# Patient Record
Sex: Female | Born: 1980 | Race: White | Hispanic: No | Marital: Married | Smoking: Current some day smoker
Health system: Southern US, Community
[De-identification: ages and names within clinical notes are randomized; demographics above are authoritative.]

## PROBLEM LIST (undated history)

## (undated) ENCOUNTER — Inpatient Hospital Stay (HOSPITAL_COMMUNITY): Payer: Self-pay

## (undated) DIAGNOSIS — R32 Unspecified urinary incontinence: Secondary | ICD-10-CM

## (undated) DIAGNOSIS — L409 Psoriasis, unspecified: Secondary | ICD-10-CM

## (undated) DIAGNOSIS — Z8619 Personal history of other infectious and parasitic diseases: Secondary | ICD-10-CM

## (undated) DIAGNOSIS — R4586 Emotional lability: Secondary | ICD-10-CM

## (undated) DIAGNOSIS — R351 Nocturia: Secondary | ICD-10-CM

## (undated) DIAGNOSIS — M1990 Unspecified osteoarthritis, unspecified site: Secondary | ICD-10-CM

## (undated) DIAGNOSIS — S83241A Other tear of medial meniscus, current injury, right knee, initial encounter: Secondary | ICD-10-CM

## (undated) DIAGNOSIS — M4307 Spondylolysis, lumbosacral region: Secondary | ICD-10-CM

## (undated) DIAGNOSIS — Z87448 Personal history of other diseases of urinary system: Secondary | ICD-10-CM

## (undated) DIAGNOSIS — R519 Headache, unspecified: Secondary | ICD-10-CM

## (undated) DIAGNOSIS — E1142 Type 2 diabetes mellitus with diabetic polyneuropathy: Secondary | ICD-10-CM

## (undated) DIAGNOSIS — H4710 Unspecified papilledema: Secondary | ICD-10-CM

## (undated) DIAGNOSIS — D171 Benign lipomatous neoplasm of skin and subcutaneous tissue of trunk: Secondary | ICD-10-CM

## (undated) DIAGNOSIS — E119 Type 2 diabetes mellitus without complications: Secondary | ICD-10-CM

## (undated) DIAGNOSIS — K219 Gastro-esophageal reflux disease without esophagitis: Secondary | ICD-10-CM

## (undated) DIAGNOSIS — F419 Anxiety disorder, unspecified: Secondary | ICD-10-CM

## (undated) DIAGNOSIS — R1031 Right lower quadrant pain: Secondary | ICD-10-CM

## (undated) DIAGNOSIS — R51 Headache: Secondary | ICD-10-CM

## (undated) DIAGNOSIS — I10 Essential (primary) hypertension: Secondary | ICD-10-CM

## (undated) DIAGNOSIS — Z8614 Personal history of Methicillin resistant Staphylococcus aureus infection: Secondary | ICD-10-CM

## (undated) HISTORY — PX: TUBAL LIGATION: SHX77

## (undated) HISTORY — DX: Psoriasis, unspecified: L40.9

## (undated) HISTORY — PX: WISDOM TOOTH EXTRACTION: SHX21

## (undated) SURGERY — Surgical Case
Anesthesia: *Unknown

---
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
## 2001-06-08 ENCOUNTER — Encounter: Payer: Self-pay | Admitting: Emergency Medicine

## 2001-06-08 ENCOUNTER — Emergency Department (HOSPITAL_COMMUNITY): Admission: EM | Admit: 2001-06-08 | Discharge: 2001-06-08 | Payer: Self-pay | Admitting: Emergency Medicine

## 2001-06-09 ENCOUNTER — Encounter: Payer: Self-pay | Admitting: Emergency Medicine

## 2001-06-09 ENCOUNTER — Emergency Department (HOSPITAL_COMMUNITY): Admission: EM | Admit: 2001-06-09 | Discharge: 2001-06-09 | Payer: Self-pay | Admitting: *Deleted

## 2001-08-29 ENCOUNTER — Emergency Department (HOSPITAL_COMMUNITY): Admission: EM | Admit: 2001-08-29 | Discharge: 2001-08-29 | Payer: Self-pay | Admitting: Emergency Medicine

## 2002-03-12 ENCOUNTER — Encounter: Payer: Self-pay | Admitting: Emergency Medicine

## 2002-03-12 ENCOUNTER — Emergency Department (HOSPITAL_COMMUNITY): Admission: EM | Admit: 2002-03-12 | Discharge: 2002-03-12 | Payer: Self-pay | Admitting: Emergency Medicine

## 2002-05-26 ENCOUNTER — Emergency Department (HOSPITAL_COMMUNITY): Admission: EM | Admit: 2002-05-26 | Discharge: 2002-05-26 | Payer: Self-pay | Admitting: Emergency Medicine

## 2002-08-30 ENCOUNTER — Emergency Department (HOSPITAL_COMMUNITY): Admission: EM | Admit: 2002-08-30 | Discharge: 2002-08-30 | Payer: Self-pay | Admitting: Emergency Medicine

## 2002-08-30 ENCOUNTER — Encounter: Payer: Self-pay | Admitting: Emergency Medicine

## 2002-09-16 ENCOUNTER — Emergency Department (HOSPITAL_COMMUNITY): Admission: EM | Admit: 2002-09-16 | Discharge: 2002-09-16 | Payer: Self-pay | Admitting: Emergency Medicine

## 2002-11-24 ENCOUNTER — Emergency Department (HOSPITAL_COMMUNITY): Admission: EM | Admit: 2002-11-24 | Discharge: 2002-11-24 | Payer: Self-pay | Admitting: Emergency Medicine

## 2002-12-16 ENCOUNTER — Emergency Department (HOSPITAL_COMMUNITY): Admission: EM | Admit: 2002-12-16 | Discharge: 2002-12-17 | Payer: Self-pay | Admitting: Emergency Medicine

## 2002-12-17 ENCOUNTER — Encounter: Payer: Self-pay | Admitting: Emergency Medicine

## 2003-03-07 ENCOUNTER — Encounter: Payer: Self-pay | Admitting: Emergency Medicine

## 2003-03-07 ENCOUNTER — Emergency Department (HOSPITAL_COMMUNITY): Admission: EM | Admit: 2003-03-07 | Discharge: 2003-03-07 | Payer: Self-pay | Admitting: Emergency Medicine

## 2003-03-11 ENCOUNTER — Emergency Department (HOSPITAL_COMMUNITY): Admission: EM | Admit: 2003-03-11 | Discharge: 2003-03-11 | Payer: Self-pay | Admitting: Emergency Medicine

## 2004-04-27 ENCOUNTER — Other Ambulatory Visit: Admission: RE | Admit: 2004-04-27 | Discharge: 2004-04-27 | Payer: Self-pay | Admitting: Obstetrics and Gynecology

## 2004-04-28 ENCOUNTER — Encounter: Admission: RE | Admit: 2004-04-28 | Discharge: 2004-04-28 | Payer: Self-pay | Admitting: Obstetrics and Gynecology

## 2004-08-01 ENCOUNTER — Emergency Department (HOSPITAL_COMMUNITY): Admission: EM | Admit: 2004-08-01 | Discharge: 2004-08-02 | Payer: Self-pay | Admitting: Emergency Medicine

## 2004-08-11 ENCOUNTER — Emergency Department (HOSPITAL_COMMUNITY): Admission: EM | Admit: 2004-08-11 | Discharge: 2004-08-11 | Payer: Self-pay | Admitting: Emergency Medicine

## 2005-04-17 ENCOUNTER — Emergency Department (HOSPITAL_COMMUNITY): Admission: EM | Admit: 2005-04-17 | Discharge: 2005-04-17 | Payer: Self-pay | Admitting: Emergency Medicine

## 2005-09-17 ENCOUNTER — Other Ambulatory Visit: Admission: RE | Admit: 2005-09-17 | Discharge: 2005-09-17 | Payer: Self-pay | Admitting: Obstetrics and Gynecology

## 2005-12-10 ENCOUNTER — Inpatient Hospital Stay (HOSPITAL_COMMUNITY): Admission: AD | Admit: 2005-12-10 | Discharge: 2005-12-11 | Payer: Self-pay | Admitting: Family Medicine

## 2005-12-10 ENCOUNTER — Inpatient Hospital Stay (HOSPITAL_COMMUNITY): Admission: AD | Admit: 2005-12-10 | Discharge: 2005-12-10 | Payer: Self-pay | Admitting: *Deleted

## 2005-12-12 ENCOUNTER — Inpatient Hospital Stay (HOSPITAL_COMMUNITY): Admission: AD | Admit: 2005-12-12 | Discharge: 2005-12-12 | Payer: Self-pay | Admitting: Obstetrics and Gynecology

## 2006-02-12 ENCOUNTER — Emergency Department (HOSPITAL_COMMUNITY): Admission: EM | Admit: 2006-02-12 | Discharge: 2006-02-12 | Payer: Self-pay | Admitting: Family Medicine

## 2007-01-13 ENCOUNTER — Emergency Department (HOSPITAL_COMMUNITY): Admission: EM | Admit: 2007-01-13 | Discharge: 2007-01-14 | Payer: Self-pay | Admitting: Emergency Medicine

## 2007-04-04 ENCOUNTER — Emergency Department (HOSPITAL_COMMUNITY): Admission: EM | Admit: 2007-04-04 | Discharge: 2007-04-04 | Payer: Self-pay | Admitting: Emergency Medicine

## 2007-04-08 ENCOUNTER — Emergency Department (HOSPITAL_COMMUNITY): Admission: EM | Admit: 2007-04-08 | Discharge: 2007-04-08 | Payer: Self-pay | Admitting: *Deleted

## 2007-10-21 ENCOUNTER — Inpatient Hospital Stay (HOSPITAL_COMMUNITY): Admission: EM | Admit: 2007-10-21 | Discharge: 2007-10-23 | Payer: Self-pay | Admitting: Emergency Medicine

## 2007-11-09 ENCOUNTER — Inpatient Hospital Stay (HOSPITAL_COMMUNITY): Admission: RE | Admit: 2007-11-09 | Discharge: 2007-11-09 | Payer: Self-pay | Admitting: Family Medicine

## 2007-12-18 ENCOUNTER — Inpatient Hospital Stay (HOSPITAL_COMMUNITY): Admission: AD | Admit: 2007-12-18 | Discharge: 2007-12-18 | Payer: Self-pay | Admitting: Obstetrics & Gynecology

## 2007-12-20 ENCOUNTER — Ambulatory Visit (HOSPITAL_COMMUNITY): Admission: RE | Admit: 2007-12-20 | Discharge: 2007-12-20 | Payer: Self-pay | Admitting: Family Medicine

## 2007-12-21 ENCOUNTER — Ambulatory Visit (HOSPITAL_COMMUNITY): Admission: RE | Admit: 2007-12-21 | Discharge: 2007-12-21 | Payer: Self-pay | Admitting: Family Medicine

## 2008-01-18 ENCOUNTER — Ambulatory Visit (HOSPITAL_COMMUNITY): Admission: RE | Admit: 2008-01-18 | Discharge: 2008-01-18 | Payer: Self-pay | Admitting: Family Medicine

## 2008-02-01 ENCOUNTER — Ambulatory Visit (HOSPITAL_COMMUNITY): Admission: RE | Admit: 2008-02-01 | Discharge: 2008-02-01 | Payer: Self-pay | Admitting: Family Medicine

## 2008-03-14 ENCOUNTER — Ambulatory Visit (HOSPITAL_COMMUNITY): Admission: RE | Admit: 2008-03-14 | Discharge: 2008-03-14 | Payer: Self-pay | Admitting: Family Medicine

## 2008-03-30 ENCOUNTER — Inpatient Hospital Stay (HOSPITAL_COMMUNITY): Admission: AD | Admit: 2008-03-30 | Discharge: 2008-03-30 | Payer: Self-pay | Admitting: Obstetrics & Gynecology

## 2008-03-30 ENCOUNTER — Ambulatory Visit: Payer: Self-pay | Admitting: Advanced Practice Midwife

## 2008-04-15 ENCOUNTER — Ambulatory Visit (HOSPITAL_COMMUNITY): Admission: RE | Admit: 2008-04-15 | Discharge: 2008-04-15 | Payer: Self-pay | Admitting: Obstetrics & Gynecology

## 2008-05-29 ENCOUNTER — Inpatient Hospital Stay (HOSPITAL_COMMUNITY): Admission: AD | Admit: 2008-05-29 | Discharge: 2008-05-29 | Payer: Self-pay | Admitting: Family Medicine

## 2008-05-29 ENCOUNTER — Encounter: Payer: Self-pay | Admitting: Family Medicine

## 2008-05-29 ENCOUNTER — Ambulatory Visit: Payer: Self-pay | Admitting: Surgery

## 2008-06-21 ENCOUNTER — Ambulatory Visit: Payer: Self-pay | Admitting: Obstetrics & Gynecology

## 2008-06-21 ENCOUNTER — Inpatient Hospital Stay (HOSPITAL_COMMUNITY): Admission: AD | Admit: 2008-06-21 | Discharge: 2008-06-24 | Payer: Self-pay | Admitting: Family Medicine

## 2008-06-21 ENCOUNTER — Encounter: Payer: Self-pay | Admitting: Obstetrics & Gynecology

## 2008-07-08 ENCOUNTER — Inpatient Hospital Stay (HOSPITAL_COMMUNITY): Admission: EM | Admit: 2008-07-08 | Discharge: 2008-07-11 | Payer: Self-pay | Admitting: Emergency Medicine

## 2008-07-10 ENCOUNTER — Encounter (INDEPENDENT_AMBULATORY_CARE_PROVIDER_SITE_OTHER): Payer: Self-pay | Admitting: *Deleted

## 2008-07-10 HISTORY — PX: CHOLECYSTECTOMY: SHX55

## 2009-12-26 ENCOUNTER — Inpatient Hospital Stay (HOSPITAL_COMMUNITY): Admission: AD | Admit: 2009-12-26 | Discharge: 2009-12-26 | Payer: Self-pay | Admitting: Obstetrics & Gynecology

## 2009-12-28 ENCOUNTER — Ambulatory Visit (HOSPITAL_COMMUNITY): Admission: AD | Admit: 2009-12-28 | Discharge: 2009-12-28 | Payer: Self-pay | Admitting: Obstetrics and Gynecology

## 2010-01-05 ENCOUNTER — Ambulatory Visit (HOSPITAL_COMMUNITY): Admission: RE | Admit: 2010-01-05 | Discharge: 2010-01-05 | Payer: Self-pay | Admitting: Obstetrics and Gynecology

## 2010-02-17 ENCOUNTER — Inpatient Hospital Stay (HOSPITAL_COMMUNITY): Admission: AD | Admit: 2010-02-17 | Discharge: 2010-02-17 | Payer: Self-pay | Admitting: Obstetrics and Gynecology

## 2010-03-28 ENCOUNTER — Inpatient Hospital Stay (HOSPITAL_COMMUNITY): Admission: AD | Admit: 2010-03-28 | Discharge: 2010-03-28 | Payer: Self-pay | Admitting: Obstetrics and Gynecology

## 2010-07-04 ENCOUNTER — Inpatient Hospital Stay (HOSPITAL_COMMUNITY): Admission: AD | Admit: 2010-07-04 | Discharge: 2010-07-04 | Payer: Self-pay | Admitting: Obstetrics & Gynecology

## 2010-08-18 ENCOUNTER — Inpatient Hospital Stay (HOSPITAL_COMMUNITY): Admission: RE | Admit: 2010-08-18 | Discharge: 2010-08-21 | Payer: Self-pay | Admitting: Obstetrics and Gynecology

## 2011-01-13 LAB — COMPREHENSIVE METABOLIC PANEL
ALT: 12 U/L (ref 0–35)
AST: 16 U/L (ref 0–37)
Albumin: 2.4 g/dL — ABNORMAL LOW (ref 3.5–5.2)
Alkaline Phosphatase: 72 U/L (ref 39–117)
BUN: 4 mg/dL — ABNORMAL LOW (ref 6–23)
CO2: 21 mEq/L (ref 19–32)
Calcium: 7.8 mg/dL — ABNORMAL LOW (ref 8.4–10.5)
Chloride: 106 mEq/L (ref 96–112)
Creatinine, Ser: 0.39 mg/dL — ABNORMAL LOW (ref 0.4–1.2)
GFR calc Af Amer: >60 mL/min (ref >60)
GFR calc non Af Amer: >60 mL/min (ref >60)
Glucose, Bld: 85 mg/dL (ref 70–99)
Potassium: 3.5 mEq/L (ref 3.5–5.1)
Sodium: 132 mEq/L — ABNORMAL LOW (ref 135–145)
Total Bilirubin: 0.2 mg/dL — ABNORMAL LOW (ref 0.3–1.2)
Total Protein: 5.3 g/dL — ABNORMAL LOW (ref 6.0–8.3)

## 2011-01-13 LAB — CBC
HCT: 25.3 % — ABNORMAL LOW (ref 36.0–46.0)
HCT: 29.4 % — ABNORMAL LOW (ref 36.0–46.0)
Hemoglobin: 8.5 g/dL — ABNORMAL LOW (ref 12.0–15.0)
Hemoglobin: 9.8 g/dL — ABNORMAL LOW (ref 12.0–15.0)
MCH: 28.7 pg (ref 26.0–34.0)
MCH: 29.1 pg (ref 26.0–34.0)
MCHC: 33.2 g/dL (ref 30.0–36.0)
MCHC: 33.4 g/dL (ref 30.0–36.0)
MCV: 86.5 fL (ref 78.0–100.0)
MCV: 87.2 fL (ref 78.0–100.0)
Platelets: 225 10*3/uL (ref 150–400)
Platelets: 236 10*3/uL (ref 150–400)
RBC: 2.9 MIL/uL — ABNORMAL LOW (ref 3.87–5.11)
RBC: 3.4 MIL/uL — ABNORMAL LOW (ref 3.87–5.11)
RDW: 14.8 % (ref 11.5–15.5)
RDW: 15.4 % (ref 11.5–15.5)
WBC: 11.1 10*3/uL — ABNORMAL HIGH (ref 4.0–10.5)
WBC: 14.8 10*3/uL — ABNORMAL HIGH (ref 4.0–10.5)

## 2011-01-13 LAB — URIC ACID: Uric Acid, Serum: 3.7 mg/dL (ref 2.4–7.0)

## 2011-01-13 LAB — LACTATE DEHYDROGENASE: LDH: 100 U/L (ref 94–250)

## 2011-01-14 LAB — SURGICAL PCR SCREEN
MRSA, PCR: NEGATIVE
Staphylococcus aureus: NEGATIVE

## 2011-01-14 LAB — CBC
HCT: 31.2 % — ABNORMAL LOW (ref 36.0–46.0)
Hemoglobin: 10.6 g/dL — ABNORMAL LOW (ref 12.0–15.0)
MCH: 29.4 pg (ref 26.0–34.0)
MCHC: 33.9 g/dL (ref 30.0–36.0)
MCV: 86.8 fL (ref 78.0–100.0)
Platelets: 254 10*3/uL (ref 150–400)
RBC: 3.59 MIL/uL — ABNORMAL LOW (ref 3.87–5.11)
RDW: 14.9 % (ref 11.5–15.5)
WBC: 10.1 10*3/uL (ref 4.0–10.5)

## 2011-01-14 LAB — RPR: RPR Ser Ql: NONREACTIVE

## 2011-01-18 LAB — URINALYSIS, ROUTINE W REFLEX MICROSCOPIC
Bilirubin Urine: NEGATIVE
Glucose, UA: NEGATIVE mg/dL
Hgb urine dipstick: NEGATIVE
Ketones, ur: NEGATIVE mg/dL
Nitrite: NEGATIVE
Protein, ur: NEGATIVE mg/dL
Specific Gravity, Urine: >1.03 — ABNORMAL HIGH (ref 1.005–1.030)
Urobilinogen, UA: 0.2 mg/dL (ref 0.0–1.0)
pH: 5.5 (ref 5.0–8.0)

## 2011-01-18 LAB — WET PREP, GENITAL
Clue Cells Wet Prep HPF POC: NONE SEEN
Trich, Wet Prep: NONE SEEN
Yeast Wet Prep HPF POC: NONE SEEN

## 2011-01-19 LAB — URINALYSIS, ROUTINE W REFLEX MICROSCOPIC
Bilirubin Urine: NEGATIVE
Glucose, UA: NEGATIVE mg/dL
Hgb urine dipstick: NEGATIVE
Ketones, ur: NEGATIVE mg/dL
Nitrite: NEGATIVE
Protein, ur: NEGATIVE mg/dL
Specific Gravity, Urine: 1.025 (ref 1.005–1.030)
Urobilinogen, UA: 0.2 mg/dL (ref 0.0–1.0)
pH: 6 (ref 5.0–8.0)

## 2011-01-19 LAB — GC/CHLAMYDIA PROBE AMP, GENITAL
Chlamydia, DNA Probe: NEGATIVE
GC Probe Amp, Genital: NEGATIVE

## 2011-01-19 LAB — WET PREP, GENITAL
Clue Cells Wet Prep HPF POC: NONE SEEN
Trich, Wet Prep: NONE SEEN
Yeast Wet Prep HPF POC: NONE SEEN

## 2011-01-19 LAB — CBC
HCT: 38.1 % (ref 36.0–46.0)
Hemoglobin: 12.8 g/dL (ref 12.0–15.0)
MCHC: 33.7 g/dL (ref 30.0–36.0)
MCV: 88.1 fL (ref 78.0–100.0)
Platelets: 246 10*3/uL (ref 150–400)
RBC: 4.32 MIL/uL (ref 3.87–5.11)
RDW: 14.3 % (ref 11.5–15.5)
WBC: 10.5 10*3/uL (ref 4.0–10.5)

## 2011-01-19 LAB — COMPREHENSIVE METABOLIC PANEL
ALT: 15 U/L (ref 0–35)
AST: 17 U/L (ref 0–37)
Albumin: 3.5 g/dL (ref 3.5–5.2)
Alkaline Phosphatase: 34 U/L — ABNORMAL LOW (ref 39–117)
BUN: 3 mg/dL — ABNORMAL LOW (ref 6–23)
CO2: 23 mEq/L (ref 19–32)
Calcium: 9.2 mg/dL (ref 8.4–10.5)
Chloride: 104 mEq/L (ref 96–112)
Creatinine, Ser: 0.4 mg/dL (ref 0.4–1.2)
GFR calc Af Amer: >60 mL/min (ref >60)
GFR calc non Af Amer: >60 mL/min (ref >60)
Glucose, Bld: 83 mg/dL (ref 70–99)
Potassium: 3.7 mEq/L (ref 3.5–5.1)
Sodium: 135 mEq/L (ref 135–145)
Total Bilirubin: 0.3 mg/dL (ref 0.3–1.2)
Total Protein: 6.6 g/dL (ref 6.0–8.3)

## 2011-01-19 LAB — URINE MICROSCOPIC-ADD ON

## 2011-01-19 LAB — URINE CULTURE

## 2011-01-20 LAB — URINALYSIS, ROUTINE W REFLEX MICROSCOPIC
Bilirubin Urine: NEGATIVE
Glucose, UA: NEGATIVE mg/dL
Hgb urine dipstick: NEGATIVE
Ketones, ur: NEGATIVE mg/dL
Nitrite: NEGATIVE
Protein, ur: NEGATIVE mg/dL
Specific Gravity, Urine: 1.015 (ref 1.005–1.030)
Urobilinogen, UA: 0.2 mg/dL (ref 0.0–1.0)
pH: 7.5 (ref 5.0–8.0)

## 2011-01-20 LAB — CBC
HCT: 42.5 % (ref 36.0–46.0)
Hemoglobin: 14.2 g/dL (ref 12.0–15.0)
MCHC: 33.3 g/dL (ref 30.0–36.0)
MCV: 86.7 fL (ref 78.0–100.0)
Platelets: 278 10*3/uL (ref 150–400)
RBC: 4.91 MIL/uL (ref 3.87–5.11)
RDW: 14.1 % (ref 11.5–15.5)
WBC: 11.9 10*3/uL — ABNORMAL HIGH (ref 4.0–10.5)

## 2011-01-20 LAB — HCG, QUANTITATIVE, PREGNANCY
hCG, Beta Chain, Quant, S: 5267 m[IU]/mL — ABNORMAL HIGH (ref <5)
hCG, Beta Chain, Quant, S: 8256 m[IU]/mL — ABNORMAL HIGH (ref <5)

## 2011-01-20 LAB — WET PREP, GENITAL
Clue Cells Wet Prep HPF POC: NONE SEEN
Trich, Wet Prep: NONE SEEN
Yeast Wet Prep HPF POC: NONE SEEN

## 2011-01-20 LAB — GC/CHLAMYDIA PROBE AMP, GENITAL
Chlamydia, DNA Probe: NEGATIVE
GC Probe Amp, Genital: NEGATIVE

## 2011-01-20 LAB — POCT PREGNANCY, URINE: Preg Test, Ur: POSITIVE

## 2011-03-16 NOTE — Discharge Summary (Signed)
[name], [name]                ACCOUNT NO.:  [id number]   MEDICAL RECORD NO.:  [id number]          PATIENT TYPE:  INP   LOCATION:  5122                         FACILITY:  [facility]   PHYSICIAN:  Alfonse Ras, MD   DATE OF BIRTH:  03-01-1981   DATE OF ADMISSION:  07/08/2008  DATE OF DISCHARGE:  07/11/2008                               DISCHARGE SUMMARY   ADMITTING PHYSICIAN:  Wilmon Arms. Corliss Skains, MD   DISCHARGING PHYSICIAN:  Alfonse Ras, MD   PROCEDURES:  On July 09, 2008, the patient had an endoscopic  retrograde cholangiopancreatography done by Dr. Ewing Schlein where a stent was  placed.  On July 10, 2008, Dr. Colin Benton did a laparoscopic  cholecystectomy, and then on July 11, 2008, the patient had another  endoscopic retrograde cholangiopancreatography done by Dr. Matthias Hughs where  the pancreatic stent was removed.   CONSULTANTS:  [facility], Petra Kuba, MD/Robert Buccini,  MD   REASON FOR ADMISSION:  [name] is a 30 year old female who is  currently 3 weeks status post C-section on June 21, 2008.  Apparently,  this morning, she woke up with pain in her epigastric region and  radiating to her back.  At this time, she denies any nausea or vomiting.  However, she states that she has had diarrhea since the C-section.  Otherwise, she states that she does not have any fever; however, she  does state that she has had a similar episode like this in December  2008, for which she was evaluated by Dr. Zachery Dakins and at that time, had  a negative HIDA scan.  At this time, we were called to see the patient  in the emergency department for acute cholecystitis.  Please see  admitting history and physical for further details.   ADMITTING DIAGNOSES:  1. Acute cholecystitis.  2. Status post recent cesarean section.   HOSPITAL COURSE:  On hospital day #1, the patient was feeling okay with  less pain.  At this time, she was currently NPO.  Her laboratory values  at  this time had increased from the time she was in the ER with her AST,  ALT, and alkaline phosphatase increasing as well as her total bilirubin,  which had increased from 0.5 to 1.5.  At this time, there was a concern  that the patient may have a stone in her duct since her ultrasound of  the abdomen the night prior did show a somewhat dilated common bile  duct.  Therefore, at this time, we called [facility] to  assess the patient and take her for an ERCP prior to taking the  gallbladder out.  This was done on the same day, and no stone was found;  however, they did place a stent just in case.  On the following day, the  patient was taken down for a laparoscopic cholecystectomy, which she  tolerated well.  That night, she was tolerating her diet; however, she  was made NPO after 6 a.m. on postoperative day #1 for another endoscopy  to remove pancreatic stent.  This ERCP was completed with no  problems,  and the patient tolerated the procedure well.  Otherwise, the patient  was stable for discharge once she was alert and once again tolerating a  regular diet.   DISCHARGE DIAGNOSES:  1. Acute cholecystitis.  2. Cholelithiasis.  3. Status post endoscopic retrograde cholangiopancreatography with      stent placement.  4. Status post endoscopic retrograde cholangiopancreatography with      stent removal.  5. Status post laparoscopic cholecystectomy.  6. Status post cesarean section.   DISCHARGE MEDICATIONS:  The patient is informed that she can resume all  prehospital medications, which include Tums p.r.n.  Otherwise, she is  given a prescription for Percocet 1-2 q.4 h. as needed for pain.  She is  also informed that she is to not take any NSAIDs, Goody's Powders,  aspirin, Motrin, Advil orally for the next 2 weeks status post her ERCP.   DISCHARGE INSTRUCTIONS:  The patient is informed that she may return to  work in 2 weeks.  She is also informed that she needs to resume a  low-  fat diet.  Otherwise, she is told to increase her activity slowly, and  she may walk up steps.  She also may shower; however, she is not to  bathe for the next 2 weeks, and she is also not to lift anything greater  than 15 pounds for the next 2 weeks.  Otherwise, she may remove her tape  and her gauze tomorrow from her incision.  She is to call [facility] office if she develops fever greater than 101.5, new or  worsening abdominal pain, or redness or pus-like drainage from her  incision.  She is also informed to discard any of her breast milk while  taking narcotic pain medicines.  Also, as previously stated above, per  Gastroenterology, she is not to take aspirin, Goody's Powders, BC  Powders, Advil, Aleve, ibuprofen, or Motrin for the next 2 weeks.  Otherwise, she is told to return to the [facility] on July 23, 2008, at 2:30 for a followup appointment from a  laparoscopic cholecystectomy.  Otherwise, she is to follow up with Dr.  Ewing Schlein or Dr. Matthias Hughs as needed if she continues to have the following  problems.      Letha Cape, PA      Alfonse Ras, MD  Electronically Signed    KEO/MEDQ  D:  07/11/2008  T:  07/12/2008  Job:  [id number]   cc:   Bernette Redbird, M.D.  Petra Kuba, M.D.

## 2011-03-16 NOTE — H&P (Signed)
[name]                ACCOUNT NO.:  [id number]   MEDICAL RECORD NO.:  [id number]          PATIENT TYPE:  INP   LOCATION:  1302                         FACILITY:  [facility]   PHYSICIAN:  Della Goo, M.D. DATE OF BIRTH:  [birth year]   DATE OF ADMISSION:  10/20/2007  DATE OF DISCHARGE:                              HISTORY & PHYSICAL   PRIMARY CARE PHYSICIAN:  Unassigned.   CHIEF COMPLAINT:  Abdominal pain.   HISTORY OF PRESENT ILLNESS:  This is a 30 year old female who presents  to the emergency department with complaints of 2 days of right upper  quadrant abdominal pain and nausea and vomiting which started in the  past 24 hours.  The patient reports not being able to hold down any p.o.  liquids or foods..  She denies having any hematemesis, and reports that  her emesis has been clear to white and not bilious in description.  She  denies having any fevers or chills.  Her last BM was 1 day ago.  She  denies having any diarrhea or constipation.  The pain has been in the  right upper quadrant area, and she rated the pain as being an 8/10 at  the worst.  The pain has been intermittent and crampy.  She denies  having any previous similar episodes to this.   PAST MEDICAL HISTORY:  Past history of asthma.   PAST SURGICAL HISTORY:  None.   MEDICATIONS:  None at this time.   ALLERGIES:  PENICILLIN.   SOCIAL HISTORY:  The patient works at [employer].  She is a smoker.  She is a nondrinker.  She denies any illicit drug usage.   FAMILY HISTORY:  No coronary artery disease, hypertension, diabetes, or  cancer that she knows of.   REVIEW OF SYSTEMS:  Pertinents are mentioned above.  The patient has had  no fevers, chills, chest pain, or shortness of breath.   PHYSICAL EXAMINATION FINDINGS:  GENERAL:  This is a 30 year old  overweight female in discomfort but no acute distress.  VITAL SIGNS:  Temperature 98.8, blood pressure 151/79, heart rate 102,  respirations 18, and  O2 saturations 98% on room air.  HEENT:  Normocephalic, atraumatic.  Pupils equally round, reactive to  light.  There is no scleral icterus.  Funduscopic benign.  Oropharynx is  clear.  NECK:  Supple.  Full range of motion.  No thyromegaly, adenopathy, or  jugular venous distention.  CARDIOVASCULAR:  Mild tachycardia.  No murmurs, gallops, or rubs.  LUNGS:  Clear to auscultation bilaterally.  ABDOMEN:  Positive bowel sounds.  Soft, nontender, nondistended.  EXTREMITIES:  Without cyanosis, clubbing, or edema.  NEUROLOGIC:  Alert and oriented x3.  There are no focal deficits.   LABORATORY STUDIES:  White blood cell count 11, hemoglobin 13.3,  hematocrit 38.8, MCV 82.6, platelets 314, neutrophils 66%, lymphocytes  24%.  Chemistry with a sodium of 139, potassium 3.8, chloride 107, CO2  26, BUN 13, creatinine 0.56, and glucose 115, total bilirubin 0.5,  alkaline phosphatase 56, AST 21, ALT 26.  Urine HCG negative.  Urinalysis negative, specific gravity 1.031.  Lipase 21.  Abdominal  ultrasound study performed and negative.  The findings reveal increased  echogenicity, without focal mass or intrahepatic ductal dilatation,  normal gallbladder, normal intrahepatic inferior vena cava, spleen,  kidneys, and abdominal aorta.  Pancreas was obscured by bowel gas.  The  impression was fatty liver, and otherwise in no acute findings seen.   ASSESSMENT:  A 30 year old female being admitted with:  1. Right upper quadrant abdominal pain.  2. Nausea and vomiting.  3. Tobacco abuse.  4. Fatty liver.   PLAN:  The patient will be placed on a clear liquid diet, and IV fluids  have been ordered.  Antiemetic therapy has been ordered as needed along  with pain control therapy.  A nicotine patch has also been ordered, and  the patient will be placed on DVT and GI prophylaxis.      Della Goo, M.D.  Electronically Signed     HJ/MEDQ  D:  10/21/2007  T:  10/23/2007  Job:  [id number]

## 2011-03-16 NOTE — Op Note (Signed)
NAMEWEN, [name]                ACCOUNT NO.:  [id number]   MEDICAL RECORD NO.:  [id number]          PATIENT TYPE:  INP   LOCATION:  5122                         FACILITY:  [facility]   PHYSICIAN:  Alfonse Ras, MD   DATE OF BIRTH:  [birth year]   DATE OF PROCEDURE:  07/10/2008  DATE OF DISCHARGE:                               OPERATIVE REPORT   PREOPERATIVE DIAGNOSIS:  Acute cholecystitis, status post endoscopic  retrograde cholangiopancreatography.   POSTOPERATIVE DIAGNOSIS:  Acute cholecystitis, status post endoscopic  retrograde cholangiopancreatography.   PROCEDURE:  Laparoscopic cholecystectomy.   SURGEON:  Alfonse Ras, MD   ASSISTANT:  Letha Cape, PA   ANESTHESIA:  General.   DESCRIPTION:  The patient was taken to the operating room and placed in  supine position.  After adequate general anesthesia was induced using  endotracheal tube, the abdomen was prepped and draped in a normal  sterile fashion.  Using a transverse infraumbilical incision, I dissect  down the fascia.  Fascia was opened vertically.  An 0 Vicryl purse-  string suture was placed on the fascial defect and Hasson trocar was  placed in the abdomen.  Pneumoperitoneum was obtained.  Under direct  vision, an 11 mm trocar was placed in subxiphoid region.  Two 5-mm  trocars were placed in the right abdomen.  Gallbladder was identified  and was quite distended, therefore it was aspirated.  It was retracted  cephalad.  Dissection at the neck of the gallbladder produced critical  view of the cystic duct this junction with common duct and gallbladder.  It was triply clipped and divided.  Cystic artery was identified with  critical view in a similar fashion, triply clipped and divided.  Gallbladder was taken off the gallbladder bed using Bovie electrocautery  and placed in an Endocatch bag.  It was removed through the umbilical  port.  Adequate hemostasis was assured.  The right upper quadrant was  copiously irrigated.  The infraumbilical fascial defect was closed with  0 Vicryl purse-string suture.  Pneumoperitoneum was released.  Trocars  were removed and skin incisions were closed with subcuticular 4-0  Monocryl.  Steri-Strips and sterile dressings were applied.  The patient  tolerated the procedure well and went to PACU in good condition.      Alfonse Ras, MD  Electronically Signed     KRE/MEDQ  D:  07/10/2008  T:  07/11/2008  Job:  [id number]

## 2011-03-16 NOTE — Op Note (Signed)
Courtney, Andrade                ACCOUNT NO.:  [id number]   MEDICAL RECORD NO.:  [id number]          PATIENT TYPE:  INP   LOCATION:  9372                          FACILITY:  [facility]   PHYSICIAN:  Lesly Dukes, M.D. DATE OF BIRTH:  09-13-1981   DATE OF PROCEDURE:  06/21/2008  DATE OF DISCHARGE:                               OPERATIVE REPORT   PREOPERATIVE DIAGNOSES:  65. A 30 year old gravida 6, para 0-0-5-0 at 36 and 3/7th weeks      gestational age with elevated blood pressures, oligohydramnios.  2. Breech presentation.   POSTOPERATIVE DIAGNOSES:  63. A 30 year old gravida 6, para 0-0-5-0 at 52 and 3/7th weeks      gestational age with elevated blood pressures, oligohydramnios.  2. Breech presentation.   PROCEDURE:  Primary low transverse C-section.   SURGEON:  Lesly Dukes, MD   ASSISTANT:  Odie Sera, DO   ANESTHESIA:  Spinal.   INDICATIONS FOR PROCEDURE:  [name] is a 30 year old gravida 6,  para 0-0-5-0 who at 72 and 3/7th weeks was noted to have elevated blood  pressures.  Upon further evaluation, she was noted to have  oligohydramnios as well as a breech presentation.  Due to the blood  pressures and oligo, she was advised to proceed with delivery.  Given  the presentation of baby, a cesarean section was recommended.  The  patient was counseled on the risks of cesarean section to include, but  not limited to bleeding, infection, as well as damage to the internal  organs.  The patient voiced understanding of the risks and agreed to  proceed with cesarean section.   DESCRIPTION OF PROCEDURE:  The patient was taken to the operating room  where a spinal anesthesia was administered.  A time-out was conducted.  The patient was prepped and draped in the usual sterile fashion and  placed in the dorsal left supine position.  A vertical Pfannenstiel  incision was made with the scalpel and extended down to the fascia.  The  fascia was incised and the incision  was carried laterally with the  scissors.  Peritoneum was dissected off the fascia both above and below  in the usual manner.  The rectus muscles were bluntly separated.  The  peritoneum was tented and entered with Metzenbaum scissors.  The  peritoneal incision was extended bluntly.  A bladder flap was developed  in the usual manner and a bladder blade was placed.  A transverse  incision was made in the uterus through the layers until membranes were  seen through the incision.  Membranes were ruptured bluntly to reveal  thinly meconium-stained fluid.  The infant was found to be in the frank  breech position and delivered in the usual manner with head in flexion  without difficulties.  The infant was bulb suctioned upon delivery and  the cord was clamped and cut.  The infant was handed to the NICU team.  The infant had a spontaneous cry and no gross abnormalities.  The  placenta was manually extracted and appeared intact with three-vessel  cord.  Blood gases as  well as cord blood was sent to Pathology.  The  uterine incision was closed in the usual manner with running suture of 0-  Vicryl.  Additional layer of closure was performed in the lower uterine  segment with 2-0 Vicryl.  Hemostasis was confirmed.  The attention was  then brought to the fascia.  The fascia was closed in the usual manner  with 0-Vicryl in a non-interlocking running fashion.  Hemostasis again  was confirmed as well as intact fascia without any defects.  Skin was  closed with staples and a pressure dressing was applied.   FINDINGS:  1. Viable female infant in a breech presentation.  2. Thinly meconium-stained fluid.  3. Grossly normal-appearing ovaries bilaterally.   SPECIMENS:  Placenta sent to Pathology.   ESTIMATED BLOOD LOSS:  800 mL.   COMPLICATIONS:  None.   The patient tolerated the procedure well and was sent to the PACU in  good condition. `      Odie Sera, DO  Electronically Signed      ______________________________  Lesly Dukes, M.D.    MC/MEDQ  D:  06/21/2008  T:  06/22/2008  Job:  [id number]

## 2011-03-16 NOTE — Op Note (Signed)
NAMEJAYDY, [name]                ACCOUNT NO.:  [id number]   MEDICAL RECORD NO.:  [id number]          PATIENT TYPE:  INP   LOCATION:  5122                         FACILITY:  [facility]   PHYSICIAN:  Petra Kuba, M.D.    DATE OF BIRTH:  27-Mar-1981   DATE OF PROCEDURE:  07/09/2008  DATE OF DISCHARGE:                               OPERATIVE REPORT   PROCEDURE:  Endoscopic retrograde cholangiopancreatography,  sphincterotomy, and balloon pull-through pancreatic stent placement.   INDICATIONS:  The patient with questionable common bile duct (CBD)  stones.  Consent was signed after risks, benefits, methods, and options  thoroughly discussed by both myself, our PA Clerance Lav and Dr. Matthias Hughs.   PROCEDURE:  The video side-viewing therapeutic duodenoscope was inserted  by indirect vision into the stomach.  There was no resistance and  passing through her esophagus, advanced through a normal pylorus into  the duodenum, and a normal-appearing ampulla was brought into view.  She  did have a tiny distal diverticula.  Using the triple-lumen  sphincterotome loaded with  Jagwire guide wire, initially we could only  get PD wire advancements.  After a prolonged effort, we did go ahead and  try the smaller sphincterotome loaded with the smaller wire but again  did not have any further success.  We did seem to have better wire  advancements using the larger wire and again tried one more time.  The  wire was advanced moderately far out the PD.  Through the first part of  this procedure, there was minimal PD injection and no CBD filling.  We  went ahead and put a 4-French 2-cm plastic stent in the PD and then  tried to recannulate with her, first on her belly but were unsuccessful.  We then rolled her on her side and was able to get deep selective  cannulation using the sphincterotome and the wire.  The CBD was normal.  No obvious stones were seen.  We went, there was no cystic duct filling.  The  intrahepatics were normal.  They were not overfilled.  We went ahead  and proceeded with a medium-sized sphincterotomy in the customary  fashion over the wire with adequate biliary drainage and good dye  drainage.  We were able to get the fully bowed sphincterotome in and out  of the duct.  We then went ahead and exchanged the 12-15 mm adjustable  balloon for the sphincterotome and proceeded with four balloon pull-  throughs.  No stones, debris, sludge, or signs of cholangitis were seen.  The balloon did pass fairly readily without resistance through the  sphincterotomy site.  We did do an occlusion cholangiogram and no  obvious residual stones were seen.  We elected to stop the procedure at  this junction.  We did leave the PD stent in place to help decrease the  chance of pancreatitis.  The patient tolerated the procedure adequately.  There was no obvious immediate complications.   ENDOSCOPIC DIAGNOSES:  1. Normal ampulla.  2. Tiny periampullary distal diverticulum.  3. Tiny pancreatic duct on a few injections to the few  wire      advancements status post 4-French 2-cm plastic stent placed into      the pancreatic duct (PD).  4. Normal common bile duct without obvious stone, no cystic duct      filling, normal intrahepatic.  5. Medium sized sphincterotomy.  6. Negative for balloon pull-throughs using the adjustable 12-15 mm      balloon inflated to 12 mm and negative occlusion cholangiogram      without any obvious stone.   PLAN:  See how she does clinically.  If no complications, laparoscopic  cholecystectomy tomorrow.  We will repeat labs.  We would recommend a  repeat intrahepatic cholangiogram just to confirm no residual stones and  then in 2-3 days repeat EGD to remove the PD stent and better evaluate  her esophagus and see if the dilation is needed.  We will leave that up  to Dr. Matthias Hughs and hold the aspirin and nonsteroidals for 14 days, hold  breast-feeding for 24  hours.           ______________________________  Petra Kuba, M.D.     MEM/MEDQ  D:  07/09/2008  T:  07/10/2008  Job:  [id number]

## 2011-03-16 NOTE — Discharge Summary (Signed)
NAME:  [name], [name]                ACCOUNT NO.:  [id number]   MEDICAL RECORD NO.:  [id number]          PATIENT TYPE:  INP   LOCATION:  1302                         [facility]ACILITY:  [facility]   PHYSICIAN:  Hind I Elsaid, MD      DATE OF BIRTH:  1980-12-16   DATE OF ADMISSION:  10/20/2007  DATE OF DISCHARGE:  10/23/2007                               DISCHARGE SUMMARY   DISCHARGE DIAGNOSES:  1. Nonspecific right upper quadrant pain, resolved.  2. Nausea and vomiting, resolved.  3. Fatty liver.  4. Asthma.   DISCHARGE MEDICATIONS:  None.   PROCEDURES:  1. Ultrasound of the abdomen:  Fatty liver, no acute findings      otherwise.  2. Chest x-ray:  Normal.  3. CT abdomen without contrast:  Contracted gallbladder.  There is      mild pericholecystic inflammatory changes and enhancement of the      gallbladder wall such as cholecystitis, recommend clinical      correlation.  A 2.6-cm rim enhancing cyst associated with the right      ovary, adnexa, and free pelvic fluid.  No evidence of appendicitis      or other inflammatory change.  No evidence of appendicitis or other      inflammatory change, no pelvic mass.  4. HIDA scan:  Normal  visualization of the liver, bile duct, and      gallbladder, and small bowel, gallbladder ejection fraction 88%,      normal ejection fraction.   HISTORY OF PRESENT ILLNESS:  Please review the history done by Dr.  Della Goo.  A 30 year old female presented to the ED with 2 days  of right upper quadrant abdominal pain, nausea and vomiting which has  happened in the past 24 hours.  The patient was unable to hold any  liquids.  She denies any hematemesis.  She reported that her emesis has  been clear to white.  Not bilious in  description.  An ultrasound was  done at that time which showed there was no evidence of acute abdomen.  Accordingly, the patient was placed on IV fluids and antiemetics and a  clear liquid diet.  During the hospitalization, nausea and  vomiting  completely resolved but the patient continued to complain of right upper  quadrant abdominal pain.  Accordingly a CT scan of the abdomen and  pelvis was done which suggested the possibility of acute cholecystitis.  Accordingly, surgery, Dr. Consuello Bossier, was consulted and a HIDA  scan was ordered.  HIDA scan was negative for any evidence of acute  cholecystitis and surgery did not recommend any surgical intervention at  this time.  At this time, the patient denies any right upper quadrant  abdominal pain and the nausea and vomiting are completely resolved, also  amylase and lipase were drawn which were completely negative for any  evidence of acute pancreatitis.  We thought the abdominal pain and  nausea and vomiting could be related to gastroenteritis and right upper  quadrant abdominal pain is nonspecific.  The patient was advised to  present to the hospital  if her abdominal pain returned back.   DISPOSITION:  At this time is that the patient is medically stable to be  discharged home and follow with her primary care physician.      Hind Bosie Helper, MD  Electronically Signed     HIE/MEDQ  D:  10/23/2007  T:  10/23/2007  Job:  [id number]

## 2011-03-16 NOTE — Op Note (Signed)
[name], [name]                ACCOUNT NO.:  [id number]   MEDICAL RECORD NO.:  [id number]          PATIENT TYPE:  INP   LOCATION:  5122                         FACILITY:  [facility]   PHYSICIAN:  Bernette Redbird, M.D.   DATE OF BIRTH:  10-11-1981   DATE OF PROCEDURE:  07/11/2008  DATE OF DISCHARGE:  07/11/2008                               OPERATIVE REPORT   PROCEDURE:  Upper endoscopy.   INDICATION:  A 30 year old female with significant dysphagia symptoms,  which are nonspecific in character.  She is 1 day status post  laparoscopic cholecystectomy and 2 days status post ERCP and  sphincterotomy with pancreatic ductal stent placement as far that  procedure, done because of elevated liver chemistries.  The purpose of  today's procedure is to evaluate for any anatomic abnormalities, which  may have led to her dysphagia symptoms, as well as for retrieval of the  pancreatic stent which has now served its purpose.   FINDINGS:  Stent successfully retrieved.  No esophageal stricture or  other explanation for dysphagia seen.   PROCEDURE:  The patient provided written consent for the procedure and  was brought in a fasted state to the endoscopy unit.   SEDATION:  Benadryl 50 mg, fentanyl 125 mcg, and Versed 12.5 mg IV  without arrhythmias or desaturation.   The Pentax adult video endoscope was passed under direct vision,  entering the esophagus without undue difficulty.  The larynx looked  grossly normal.   The esophagus was normal.  In particular, I did not see any esophageal  stricture, ring, or other abnormality.  I did not see any obvious  esophageal spasm or corkscrewing or other abnormalities and I did not  see any mucosal abnormalities such as reflux esophagitis, Barrett's  esophagus, nor any varices infection or neoplasia.  No hiatal hernia was  appreciated.   The stomach was entered.  It contained a moderate bilious residual,  which was suctioned up.  The gastric mucosa was a  little bit  erythematous, but not frankly pathologic.  No erosions, ulcers, polyps,  or masses were seen including retroflexed view of the cardia and the  pylorus, duodenal bulb, and second duodenum looked normal.   The pancreatic stent was readily visible protruding from the papilla in  the second portion of the duodenum.  It was easily grasped with a  polypectomy snare and pulled without resistance into the duodenal lumen  and from there successfully extracted out through the patient's mouth.   No biopsies were obtained.  The patient tolerated the procedure well and  there were no apparent complications.   IMPRESSION:  1. No source of dysphagia symptoms endoscopically evident.  2. Successful removal of pancreatic stent.   RECOMMENDATIONS:  Followup with me can be on a p.r.n. basis.  The  patient was advised that if her dysphagia symptoms continue to be a  problem in the future, we could do a barium swallow and if the  stricture, that was not endoscopically evident today, is demonstrated,  we could do a dilatation.   The patient will need followup of her liver  chemistries, but presumably  this can be done in the context of her postoperative surgical visits.  If they fail to normalize, we would need to see the patient back for  evaluation of chronic LFT elevation.           ______________________________  Bernette Redbird, M.D.     RB/MEDQ  D:  07/11/2008  T:  07/11/2008  Job:  [id number]   cc:   Alfonse Ras, MD

## 2011-03-16 NOTE — Consult Note (Signed)
[name], [name]                ACCOUNT NO.:  [id number]   MEDICAL RECORD NO.:  [id number]          PATIENT TYPE:  INP   LOCATION:  5122                         FACILITY:  [facility]   PHYSICIAN:  Bernette Redbird, M.D.   DATE OF BIRTH:  February 08, 1981   DATE OF CONSULTATION:  07/09/2008  DATE OF DISCHARGE:                                 CONSULTATION   We were asked to see [name] in consultation for a possible  common bile duct obstruction by Dr. Baruch Merl, [facility].   HISTORY OF PRESENT ILLNESS:  This is a 30 year old female who started  feeling back pain on July 08, 2008 at 6 a.m.  She said over time,  the pain migrated to the front of her chest and stayed there.  The pain  was constant.  She became diaphoretic and hot.  The pain was intense.  It was not relieved until she received pain medications in the ER.  She  also describes dysphagia for several years initially to breads, now to  all foods.  She does drink liquids to push the foods down.  Occasionally, she has to regurgitate her food.  She denies NSAIDs.  She  is currently breast feeding.   PAST MEDICAL HISTORY:  Significant for C-section approximately 2 weeks  ago.  She has also had a wisdom teeth removed.  She has a past history  of asthma, fatty liver, and acid reflux.  She is on no medications  currently.   She has an allergy to PENICILLIN.   REVIEW OF SYSTEMS:  Significant for soreness from her recent C-section  in her abdomen.   SOCIAL HISTORY:  Positive for tobacco, negative for alcohol.   FAMILY HISTORY:  She does not know her parents and does not know her  family history.   PHYSICAL EXAMINATION:  GENERAL:  She is alert and oriented in no  apparent distress.  She does have mild jaundice.  VITAL SIGNS:  Temperature is 98.1, pulse 70, respirations 18, blood  pressure is 142/85.  HEART:  Regular rate and rhythm.  LUNGS:  Clear to auscultation anteriorly.  ABDOMEN:  Soft.  She has  good bowel sounds.  She is tender throughout  partially again due to her recent C-section.   BMETs within normal limits.  LFTs show an AST of 27, ALT 104, alk phos  121, total bilirubin 1.5 that is up from 0.5 yesterday.  White count  7.3, platelets 239,000, hemoglobin is 11.9, hematocrit 35.9.  Ultrasound  done yesterday showed acute cholecystitis with a thickened gallbladder  wall and some pericholecystic fluid.  Her CBD was 8.1 mm.  An ultrasound  done in December 2008 had showed a normal gallbladder, normal CBD.  She  also had a normal HIDA scan at that time with a normal ejection  fraction.   ASSESSMENT:  Dr. Molly Maduro Buccini has seen and examined the patient,  collected her history, and reviewed her chart.  His impression is that  this is acute or possibly subacute cholecystitis.  She is 2-week  postpartum, currently breast feeding.  She does  have dysphagia.  Her  increasing LFTs could be cholecystitis; however, new biliary dilatation  implies probable obstruction.  We will proceed with ERCP Andrade at  approximately 1:30 by Dr. Vida Rigger.  Ashby Dawes of the exam as well as risks of bleeding, perforation,  sedation, and pancreatitis were reviewed with the patient with the aid  of a diagram.  The patient understands these risks and wishes to proceed  with the procedure.  Thank you very much for this consultation.      Stephani Police, PA    ______________________________  Bernette Redbird, M.D.    MLY/MEDQ  D:  07/09/2008  T:  07/10/2008  Job:  [id number]   cc:   Bernette Redbird, M.D.  Petra Kuba, M.D.  Alfonse Ras, MD

## 2011-03-19 NOTE — Discharge Summary (Signed)
[name], [name]                ACCOUNT NO.:  [id number]   MEDICAL RECORD NO.:  [id number]          PATIENT TYPE:  INP   LOCATION:  9145                          FACILITY:  [facility]   PHYSICIAN:  Ginger Carne, MD  DATE OF BIRTH:  02-Feb-1981   DATE OF ADMISSION:  06/21/2008  DATE OF DISCHARGE:  06/24/2008                               DISCHARGE SUMMARY   REASON FOR ADMISSION:  Onset of labor.   PROCEDURES PRENATAL:  1. Nonstress test.  2. Ultrasound.   PROCEDURE INTRAPARTUM:  1. Cesarean section, low cervical transverse.   PROCEDURE POSTPARTUM:  None.   COMPLICATIONS OPERATIVE AND POSTPARTUM:  None.   DISCHARGE DIAGNOSIS:  Term pregnancy, delivered.   DISCHARGE INFORMATION:  Discharge date, June 24, 2008.   ACTIVITY:  Pelvic rest x6 weeks.   DIET:  Routine.   MEDICATIONS:  1. Prenatal vitamin p.o. one daily during breast-feeding.  2. Colace 100 mg p.o. b.i.d. as needed for postpartum constipation.  3. Ibuprofen 600 mg p.o. q.48 h. as needed for postpartum pain.   STATUS:  Well.   INSTRUCTIONS:  Routine.  Discharged to home.  Follow up in 6 weeks at  the [facility].   NEWBORN DATA:  Female, weight 5 pounds 1 ounce.  Newborn discharged home  to mother.   LABORATORY DATA:  June 22, 2008:  Hemoglobin 13.1, hematocrit 39.5.   BRIEF HOSPITAL COURSE:  This is a 30 year old G6, P0-0-5-0, who  presented at 36.3 weeks' gestational age with onset of labor.  The  patient was admitted to labor and delivery and delivered via cesarean  section for a breech presentation.  She also had risk factors that  include elevated blood pressures and oligohydramnios.  A viable female  was delivered via primary low-transverse cesarean section on June 24, 2008 with Apgars of 8 and 9.  The patient was placed on magnesium for 24  hours post partum.  The patient did not desire any form contraceptions.  The  patient was given the choices of birth control pill,  Depo-Provera, IUD,  etc, but she denied all choices.  The patient is breast-feeding.  The  patient is discharged with prescription for Colace, prenatal vitamin,  and ibuprofen.  She is to follow up in 6 weeks at the [facility].  GBS negative, RPR nonreactive, rubella immune.      Angeline Slim, MD  Electronically Signed     ______________________________  Ginger Carne, MD    CT/MEDQ  D:  07/17/2008  T:  07/18/2008  Job:  [id number]

## 2011-07-22 LAB — GC/CHLAMYDIA PROBE AMP, GENITAL
Chlamydia, DNA Probe: NEGATIVE
GC Probe Amp, Genital: NEGATIVE

## 2011-07-22 LAB — WET PREP, GENITAL
Clue Cells Wet Prep HPF POC: NONE SEEN
Trich, Wet Prep: NONE SEEN
Yeast Wet Prep HPF POC: NONE SEEN

## 2011-07-22 LAB — POCT PREGNANCY, URINE
Operator id: 266701
Preg Test, Ur: POSITIVE

## 2011-07-22 LAB — URINALYSIS, ROUTINE W REFLEX MICROSCOPIC
Bilirubin Urine: NEGATIVE
Glucose, UA: NEGATIVE
Hgb urine dipstick: NEGATIVE
Ketones, ur: NEGATIVE
Nitrite: NEGATIVE
Protein, ur: NEGATIVE
Specific Gravity, Urine: 1.025
Urobilinogen, UA: 0.2
pH: 6

## 2011-07-22 LAB — HCG, QUANTITATIVE, PREGNANCY: hCG, Beta Chain, Quant, S: 13849 — ABNORMAL HIGH

## 2011-07-23 LAB — URINALYSIS, ROUTINE W REFLEX MICROSCOPIC
Bilirubin Urine: NEGATIVE
Glucose, UA: NEGATIVE
Hgb urine dipstick: NEGATIVE
Ketones, ur: NEGATIVE
Nitrite: NEGATIVE
Protein, ur: NEGATIVE
Specific Gravity, Urine: 1.02
Urobilinogen, UA: 0.2
pH: 8.5 — ABNORMAL HIGH

## 2011-07-23 LAB — INFLUENZA A+B VIRUS AG-DIRECT(RAPID)
Inflenza A Ag: NEGATIVE
Influenza B Ag: NEGATIVE

## 2011-07-23 LAB — STREP A DNA PROBE: Group A Strep Probe: NEGATIVE

## 2011-07-28 LAB — URINALYSIS, ROUTINE W REFLEX MICROSCOPIC
Bilirubin Urine: NEGATIVE
Glucose, UA: NEGATIVE
Hgb urine dipstick: NEGATIVE
Ketones, ur: NEGATIVE
Nitrite: NEGATIVE
Protein, ur: NEGATIVE
Specific Gravity, Urine: 1.02
Urobilinogen, UA: 0.2
pH: 6

## 2011-07-30 LAB — COMPREHENSIVE METABOLIC PANEL
ALT: 15
AST: 19
Albumin: 2.5 — ABNORMAL LOW
Alkaline Phosphatase: 94
BUN: 5 — ABNORMAL LOW
CO2: 22
Calcium: 8.1 — ABNORMAL LOW
Chloride: 107
Creatinine, Ser: 0.46
GFR calc Af Amer: >60
GFR calc non Af Amer: >60
Glucose, Bld: 119 — ABNORMAL HIGH
Potassium: 3.1 — ABNORMAL LOW
Sodium: 134 — ABNORMAL LOW
Total Bilirubin: 0.4
Total Protein: 5.7 — ABNORMAL LOW

## 2011-07-30 LAB — CBC
HCT: 32.6 — ABNORMAL LOW
Hemoglobin: 11.1 — ABNORMAL LOW
MCHC: 33.9
MCV: 86.9
Platelets: 278
RBC: 3.75 — ABNORMAL LOW
RDW: 14.7
WBC: 11.6 — ABNORMAL HIGH

## 2011-07-30 LAB — URINALYSIS, ROUTINE W REFLEX MICROSCOPIC
Bilirubin Urine: NEGATIVE
Glucose, UA: NEGATIVE
Hgb urine dipstick: NEGATIVE
Ketones, ur: NEGATIVE
Nitrite: NEGATIVE
Protein, ur: NEGATIVE
Specific Gravity, Urine: 1.015
Urobilinogen, UA: 0.2
pH: 6

## 2011-07-30 LAB — URIC ACID: Uric Acid, Serum: 3.8

## 2011-08-04 LAB — COMPREHENSIVE METABOLIC PANEL
ALT: 104 — ABNORMAL HIGH
ALT: 104 — ABNORMAL HIGH
ALT: 32
ALT: 99 — ABNORMAL HIGH
AST: 127 — ABNORMAL HIGH
AST: 40 — ABNORMAL HIGH
AST: 64 — ABNORMAL HIGH
AST: 76 — ABNORMAL HIGH
Albumin: 2.9 — ABNORMAL LOW
Albumin: 3 — ABNORMAL LOW
Albumin: 3 — ABNORMAL LOW
Albumin: 3.7
Alkaline Phosphatase: 121 — ABNORMAL HIGH
Alkaline Phosphatase: 132 — ABNORMAL HIGH
Alkaline Phosphatase: 156 — ABNORMAL HIGH
Alkaline Phosphatase: 83
BUN: 3 — ABNORMAL LOW
BUN: 3 — ABNORMAL LOW
BUN: 4 — ABNORMAL LOW
BUN: 7
CO2: 25
CO2: 25
CO2: 28
CO2: 29
Calcium: 8.5
Calcium: 8.5
Calcium: 8.7
Calcium: 8.8
Chloride: 102
Chloride: 103
Chloride: 107
Chloride: 107
Creatinine, Ser: 0.65
Creatinine, Ser: 0.67
Creatinine, Ser: 0.69
Creatinine, Ser: 0.73
GFR calc Af Amer: >60
GFR calc Af Amer: >60
GFR calc Af Amer: >60
GFR calc Af Amer: >60
GFR calc non Af Amer: >60
GFR calc non Af Amer: >60
GFR calc non Af Amer: >60
GFR calc non Af Amer: >60
Glucose, Bld: 102 — ABNORMAL HIGH
Glucose, Bld: 90
Glucose, Bld: 96
Glucose, Bld: 98
Potassium: 3.3 — ABNORMAL LOW
Potassium: 3.5
Potassium: 3.7
Potassium: 3.8
Sodium: 138
Sodium: 139
Sodium: 140
Sodium: 141
Total Bilirubin: 0.5
Total Bilirubin: 1
Total Bilirubin: 1.2
Total Bilirubin: 1.5 — ABNORMAL HIGH
Total Protein: 5.8 — ABNORMAL LOW
Total Protein: 5.8 — ABNORMAL LOW
Total Protein: 5.9 — ABNORMAL LOW
Total Protein: 6.5

## 2011-08-04 LAB — URINALYSIS, ROUTINE W REFLEX MICROSCOPIC
Bilirubin Urine: NEGATIVE
Glucose, UA: NEGATIVE
Ketones, ur: NEGATIVE
Nitrite: NEGATIVE
Protein, ur: NEGATIVE
Specific Gravity, Urine: 1.011
Urobilinogen, UA: 0.2
pH: 6

## 2011-08-04 LAB — TYPE AND SCREEN
ABO/RH(D): B POS
Antibody Screen: NEGATIVE

## 2011-08-04 LAB — CBC
HCT: 35 — ABNORMAL LOW
HCT: 35.9 — ABNORMAL LOW
HCT: 37.4
HCT: 40.8
Hemoglobin: 11.9 — ABNORMAL LOW
Hemoglobin: 11.9 — ABNORMAL LOW
Hemoglobin: 12.2
Hemoglobin: 13.5
MCHC: 32.8
MCHC: 33.1
MCHC: 33.2
MCHC: 34
MCV: 82.6
MCV: 84.2
MCV: 84.8
MCV: 85.1
Platelets: 238
Platelets: 239
Platelets: 242
Platelets: 277
RBC: 4.24
RBC: 4.26
RBC: 4.39
RBC: 4.81
RDW: 14.5
RDW: 14.6
RDW: 14.9
RDW: 14.9
WBC: 10.7 — ABNORMAL HIGH
WBC: 7.3
WBC: 8.1
WBC: 8.1

## 2011-08-04 LAB — DIFFERENTIAL
Basophils Absolute: 0.1
Basophils Relative: 1
Eosinophils Absolute: 0.6
Eosinophils Relative: 6 — ABNORMAL HIGH
Lymphocytes Relative: 23
Lymphs Abs: 2.4
Monocytes Absolute: 0.4
Monocytes Relative: 4
Neutro Abs: 7.2
Neutrophils Relative %: 67

## 2011-08-04 LAB — ABO/RH: ABO/RH(D): B POS

## 2011-08-04 LAB — POCT CARDIAC MARKERS
CKMB, poc: 1.3
Myoglobin, poc: 44.8
Troponin i, poc: <0.05

## 2011-08-04 LAB — URINE MICROSCOPIC-ADD ON

## 2011-08-04 LAB — LIPASE, BLOOD: Lipase: 20

## 2011-08-06 LAB — URINALYSIS, ROUTINE W REFLEX MICROSCOPIC
Bilirubin Urine: NEGATIVE
Glucose, UA: NEGATIVE
Hgb urine dipstick: NEGATIVE
Ketones, ur: NEGATIVE
Nitrite: NEGATIVE
Protein, ur: NEGATIVE
Specific Gravity, Urine: 1.031 — ABNORMAL HIGH
Urobilinogen, UA: 1
pH: 7

## 2011-08-06 LAB — COMPREHENSIVE METABOLIC PANEL
ALT: 20
ALT: 26
AST: 17
AST: 21
Albumin: 2.9 — ABNORMAL LOW
Albumin: 3.8
Alkaline Phosphatase: 45
Alkaline Phosphatase: 56
BUN: 13
BUN: 3 — ABNORMAL LOW
CO2: 23
CO2: 26
Calcium: 8.2 — ABNORMAL LOW
Calcium: 9.1
Chloride: 107
Chloride: 111
Creatinine, Ser: 0.53
Creatinine, Ser: 0.56
GFR calc Af Amer: >60
GFR calc Af Amer: >60
GFR calc non Af Amer: >60
GFR calc non Af Amer: >60
Glucose, Bld: 115 — ABNORMAL HIGH
Glucose, Bld: 124 — ABNORMAL HIGH
Potassium: 3.4 — ABNORMAL LOW
Potassium: 3.8
Sodium: 138
Sodium: 139
Total Bilirubin: 0.5
Total Bilirubin: 0.5
Total Protein: 5.4 — ABNORMAL LOW
Total Protein: 7.1

## 2011-08-06 LAB — BASIC METABOLIC PANEL
BUN: 4 — ABNORMAL LOW
CO2: 24
Calcium: 8.4
Chloride: 111
Creatinine, Ser: 0.59
GFR calc Af Amer: >60
GFR calc non Af Amer: >60
Glucose, Bld: 153 — ABNORMAL HIGH
Potassium: 4
Sodium: 139

## 2011-08-06 LAB — DIFFERENTIAL
Basophils Absolute: 0.1
Basophils Absolute: 0.1
Basophils Relative: 0
Basophils Relative: 1
Eosinophils Absolute: 0.2
Eosinophils Absolute: 0.4
Eosinophils Relative: 2
Eosinophils Relative: 3
Lymphocytes Relative: 24
Lymphocytes Relative: 25
Lymphs Abs: 2.6
Lymphs Abs: 3.6
Monocytes Absolute: 0.6
Monocytes Absolute: 0.6
Monocytes Relative: 4
Monocytes Relative: 6
Neutro Abs: 10.1 — ABNORMAL HIGH
Neutro Abs: 7.3
Neutrophils Relative %: 66
Neutrophils Relative %: 69

## 2011-08-06 LAB — CBC
HCT: 33.5 — ABNORMAL LOW
HCT: 34.9 — ABNORMAL LOW
HCT: 38.8
Hemoglobin: 11.7 — ABNORMAL LOW
Hemoglobin: 11.8 — ABNORMAL LOW
Hemoglobin: 13.3
MCHC: 33.8
MCHC: 34.3
MCHC: 34.8
MCV: 82.6
MCV: 83.1
MCV: 83.9
Platelets: 260
Platelets: 287
Platelets: 314
RBC: 4.03
RBC: 4.16
RBC: 4.69
RDW: 14.1
RDW: 14.3
RDW: 14.3
WBC: 11 — ABNORMAL HIGH
WBC: 13.3 — ABNORMAL HIGH
WBC: 14.6 — ABNORMAL HIGH

## 2011-08-06 LAB — LIPASE, BLOOD
Lipase: 16
Lipase: 19
Lipase: 21

## 2011-08-06 LAB — PREGNANCY, URINE: Preg Test, Ur: NEGATIVE

## 2011-08-06 LAB — AMYLASE: Amylase: 54

## 2011-08-19 LAB — RAPID STREP SCREEN (MED CTR MEBANE ONLY): Streptococcus, Group A Screen (Direct): NEGATIVE

## 2012-09-25 ENCOUNTER — Ambulatory Visit (INDEPENDENT_AMBULATORY_CARE_PROVIDER_SITE_OTHER): Payer: Self-pay | Admitting: Family Medicine

## 2012-09-25 ENCOUNTER — Encounter: Payer: Self-pay | Admitting: Family Medicine

## 2012-09-25 VITALS — BP 147/96 | HR 91 | Ht 66.0 in | Wt 197.0 lb

## 2012-09-25 DIAGNOSIS — I10 Essential (primary) hypertension: Secondary | ICD-10-CM

## 2012-09-25 DIAGNOSIS — E669 Obesity, unspecified: Secondary | ICD-10-CM

## 2012-09-25 DIAGNOSIS — R229 Localized swelling, mass and lump, unspecified: Secondary | ICD-10-CM

## 2012-09-25 DIAGNOSIS — Z8742 Personal history of other diseases of the female genital tract: Secondary | ICD-10-CM

## 2012-09-25 DIAGNOSIS — Z87898 Personal history of other specified conditions: Secondary | ICD-10-CM

## 2012-09-25 LAB — POCT GLYCOSYLATED HEMOGLOBIN (HGB A1C): Hemoglobin A1C: 5.9

## 2012-09-25 MED ORDER — AMLODIPINE BESYLATE 5 MG PO TABS: 5.0000 mg | ORAL_TABLET | Freq: Every day | ORAL | Status: DC

## 2012-09-25 NOTE — Patient Instructions (Addendum)
Once you qualify for the "orange card", I would like to check a cholesterol and a basic metabolic panel. For this, call the office to make a lab appointment. For the lab appointment, please come fasting (no food or drink after midnight).   For the blood pressure, I am going to start you on a blood pressure medicine called norvasc 5mg  daily. One of the side effects is lower leg swelling.  Please follow up in 2 weeks to see how your blood pressure is doing.   Also, you can come to the clinic for a pap smear, since this needs to be done as your routine screening test.

## 2012-09-26 ENCOUNTER — Telehealth: Payer: Self-pay | Admitting: *Deleted

## 2012-09-26 ENCOUNTER — Encounter: Payer: Self-pay | Admitting: Family Medicine

## 2012-09-26 DIAGNOSIS — Z87898 Personal history of other specified conditions: Secondary | ICD-10-CM | POA: Insufficient documentation

## 2012-09-26 DIAGNOSIS — E669 Obesity, unspecified: Secondary | ICD-10-CM | POA: Insufficient documentation

## 2012-09-26 DIAGNOSIS — I10 Essential (primary) hypertension: Secondary | ICD-10-CM | POA: Insufficient documentation

## 2012-09-26 DIAGNOSIS — R229 Localized swelling, mass and lump, unspecified: Secondary | ICD-10-CM | POA: Insufficient documentation

## 2012-09-26 NOTE — Assessment & Plan Note (Signed)
Elevated BP in office which correlates with reported readings at home. Would like to start medicine in office and do not have baseline labs. Will therefore start amlodipine 5mg  daily and re-evaluate BP in 2 weeks. Will also obtain CMP, fasting lipid when patient is able.

## 2012-09-26 NOTE — Telephone Encounter (Signed)
Called pt. Left message to call back. [name], [name]  A1C 5.9 %

## 2012-09-26 NOTE — Telephone Encounter (Signed)
Pt called back and info was given

## 2012-09-26 NOTE — Assessment & Plan Note (Signed)
Subcutaneous mass palpable on left flank, with tenderness. Mobile and non fixed. Likely lipoma, but cannot exclude other etiology. Will obtain ultrasound at next visit.

## 2012-09-26 NOTE — Assessment & Plan Note (Signed)
Patient concerned about diabetes. In context of obesity, will check A1c which was low at 5.9. Will address diet and exercise at future visits.

## 2012-09-26 NOTE — Progress Notes (Signed)
Patient ID: [name], female   DOB: [birth year], 31 y.o.   MRN: [id number]  Chief Complaint  Patient presents with  . new patient  . Hypertension   HPI [name] is a 31 y.o. female who presents to establish care. Concerns are the following: - HTN: doesn't have a h/o BP other than during pregnancy, but recently she has been checking her BP at home and it has been in the 140's/90's. She denies any shortness of breath. She does report some chest discomfort that occurs independently from exertion and occurs when she is stressed or anxious. The feeling lasts for 20 minutes and goes away once she calms down. This has been occuring for several months. She denies any nausea or diaphoresis when this occurs.   - concern about diabetes: reports that she feels shaky when she doesn't eat something for a long period of time and she is afraid she is diabetic. Eating makes it go away, it occurs every day. She also reports pins and needles in her hands and feet for many years, occuring more often recently.   - knot in left lower back: first noticed it as a finger nail size lump, about a year ago. More recently it has increased in size and is painful with palpation. She denies any similar knots anywhere else.    Past Medical History  Diagnosis Date  . Psoriasis   . Abnormal Pap smear     patient states she had "cervical cancer" at 31 yo for which she had removal of atypical cells.     Past Surgical History  Procedure Date  . Cholecystectomy 2009  . Cesarean section 2009, 2011    Family History  Problem Relation Age of Onset  . Drug abuse Mother   . Heart disease Maternal Grandmother   . Heart disease Maternal Grandfather     Social History History  Substance Use Topics  . Smoking status: Current Every Day Smoker -- 0.5 packs/day    Types: Cigarettes  . Smokeless tobacco: Not on file  . Alcohol Use: Not on file    Allergies  Allergen Reactions  . Penicillins     Current  Outpatient Prescriptions  Medication Sig Dispense Refill  . amLODipine (NORVASC) 5 MG tablet Take 1 tablet (5 mg total) by mouth daily.  30 tablet  3    Review of Systems Review of Systems Negative except per HPI Blood pressure 147/96, pulse 91, height 5\' 6"  (1.676 m), weight 197 lb (89.359 kg), last menstrual period 09/24/2012.  Physical Exam Physical Exam General appearance - alert, well appearing, mildly anxious appearing Nose - normal and patent, no erythema, discharge or polyps Mouth - mucous membranes moist, pharynx normal without lesions Neck - supple, no significant adenopathy Chest - clear to auscultation, no wheezes, rales or rhonchi, symmetric air entry Heart - normal rate, regular rhythm, normal S1, S2, no murmurs, rubs, clicks or gallops Abdomen - soft, nontender, nondistended, no masses or organomegaly Extremities - peripheral pulses normal, no pedal edema Left paraspinal muscle: palpable 3x2cm nodule, mobile, tender with palpation, non fluctuant.  Data Reviewed NA  Assessment    See problem list    Plan   see problem list       [name] 09/26/2012, 6:27 PM

## 2012-09-26 NOTE — Assessment & Plan Note (Signed)
Patient reports history of cervical cancer at age 31yo, which appears unusual. Last pap from 2011 was normal. Will need to repeat PAP smear at a following visit.

## 2012-11-01 HISTORY — PX: LIPOMA EXCISION: SHX5283

## 2012-12-28 ENCOUNTER — Ambulatory Visit (INDEPENDENT_AMBULATORY_CARE_PROVIDER_SITE_OTHER): Payer: Self-pay | Admitting: Family Medicine

## 2012-12-28 ENCOUNTER — Encounter: Payer: Self-pay | Admitting: Family Medicine

## 2012-12-28 VITALS — BP 145/87 | HR 80 | Temp 98.3°F | Ht 66.0 in | Wt 200.0 lb

## 2012-12-28 DIAGNOSIS — A084 Viral intestinal infection, unspecified: Secondary | ICD-10-CM | POA: Insufficient documentation

## 2012-12-28 DIAGNOSIS — A088 Other specified intestinal infections: Secondary | ICD-10-CM

## 2012-12-28 MED ORDER — PROMETHAZINE HCL 12.5 MG PO TABS: 12.5000 mg | ORAL_TABLET | Freq: Four times a day (QID) | ORAL | Status: DC | PRN

## 2012-12-28 NOTE — Patient Instructions (Addendum)
You likely have a gastroenteritis that is causing your symptoms. I am going to send a prescription for nausea to take. The most important thing is going to be for you to stay hydrated.  Viral Gastroenteritis Viral gastroenteritis is also known as stomach flu. This condition affects the stomach and intestinal tract. It can cause sudden diarrhea and vomiting. The illness typically lasts 3 to 8 days. Most people develop an immune response that eventually gets rid of the virus. While this natural response develops, the virus can make you quite ill. CAUSES  Many different viruses can cause gastroenteritis, such as rotavirus or noroviruses. You can catch one of these viruses by consuming contaminated food or water. You may also catch a virus by sharing utensils or other personal items with an infected person or by touching a contaminated surface. SYMPTOMS  The most common symptoms are diarrhea and vomiting. These problems can cause a severe loss of body fluids (dehydration) and a body salt (electrolyte) imbalance. Other symptoms may include:  Fever.  Headache.  Fatigue.  Abdominal pain. DIAGNOSIS  Your caregiver can usually diagnose viral gastroenteritis based on your symptoms and a physical exam. A stool sample may also be taken to test for the presence of viruses or other infections. TREATMENT  This illness typically goes away on its own. Treatments are aimed at rehydration. The most serious cases of viral gastroenteritis involve vomiting so severely that you are not able to keep fluids down. In these cases, fluids must be given through an intravenous line (IV). HOME CARE INSTRUCTIONS   Drink enough fluids to keep your urine clear or pale yellow. Drink small amounts of fluids frequently and increase the amounts as tolerated.  Ask your caregiver for specific rehydration instructions.  Avoid:  Foods high in sugar.  Alcohol.  Carbonated drinks.  Tobacco.  Juice.  Caffeine  drinks.  Extremely hot or cold fluids.  Fatty, greasy foods.  Too much intake of anything at one time.  Dairy products until 24 to 48 hours after diarrhea stops.  You may consume probiotics. Probiotics are active cultures of beneficial bacteria. They may lessen the amount and number of diarrheal stools in adults. Probiotics can be found in yogurt with active cultures and in supplements.  Wash your hands well to avoid spreading the virus.  Only take over-the-counter or prescription medicines for pain, discomfort, or fever as directed by your caregiver. Do not give aspirin to children. Antidiarrheal medicines are not recommended.  Ask your caregiver if you should continue to take your regular prescribed and over-the-counter medicines.  Keep all follow-up appointments as directed by your caregiver. SEEK IMMEDIATE MEDICAL CARE IF:   You are unable to keep fluids down.  You do not urinate at least once every 6 to 8 hours.  You develop shortness of breath.  You notice blood in your stool or vomit. This may look like coffee grounds.  You have abdominal pain that increases or is concentrated in one small area (localized).  You have persistent vomiting or diarrhea.  You have a fever.  The patient is a child younger than 3 months, and he or she has a fever.  The patient is a child older than 3 months, and he or she has a fever and persistent symptoms.  The patient is a child older than 3 months, and he or she has a fever and symptoms suddenly get worse.  The patient is a baby, and he or she has no tears when crying. MAKE SURE  YOU:   Understand these instructions.  Will watch your condition.  Will get help right away if you are not doing well or get worse. Document Released: 10/18/2005 Document Revised: 01/10/2012 Document Reviewed: 08/04/2011 [facility] Patient Information 2013 [place], [place].

## 2012-12-28 NOTE — Progress Notes (Signed)
Patient ID: [name]    DOB: [birth year], 32 y.o.   MRN: [id number] --- Subjective:  [name] is a 32 y.o.female with h/o HTN who presents with nausea, vomiting and diarrhea that started Tuesday evening. Non bilious, non bloody vomiting that occurs after eating. 3-4 episodes of non bloody, non melanous diarrhea. Abdominal pain associated with eating. Has had trouble keeping fluids or food down. Making same amount of urine.  No fever. Some chills and myalgias.  No cough, no rhinorrhea, no shortness of breath.    ROS: see HPI Past Medical History: reviewed and updated medications and allergies. Social History: Tobacco: 1/2 pack per day  Objective: Filed Vitals:   12/28/12 1046  BP: 145/87  Pulse: 80  Temp: 98.3 F (36.8 C)    Physical Examination:   General appearance - alert, well appearing, and in no distress Ears - bilateral TM's and external ear canals normal Nose - normal and patent, no erythema, discharge or polyps Mouth - mucous membranes moist, pharynx normal without lesions Neck - supple, no significant adenopathy Chest - clear to auscultation, no wheezes, rales or rhonchi, symmetric air entry Heart - normal rate, regular rhythm, normal S1, S2, no murmurs, rubs, clicks or gallops Abdomen - soft, discomfort with palpation of left lower quadrant and epigastrium, no rebound, no guarding

## 2012-12-28 NOTE — Assessment & Plan Note (Signed)
Nausea, vomiting and diarrhea likely viral gastroenteritis. No recent antibiotic use so likely not c-diff.  Treat symptomatically with phenergan for nausea. Reviewed red flags for return: not keeping fluids down despite phenergan, worsening symptoms in the next couple of days.  Work note given.

## 2013-02-22 ENCOUNTER — Other Ambulatory Visit: Payer: Self-pay | Admitting: *Deleted

## 2013-02-22 MED ORDER — AMLODIPINE BESYLATE 5 MG PO TABS: 5.0000 mg | ORAL_TABLET | Freq: Every day | ORAL | Status: DC

## 2013-02-22 NOTE — Addendum Note (Signed)
Addended by: Damita Lack on: 02/22/2013 01:53 PM   Modules accepted: Orders

## 2013-06-03 ENCOUNTER — Encounter (HOSPITAL_COMMUNITY): Payer: Self-pay | Admitting: *Deleted

## 2013-06-03 ENCOUNTER — Inpatient Hospital Stay (HOSPITAL_COMMUNITY)
Admission: AD | Admit: 2013-06-03 | Discharge: 2013-06-03 | Disposition: A | Discharge status: Home / Self Care | Payer: Medicaid Other | Source: Ambulatory Visit | Attending: Family Medicine | Admitting: Family Medicine

## 2013-06-03 DIAGNOSIS — Z8614 Personal history of Methicillin resistant Staphylococcus aureus infection: Secondary | ICD-10-CM | POA: Insufficient documentation

## 2013-06-03 DIAGNOSIS — N764 Abscess of vulva: Secondary | ICD-10-CM | POA: Insufficient documentation

## 2013-06-03 HISTORY — DX: Essential (primary) hypertension: I10

## 2013-06-03 MED ORDER — SULFAMETHOXAZOLE-TRIMETHOPRIM 400-80 MG PO TABS: 1.0000 | ORAL_TABLET | Freq: Two times a day (BID) | ORAL | Status: DC

## 2013-06-03 MED ORDER — SULFAMETHOXAZOLE-TRIMETHOPRIM 400-80 MG PO TABS
1.0000 | ORAL_TABLET | Freq: Once | ORAL | Status: AC
  Administered 2013-06-03: 1 via ORAL
  Filled 2013-06-03: qty 1

## 2013-06-03 MED ORDER — HYDROCODONE-ACETAMINOPHEN 5-325 MG PO TABS
2.0000 | ORAL_TABLET | Freq: Once | ORAL | Status: AC
  Administered 2013-06-03: 2 via ORAL
  Filled 2013-06-03: qty 2

## 2013-06-03 MED ORDER — HYDROCODONE-ACETAMINOPHEN 5-325 MG PO TABS: 1.0000 | ORAL_TABLET | Freq: Four times a day (QID) | ORAL | Status: DC | PRN

## 2013-06-03 NOTE — MAU Provider Note (Signed)
  History     CSN: [id number]  Arrival date and time: 06/03/13 1446   First Provider Initiated Contact with Patient 06/03/13 1520      Chief Complaint  Patient presents with  . Abscess   Abscess    [name] is a 32 y.o. [id number] who is here today with a "bump" on her labia. She states that it started a few days ago, and has gotten worse. She states that it is painful.   Past Medical History  Diagnosis Date  . Psoriasis   . Hypertension   . Abnormal Pap smear     patient states she had "cervical cancer" at 32 yo for which she had removal of atypical cells.   . Ovarian cyst rupture     Past Surgical History  Procedure Laterality Date  . Cholecystectomy  2009  . Cesarean section  2009, 2011  . Cryotherapy      Family History  Problem Relation Age of Onset  . Drug abuse Mother   . Heart disease Maternal Grandmother   . Heart disease Maternal Grandfather     History  Substance Use Topics  . Smoking status: Current Every Day Smoker -- 0.50 packs/day    Types: Cigarettes  . Smokeless tobacco: Not on file  . Alcohol Use: No    Allergies:  Allergies  Allergen Reactions  . Penicillins     Prescriptions prior to admission  Medication Sig Dispense Refill  . amLODipine (NORVASC) 5 MG tablet Take 1 tablet (5 mg total) by mouth daily.  30 tablet  3  . promethazine (PHENERGAN) 12.5 MG tablet Take 1 tablet (12.5 mg total) by mouth every 6 (six) hours as needed for nausea.  20 tablet  0    ROS Physical Exam   Blood pressure 144/93, pulse 126, temperature 99.1 F (37.3 C), temperature source Oral, resp. rate 16, height 5' 6.5" (1.689 m), weight 87.454 kg (192 lb 12.8 oz), last menstrual period 05/21/2013, SpO2 100.00%.  Physical Exam  Nursing note and vitals reviewed. Constitutional: She is oriented to person, place, and time. She appears well-developed and well-nourished. No distress.  Cardiovascular: Normal rate.   Respiratory: Effort normal.  GI: Soft.   Genitourinary:   Left labia: erythematous, firm area. Not fluctuant, and no ready for I&D. Right labia: normal   Neurological: She is alert and oriented to person, place, and time.  Skin: Skin is dry.  Psychiatric: She has a normal mood and affect.    MAU Course  Procedures    Assessment and Plan   1. Vulvar abscess   2. History of MRSA  RX: Septra BID x 10 days  vicodin #10 Warm soaks Will return to MAU if area becomes softer and ready to drain   Tawnya Crook 06/03/2013, 3:32 PM

## 2013-06-03 NOTE — MAU Note (Signed)
Patient states she had a bump on the left labia on Thursday 7-31 that has gotten bigger and having a lot of pain. Denies bleeding or discharge. Nausea, no vomiting.

## 2013-06-03 NOTE — MAU Provider Note (Signed)
Chart reviewed and agree with management and plan.  

## 2013-06-27 ENCOUNTER — Other Ambulatory Visit: Payer: Self-pay | Admitting: *Deleted

## 2013-06-28 MED ORDER — AMLODIPINE BESYLATE 5 MG PO TABS: 5.0000 mg | ORAL_TABLET | Freq: Every day | ORAL | Status: DC

## 2013-07-06 ENCOUNTER — Encounter: Payer: Self-pay | Admitting: Family Medicine

## 2013-07-06 ENCOUNTER — Ambulatory Visit (INDEPENDENT_AMBULATORY_CARE_PROVIDER_SITE_OTHER): Payer: Medicaid Other | Admitting: Family Medicine

## 2013-07-06 VITALS — BP 126/85 | HR 79 | Temp 98.6°F | Ht 66.0 in | Wt 187.0 lb

## 2013-07-06 DIAGNOSIS — N764 Abscess of vulva: Secondary | ICD-10-CM

## 2013-07-06 DIAGNOSIS — R229 Localized swelling, mass and lump, unspecified: Secondary | ICD-10-CM

## 2013-07-06 DIAGNOSIS — M7989 Other specified soft tissue disorders: Secondary | ICD-10-CM

## 2013-07-06 DIAGNOSIS — Z23 Encounter for immunization: Secondary | ICD-10-CM

## 2013-07-06 DIAGNOSIS — M799 Soft tissue disorder, unspecified: Secondary | ICD-10-CM

## 2013-07-06 DIAGNOSIS — R1900 Intra-abdominal and pelvic swelling, mass and lump, unspecified site: Secondary | ICD-10-CM

## 2013-07-06 NOTE — Patient Instructions (Addendum)
Please follow up with me in a couple months to check on the labial abscess. Intercourse is now ok. If you have worsening pain, chills, fevers, nausea, vomiting, please return to clinic.   Regarding the lump on the left side, I am getting an ultrasound to check it out a little further. We may eventually send you to surgery for them to see whether they can remove it.

## 2013-07-09 DIAGNOSIS — N764 Abscess of vulva: Secondary | ICD-10-CM | POA: Insufficient documentation

## 2013-07-09 NOTE — Assessment & Plan Note (Signed)
Fibroma vs lipoma: will obtain US to better evaluate. Since it has been bothering patient with pain and size increase, may consider sending her to surgery for evaluation for removal.

## 2013-07-09 NOTE — Assessment & Plan Note (Signed)
Almost completely resolved with only deeper and small area that can still be palpated.  Follow up in 1-2 months to evaluate for complete resolution.

## 2013-07-09 NOTE — Progress Notes (Signed)
Patient ID: [name]    DOB: 01-Jun-1981, 32 y.o.   MRN: [id number] --- Subjective:  [name] is a 32 y.o.female who presents for follow up on left sided labial abscess for which she was seen on 06/03/13. At the time, it was not lanced since it was not fluctuant.  She was started on antibiotics which she took. She had some drainage shortly after being seen in the ED. Since then, the swelling has reduced. She can barely feel the area. She does have some mild itching where the area drained spontaneously. No fevers, no chills.   - lump on left flank: has been present for a few months. She feels like it has grown some since the last time she was seen here. She was not able to get imaging of it done since she did not have insurance. She now is having sharp pain associated with it.  ROS: see HPI Past Medical History: reviewed and updated medications and allergies. Social History: Tobacco: 1/2 pack per day  Objective: Filed Vitals:   07/06/13 1141  BP: 126/85  Pulse: 79  Temp: 98.6 F (37 C)    Physical Examination:   General appearance - alert, well appearing, and in no distress Skin - deep 0.5cm non fluctuant, indurated area deep in labial tissue, no drainage, no redness, no tenderness Left flank - 3cmx4cm tender, non fluctuant rectangular shape nodule

## 2013-07-10 ENCOUNTER — Other Ambulatory Visit: Payer: Self-pay | Admitting: Family Medicine

## 2013-07-10 ENCOUNTER — Ambulatory Visit
Admission: RE | Admit: 2013-07-10 | Discharge: 2013-07-10 | Disposition: A | Discharge status: Home / Self Care | Payer: Medicaid Other | Source: Ambulatory Visit | Attending: Family Medicine | Admitting: Family Medicine

## 2013-07-10 DIAGNOSIS — M7989 Other specified soft tissue disorders: Secondary | ICD-10-CM

## 2013-07-13 ENCOUNTER — Telehealth: Payer: Self-pay | Admitting: Family Medicine

## 2013-07-13 DIAGNOSIS — D179 Benign lipomatous neoplasm, unspecified: Secondary | ICD-10-CM

## 2013-07-13 NOTE — Telephone Encounter (Signed)
Wanting to know results from ultrasound done on Tues

## 2013-07-13 NOTE — Telephone Encounter (Signed)
Called patient back regarding the [place] results. Let her know that it looks like lipoma vs subcutaneous fat on the [place]. Recommended that she be evaluated by surgery to see if there would be any possible removal, given that patient has been having pain in that area. Discussed case with Dr. Derrell Lolling at [facility] and will therefore refer her to him. She expressed understanding and agreed with plan.   Marena Chancy, PGY-3 Family Medicine Resident

## 2013-07-13 NOTE — Telephone Encounter (Signed)
CALLED PT. Informed, that Dr.Losq will review [place] and call her back. Pt agreed. Fwd. To Dr.Losq. [name], Renato Battles

## 2013-07-23 ENCOUNTER — Telehealth: Payer: Self-pay | Admitting: Family Medicine

## 2013-07-23 NOTE — Telephone Encounter (Signed)
[name] calling to inquire about contact from [facility].  Haven't heard anything regarding an appt yet.  Wanted to know what's the holdup.  Please contact her as soon as possible for info.

## 2013-07-23 NOTE — Telephone Encounter (Signed)
LMOVM for pt to return phone call.  Pt has appt with [facility] for the following:  07/26/13 @ 10:40am. Milas Gain, Maryjo Rochester

## 2013-07-26 ENCOUNTER — Encounter (INDEPENDENT_AMBULATORY_CARE_PROVIDER_SITE_OTHER): Payer: Self-pay | Admitting: General Surgery

## 2013-07-26 ENCOUNTER — Ambulatory Visit (INDEPENDENT_AMBULATORY_CARE_PROVIDER_SITE_OTHER): Payer: Medicaid Other | Admitting: General Surgery

## 2013-07-26 VITALS — BP 122/74 | HR 84 | Temp 97.4°F | Resp 14 | Ht 66.0 in | Wt 193.4 lb

## 2013-07-26 DIAGNOSIS — R229 Localized swelling, mass and lump, unspecified: Secondary | ICD-10-CM

## 2013-07-26 NOTE — Progress Notes (Signed)
Patient ID: [name], female   DOB: 07-Feb-1981, 32 y.o.   MRN: [id number]  Chief Complaint  Patient presents with  . New Evaluation    eval left flank soft tis smas     HPI [name] is a 32 y.o. female.  The patient is a 32 year old female who is referred by Dr. Gwenlyn Saran for evaluation of a left flank soft tissue mass. Patient states this is been in for approximately a year. She states become more painful and feels it got bigger over the last 6-8 months. Patient's undergone ultrasound which revealed signs consistent with a lipoma.  HPI  Past Medical History  Diagnosis Date  . Psoriasis   . Hypertension   . Abnormal Pap smear     patient states she had "cervical cancer" at 32 yo for which she had removal of atypical cells.   . Ovarian cyst rupture     Past Surgical History  Procedure Laterality Date  . Cholecystectomy  2009  . Cesarean section  2009, 2011  . Cryotherapy      Family History  Problem Relation Age of Onset  . Drug abuse Mother   . Heart disease Maternal Grandmother   . Heart disease Maternal Grandfather     Social History History  Substance Use Topics  . Smoking status: Current Every Day Smoker -- 0.50 packs/day    Types: Cigarettes  . Smokeless tobacco: Never Used  . Alcohol Use: No    Allergies  Allergen Reactions  . Penicillins     unknown    Current Outpatient Prescriptions  Medication Sig Dispense Refill  . amLODipine (NORVASC) 5 MG tablet Take 1 tablet (5 mg total) by mouth daily.  30 tablet  3   No current facility-administered medications for this visit.    Review of Systems Review of Systems  Constitutional: Negative.   HENT: Negative.   Respiratory: Negative.   Cardiovascular: Negative.   Gastrointestinal: Negative.   Neurological: Negative.   All other systems reviewed and are negative.    Blood pressure 122/74, pulse 84, temperature 97.4 F (36.3 C), temperature source Temporal, resp. rate 14, height 5\' 6"  (1.676 m),  weight 193 lb 6.4 oz (87.726 kg).  Physical Exam Physical Exam  Constitutional: She is oriented to person, place, and time. She appears well-developed and well-nourished.  HENT:  Head: Normocephalic and atraumatic.  Eyes: Conjunctivae and EOM are normal. Pupils are equal, round, and reactive to light.  Neck: Normal range of motion. Neck supple.  Cardiovascular: Normal rate, regular rhythm, normal heart sounds and intact distal pulses.   Pulmonary/Chest: Effort normal and breath sounds normal.  Abdominal: Soft. Bowel sounds are normal.    Neurological: She is alert and oriented to person, place, and time.  Skin: Skin is warm and dry.    Data Reviewed Ultrasound reviewed  Assessment    32 year old female with a left flank soft tissue mass, likely consistent with either a cyst or a lipoma.     Plan     1. Will proceed to the operating room for excision of left flank mass 2. I discussed with her the risks and benefits of the operation to include but not limited to: Recurrence, infection, bleeding, and wound complications.        Marigene Ehlers., Elyon Zoll 07/26/2013, 10:31 AM

## 2013-08-10 ENCOUNTER — Other Ambulatory Visit (INDEPENDENT_AMBULATORY_CARE_PROVIDER_SITE_OTHER): Payer: Self-pay | Admitting: General Surgery

## 2013-08-10 ENCOUNTER — Emergency Department (HOSPITAL_COMMUNITY): Payer: Medicaid Other

## 2013-08-10 ENCOUNTER — Encounter (HOSPITAL_COMMUNITY): Payer: Self-pay | Admitting: Emergency Medicine

## 2013-08-10 ENCOUNTER — Emergency Department (HOSPITAL_COMMUNITY)
Admission: EM | Admit: 2013-08-10 | Discharge: 2013-08-10 | Disposition: A | Discharge status: Home / Self Care | Payer: Medicaid Other | Attending: Emergency Medicine | Admitting: Emergency Medicine

## 2013-08-10 DIAGNOSIS — R531 Weakness: Secondary | ICD-10-CM

## 2013-08-10 DIAGNOSIS — F17200 Nicotine dependence, unspecified, uncomplicated: Secondary | ICD-10-CM | POA: Insufficient documentation

## 2013-08-10 DIAGNOSIS — Z9889 Other specified postprocedural states: Secondary | ICD-10-CM | POA: Insufficient documentation

## 2013-08-10 DIAGNOSIS — F411 Generalized anxiety disorder: Secondary | ICD-10-CM | POA: Insufficient documentation

## 2013-08-10 DIAGNOSIS — R55 Syncope and collapse: Secondary | ICD-10-CM | POA: Insufficient documentation

## 2013-08-10 DIAGNOSIS — M6281 Muscle weakness (generalized): Secondary | ICD-10-CM

## 2013-08-10 DIAGNOSIS — Z79899 Other long term (current) drug therapy: Secondary | ICD-10-CM | POA: Insufficient documentation

## 2013-08-10 DIAGNOSIS — D1739 Benign lipomatous neoplasm of skin and subcutaneous tissue of other sites: Secondary | ICD-10-CM

## 2013-08-10 DIAGNOSIS — R51 Headache: Secondary | ICD-10-CM | POA: Insufficient documentation

## 2013-08-10 DIAGNOSIS — I10 Essential (primary) hypertension: Secondary | ICD-10-CM | POA: Insufficient documentation

## 2013-08-10 DIAGNOSIS — Z872 Personal history of diseases of the skin and subcutaneous tissue: Secondary | ICD-10-CM | POA: Insufficient documentation

## 2013-08-10 DIAGNOSIS — Z8742 Personal history of other diseases of the female genital tract: Secondary | ICD-10-CM | POA: Insufficient documentation

## 2013-08-10 DIAGNOSIS — Z880 Allergy status to penicillin: Secondary | ICD-10-CM | POA: Insufficient documentation

## 2013-08-10 LAB — GLUCOSE, CAPILLARY: Glucose-Capillary: 115 mg/dL — ABNORMAL HIGH (ref 70–99)

## 2013-08-10 NOTE — Consult Note (Signed)
NEURO HOSPITALIST CONSULT NOTE    Reason for Consult: HA, left hand weakness following elective surgery today. HPI:                                                                                                                                          [name] is an 32 y.o. female, right handed, with a past medical history significant fot HTN, smoking, psoriasis, comes to [facility] ED from day surgery center after undergoing elective removal of a lower back lipoma today. She said that she doesn't recall everything but apparently almost immediately after recovering from anesthesia started having a " pain in the left forehead that moved to my face", double vision, and then became briefly unresponsive, short of breath, and was noted to have decreased grip in the left hand. She said that the left leg was fine and by the time she arrived to the ED the ED the HA was gone. Never had similar symptoms before. According to her husband, he did not notice slurred speech or face droopiness.  She denies vertigo, difficulty swallowing, imbalance, language or visual disturbances. No recent head or neck trauma, fever, or infection. CT brain here at [facility] was unremarkable. Stated that she feels much better and wants to go home.  Past Medical History  Diagnosis Date  . Psoriasis   . Hypertension   . Abnormal Pap smear     patient states she had "cervical cancer" at 32 yo for which she had removal of atypical cells.   . Ovarian cyst rupture     Past Surgical History  Procedure Laterality Date  . Cholecystectomy  2009  . Cesarean section  2009, 2011  . Cryotherapy    . Back surgery      Family History  Problem Relation Age of Onset  . Drug abuse Mother   . Heart disease Maternal Grandmother   . Heart disease Maternal Grandfather     Social History:  reports that she has been smoking Cigarettes.  She has been smoking about 0.50 packs per day. She has never used smokeless tobacco. She  reports that she does not drink alcohol or use illicit drugs.  Allergies  Allergen Reactions  . Penicillins     unknown    MEDICATIONS:  I have reviewed the patient's current medications.   ROS:                                                                                                                                       History obtained from the patient, husband,and chart review.  General ROS: negative for - chills, fatigue, fever, night sweats, or weight loss Psychological ROS: negative for - behavioral disorder, hallucinations, memory difficulties, mood swings or suicidal ideation Ophthalmic ROS: negative for - blurry vision, double vision, eye pain or loss of vision ENT ROS: negative for - epistaxis, nasal discharge, oral lesions, sore throat, tinnitus or vertigo Allergy and Immunology ROS: negative for - hives or itchy/watery eyes Hematological and Lymphatic ROS: negative for - bleeding problems, bruising or swollen lymph nodes Endocrine ROS: negative for - galactorrhea, hair pattern changes, polydipsia/polyuria or temperature intolerance Respiratory ROS: negative for - cough, hemoptysis, shortness of breath or wheezing Cardiovascular ROS: negative for - chest pain, dyspnea on exertion, edema or irregular heartbeat Gastrointestinal ROS: negative for - abdominal pain, diarrhea, hematemesis, nausea/vomiting or stool incontinence Genito-Urinary ROS: negative for - dysuria, hematuria, incontinence or urinary frequency/urgency Musculoskeletal ROS: negative for - joint swelling Neurological ROS: as noted in HPI Dermatological ROS: negative for rash and skin lesion changes   Physical exam: pleasant female in no apparent distress.Blood pressure 101/56, pulse 67, temperature 98.1 F (36.7 C), temperature source Oral, resp. rate 17, height 5\' 6"  (1.676 m), weight 83.008  kg (183 lb), last menstrual period 08/10/2013, SpO2 98.00%.Head: normocephalic. Neck: supple, no bruits, no JVD. Cardiac: no murmurs. Lungs: clear. Abdomen: soft, no tender, no mass. Extremities: no edema.  Neurologic Examination:                                                                                                      Mental Status: Alert, awake, oriented x 4, thought content appropriate. Comprehension, naming, and repetition intact. Speech fluent without evidence of aphasia.  Able to follow 3 step commands without difficulty. Cranial Nerves: II: Discs flat bilaterally; Visual fields grossly normal, pupils equal, round, reactive to light and accommodation III,IV, VI: ptosis not present, extra-ocular motions intact bilaterally V,VII: smile symmetric, facial light touch sensation normal bilaterally VIII: hearing normal bilaterally IX,X: gag reflex present XI: bilateral shoulder shrug XII: midline tongue extension Motor: Right : Upper extremity   5/5    Left:     Upper extremity   5/5 proximally, 5-/5 hand left grip (however, rapid alternating movements  left hand are normal)  Lower extremity   5/5     Lower extremity   5/5 Tone and bulk:normal tone throughout; no atrophy noted Sensory: Pinprick and light touch intact throughout, bilaterally Deep Tendon Reflexes:  Right: Upper Extremity   Left: Upper extremity   biceps (C-5 to C-6) 2/4   biceps (C-5 to C-6) 2/4 tricep (C7) 2/4    triceps (C7) 2/4 Brachioradialis (C6) 2/4  Brachioradialis (C6) 2/4  Lower Extremity Lower Extremity  quadriceps (L-2 to L-4) 2/4   quadriceps (L-2 to L-4) 2/4 Achilles (S1) 2/4   Achilles (S1) 2/4  Plantars: Right: downgoing   Left: downgoing Cerebellar: normal finger-to-nose,  normal heel-to-shin test Gait: No ataxia. CV: pulses palpable throughout    No results found for this basename: cbc, bmp, coags, chol, tri, ldl, hga1c    Results for orders placed during the hospital encounter of  08/10/13 (from the past 48 hour(s))  GLUCOSE, CAPILLARY     Status: Abnormal   Collection Time    08/10/13  1:32 PM      Result Value Range   Glucose-Capillary 115 (*) 70 - 99 mg/dL   Comment 1 Notify RN     Comment 2 Documented in Chart      Ct Head Wo Contrast  08/10/2013   CLINICAL DATA:  Syncopal episode. Breathes unresponsiveness with difficulty breathing.  EXAM: CT HEAD WITHOUT CONTRAST  TECHNIQUE: Contiguous axial images were obtained from the base of the skull through the vertex without intravenous contrast.  COMPARISON:  08/11/2004.  FINDINGS: The ventricles are normal in size and configuration. There are no parenchymal masses or mass effect. There are no areas of abnormal parenchymal attenuation. No evidence of a cortical infarct.  No extra-axial masses or abnormal fluid collections.  There is no intracranial hemorrhage.  The visualized sinuses and mastoid air cells are clear.  IMPRESSION: Normal unenhanced CT scan of the brain.   Electronically Signed   By: Amie Portland M.D.   On: 08/10/2013 14:27     Assessment/Plan: 32 y/o with HTN, smoker, brought to [facility] ED for further evaluation of transient eft face-head pain, double vision, unresponsiveness, short of breath, intermittent shakiness left arm, and decreased grip in the left hand. Except for subtle left hand grip, her neuro-exam is unremarkable. CT brain normal. Can not entirely exclude a small right subcortical infarct, although his syndrome is difficult to localize to a specific segment of the neuro-axis. I recommended MRI brain to better define his left grip weakness but she is adamant she she wants to go home and " can not expend 1 more hour in this hospital". In that situation, advised to return to the ED if her symptoms change and also have follow up with her PCP as soon as possible to get MRI brain as outpatient if her symptoms persist. Aspirin 81 mg daily until reevaluation by PCP.  Wyatt Portela, MD [facility]  Neurohospitalist [phone]  08/10/2013, 3:41 PM

## 2013-08-10 NOTE — ED Provider Notes (Addendum)
CSN: [id number]     Arrival date & time 08/10/13  1257 History   None    Chief Complaint  Patient presents with  . Weakness  . Post-op Problem   (Consider location/radiation/quality/duration/timing/severity/associated sxs/prior Treatment) Patient is a 32 y.o. female presenting with weakness. The history is provided by the patient. No language interpreter was used.  Weakness The current episode started today. Associated symptoms include weakness. Pertinent negatives include no chest pain, diaphoresis, nausea, numbness or vomiting.   Patient is a 32 year old female, who presents from outpatient surgery Center by EMS. Per report from anesthesia, she had a syncopal episode and was briefly unresponsive with some difficulty breathing. It is thought per anesthesia report that she had a conversion reaction from her anesthesia. She reports that now her only complaint is a slight headache. She denies difficulty breathing, shortness of breath, nausea, vomiting. She is a smoker and has a history of hypertension. She denies ever having a stroke, TIA, PE or DVT.   Past Medical History  Diagnosis Date  . Psoriasis   . Hypertension   . Abnormal Pap smear     patient states she had "cervical cancer" at 32 yo for which she had removal of atypical cells.   . Ovarian cyst rupture    Past Surgical History  Procedure Laterality Date  . Cholecystectomy  2009  . Cesarean section  2009, 2011  . Cryotherapy    . Back surgery     Family History  Problem Relation Age of Onset  . Drug abuse Mother   . Heart disease Maternal Grandmother   . Heart disease Maternal Grandfather    History  Substance Use Topics  . Smoking status: Current Every Day Smoker -- 0.50 packs/day    Types: Cigarettes  . Smokeless tobacco: Never Used  . Alcohol Use: No   OB History   Grav Para Term Preterm Abortions TAB SAB Ect Mult Living   7 2 2  5  5   2      Review of Systems  Constitutional: Negative for diaphoresis.   Respiratory: Negative for chest tightness and shortness of breath.   Cardiovascular: Negative for chest pain.  Gastrointestinal: Negative for nausea and vomiting.  Neurological: Positive for weakness. Negative for speech difficulty and numbness.  Psychiatric/Behavioral: Negative for confusion.  All other systems reviewed and are negative.    Allergies  Penicillins  Home Medications   Current Outpatient Rx  Name  Route  Sig  Dispense  Refill  . amLODipine (NORVASC) 5 MG tablet   Oral   Take 1 tablet (5 mg total) by mouth daily.   30 tablet   3    BP 117/74  Pulse 78  Temp(Src) 98.1 F (36.7 C) (Oral)  Resp 20  Ht 5\' 6"  (1.676 m)  Wt 183 lb (83.008 kg)  BMI 29.55 kg/m2  SpO2 97%  LMP 08/10/2013 Physical Exam  Nursing note and vitals reviewed. Constitutional: She is oriented to person, place, and time. She appears well-developed and well-nourished. No distress.  HENT:  Head: Normocephalic and atraumatic.  Right Ear: External ear normal.  Left Ear: External ear normal.  Mouth/Throat: Oropharynx is clear and moist.  Eyes: Pupils are equal, round, and reactive to light.  Neck: Normal range of motion. Neck supple. No JVD present. No thyromegaly present.  Cardiovascular: Normal rate, regular rhythm, normal heart sounds and intact distal pulses.   Pulmonary/Chest: Effort normal and breath sounds normal. No respiratory distress. She has no wheezes.  Abdominal:  Soft. Bowel sounds are normal.  Musculoskeletal: Normal range of motion.  Neurological: She is alert and oriented to person, place, and time. No cranial nerve deficit or sensory deficit. GCS eye subscore is 4. GCS verbal subscore is 5. GCS motor subscore is 6.  Left grip weaker than right.   Skin: Skin is warm and dry.  Psychiatric: Her speech is normal and behavior is normal. Thought content normal. Her mood appears anxious.   2:48 PM Vitals stable. No difficulty breathing or shortness of breath. Dull headache.  Left grips weak. Neurology consulted and will come and see pt.  ED Course  Procedures (including critical care time) Labs Review Labs Reviewed  GLUCOSE, CAPILLARY - Abnormal; Notable for the following:    Glucose-Capillary 115 (*)    All other components within normal limits   Imaging Review No results found.  EKG Interpretation   None       MDM   1. Weakness of left side of body     S/P resection of lipoma. Anesthesia sent to ER for Neurology consult. Questionable reaction from anesthesia. Awake and alert, no numbness or tingling, no speech difficulty. Weak left grip. Mild headache. Neurology will see in ER and dispo.    Seen by Neurology, OK to discharge home. Resolving weakness in left hand/grip. Return if symptoms worsen. Pt agrees to plan.  Irish Elders, NP 08/10/13 1455  Irish Elders, NP 08/10/13 [phone]

## 2013-08-10 NOTE — ED Provider Notes (Signed)
Medical screening examination/treatment/procedure(s) were performed by non-physician practitioner and as supervising physician I was immediately available for consultation/collaboration.   Nelia Shi, MD 08/10/13 [phone]

## 2013-08-10 NOTE — ED Notes (Signed)
To ED via EMS from day surgery center, per EMS, pt had a syncopal episode and was briefly unresponsive with difficulty breathing, was given 2 Alb treatments, 20 mg Dexamethasone IV and 0.5 mg Versed IV, arrives with significant left grip weakness, VSS, 22g left wrist, c/o HA and facial pain

## 2013-08-10 NOTE — ED Notes (Signed)
Discharge orders given.

## 2013-08-10 NOTE — ED Notes (Signed)
Neurology at the bedside

## 2013-08-11 NOTE — ED Provider Notes (Signed)
Medical screening examination/treatment/procedure(s) were performed by non-physician practitioner and as supervising physician I was immediately available for consultation/collaboration.    Gray Maugeri L Temitayo Covalt, MD 08/11/13 1119 

## 2013-08-13 ENCOUNTER — Telehealth (INDEPENDENT_AMBULATORY_CARE_PROVIDER_SITE_OTHER): Payer: Self-pay | Admitting: *Deleted

## 2013-08-13 NOTE — Telephone Encounter (Signed)
LMOM for pt to return my call.  I was calling to inform pt of her PO appt with Dr. Derrell Lolling on 10/28 @ 11:00am.

## 2013-08-14 ENCOUNTER — Telehealth (INDEPENDENT_AMBULATORY_CARE_PROVIDER_SITE_OTHER): Payer: Self-pay | Admitting: General Surgery

## 2013-08-14 NOTE — Telephone Encounter (Signed)
Message copied by June Leap on Tue Aug 14, 2013 10:41 AM ------      Message from: Axel Filler      Created: Mon Aug 13, 2013  4:58 PM       Can you call her and see if she's doing OK      She had a small lipoma removed and then had stroke like sxs in the PACU and was sent to the [facility] ED.  She ended going home Friday before i could go over and see her.  But wanted to know if she was doing OK and if the stregnth in her arm was better.            Thanks      AR ------

## 2013-08-14 NOTE — Telephone Encounter (Signed)
LMOM @10 :41 asking pt to return my call

## 2013-08-15 NOTE — Telephone Encounter (Signed)
LMOM @10 :06 for pt to return my call on if she if feeling any better and if the strength in her arm is any better

## 2013-08-16 NOTE — Telephone Encounter (Signed)
LMOM @1 :36 asking pt to return my call

## 2013-08-17 NOTE — Telephone Encounter (Addendum)
Patient states she is still not feeling any better and she is not able to work without having dizziness. I Advised her to call Her PCP about her dizziness   And continue the tylenol as directed. I would inform DR. Ramiez

## 2013-08-20 NOTE — Telephone Encounter (Signed)
DR. Derrell Lolling Advised me to inform patient she needed to make appointment to see her PCP. I advise the patient,Im  not sure if she will follow thru with recommendation

## 2013-08-20 NOTE — Telephone Encounter (Signed)
Patient states she is now having left back pain and dizziness . I ask her she saw her PCP for her dizziness . She stated no because she thought the dizziness was coming from where she had surgery. I advised her pain would not cause dizziness. I expressed I would inform Dr. Derrell Lolling today and should get a call

## 2013-08-22 ENCOUNTER — Ambulatory Visit (INDEPENDENT_AMBULATORY_CARE_PROVIDER_SITE_OTHER): Payer: Medicaid Other | Admitting: Family Medicine

## 2013-08-22 ENCOUNTER — Encounter: Payer: Self-pay | Admitting: Family Medicine

## 2013-08-22 VITALS — BP 128/92 | HR 86 | Temp 98.2°F | Ht 66.0 in | Wt 192.0 lb

## 2013-08-22 DIAGNOSIS — B029 Zoster without complications: Secondary | ICD-10-CM | POA: Insufficient documentation

## 2013-08-22 MED ORDER — VALACYCLOVIR HCL 1 G PO TABS: 1000.0000 mg | ORAL_TABLET | Freq: Three times a day (TID) | ORAL | Status: DC

## 2013-08-22 MED ORDER — ONDANSETRON HCL 4 MG/5ML PO SOLN: 4.0000 mg | Freq: Two times a day (BID) | ORAL | Status: DC | PRN

## 2013-08-22 MED ORDER — OXYCODONE HCL 5 MG/5ML PO SOLN: 5.0000 mg | ORAL | Status: DC | PRN

## 2013-08-22 NOTE — Patient Instructions (Addendum)
Thank you for coming in, today!  I think you have an infection called shingles on your back. This is caused by the same virus that causes chickenpox. It can come up after a stressful situation like surgery.  You should take a medication called Valtrex three times a day for 7 days for the infection. You can take oxycodone solution for pain, every 4 hours as needed. The oxycodone may make you very sleepy. You can take Zofran as needed for nausea.  Come back to see Dr. Gwenlyn Saran in 1-2 weeks or sooner if you aren't feeling any better.  Please feel free to call with any questions or concerns at any time, at [phone]. --Dr. Casper Harrison  Shingles Shingles (herpes zoster) is an infection that is caused by the same virus that causes chickenpox (varicella). The infection causes a painful skin rash and fluid-filled blisters, which eventually break open, crust over, and heal. It may occur in any area of the body, but it usually affects only one side of the body or face. The pain of shingles usually lasts about 1 month. However, some people with shingles may develop long-term (chronic) pain in the affected area of the body. Shingles often occurs many years after the person had chickenpox. It is more common:  In people older than 50 years.  In people with weakened immune systems, such as those with HIV, AIDS, or cancer.  In people taking medicines that weaken the immune system, such as transplant medicines.  In people under great stress. CAUSES  Shingles is caused by the varicella zoster virus (VZV), which also causes chickenpox. After a person is infected with the virus, it can remain in the person's body for years in an inactive state (dormant). To cause shingles, the virus reactivates and breaks out as an infection in a nerve root. The virus can be spread from person to person (contagious) through contact with open blisters of the shingles rash. It will only spread to people who have not had chickenpox.  When these people are exposed to the virus, they may develop chickenpox. They will not develop shingles. Once the blisters scab over, the person is no longer contagious and cannot spread the virus to others. SYMPTOMS  Shingles shows up in stages. The initial symptoms may be pain, itching, and tingling in an area of the skin. This pain is usually described as burning, stabbing, or throbbing.In a few days or weeks, a painful red rash will appear in the area where the pain, itching, and tingling were felt. The rash is usually on one side of the body in a band or belt-like pattern. Then, the rash usually turns into fluid-filled blisters. They will scab over and dry up in approximately 2 3 weeks. Flu-like symptoms may also occur with the initial symptoms, the rash, or the blisters. These may include:  Fever.  Chills.  Headache.  Upset stomach. DIAGNOSIS  Your caregiver will perform a skin exam to diagnose shingles. Skin scrapings or fluid samples may also be taken from the blisters. This sample will be examined under a microscope or sent to a lab for further testing. TREATMENT  There is no specific cure for shingles. Your caregiver will likely prescribe medicines to help you manage the pain, recover faster, and avoid long-term problems. This may include antiviral drugs, anti-inflammatory drugs, and pain medicines. HOME CARE INSTRUCTIONS   Take a cool bath or apply cool compresses to the area of the rash or blisters as directed. This may help with the pain  and itching.   Only take over-the-counter or prescription medicines as directed by your caregiver.   Rest as directed by your caregiver.  Keep your rash and blisters clean with mild soap and cool water or as directed by your caregiver.  Do not pick your blisters or scratch your rash. Apply an anti-itch cream or numbing creams to the affected area as directed by your caregiver.  Keep your shingles rash covered with a loose bandage  (dressing).  Avoid skin contact with:  Babies.   Pregnant women.   Children with eczema.   Elderly people with transplants.   People with chronic illnesses, such as leukemia or AIDS.   Wear loose-fitting clothing to help ease the pain of material rubbing against the rash.  Keep all follow-up appointments with your caregiver.If the area involved is on your face, you may receive a referral for follow-up to a specialist, such as an eye doctor (ophthalmologist) or an ear, nose, and throat (ENT) doctor. Keeping all follow-up appointments will help you avoid eye complications, chronic pain, or disability.  SEEK IMMEDIATE MEDICAL CARE IF:   You have facial pain, pain around the eye area, or loss of feeling on one side of your face.  You have ear pain or ringing in your ear.  You have loss of taste.  Your pain is not relieved with prescribed medicines.   Your redness or swelling spreads.   You have more pain and swelling.  Your condition is worsening or has changed.   You have a feveror persistent symptoms for more than 2 3 days.  You have a fever and your symptoms suddenly get worse. MAKE SURE YOU:  Understand these instructions.  Will watch your condition.  Will get help right away if you are not doing well or get worse. Document Released: 10/18/2005 Document Revised: 07/12/2012 Document Reviewed: 06/01/2012 [facility] Patient Information 2014 [place], [place].

## 2013-08-22 NOTE — Assessment & Plan Note (Signed)
Discussed with Dr. Armen Pickup. Very likely shingles (one-sided rash, extremely tender, vesicular appearance on red base, dermatomal distribution, etc), re-activated by stress of surgery/reaction to anaesthesia. No neuro symptoms or strength/sensation deficit to suggest stroke; no obvious cause for this per review of available notes in EPIC.  Possible that constellation of symptoms (back pain, nausea/vomiting, dizziness) are all related to pain of shingles and poor intake/losses from vomiting.  Rx for Valtrex for 7 days, oxycodone solution for pain, and Zofran solution for nausea. Instructed pt that pain medication may potentially make her nausea/dizziness worse and may make her very sleepy. Defer MRI for now as frank stroke seems unlikely, though TIA is still a possible explanation for her immediate symptoms after surgery (left-sided ?twitching, weakness). Instructed pt to f/u with surgeon as directed, and to f/u with PCP Dr. Gwenlyn Saran in 1-2 weeks, or sooner if symptoms are not improving.

## 2013-08-22 NOTE — Progress Notes (Signed)
  Subjective:    Patient ID: [name], female    DOB: May 09, 1981, 32 y.o.   MRN: [id number]  HPI: Pt presents to clinic for f/u on "surgical complications" from removal of a lipoma on her left back/flank on 10/10. Pt reports she had a "bad reaction to anaesthesia" in the PACU at the surgery center (she does not recall the event, but she states she had an episode of left-sided body weakness/twitching and a brief period of unresponsiveness; corroborated by notes in EPIC). She was sent to [facility] ED and had a normal CT scan but neuro was consulted for subtle left grip strength weakness. Neuro recommended an MRI but she refused, and neurologist's note recommends outpt MRI if indicated (Dr. Cyril Mourning).  Since then, pt has had continuous back pain, occasional dizziness, and intermittent nausea/vomiting, almost every day. Pt has contacted her surgeon and was told to follow up with her PCP here. Symptoms are no better/different since onset but she states she has not noticed any more left-sided weakness or had any periods of confusion/disorientation or unresponsiveness to her knowledge. She has been prescribed Percocet for pain after the surgery but has only taken it a few times (has difficulty with large pills), and her symptoms started before taking any Percocet. She otherwise takes only Norvasc for blood pressure. Changes in position do not seem to make her symptoms any worse. She has had decreased appetite and fluid intake with the nausea, however.  She has a small rash on the left side of her back inferior to the surgical scar which she is unsure of when it came up. The area is very tender with a burning component to the pain. She has had chickenpox in the past. She is uncertain if she has ever had shingles but states she has psoriasis and never knows exactly if/when she has new or "different" rash. She does state she has questioned if she had shingles in the past, for a similar one-sided rash on the other  other hip.  Review of Systems: As above. Otherwise feels "okay" but is very frustrated.     Objective:   Physical Exam BP 128/92  Pulse 86  Temp(Src) 98.2 F (36.8 C) (Oral)  Ht 5\' 6"  (1.676 m)  Wt 192 lb (87.091 kg)  BMI 31 kg/m2  LMP 08/10/2013  BP (intial): 148/94 Orthostatic vitals:  lying: 138/89, pulse 84  sitting: 138/96, pulse 83  standing: 128/92, pulse 86 Gen: well but uncomfortable-appearing adult female in NAD HEENT: TM's clear bilaterally, EOMI, PERRLA, no nystagmus, MMM Neuro: no gross focal deficits, alert/oriented, strength normal bilaterally in both upper and lower ext  Able to sit/stand/ambulate without assistance MSK: normal strength throughout Skin: warm, dry, scattered areas of dryness/scale consistent with pt's reported "normal" psoriasis  To left flank, approx 3 cm well-approximated, well-healing scar from surgery as above, appropriately mildly tender  No bleeding/drainage, induration, or redness/streaking surrounding scar to suggest abscess or cellulitis  Inferior to scar, approx 3cm vertical by 7cm transverse reddish patch with many small vesicular lesions, very tender even to light touch  Rash inferior to scar unilateral and dermatomal in distribution/location     Assessment & Plan:

## 2013-08-28 ENCOUNTER — Encounter (INDEPENDENT_AMBULATORY_CARE_PROVIDER_SITE_OTHER): Payer: Medicaid Other | Admitting: General Surgery

## 2013-09-04 ENCOUNTER — Encounter (INDEPENDENT_AMBULATORY_CARE_PROVIDER_SITE_OTHER): Payer: Self-pay | Admitting: General Surgery

## 2013-09-11 ENCOUNTER — Ambulatory Visit: Payer: Medicaid Other | Admitting: Family Medicine

## 2013-09-26 ENCOUNTER — Encounter: Payer: Self-pay | Admitting: Family Medicine

## 2013-09-26 ENCOUNTER — Ambulatory Visit (INDEPENDENT_AMBULATORY_CARE_PROVIDER_SITE_OTHER): Payer: Medicaid Other | Admitting: Family Medicine

## 2013-09-26 VITALS — BP 154/97 | HR 77 | Ht 66.0 in | Wt 195.0 lb

## 2013-09-26 DIAGNOSIS — L408 Other psoriasis: Secondary | ICD-10-CM

## 2013-09-26 DIAGNOSIS — R42 Dizziness and giddiness: Secondary | ICD-10-CM

## 2013-09-26 DIAGNOSIS — L409 Psoriasis, unspecified: Secondary | ICD-10-CM

## 2013-09-26 DIAGNOSIS — B029 Zoster without complications: Secondary | ICD-10-CM

## 2013-09-26 DIAGNOSIS — R110 Nausea: Secondary | ICD-10-CM

## 2013-09-26 LAB — POCT URINE PREGNANCY: Preg Test, Ur: NEGATIVE

## 2013-09-26 MED ORDER — GABAPENTIN 300 MG PO CAPS: 300.0000 mg | ORAL_CAPSULE | Freq: Three times a day (TID) | ORAL | Status: DC

## 2013-09-26 NOTE — Patient Instructions (Signed)
Start taking the gabapentin to see if this helps with your pain.  If you do not feel any improvement of symptoms, return to see me.   Follow up after the MRI was done so that we can review the results together in clinic.

## 2013-10-01 DIAGNOSIS — L409 Psoriasis, unspecified: Secondary | ICD-10-CM | POA: Insufficient documentation

## 2013-10-01 DIAGNOSIS — R42 Dizziness and giddiness: Secondary | ICD-10-CM | POA: Insufficient documentation

## 2013-10-01 NOTE — Assessment & Plan Note (Signed)
-   Psoriasis: extensive skin involvement. Refer to dermatology.

## 2013-10-01 NOTE — Assessment & Plan Note (Signed)
-   Still with pain. Unclear if the skin lesions are residual shingles or psoriasis. At any rate, will try gabapentin for associated nerve pain.

## 2013-10-01 NOTE — Progress Notes (Signed)
Patient ID: [name]    DOB: 05-03-1981, 32 y.o.   MRN: [id number] --- Subjective:  [name] is a 32 y.o.female who presents for follow up on the following: - f/u shingles: diagnosed with shingles at her last visit. Was prescribed Valtrex for 1 week which she took without significant improvement. She describes the pain around her left flank and right, going up and down, burning pain, intermittent, every day. Advil helps. Takes 4-6 advil a day for the pain. The rash associated with it has since cleared. She has a couple lesions on her back that she thinks look more like her psoriasis.  - Dizziness and light headedness: since her surgery. She had an event after anesthesia where she had described left sided weakness and slurred speech and thought to have a stroke. She had a CT which was normal. An MRI was recommended. She did not get it because of confusion regarding what service was responsible for ordering the MRI (per patient's report). Since then, she has had dizziness with closing her eyes and without. It occurs at rest as well. Not every day. No weakness. No slurred speech. No change in vision.  - Psoriasis: rash on legs, back, arms, scalp. Itchy. Has had it since she was in her 67's. used to see a dermatologist but has not seen one in a while. Used to use topical cream but this has not helped.   ROS: see HPI Past Medical History: reviewed and updated medications and allergies. Social History: Tobacco: current smoker  Objective: Filed Vitals:   09/26/13 1537  BP: 154/97  Pulse: 77    Physical Examination:   General appearance - alert, well appearing, and in no distress Neuro: CN2-12 grossly intact, normal strength in upper and lower extremities. Negative rhomberg, no pronator drift.  Skin: large silver/purplish scaly plaques on legs. Small papules scattered on back and arms.  Well healed scar on left flank

## 2013-10-01 NOTE — Assessment & Plan Note (Signed)
Dizziness: in setting of event a few weeks ago that sounded like TIA vs stroke in setting of anesthesia. No focal findings on exam. However, given persistent symptoms and recommendation for MRI by neuro at the time of her evaluation when she had event s/p surgery, will obtain MRI. Patient to return for result review.

## 2013-10-03 ENCOUNTER — Telehealth: Payer: Self-pay | Admitting: Family Medicine

## 2013-10-03 NOTE — Telephone Encounter (Signed)
Called and gave patient negative pregnancy results.Malvika Tung, Rodena Medin

## 2013-10-03 NOTE — Telephone Encounter (Signed)
Would like results for pregnancy test Needs it before she has MRI tomorrow

## 2013-10-04 ENCOUNTER — Ambulatory Visit
Admission: RE | Admit: 2013-10-04 | Discharge: 2013-10-04 | Disposition: A | Discharge status: Home / Self Care | Payer: Medicaid Other | Source: Ambulatory Visit | Attending: Family Medicine | Admitting: Family Medicine

## 2013-10-04 DIAGNOSIS — R42 Dizziness and giddiness: Secondary | ICD-10-CM

## 2013-10-08 ENCOUNTER — Ambulatory Visit
Admission: RE | Admit: 2013-10-08 | Discharge: 2013-10-08 | Disposition: A | Discharge status: Home / Self Care | Payer: Medicaid Other | Source: Ambulatory Visit | Attending: Family Medicine | Admitting: Family Medicine

## 2013-10-10 ENCOUNTER — Encounter: Payer: Self-pay | Admitting: Family Medicine

## 2013-10-10 ENCOUNTER — Ambulatory Visit (INDEPENDENT_AMBULATORY_CARE_PROVIDER_SITE_OTHER): Payer: Medicaid Other | Admitting: Family Medicine

## 2013-10-10 VITALS — BP 136/94 | HR 85 | Ht 66.0 in | Wt 196.0 lb

## 2013-10-10 DIAGNOSIS — L408 Other psoriasis: Secondary | ICD-10-CM

## 2013-10-10 DIAGNOSIS — R42 Dizziness and giddiness: Secondary | ICD-10-CM

## 2013-10-10 DIAGNOSIS — B029 Zoster without complications: Secondary | ICD-10-CM

## 2013-10-10 DIAGNOSIS — L409 Psoriasis, unspecified: Secondary | ICD-10-CM

## 2013-10-10 MED ORDER — MECLIZINE HCL 32 MG PO TABS: 32.0000 mg | ORAL_TABLET | Freq: Three times a day (TID) | ORAL | Status: DC | PRN

## 2013-10-10 MED ORDER — CLOBETASOL PROPIONATE 0.05 % EX OINT: 1.0000 "application " | TOPICAL_OINTMENT | Freq: Two times a day (BID) | CUTANEOUS | Status: DC

## 2013-10-10 NOTE — Patient Instructions (Addendum)
Keep taking the gabapentin for now until you are seen by dermatology. For the dizziness try doing the exercises and getting you. You can also take the Menest and in case of severe episodes. If the dizziness continues to be a problem, please follow up with me in one to 2 months. A few decides to stop the gabapentin after seeing dermatology, take gabapentin every other day for one week, and then take it every 3 days for one week until he stopped taking it altogether.  Benign Positional Vertigo Vertigo means you feel like you or your surroundings are moving when they are not. Benign positional vertigo is the most common form of vertigo. Benign means that the cause of your condition is not serious. Benign positional vertigo is more common in older adults. CAUSES  Benign positional vertigo is the result of an upset in the labyrinth system. This is an area in the middle ear that helps control your balance. This may be caused by a viral infection, head injury, or repetitive motion. However, often no specific cause is found. SYMPTOMS  Symptoms of benign positional vertigo occur when you move your head or eyes in different directions. Some of the symptoms may include:  Loss of balance and falls.  Vomiting.  Blurred vision.  Dizziness.  Nausea.  Involuntary eye movements (nystagmus). DIAGNOSIS  Benign positional vertigo is usually diagnosed by physical exam. If the specific cause of your benign positional vertigo is unknown, your caregiver may perform imaging tests, such as magnetic resonance imaging (MRI) or computed tomography (CT). TREATMENT  Your caregiver may recommend movements or procedures to correct the benign positional vertigo. Medicines such as meclizine, benzodiazepines, and medicines for nausea may be used to treat your symptoms. In rare cases, if your symptoms are caused by certain conditions that affect the inner ear, you may need surgery. HOME CARE INSTRUCTIONS   Follow your  caregiver's instructions.  Move slowly. Do not make sudden body or head movements.  Avoid driving.  Avoid operating heavy machinery.  Avoid performing any tasks that would be dangerous to you or others during a vertigo episode.  Drink enough fluids to keep your urine clear or pale yellow. SEEK IMMEDIATE MEDICAL CARE IF:   You develop problems with walking, weakness, numbness, or using your arms, hands, or legs.  You have difficulty speaking.  You develop severe headaches.  Your nausea or vomiting continues or gets worse.  You develop visual changes.  Your family or friends notice any behavioral changes.  Your condition gets worse.  You have a fever.  You develop a stiff neck or sensitivity to light. MAKE SURE YOU:   Understand these instructions.  Will watch your condition.  Will get help right away if you are not doing well or get worse. Document Released: 07/26/2006 Document Revised: 01/10/2012 Document Reviewed: 07/08/2011 [facility] Patient Information 2014 [place], [place].

## 2013-10-11 NOTE — Assessment & Plan Note (Signed)
Continue gabapentin for now. Would like patient to be evaluated by dermatology for psoriasis until changing any current treatment. However, If patient does decide to stop the gabapentin, I have instructed her on a tapering regimen.

## 2013-10-11 NOTE — Assessment & Plan Note (Signed)
MRI normal ruling out stroke as etiology of dizziness. BPPV as possible cause. -Gave handout on Epley maneuver to do at home -Meclizine prescriptions for prolonged dizziness although given short lived episodes this is less likely to be useful. - If persistent symptoms despite home exercises, will consider referring her to vestibular rehabilitation

## 2013-10-11 NOTE — Assessment & Plan Note (Addendum)
We'll try treating plaques with high dose steroid ointment until she is seen by dermatology. Rx sent for clobetasol ointment.

## 2013-10-11 NOTE — Progress Notes (Signed)
Patient ID: [name]    DOB: [birth year], 32 y.o.   MRN: [id number] --- Subjective:  [name] is a 32 y.o.female who presents for followup on MRI. -Dizziness: An MRI brain done 2 days ago to rule out infarct after persistent dizziness that started after she had generalized anesthesia for surgical procedure in October. Right after surgery she had developed one-sided weakness and had not had MRI to evaluate symptoms further. Recent MRI was normal. She continues to have dizziness which he describes as feeling like the room is spinning. No ringing in the ears. It occurs both with movements and at rest. It is associated with nausea. She denies any associated headache. No change in vision. The symptoms lasted 5 minutes. It occurs 3-4 times a week.  - Followup on shingles: Has been diagnosed with shingles. She had continued to have pain, burning. She was started on gabapentin on 09/26/2013. She states that her pain has resolved since starting the gabapentin at nighttime. Gabapentin has not been worsening her dizziness.  - Psoriasis: She has not yet been able to been seen by a dermatologist. She continues to have large scaly plaques neck cause a lot of itching. She has tried her daughter's steroid cream which has not helped.  ROS: see HPI Past Medical History: reviewed and updated medications and allergies. Social History: Tobacco: smoker  Objective: Filed Vitals:   10/10/13 0929  BP: 136/94  Pulse: 85    Physical Examination:   General appearance - alert, well appearing, and in no distress Neuro-cranial nerves II through XII grossly intact, Dix-Hallpike positive for onset of dizziness on the right side.  Heart - normal rate, regular rhythm, normal S1, S2, no murmurs Skin-large thickened ,purpleish plaques on legs bilaterally, small scattered papules on arms and back. Cluster of healed up vesicles on left flank and right flank along the same dermatome pattern.

## 2013-10-13 ENCOUNTER — Other Ambulatory Visit: Payer: Medicaid Other

## 2013-11-26 ENCOUNTER — Ambulatory Visit (INDEPENDENT_AMBULATORY_CARE_PROVIDER_SITE_OTHER): Payer: Medicaid Other | Admitting: Family Medicine

## 2013-11-26 ENCOUNTER — Encounter: Payer: Self-pay | Admitting: Family Medicine

## 2013-11-26 VITALS — BP 156/92 | HR 80 | Temp 97.7°F | Ht 66.0 in | Wt 192.0 lb

## 2013-11-26 DIAGNOSIS — N912 Amenorrhea, unspecified: Secondary | ICD-10-CM

## 2013-11-26 DIAGNOSIS — Z3480 Encounter for supervision of other normal pregnancy, unspecified trimester: Secondary | ICD-10-CM

## 2013-11-26 DIAGNOSIS — I10 Essential (primary) hypertension: Secondary | ICD-10-CM

## 2013-11-26 LAB — POCT URINE PREGNANCY: Preg Test, Ur: POSITIVE

## 2013-11-26 MED ORDER — LABETALOL HCL 100 MG PO TABS: 100.0000 mg | ORAL_TABLET | Freq: Two times a day (BID) | ORAL | Status: DC

## 2013-11-26 NOTE — Progress Notes (Signed)
Patient ID: [name]    DOB: Oct 29, 1981, 33 y.o.   MRN: [id number] --- Subjective:  [name] is a 33 y.o.female with h/o psoriasis, recent shingles who presents for evaluation of oligomenorrhea. Patient's LMP was 10/01/13. She normally has regular periods. She has them every 28 days or so.  Pregnancy test in the office is positive.  She states that she thought this may be the case. She is not on birth control medicine. She stopped taking gabapentin on January 1st in case she was pregnant. She is still taking amlodipine. She was prescribed humera for psoriasis by her dermatologist but had not yet had the medicine approved.  She currently smokes 1/2 pack per day but wants to quit. No alcohol.  She is a [id number]. Had 7 miscarriages. Last pregnancy was in 2011 which resulted in live birth. Her children are 3 and 5yo. She is followed at [place]: see HPI Past Medical History: reviewed and updated medications and allergies. Social History: Tobacco: 1/2 pack per day  Objective: Filed Vitals:   11/26/13 0839  BP: 156/92  Pulse: 80  Temp: 97.7 F (36.5 C)    Physical Examination:   General appearance - alert, well appearing, and in no distress Mouth - mucous membranes moist, pharynx normal without lesions Neck - supple, no significant adenopathy Chest - clear to auscultation, no wheezes, rales or rhonchi, symmetric air entry Heart - normal rate, regular rhythm, normal S1, S2, no murmurs, rubs, clicks or gallops Abdomen - soft, nontender

## 2013-11-26 NOTE — Patient Instructions (Signed)
The pregnancy test was positive. It will be important to follow up with your OB doctor in the next 2 weeks. Your are [redacted] weeks pregnant today  Quitting smoking is a very important thing.  The new blood pressure medicine: labetalol 100mg  twice a day was sent. Stop taking amlodipine.   Pregnancy - First Trimester During sexual intercourse, millions of sperm go into the vagina. Only 1 sperm will penetrate and fertilize the female egg while it is in the Fallopian tube. One week later, the fertilized egg implants into the wall of the uterus. An embryo begins to develop into a baby. At 6 to 8 weeks, the eyes and face are formed and the heartbeat can be seen on ultrasound. At the end of 12 weeks (first trimester), all the baby's organs are formed. Now that you are pregnant, you will want to do everything you can to have a healthy baby. Two of the most important things are to get good prenatal care and follow your caregiver's instructions. Prenatal care is all the medical care you receive before the baby's birth. It is given to prevent, find, and treat problems during the pregnancy and childbirth. PRENATAL EXAMS  During prenatal visits, your weight, blood pressure, and urine are checked. This is done to make sure you are healthy and progressing normally during the pregnancy.  A pregnant woman should gain 25 to 35 pounds during the pregnancy. However, if you are overweight or underweight, your caregiver will advise you regarding your weight.  Your caregiver will ask and answer questions for you.  Blood work, cervical cultures, other necessary tests, and a Pap test are done during your prenatal exams. These tests are done to check on your health and the probable health of your baby. Tests are strongly recommended and done for HIV with your permission. This is the virus that causes AIDS. These tests are done because medicines can be given to help prevent your baby from being born with this infection should you  have been infected without knowing it. Blood work is also used to find out your blood type, previous infections, and follow your blood levels (hemoglobin).  Low hemoglobin (anemia) is common during pregnancy. Iron and vitamins are given to help prevent this. Later in the pregnancy, blood tests for diabetes will be done along with any other tests if any problems develop.  You may need other tests to make sure you and the baby are doing well. CHANGES DURING THE FIRST TRIMESTER  Your body goes through many changes during pregnancy. They vary from person to person. Talk to your caregiver about changes you notice and are concerned about. Changes can include:  Your menstrual period stops.  The egg and sperm carry the genes that determine what you look like. Genes from you and your partner are forming a baby. The female genes determine whether the baby is a boy or a girl.  Your body increases in girth and you may feel bloated.  Feeling sick to your stomach (nauseous) and throwing up (vomiting). If the vomiting is uncontrollable, call your caregiver.  Your breasts will begin to enlarge and become tender.  Your nipples may stick out more and become darker.  The need to urinate more. Painful urination may mean you have a bladder infection.  Tiring easily.  Loss of appetite.  Cravings for certain kinds of food.  At first, you may gain or lose a couple of pounds.  You may have changes in your emotions from day to day (  excited to be pregnant or concerned something may go wrong with the pregnancy and baby).  You may have more vivid and strange dreams. HOME CARE INSTRUCTIONS   It is very important to avoid all smoking, alcohol and non-prescribed drugs during your pregnancy. These affect the formation and growth of the baby. Avoid chemicals while pregnant to ensure the delivery of a healthy infant.  Start your prenatal visits by the 12th week of pregnancy. They are usually scheduled monthly at  first, then more often in the last 2 months before delivery. Keep your caregiver's appointments. Follow your caregiver's instructions regarding medicine use, blood and lab tests, exercise, and diet.  During pregnancy, you are providing food for you and your baby. Eat regular, well-balanced meals. Choose foods such as meat, fish, milk and other low fat dairy products, vegetables, fruits, and whole-grain breads and cereals. Your caregiver will tell you of the ideal weight gain.  You can help morning sickness by keeping soda crackers at the bedside. Eat a couple before arising in the morning. You may want to use the crackers without salt on them.  Eating 4 to 5 small meals rather than 3 large meals a day also may help the nausea and vomiting.  Drinking liquids between meals instead of during meals also seems to help nausea and vomiting.  A physical sexual relationship may be continued throughout pregnancy if there are no other problems. Problems may be early (premature) leaking of amniotic fluid from the membranes, vaginal bleeding, or belly (abdominal) pain.  Exercise regularly if there are no restrictions. Check with your caregiver or physical therapist if you are unsure of the safety of some of your exercises. Greater weight gain will occur in the last 2 trimesters of pregnancy. Exercising will help:  Control your weight.  Keep you in shape.  Prepare you for labor and delivery.  Help you lose your pregnancy weight after you deliver your baby.  Wear a good support or jogging bra for breast tenderness during pregnancy. This may help if worn during sleep too.  Ask when prenatal classes are available. Begin classes when they are offered.  Do not use hot tubs, steam rooms, or saunas.  Wear your seat belt when driving. This protects you and your baby if you are in an accident.  Avoid raw meat, uncooked cheese, cat litter boxes, and soil used by cats throughout the pregnancy. These carry germs  that can cause birth defects in the baby.  The first trimester is a good time to visit your dentist for your dental health. Getting your teeth cleaned is okay. Use a softer toothbrush and brush gently during pregnancy.  Ask for help if you have financial, counseling, or nutritional needs during pregnancy. Your caregiver will be able to offer counseling for these needs as well as refer you for other special needs.  Do not take any medicines or herbs unless told by your caregiver.  Inform your caregiver if there is any mental or physical domestic violence.  Make a list of emergency phone numbers of family, friends, hospital, and police and fire departments.  Write down your questions. Take them to your prenatal visit.  Do not douche.  Do not cross your legs.  If you have to stand for long periods of time, rotate you feet or take small steps in a circle.  You may have more vaginal secretions that may require a sanitary pad. Do not use tampons or scented sanitary pads. MEDICINES AND DRUG USE IN PREGNANCY  Take prenatal vitamins as directed. The vitamin should contain 1 milligram of folic acid. Keep all vitamins out of reach of children. Only a couple vitamins or tablets containing iron may be fatal to a baby or young child when ingested.  Avoid use of all medicines, including herbs, over-the-counter medicines, not prescribed or suggested by your caregiver. Only take over-the-counter or prescription medicines for pain, discomfort, or fever as directed by your caregiver. Do not use aspirin, ibuprofen, or naproxen unless directed by your caregiver.  Let your caregiver also know about herbs you may be using.  Alcohol is related to a number of birth defects. This includes fetal alcohol syndrome. All alcohol, in any form, should be avoided completely. Smoking will cause low birth rate and premature babies.  Street or illegal drugs are very harmful to the baby. They are absolutely forbidden. A  baby born to an addicted mother will be addicted at birth. The baby will go through the same withdrawal an adult does.  Let your caregiver know about any medicines that you have to take and for what reason you take them. SEEK MEDICAL CARE IF:  You have any concerns or worries during your pregnancy. It is better to call with your questions if you feel they cannot wait, rather than worry about them. SEEK IMMEDIATE MEDICAL CARE IF:   An unexplained oral temperature above 102 F (38.9 C) develops, or as your caregiver suggests.  You have leaking of fluid from the vagina (birth canal). If leaking membranes are suspected, take your temperature and inform your caregiver of this when you call.  There is vaginal spotting or bleeding. Notify your caregiver of the amount and how many pads are used.  You develop a bad smelling vaginal discharge with a change in the color.  You continue to feel sick to your stomach (nauseated) and have no relief from remedies suggested. You vomit blood or coffee ground-like materials.  You lose more than 2 pounds of weight in 1 week.  You gain more than 2 pounds of weight in 1 week and you notice swelling of your face, hands, feet, or legs.  You gain 5 pounds or more in 1 week (even if you do not have swelling of your hands, face, legs, or feet).  You get exposed to [place] measles and have never had them.  You are exposed to fifth disease or chickenpox.  You develop belly (abdominal) pain. Round ligament discomfort is a common non-cancerous (benign) cause of abdominal pain in pregnancy. Your caregiver still must evaluate this.  You develop headache, fever, diarrhea, pain with urination, or shortness of breath.  You fall or are in a car accident or have any kind of trauma.  There is mental or physical violence in your home. Document Released: 10/12/2001 Document Revised: 07/12/2012 Document Reviewed: 04/15/2009 [facility] Patient Information 2014 [employer]  [place].

## 2013-11-27 DIAGNOSIS — Z3201 Encounter for pregnancy test, result positive: Secondary | ICD-10-CM | POA: Insufficient documentation

## 2013-11-27 MED ORDER — GNP PRENATAL VITAMINS 28-0.8 MG PO TABS
1.0000 | ORAL_TABLET | Freq: Every day | ORAL | Status: DC
Start: 2013-11-27 — End: 2014-04-24

## 2013-11-27 NOTE — Assessment & Plan Note (Addendum)
Positive UPT. [id number] at [place] by LMP.  - urged her to quit smoking - do not start humera. Patient states her psoriasis is better since she has been pregnant.  - Patient to follow up with her OB physician - Has history of hypertension on amlodipine 5mg . Since not studied in pregnancy, will stop amlodipine and start her on labetalol 100mg  bid. Instructed patient to follow up on HTN with OB in 2 weeks.  - will prescribe prenatal vitamins

## 2013-11-27 NOTE — Assessment & Plan Note (Signed)
Has history of hypertension on amlodipine 5mg . Since not studied in pregnancy, will stop amlodipine and start her on labetalol 100mg  bid. Instructed patient to follow up on HTN with OB in 2 weeks. If unable to be seen by [facility] doctor in the next 2 weeks, patient to follow up with me for BP med adjustment.

## 2013-12-10 ENCOUNTER — Inpatient Hospital Stay (HOSPITAL_COMMUNITY): Payer: Medicaid Other

## 2013-12-10 ENCOUNTER — Encounter (HOSPITAL_COMMUNITY): Payer: Self-pay | Admitting: *Deleted

## 2013-12-10 ENCOUNTER — Inpatient Hospital Stay (HOSPITAL_COMMUNITY)
Admission: AD | Admit: 2013-12-10 | Discharge: 2013-12-10 | Disposition: A | Discharge status: Home / Self Care | Payer: Medicaid Other | Source: Ambulatory Visit | Attending: Obstetrics and Gynecology | Admitting: Obstetrics and Gynecology

## 2013-12-10 DIAGNOSIS — J029 Acute pharyngitis, unspecified: Secondary | ICD-10-CM | POA: Insufficient documentation

## 2013-12-10 DIAGNOSIS — J069 Acute upper respiratory infection, unspecified: Secondary | ICD-10-CM | POA: Insufficient documentation

## 2013-12-10 DIAGNOSIS — O9989 Other specified diseases and conditions complicating pregnancy, childbirth and the puerperium: Principal | ICD-10-CM

## 2013-12-10 DIAGNOSIS — R509 Fever, unspecified: Secondary | ICD-10-CM | POA: Insufficient documentation

## 2013-12-10 DIAGNOSIS — R109 Unspecified abdominal pain: Secondary | ICD-10-CM | POA: Insufficient documentation

## 2013-12-10 DIAGNOSIS — O26899 Other specified pregnancy related conditions, unspecified trimester: Secondary | ICD-10-CM

## 2013-12-10 DIAGNOSIS — O99891 Other specified diseases and conditions complicating pregnancy: Secondary | ICD-10-CM | POA: Insufficient documentation

## 2013-12-10 DIAGNOSIS — Z3490 Encounter for supervision of normal pregnancy, unspecified, unspecified trimester: Secondary | ICD-10-CM

## 2013-12-10 DIAGNOSIS — Z87891 Personal history of nicotine dependence: Secondary | ICD-10-CM | POA: Insufficient documentation

## 2013-12-10 LAB — CBC
HCT: 36.7 % (ref 36.0–46.0)
Hemoglobin: 12.4 g/dL (ref 12.0–15.0)
MCH: 27.6 pg (ref 26.0–34.0)
MCHC: 33.8 g/dL (ref 30.0–36.0)
MCV: 81.7 fL (ref 78.0–100.0)
Platelets: 227 10*3/uL (ref 150–400)
RBC: 4.49 MIL/uL (ref 3.87–5.11)
RDW: 15.1 % (ref 11.5–15.5)
WBC: 12 10*3/uL — ABNORMAL HIGH (ref 4.0–10.5)

## 2013-12-10 LAB — URINALYSIS, ROUTINE W REFLEX MICROSCOPIC
Bilirubin Urine: NEGATIVE
Glucose, UA: NEGATIVE mg/dL
Hgb urine dipstick: NEGATIVE
Ketones, ur: NEGATIVE mg/dL
Leukocytes, UA: NEGATIVE
Nitrite: NEGATIVE
Protein, ur: NEGATIVE mg/dL
Specific Gravity, Urine: >1.03 — ABNORMAL HIGH (ref 1.005–1.030)
Urobilinogen, UA: 0.2 mg/dL (ref 0.0–1.0)
pH: 6 (ref 5.0–8.0)

## 2013-12-10 LAB — HCG, QUANTITATIVE, PREGNANCY: hCG, Beta Chain, Quant, S: 43114 m[IU]/mL — ABNORMAL HIGH (ref <5)

## 2013-12-10 LAB — INFLUENZA PANEL BY PCR (TYPE A & B)
H1N1 flu by pcr: NOT DETECTED
Influenza A By PCR: NEGATIVE
Influenza B By PCR: NEGATIVE

## 2013-12-10 NOTE — Discharge Instructions (Signed)
Upper Respiratory Infection, Adult An upper respiratory infection (URI) is also sometimes known as the common cold. The upper respiratory tract includes the nose, sinuses, throat, trachea, and bronchi. Bronchi are the airways leading to the lungs. Most people improve within 1 week, but symptoms can last up to 2 weeks. A residual cough may last even longer.  CAUSES Many different viruses can infect the tissues lining the upper respiratory tract. The tissues become irritated and inflamed and often become very moist. Mucus production is also common. A cold is contagious. You can easily spread the virus to others by oral contact. This includes kissing, sharing a glass, coughing, or sneezing. Touching your mouth or nose and then touching a surface, which is then touched by another person, can also spread the virus. SYMPTOMS  Symptoms typically develop 1 to 3 days after you come in contact with a cold virus. Symptoms vary from person to person. They may include:  Runny nose.  Sneezing.  Nasal congestion.  Sinus irritation.  Sore throat.  Loss of voice (laryngitis).  Cough.  Fatigue.  Muscle aches.  Loss of appetite.  Headache.  Low-grade fever. DIAGNOSIS  You might diagnose your own cold based on familiar symptoms, since most people get a cold 2 to 3 times a year. Your caregiver can confirm this based on your exam. Most importantly, your caregiver can check that your symptoms are not due to another disease such as strep throat, sinusitis, pneumonia, asthma, or epiglottitis. Blood tests, throat tests, and X-rays are not necessary to diagnose a common cold, but they may sometimes be helpful in excluding other more serious diseases. Your caregiver will decide if any further tests are required. RISKS AND COMPLICATIONS  You may be at risk for a more severe case of the common cold if you smoke cigarettes, have chronic heart disease (such as heart failure) or lung disease (such as asthma), or if  you have a weakened immune system. The very young and very old are also at risk for more serious infections. Bacterial sinusitis, middle ear infections, and bacterial pneumonia can complicate the common cold. The common cold can worsen asthma and chronic obstructive pulmonary disease (COPD). Sometimes, these complications can require emergency medical care and may be life-threatening. PREVENTION  The best way to protect against getting a cold is to practice good hygiene. Avoid oral or hand contact with people with cold symptoms. Wash your hands often if contact occurs. There is no clear evidence that vitamin C, vitamin E, echinacea, or exercise reduces the chance of developing a cold. However, it is always recommended to get plenty of rest and practice good nutrition. TREATMENT  Treatment is directed at relieving symptoms. There is no cure. Antibiotics are not effective, because the infection is caused by a virus, not by bacteria. Treatment may include:  Increased fluid intake. Sports drinks offer valuable electrolytes, sugars, and fluids.  Breathing heated mist or steam (vaporizer or shower).  Eating chicken soup or other clear broths, and maintaining good nutrition.  Getting plenty of rest.  Using gargles or lozenges for comfort.  Controlling fevers with ibuprofen or acetaminophen as directed by your caregiver.  Increasing usage of your inhaler if you have asthma. Zinc gel and zinc lozenges, taken in the first 24 hours of the common cold, can shorten the duration and lessen the severity of symptoms. Pain medicines may help with fever, muscle aches, and throat pain. A variety of non-prescription medicines are available to treat congestion and runny nose. Your caregiver  can make recommendations and may suggest nasal or lung inhalers for other symptoms.  HOME CARE INSTRUCTIONS   Only take over-the-counter or prescription medicines for pain, discomfort, or fever as directed by your  caregiver.  Use a warm mist humidifier or inhale steam from a shower to increase air moisture. This may keep secretions moist and make it easier to breathe.  Drink enough water and fluids to keep your urine clear or pale yellow.  Rest as needed.  Return to work when your temperature has returned to normal or as your caregiver advises. You may need to stay home longer to avoid infecting others. You can also use a face mask and careful hand washing to prevent spread of the virus. SEEK MEDICAL CARE IF:   After the first few days, you feel you are getting worse rather than better.  You need your caregiver's advice about medicines to control symptoms.  You develop chills, worsening shortness of breath, or brown or red sputum. These may be signs of pneumonia.  You develop yellow or brown nasal discharge or pain in the face, especially when you bend forward. These may be signs of sinusitis.  You develop a fever, swollen neck glands, pain with swallowing, or white areas in the back of your throat. These may be signs of strep throat. SEEK IMMEDIATE MEDICAL CARE IF:   You have a fever.  You develop severe or persistent headache, ear pain, sinus pain, or chest pain.  You develop wheezing, a prolonged cough, cough up blood, or have a change in your usual mucus (if you have chronic lung disease).  You develop sore muscles or a stiff neck. Document Released: 04/13/2001 Document Revised: 01/10/2012 Document Reviewed: 02/19/2011 [facility] Patient Information 2014 [place], [place]. Abdominal Pain During Pregnancy Abdominal pain is common in pregnancy. Most of the time, it does not cause harm. There are many causes of abdominal pain. Some causes are more serious than others. Some of the causes of abdominal pain in pregnancy are easily diagnosed. Occasionally, the diagnosis takes time to understand. Other times, the cause is not determined. Abdominal pain can be a sign that something is very wrong with  the pregnancy, or the pain may have nothing to do with the pregnancy at all. For this reason, always tell your health care provider if you have any abdominal discomfort. HOME CARE INSTRUCTIONS  Monitor your abdominal pain for any changes. The following actions may help to alleviate any discomfort you are experiencing:  Do not have sexual intercourse or put anything in your vagina until your symptoms go away completely.  Get plenty of rest until your pain improves.  Drink clear fluids if you feel nauseous. Avoid solid food as long as you are uncomfortable or nauseous.  Only take over-the-counter or prescription medicine as directed by your health care provider.  Keep all follow-up appointments with your health care provider. SEEK IMMEDIATE MEDICAL CARE IF:  You are bleeding, leaking fluid, or passing tissue from the vagina.  You have increasing pain or cramping.  You have persistent vomiting.  You have painful or bloody urination.  You have a fever.  You notice a decrease in your baby's movements.  You have extreme weakness or feel faint.  You have shortness of breath, with or without abdominal pain.  You develop a severe headache with abdominal pain.  You have abnormal vaginal discharge with abdominal pain.  You have persistent diarrhea.  You have abdominal pain that continues even after rest, or gets worse. MAKE  SURE YOU:   Understand these instructions.  Will watch your condition.  Will get help right away if you are not doing well or get worse. Document Released: 10/18/2005 Document Revised: 08/08/2013 Document Reviewed: 05/17/2013 [facility] Patient Information 2014 [place], [place].

## 2013-12-10 NOTE — MAU Note (Addendum)
Started over weekend, started with daughter, fever and sore throat.  Child's fever 102.7; pt's was 99. Productive cough- brown and bloody.  Reports pain on rt side comes and goes.  Face was 'swollen this morning, sinus pain/pressure- congestion

## 2013-12-10 NOTE — MAU Provider Note (Signed)
Attestation of Attending Supervision of Advanced Practitioner (CNM/NP): Evaluation and management procedures were performed by the Advanced Practitioner under my supervision and collaboration.  I have reviewed the Advanced Practitioner's note and chart, and I agree with the management and plan.  Ferris Tally 12/10/2013 6:03 PM

## 2013-12-10 NOTE — MAU Note (Signed)
Unable to hear FH with doppler, pt very uncomfortable with doppler attempt

## 2013-12-10 NOTE — MAU Provider Note (Signed)
Chief Complaint: Sore Throat, Fever and Abdominal Pain   First Provider Initiated Contact with Patient 12/10/13 0900     SUBJECTIVE HPI: [name] is a 33 y.o. [id number] at [redacted]w[redacted]d by LMP who presents to maternity admissions reporting sore throat, fever/chills, and tenderness on right mid abdomen and right lower back. She reports her 76 year old son had a fever and sore throat over the weekend and her symptoms started yesterday.  She has had a low-grade temperature at home, at 99.5. She has not yet had her first prenatal visit, scheduled with Dr Philis Pique.  She denies vaginal bleeding, vaginal itching/burning, urinary symptoms, h/a, dizziness, or n/v.   Past Medical History  Diagnosis Date  . Psoriasis   . Hypertension   . Abnormal Pap smear     patient states she had "cervical cancer" at 33 yo for which she had removal of atypical cells.   . Ovarian cyst rupture   . Complication of anesthesia   . Vaginal Pap smear, abnormal    Past Surgical History  Procedure Laterality Date  . Cholecystectomy  2009  . Cesarean section  2009, 2011  . Cryotherapy    . Back surgery     History   Social History  . Marital Status: Single    Spouse Name: N/A    Number of Children: N/A  . Years of Education: N/A   Occupational History  . Not on file.   Social History Main Topics  . Smoking status: Former Smoker -- 0.50 packs/day    Types: Cigarettes  . Smokeless tobacco: Never Used     Comment: with preg  . Alcohol Use: No  . Drug Use: No  . Sexual Activity: Yes    Birth Control/ Protection: None   Other Topics Concern  . Not on file   Social History Narrative  . No narrative on file   No current facility-administered medications on file prior to encounter.   Current Outpatient Prescriptions on File Prior to Encounter  Medication Sig Dispense Refill  . labetalol (NORMODYNE) 100 MG tablet Take 1 tablet (100 mg total) by mouth 2 (two) times daily.  60 tablet  1  . Prenatal Vit-Fe  Fumarate-FA (GNP PRENATAL VITAMINS) 28-0.8 MG TABS Take 1 tablet by mouth daily.  90 tablet  4   Allergies  Allergen Reactions  . Penicillins Anaphylaxis    unknown    ROS: Pertinent items in HPI  OBJECTIVE Blood pressure 114/72, pulse 80, temperature 98.2 F (36.8 C), temperature source Oral, resp. rate 20, height 5\' 6"  (1.676 m), weight 86.637 kg (191 lb), last menstrual period 10/01/2013, SpO2 100.00%. GENERAL: Well-developed, well-nourished female in no acute distress.  HEENT: Normocephalic HEART: normal rate RESP: normal effort ABDOMEN: Soft, mild tenderness on right mid/lower abdomen, no guarding, no rebound tenderness BACK: Negative CVA tenderness, generalized tenderness to lower back  EXTREMITIES: Nontender, no edema NEURO: Alert and oriented SPECULUM EXAM: NEFG, physiologic discharge, no blood noted, cervix clean   LAB RESULTS Results for orders placed during the hospital encounter of 12/10/13 (from the past 24 hour(s))  URINALYSIS, ROUTINE W REFLEX MICROSCOPIC     Status: Abnormal   Collection Time    12/10/13  8:05 AM      Result Value Range   Color, Urine YELLOW  YELLOW   APPearance CLEAR  CLEAR   Specific Gravity, Urine >1.030 (*) 1.005 - 1.030   pH 6.0  5.0 - 8.0   Glucose, UA NEGATIVE  NEGATIVE mg/dL  Hgb urine dipstick NEGATIVE  NEGATIVE   Bilirubin Urine NEGATIVE  NEGATIVE   Ketones, ur NEGATIVE  NEGATIVE mg/dL   Protein, ur NEGATIVE  NEGATIVE mg/dL   Urobilinogen, UA 0.2  0.0 - 1.0 mg/dL   Nitrite NEGATIVE  NEGATIVE   Leukocytes, UA NEGATIVE  NEGATIVE  CBC     Status: Abnormal   Collection Time    12/10/13  9:45 AM      Result Value Range   WBC 12.0 (*) 4.0 - 10.5 K/uL   RBC 4.49  3.87 - 5.11 MIL/uL   Hemoglobin 12.4  12.0 - 15.0 g/dL   HCT 36.7  36.0 - 46.0 %   MCV 81.7  78.0 - 100.0 fL   MCH 27.6  26.0 - 34.0 pg   MCHC 33.8  30.0 - 36.0 g/dL   RDW 15.1  11.5 - 15.5 %   Platelets 227  150 - 400 K/uL  HCG, QUANTITATIVE, PREGNANCY     Status:  Abnormal   Collection Time    12/10/13  9:45 AM      Result Value Range   hCG, Beta Chain, Quant, S 43114 (*) <5 mIU/mL    IMAGING US Ob Comp Less 14 Wks  12/10/2013   CLINICAL DATA:  Abdominal tenderness  EXAM: OBSTETRIC <14 WK [place] AND TRANSVAGINAL OB  US DOPPLER ULTRASOUND OF OVARIES  TECHNIQUE: Both transabdominal and transvaginal ultrasound examinations were performed for complete evaluation of the gestation as well as the maternal uterus, adnexal regions, and pelvic cul-de-sac. Transvaginal technique was performed to assess early pregnancy.  Color and duplex Doppler ultrasound was utilized to evaluate blood flow to the ovaries.  COMPARISON:  None.  FINDINGS: Yolk sac:  Normal.  Embryo:  Present.  Cardiac Activity: Present.  Heart Rate:  175 bpm  CRL:   2.23  mm   9 w 0d                  [place] EDC: 07/15/2014  Maternal uterus/adnexae: Bilateral ovaries are normal in size and echogenicity. The right ovary measures 4.7 x 6.1 x 3.6 cm. The left ovary measures 1.9 x 2.2 x 2.6 cm. There is a small hypoechoic right ovarian mass with peripheral Doppler flow most consistent with a corpus luteum cyst of pregnancy.  Pulsed Doppler evaluation of both ovaries demonstrates normal appearing low-resistance arterial and venous waveforms.  IMPRESSION: 1. Single live intrauterine pregnancy dating 9 weeks 0 days with an ultrasound EDC of 07/15/2014. 2. No active ovarian torsion.   Electronically Signed   By: Kathreen Devoid   On: 12/10/2013 10:52   [place] Art/ven Flow Abd Pelv Doppler  12/10/2013   CLINICAL DATA:  Abdominal tenderness  EXAM: OBSTETRIC <14 WK [place] AND TRANSVAGINAL OB  US DOPPLER ULTRASOUND OF OVARIES  TECHNIQUE: Both transabdominal and transvaginal ultrasound examinations were performed for complete evaluation of the gestation as well as the maternal uterus, adnexal regions, and pelvic cul-de-sac. Transvaginal technique was performed to assess early pregnancy.  Color and duplex Doppler ultrasound was utilized to  evaluate blood flow to the ovaries.  COMPARISON:  None.  FINDINGS: Yolk sac:  Normal.  Embryo:  Present.  Cardiac Activity: Present.  Heart Rate:  175 bpm  CRL:   2.23  mm   9 w 0d                  [place] EDC: 07/15/2014  Maternal uterus/adnexae: Bilateral ovaries are normal in size and echogenicity. The right ovary measures  4.7 x 6.1 x 3.6 cm. The left ovary measures 1.9 x 2.2 x 2.6 cm. There is a small hypoechoic right ovarian mass with peripheral Doppler flow most consistent with a corpus luteum cyst of pregnancy.  Pulsed Doppler evaluation of both ovaries demonstrates normal appearing low-resistance arterial and venous waveforms.  IMPRESSION: 1. Single live intrauterine pregnancy dating 9 weeks 0 days with an ultrasound EDC of 07/15/2014. 2. No active ovarian torsion.   Electronically Signed   By: Kathreen Devoid   On: 12/10/2013 10:52    ASSESSMENT 1. URI, acute   2. Abdominal pain complicating pregnancy   3. Normal IUP (intrauterine pregnancy) on prenatal ultrasound     PLAN Discharge home Influenza panel pending--will call today with results. Low suspicion for influenza/no Tamiflu based on no temperature >99.5. Robitussin OTC Tylenol for body aches Pt given list of safe medications in pregnancy Drink more water Rest/ice/heat/warm bath/Tylenol for pain    Medication List         GNP PRENATAL VITAMINS 28-0.8 MG Tabs  Take 1 tablet by mouth daily.     labetalol 100 MG tablet  Commonly known as:  NORMODYNE  Take 1 tablet (100 mg total) by mouth 2 (two) times daily.       Follow-up Information   Follow up with HORVATH,MICHELLE A, MD. (As scheduled.  Return to MAU as needed.)    Specialty:  Obstetrics and Gynecology   Contact information:   [address] [place] [address] [place] Certified Nurse-Midwife 12/10/2013  11:36 AM

## 2013-12-11 ENCOUNTER — Telehealth (HOSPITAL_COMMUNITY): Payer: Self-pay

## 2013-12-11 NOTE — Telephone Encounter (Signed)
Called patient back about negaive flu culture on 12/11/13

## 2014-01-07 LAB — OB RESULTS CONSOLE RUBELLA ANTIBODY, IGM: Rubella: IMMUNE

## 2014-01-07 LAB — OB RESULTS CONSOLE RPR: RPR: NONREACTIVE

## 2014-01-07 LAB — OB RESULTS CONSOLE HEPATITIS B SURFACE ANTIGEN: Hepatitis B Surface Ag: NEGATIVE

## 2014-01-07 LAB — OB RESULTS CONSOLE HIV ANTIBODY (ROUTINE TESTING): HIV: NONREACTIVE

## 2014-01-07 LAB — OB RESULTS CONSOLE ABO/RH: RH Type: POSITIVE

## 2014-01-07 LAB — OB RESULTS CONSOLE GC/CHLAMYDIA
Chlamydia: NEGATIVE
Gonorrhea: NEGATIVE

## 2014-03-17 ENCOUNTER — Inpatient Hospital Stay (HOSPITAL_COMMUNITY)
Admission: AD | Admit: 2014-03-17 | Discharge: 2014-03-18 | Disposition: A | Discharge status: Home / Self Care | Payer: Medicaid Other | Source: Ambulatory Visit | Attending: Obstetrics and Gynecology | Admitting: Obstetrics and Gynecology

## 2014-03-17 ENCOUNTER — Encounter (HOSPITAL_COMMUNITY): Payer: Self-pay | Admitting: *Deleted

## 2014-03-17 DIAGNOSIS — R102 Pelvic and perineal pain: Secondary | ICD-10-CM

## 2014-03-17 DIAGNOSIS — O9989 Other specified diseases and conditions complicating pregnancy, childbirth and the puerperium: Principal | ICD-10-CM

## 2014-03-17 DIAGNOSIS — F17200 Nicotine dependence, unspecified, uncomplicated: Secondary | ICD-10-CM | POA: Insufficient documentation

## 2014-03-17 DIAGNOSIS — R109 Unspecified abdominal pain: Secondary | ICD-10-CM | POA: Insufficient documentation

## 2014-03-17 DIAGNOSIS — I10 Essential (primary) hypertension: Secondary | ICD-10-CM | POA: Insufficient documentation

## 2014-03-17 DIAGNOSIS — N898 Other specified noninflammatory disorders of vagina: Secondary | ICD-10-CM

## 2014-03-17 DIAGNOSIS — O99891 Other specified diseases and conditions complicating pregnancy: Secondary | ICD-10-CM | POA: Insufficient documentation

## 2014-03-17 DIAGNOSIS — O26899 Other specified pregnancy related conditions, unspecified trimester: Secondary | ICD-10-CM

## 2014-03-17 DIAGNOSIS — O26892 Other specified pregnancy related conditions, second trimester: Secondary | ICD-10-CM

## 2014-03-17 LAB — URINALYSIS, ROUTINE W REFLEX MICROSCOPIC
Bilirubin Urine: NEGATIVE
Glucose, UA: NEGATIVE mg/dL
Hgb urine dipstick: NEGATIVE
Ketones, ur: NEGATIVE mg/dL
Leukocytes, UA: NEGATIVE
Nitrite: NEGATIVE
Protein, ur: NEGATIVE mg/dL
Specific Gravity, Urine: >1.03 — ABNORMAL HIGH (ref 1.005–1.030)
Urobilinogen, UA: 0.2 mg/dL (ref 0.0–1.0)
pH: 6 (ref 5.0–8.0)

## 2014-03-17 LAB — POCT FERN TEST: POCT Fern Test: NEGATIVE

## 2014-03-17 NOTE — MAU Note (Signed)
Pt G8 P2 at 22.6wks, having abd pain and pressure, tonight had a gush of clear fluid around 2245.

## 2014-03-18 DIAGNOSIS — O9989 Other specified diseases and conditions complicating pregnancy, childbirth and the puerperium: Secondary | ICD-10-CM

## 2014-03-18 DIAGNOSIS — N898 Other specified noninflammatory disorders of vagina: Secondary | ICD-10-CM

## 2014-03-18 LAB — WET PREP, GENITAL
Clue Cells Wet Prep HPF POC: NONE SEEN
Trich, Wet Prep: NONE SEEN
Yeast Wet Prep HPF POC: NONE SEEN

## 2014-03-18 LAB — GC/CHLAMYDIA PROBE AMP
CT Probe RNA: NEGATIVE
GC Probe RNA: NEGATIVE

## 2014-03-18 NOTE — Discharge Instructions (Signed)
Second Trimester of Pregnancy The second trimester is from week 13 through week 28, months 4 through 6. The second trimester is often a time when you feel your best. Your body has also adjusted to being pregnant, and you begin to feel better physically. Usually, morning sickness has lessened or quit completely, you may have more energy, and you may have an increase in appetite. The second trimester is also a time when the fetus is growing rapidly. At the end of the sixth month, the fetus is about 9 inches long and weighs about 1 pounds. You will likely begin to feel the baby move (quickening) between 18 and 20 weeks of the pregnancy. BODY CHANGES Your body goes through many changes during pregnancy. The changes vary from woman to woman.   Your weight will continue to increase. You will notice your lower abdomen bulging out.  You may begin to get stretch marks on your hips, abdomen, and breasts.  You may develop headaches that can be relieved by medicines approved by your caregiver.  You may urinate more often because the fetus is pressing on your bladder.  You may develop or continue to have heartburn as a result of your pregnancy.  You may develop constipation because certain hormones are causing the muscles that push waste through your intestines to slow down.  You may develop hemorrhoids or swollen, bulging veins (varicose veins).  You may have back pain because of the weight gain and pregnancy hormones relaxing your joints between the bones in your pelvis and as a result of a shift in weight and the muscles that support your balance.  Your breasts will continue to grow and be tender.  Your gums may bleed and may be sensitive to brushing and flossing.  Dark spots or blotches (chloasma, mask of pregnancy) may develop on your face. This will likely fade after the baby is born.  A dark line from your belly button to the pubic area (linea nigra) may appear. This will likely fade after the  baby is born. WHAT TO EXPECT AT YOUR PRENATAL VISITS During a routine prenatal visit:  You will be weighed to make sure you and the fetus are growing normally.  Your blood pressure will be taken.  Your abdomen will be measured to track your baby's growth.  The fetal heartbeat will be listened to.  Any test results from the previous visit will be discussed. Your caregiver may ask you:  How you are feeling.  If you are feeling the baby move.  If you have had any abnormal symptoms, such as leaking fluid, bleeding, severe headaches, or abdominal cramping.  If you have any questions. Other tests that may be performed during your second trimester include:  Blood tests that check for:  Low iron levels (anemia).  Gestational diabetes (between 24 and 28 weeks).  Rh antibodies.  Urine tests to check for infections, diabetes, or protein in the urine.  An ultrasound to confirm the proper growth and development of the baby.  An amniocentesis to check for possible genetic problems.  Fetal screens for spina bifida and Down syndrome. HOME CARE INSTRUCTIONS   Avoid all smoking, herbs, alcohol, and unprescribed drugs. These chemicals affect the formation and growth of the baby.  Follow your caregiver's instructions regarding medicine use. There are medicines that are either safe or unsafe to take during pregnancy.  Exercise only as directed by your caregiver. Experiencing uterine cramps is a good sign to stop exercising.  Continue to eat regular,   healthy meals.  Wear a good support bra for breast tenderness.  Do not use hot tubs, steam rooms, or saunas.  Wear your seat belt at all times when driving.  Avoid raw meat, uncooked cheese, cat litter boxes, and soil used by cats. These carry germs that can cause birth defects in the baby.  Take your prenatal vitamins.  Try taking a stool softener (if your caregiver approves) if you develop constipation. Eat more high-fiber foods,  such as fresh vegetables or fruit and whole grains. Drink plenty of fluids to keep your urine clear or pale yellow.  Take warm sitz baths to soothe any pain or discomfort caused by hemorrhoids. Use hemorrhoid cream if your caregiver approves.  If you develop varicose veins, wear support hose. Elevate your feet for 15 minutes, 3 4 times a day. Limit salt in your diet.  Avoid heavy lifting, wear low heel shoes, and practice good posture.  Rest with your legs elevated if you have leg cramps or low back pain.  Visit your dentist if you have not gone yet during your pregnancy. Use a soft toothbrush to brush your teeth and be gentle when you floss.  A sexual relationship may be continued unless your caregiver directs you otherwise.  Continue to go to all your prenatal visits as directed by your caregiver. SEEK MEDICAL CARE IF:   You have dizziness.  You have mild pelvic cramps, pelvic pressure, or nagging pain in the abdominal area.  You have persistent nausea, vomiting, or diarrhea.  You have a bad smelling vaginal discharge.  You have pain with urination. SEEK IMMEDIATE MEDICAL CARE IF:   You have a fever.  You are leaking fluid from your vagina.  You have spotting or bleeding from your vagina.  You have severe abdominal cramping or pain.  You have rapid weight gain or loss.  You have shortness of breath with chest pain.  You notice sudden or extreme swelling of your face, hands, ankles, feet, or legs.  You have not felt your baby move in over an hour.  You have severe headaches that do not go away with medicine.  You have vision changes. Document Released: 10/12/2001 Document Revised: 06/20/2013 Document Reviewed: 12/19/2012 [employer] Patient Information 2014 [employer].  

## 2014-03-18 NOTE — MAU Provider Note (Signed)
Chief Complaint:  Vaginal Discharge and Abdominal Pain   First Provider Initiated Contact with Patient 03/18/14 0052     HPI: [name] is a 33 y.o. [id number] at [redacted]w[redacted]d who presents to maternity admissions reporting gush of clear fluid at 10:30 PM that wet her underpants and slightly wet her pants. Small amount of leaking since then, mostly just when she wipes. States fluid looked clear on her underpants. Reports abdominal pain and pressure to RN, but states that it has been going on throughout the entire pregnancy. Pressure is constant. Denies vaginal bleeding. Good fetal movement.   Denies history of preterm labor or incompetent cervix.  Past Medical History: Past Medical History  Diagnosis Date  . Psoriasis   . Hypertension   . Abnormal Pap smear     patient states she had "cervical cancer" at 33 yo for which she had removal of atypical cells.   . Ovarian cyst rupture   . Complication of anesthesia   . Vaginal Pap smear, abnormal     Past obstetric history: OB History  Gravida Para Term Preterm AB SAB TAB Ectopic Multiple Living  8 2 2  5 5    2     # Outcome Date GA Lbr Len/2nd Weight Sex Delivery Anes PTL Lv  8 CUR           7 SAB           6 SAB           5 SAB           4 SAB           3 SAB           2 TRM     F LTCS  N Y     Comments: scheduled repeat  1 TRM     F LTCS  N Y     Comments: oligo      Past Surgical History: Past Surgical History  Procedure Laterality Date  . Cholecystectomy  2009  . Cesarean section  2009, 2011  . Cryotherapy    . Back surgery       Family History: Family History  Problem Relation Age of Onset  . Drug abuse Mother   . Heart disease Maternal Grandmother   . Heart disease Maternal Grandfather   . Hearing loss Neg Hx     Social History: History  Substance Use Topics  . Smoking status: Current Some Day Smoker -- 0.50 packs/day for 20 years    Types: Cigarettes  . Smokeless tobacco: Never Used     Comment: with preg   . Alcohol Use: No    Allergies:  Allergies  Allergen Reactions  . Penicillins Anaphylaxis    unknown    Meds:  Prescriptions prior to admission  Medication Sig Dispense Refill  . labetalol (NORMODYNE) 100 MG tablet Take 1 tablet (100 mg total) by mouth 2 (two) times daily.  60 tablet  1  . Prenatal Vit-Fe Fumarate-FA (GNP PRENATAL VITAMINS) 28-0.8 MG TABS Take 1 tablet by mouth daily.  90 tablet  4    ROS: Pertinent findings in history of present illness.  Physical Exam  Blood pressure 129/76, pulse 100, temperature 98.4 F (36.9 C), temperature source Oral, resp. rate 18, height 5\' 7"  (1.702 m), weight 88.179 kg (194 lb 6.4 oz), last menstrual period 10/01/2013. GENERAL: Well-developed, well-nourished female in no acute distress.  HEENT: normocephalic HEART: normal rate RESP: normal effort ABDOMEN: Soft, non-tender,  gravid appropriate for gestational age EXTREMITIES: Nontender, no edema NEURO: alert and oriented SPECULUM EXAM: NEFG, small amount of creamy, odorless discharge, negative pooling. No leaking on Valsalva. no blood, cervix clean.  Dilation: Closed Effacement (%): Thick Cervical Position: Posterior Exam by:: Marlou Porch CNM  FHT:  153 by Doppler Contractions: None   Labs: Results for orders placed during the hospital encounter of 03/17/14 (from the past 24 hour(s))  URINALYSIS, ROUTINE W REFLEX MICROSCOPIC     Status: Abnormal   Collection Time    03/17/14 11:30 PM      Result Value Ref Range   Color, Urine YELLOW  YELLOW   APPearance CLEAR  CLEAR   Specific Gravity, Urine >1.030 (*) 1.005 - 1.030   pH 6.0  5.0 - 8.0   Glucose, UA NEGATIVE  NEGATIVE mg/dL   Hgb urine dipstick NEGATIVE  NEGATIVE   Bilirubin Urine NEGATIVE  NEGATIVE   Ketones, ur NEGATIVE  NEGATIVE mg/dL   Protein, ur NEGATIVE  NEGATIVE mg/dL   Urobilinogen, UA 0.2  0.0 - 1.0 mg/dL   Nitrite NEGATIVE  NEGATIVE   Leukocytes, UA NEGATIVE  NEGATIVE  POCT FERN TEST     Status: None    Collection Time    03/17/14 11:49 PM      Result Value Ref Range   POCT Fern Test Negative = intact amniotic membranes    WET PREP, GENITAL     Status: Abnormal   Collection Time    03/18/14  1:00 AM      Result Value Ref Range   Yeast Wet Prep HPF POC NONE SEEN  NONE SEEN   Trich, Wet Prep NONE SEEN  NONE SEEN   Clue Cells Wet Prep HPF POC NONE SEEN  NONE SEEN   WBC, Wet Prep HPF POC FEW (*) NONE SEEN  negative fern x2.  Imaging:  No results found.  MAU Course:   Assessment: 1. Vaginal discharge in pregnancy in second trimester   2. Pain of round ligament complicating pregnancy, antepartum     Plan: Discharge home in stable condition per consult with Dr. Rogue Bussing. Preterm labor precautions and fetal kick counts. Recommend maternity support belt. Discussed normal expectations for discomforts in pregnancy and how they may vary as parity  increases.     Follow-up Information   Follow up with [facility]. (As scheduled or  As needed If symptoms worsen)    Contact information:   [address] [place] [address] [phone]      Follow up with [facility]. (As needed in emergencies)    Contact information:   [address] [id number] [place] [address] [phone]       Medication List         GNP PRENATAL VITAMINS 28-0.8 MG Tabs  Take 1 tablet by mouth daily.     labetalol 100 MG tablet  Commonly known as:  NORMODYNE  Take 1 tablet (100 mg total) by mouth 2 (two) times daily.        [place], CNM 03/18/2014 1:44 AM

## 2014-04-24 ENCOUNTER — Encounter (HOSPITAL_COMMUNITY): Payer: Self-pay | Admitting: *Deleted

## 2014-04-24 ENCOUNTER — Inpatient Hospital Stay (HOSPITAL_COMMUNITY)
Admission: AD | Admit: 2014-04-24 | Discharge: 2014-04-24 | Disposition: A | Discharge status: Home / Self Care | Payer: Medicaid Other | Source: Ambulatory Visit | Attending: Obstetrics and Gynecology | Admitting: Obstetrics and Gynecology

## 2014-04-24 DIAGNOSIS — M7989 Other specified soft tissue disorders: Secondary | ICD-10-CM

## 2014-04-24 DIAGNOSIS — O2200 Varicose veins of lower extremity in pregnancy, unspecified trimester: Secondary | ICD-10-CM | POA: Insufficient documentation

## 2014-04-24 DIAGNOSIS — IMO0002 Reserved for concepts with insufficient information to code with codable children: Secondary | ICD-10-CM | POA: Insufficient documentation

## 2014-04-24 DIAGNOSIS — I83893 Varicose veins of bilateral lower extremities with other complications: Secondary | ICD-10-CM

## 2014-04-24 NOTE — MAU Note (Signed)
Vascular sonographer at bedside for doppler.

## 2014-04-24 NOTE — MAU Note (Signed)
Cindy with Vascular lab called to inform of Venous Duplex order. States order was received and sonographer from [facility] will come to perform exam. Patient informed.

## 2014-04-24 NOTE — MAU Note (Signed)
Pt has varicose in R leg, pt states entire R leg is swollen, tender for last 2 days.   Send from MD office.  Denies uc's, LOF, or bleeding.

## 2014-04-24 NOTE — MAU Provider Note (Signed)
History     CSN: [id number]  Arrival date and time: 04/24/14 1150   First Provider Initiated Contact with Patient 04/24/14 1227      Chief Complaint  Patient presents with  . Leg Swelling   HPI [name] is a 33 y.o. [id number] at [redacted]w[redacted]d who presents to MAU today from the office with complaint of left leg swelling. She states chronic varicose veins that have become worse and painful x 2 days. She states pain is only in the right leg. She denies redness or bruising of the area. She denies SOB. She denies any history of bleeding/clotting disorders or blood clots.   OB History   Grav Para Term Preterm Abortions TAB SAB Ect Mult Living   8 2 2  5  5   2       Past Medical History  Diagnosis Date  . Psoriasis   . Hypertension   . Abnormal Pap smear     patient states she had "cervical cancer" at 33 yo for which she had removal of atypical cells.   . Ovarian cyst rupture   . Complication of anesthesia   . Vaginal Pap smear, abnormal     Past Surgical History  Procedure Laterality Date  . Cholecystectomy  2009  . Cesarean section  2009, 2011  . Cryotherapy    . Back surgery      Family History  Problem Relation Age of Onset  . Drug abuse Mother   . Heart disease Maternal Grandmother   . Heart disease Maternal Grandfather   . Hearing loss Neg Hx     History  Substance Use Topics  . Smoking status: Current Some Day Smoker -- 0.50 packs/day for 20 years    Types: Cigarettes  . Smokeless tobacco: Never Used     Comment: with preg  . Alcohol Use: No    Allergies:  Allergies  Allergen Reactions  . Penicillins Anaphylaxis    unknown    Prescriptions prior to admission  Medication Sig Dispense Refill  . labetalol (NORMODYNE) 100 MG tablet Take 1 tablet (100 mg total) by mouth 2 (two) times daily.  60 tablet  1  . Prenatal Vit-Fe Fumarate-FA (GNP PRENATAL VITAMINS) 28-0.8 MG TABS Take 1 tablet by mouth daily.  90 tablet  4    Review of Systems   Constitutional: Negative for fever and malaise/fatigue.  Respiratory: Negative for shortness of breath.   Cardiovascular: Positive for leg swelling.   Physical Exam   Blood pressure 119/69, pulse 82, temperature 97.9 F (36.6 C), temperature source Oral, resp. rate 18, height 5\' 6"  (1.676 m), weight 202 lb 6.4 oz (91.808 kg), last menstrual period 10/01/2013, SpO2 98.00%.  Physical Exam  Constitutional: She is oriented to person, place, and time. She appears well-developed and well-nourished. No distress.  HENT:  Head: Normocephalic and atraumatic.  Cardiovascular: Normal rate.   Respiratory: Effort normal.  Musculoskeletal: She exhibits edema (mild bilateral edema slightly more prominent on the left) and tenderness (mild).  Prominent varicose vein running from the mid upper leg over the knee into the calf. Mild tenderness to palpation of the varicosity. No surrounding erythema. Negative homan's sign  Neurological: She is alert and oriented to person, place, and time.  Skin: Skin is warm and dry. No erythema.  Psychiatric: She has a normal mood and affect.    Fetal Monitoring: Baseline: 130 bpm, moderate variability, + accelerations, no decelerations Contractions: none  MAU Course  Procedures None  MDM Left  leg venous dopplers - negative The patient states that she was given Rx for compression stockings, but cannot afford the ones recommended.  Discussed with Dr. Harrington Challenger. [place] for discharge. The office will be in touch with the patient with a plan for compression stockings.   Assessment and Plan  A: Varicose veins  P: Discharge home Warning signs for DVT discussed Patient advised to follow-up in the office as scheduled Patient may return to MAU as needed or if her condition were to change or worsen   Farris Has, PA-C  04/24/2014, 12:27 PM

## 2014-04-24 NOTE — Progress Notes (Signed)
*  PRELIMINARY RESULTS* Vascular Ultrasound Right lower extremity venous duplex has been completed.  Preliminary findings: no evidence of deep or superficial thrombosis.   Landry Mellow, RDMS, RVT  04/24/2014, 2:06 PM

## 2014-04-24 NOTE — Discharge Instructions (Signed)
Compression Stockings Compression stockings are elastic stockings that "compress" your legs. This helps to increase blood flow, decrease swelling, and reduces the chance of getting blood clots in your lower legs. Compression stockings are used:  After surgery.  If you have a history of poor circulation.  If you are prone to blood clots.  If you have varicose veins.  If you sit or are bedridden for long periods of time. WEARING COMPRESSION STOCKINGS  Your compression stockings should be worn as instructed by your caregiver.  Wearing the correct stocking size is important. Your caregiver can help measure and fit you to the correct size.  When wearing your stockings, do not allow the stockings to bunch up. This is especially important around your toes or behind your knees. Keep the stockings as smooth as possible.  Do not roll the stockings downward and leave them rolled down. This can form a restrictive band around your legs and can decrease blood flow.  The stockings should be removed once a day for 1 hour or as instructed by your caregiver. When the stockings are taken off, inspect your legs and feet. Look for:  Open sores.  Red spots.  Puffy areas (swelling).  Anything that does not seem normal. IMPORTANT INFORMATION ABOUT COMPRESSION STOCKINGS  The compression stockings should be clean, dry, and in good condition before you put them on.  Do not put lotion on your legs or feet. This makes it harder to put the stockings on.  Change your stockings immediately if they become wet or soiled.  Do not wear stockings that are ripped or torn.  You may hand-wash or put your stockings in the washing machine. Use cold or warm water with mild detergent. Do not bleach your stockings. They may be air-dried or dried in the dryer on low heat.  If you have pain or have a feeling of "pins and needles" in your feet or legs, you may be wearing stockings that are too tight. Call your caregiver  right away. SEEK IMMEDIATE MEDICAL CARE IF:   You have numbness or tingling in your lower legs that does not get better quickly after the stockings are removed.  Your toes or feet become cold and blue.  You develop open sores or have red spots on your legs that do not go away. MAKE SURE YOU:   Understand these instructions.  Will watch your condition.  Will get help right away if you are not doing well or get worse. Document Released: 08/15/2009 Document Revised: 01/10/2012 Document Reviewed: 08/15/2009 ExitCare Patient Information 2015 [employer]. This information is not intended to replace advice given to you by your health care provider. Make sure you discuss any questions you have with your health care provider.  

## 2014-05-10 ENCOUNTER — Encounter (HOSPITAL_COMMUNITY): Payer: Self-pay | Admitting: *Deleted

## 2014-05-10 ENCOUNTER — Inpatient Hospital Stay (HOSPITAL_COMMUNITY)
Admission: AD | Admit: 2014-05-10 | Discharge: 2014-05-10 | Disposition: A | Discharge status: Home / Self Care | Payer: Medicaid Other | Source: Ambulatory Visit | Attending: Obstetrics & Gynecology | Admitting: Obstetrics & Gynecology

## 2014-05-10 DIAGNOSIS — O10019 Pre-existing essential hypertension complicating pregnancy, unspecified trimester: Secondary | ICD-10-CM | POA: Diagnosis not present

## 2014-05-10 DIAGNOSIS — M545 Low back pain, unspecified: Secondary | ICD-10-CM

## 2014-05-10 DIAGNOSIS — O99330 Smoking (tobacco) complicating pregnancy, unspecified trimester: Secondary | ICD-10-CM | POA: Diagnosis not present

## 2014-05-10 DIAGNOSIS — O26899 Other specified pregnancy related conditions, unspecified trimester: Secondary | ICD-10-CM

## 2014-05-10 DIAGNOSIS — Z3689 Encounter for other specified antenatal screening: Secondary | ICD-10-CM

## 2014-05-10 DIAGNOSIS — O99891 Other specified diseases and conditions complicating pregnancy: Secondary | ICD-10-CM | POA: Insufficient documentation

## 2014-05-10 DIAGNOSIS — O26893 Other specified pregnancy related conditions, third trimester: Secondary | ICD-10-CM

## 2014-05-10 DIAGNOSIS — O9989 Other specified diseases and conditions complicating pregnancy, childbirth and the puerperium: Secondary | ICD-10-CM

## 2014-05-10 LAB — URINALYSIS, ROUTINE W REFLEX MICROSCOPIC
Bilirubin Urine: NEGATIVE
Glucose, UA: NEGATIVE mg/dL
Hgb urine dipstick: NEGATIVE
Ketones, ur: 15 mg/dL — AB
Leukocytes, UA: NEGATIVE
Nitrite: NEGATIVE
Protein, ur: NEGATIVE mg/dL
Specific Gravity, Urine: 1.025 (ref 1.005–1.030)
Urobilinogen, UA: 0.2 mg/dL (ref 0.0–1.0)
pH: 6 (ref 5.0–8.0)

## 2014-05-10 NOTE — MAU Note (Signed)
Contractions in back, getting closer and stronger.  Denies hx of ptl or ptd.

## 2014-05-10 NOTE — Discharge Instructions (Signed)
Third Trimester of Pregnancy The third trimester is from week 29 through week 42, months 7 through 9. This trimester is when your unborn baby (fetus) is growing very fast. At the end of the ninth month, the unborn baby is about 20 inches in length. It weighs about 6-10 pounds.  HOME CARE   Avoid all smoking, herbs, and alcohol. Avoid drugs not approved by your doctor.  Only take medicine as told by your doctor. Some medicines are safe and some are not during pregnancy.  Exercise only as told by your doctor. Stop exercising if you start having cramps.  Eat regular, healthy meals.  Wear a good support bra if your breasts are tender.  Do not use hot tubs, steam rooms, or saunas.  Wear your seat belt when driving.  Avoid raw meat, uncooked cheese, and liter boxes and soil used by cats.  Take your prenatal vitamins.  Try taking medicine that helps you poop (stool softener) as needed, and if your doctor approves. Eat more fiber by eating fresh fruit, vegetables, and whole grains. Drink enough fluids to keep your pee (urine) clear or pale yellow.  Take warm water baths (sitz baths) to soothe pain or discomfort caused by hemorrhoids. Use hemorrhoid cream if your doctor approves.  If you have puffy, bulging veins (varicose veins), wear support hose. Raise (elevate) your feet for 15 minutes, 3-4 times a day. Limit salt in your diet.  Avoid heavy lifting, wear low heels, and sit up straight.  Rest with your legs raised if you have leg cramps or low back pain.  Visit your dentist if you have not gone during your pregnancy. Use a soft toothbrush to brush your teeth. Be gentle when you floss.  You can have sex (intercourse) unless your doctor tells you not to.  Do not travel far distances unless you must. Only do so with your doctor's approval.  Take prenatal classes.  Practice driving to the hospital.  Pack your hospital bag.  Prepare the baby's room.  Go to your doctor visits. GET  HELP IF:  You are not sure if you are in labor or if your water has broken.  You are dizzy.  You have mild cramps or pressure in your lower belly (abdominal).  You have a nagging pain in your belly area.  You continue to feel sick to your stomach (nauseous), throw up (vomit), or have watery poop (diarrhea).  You have bad smelling fluid coming from your vagina.  You have pain with peeing (urination). GET HELP RIGHT AWAY IF:   You have a fever.  You are leaking fluid from your vagina.  You are spotting or bleeding from your vagina.  You have severe belly cramping or pain.  You lose or gain weight rapidly.  You have trouble catching your breath and have chest pain.  You notice sudden or extreme puffiness (swelling) of your face, hands, ankles, feet, or legs.  You have not felt the baby move in over an hour.  You have severe headaches that do not go away with medicine.  You have vision changes. Document Released: 01/12/2010 Document Revised: 02/12/2013 Document Reviewed: 12/19/2012 Exit[employer] Patient Information 2015 [employer]. This information is not intended to replace advice given to you by your health care provider. Make sure you discuss any questions you have with your health care provider.  

## 2014-05-10 NOTE — MAU Provider Note (Signed)
Reviewed case with NP and I agree with above  [name] STACIA

## 2014-05-10 NOTE — MAU Provider Note (Signed)
History     CSN: [id number]  Arrival date and time: 05/10/14 1522   First Provider Initiated Contact with Patient 05/10/14 1707      Chief Complaint  Patient presents with  . Contractions   HPI [name] is 33 y.o. [id number] [redacted]w[redacted]d weeks presenting with lower back pain thought to be contractions.  Began at 2pm.  Rates worse pain 6/10 and at this time 5/10.   Called the office and instructed to come to MAU.  Patient of Dr. Tyler Andrade.  Last seen 2 weeks--has appt 7/15.  Denies vaginal bleeding.  Having a lot of discharge.  "Lots of " Fetal movement which causes more discomfort.     Past Medical History  Diagnosis Date  . Psoriasis   . Hypertension   . Abnormal Pap smear     patient states she had "cervical cancer" at 33 yo for which she had removal of atypical cells.   . Ovarian cyst rupture   . Complication of anesthesia   . Vaginal Pap smear, abnormal     Past Surgical History  Procedure Laterality Date  . Cholecystectomy  2009  . Cesarean section  2009, 2011  . Cryotherapy    . Back surgery      Family History  Problem Relation Age of Onset  . Drug abuse Mother   . Heart disease Maternal Grandmother   . Heart disease Maternal Grandfather   . Hearing loss Neg Hx     History  Substance Use Topics  . Smoking status: Current Some Day Smoker -- 0.50 packs/day for 20 years    Types: Cigarettes  . Smokeless tobacco: Never Used     Comment: with preg  . Alcohol Use: No    Allergies:  Allergies  Allergen Reactions  . Penicillins Anaphylaxis    unknown    Prescriptions prior to admission  Medication Sig Dispense Refill  . calcium carbonate (TUMS - DOSED IN MG ELEMENTAL CALCIUM) 500 MG chewable tablet Chew 2 tablets by mouth daily as needed for indigestion or heartburn.      . Prenatal Vit-Fe Fumarate-FA (PRENATAL MULTIVITAMIN) TABS tablet Take 1 tablet by mouth daily at 12 noon.        Review of Systems  Gastrointestinal: Negative for abdominal pain (neg for  cramping).  Genitourinary: Negative for dysuria, urgency, frequency and hematuria.       Neg for vaginal bleeding or LOF  Musculoskeletal: Positive for back pain (low back ache).   Physical Exam   Blood pressure 133/72, pulse 92, temperature 98.3 F (36.8 C), temperature source Oral, resp. rate 18, height 5\' 5"  (1.651 m), weight 202 lb (91.627 kg), last menstrual period 10/01/2013.  Physical Exam  Constitutional: She is oriented to person, place, and time. She appears well-developed and well-nourished. No distress.  HENT:  Head: Normocephalic.  Neck: Normal range of motion.  Cardiovascular: Normal rate.   Respiratory: Effort normal.  Genitourinary:  deferred  Musculoskeletal:  NEG FOR FLANK PAIN  Neurological: She is alert and oriented to person, place, and time.  Psychiatric: She has a normal mood and affect. Her behavior is normal. Thought content normal.    Results for orders placed during the hospital encounter of 05/10/14 (from the past 24 hour(s))  URINALYSIS, ROUTINE W REFLEX MICROSCOPIC     Status: Abnormal   Collection Time    05/10/14  3:40 PM      Result Value Ref Range   Color, Urine YELLOW  YELLOW   APPearance CLEAR  CLEAR   Specific Gravity, Urine 1.025  1.005 - 1.030   pH 6.0  5.0 - 8.0   Glucose, UA NEGATIVE  NEGATIVE mg/dL   Hgb urine dipstick NEGATIVE  NEGATIVE   Bilirubin Urine NEGATIVE  NEGATIVE   Ketones, ur 15 (*) NEGATIVE mg/dL   Protein, ur NEGATIVE  NEGATIVE mg/dL   Urobilinogen, UA 0.2  0.0 - 1.0 mg/dL   Nitrite NEGATIVE  NEGATIVE   Leukocytes, UA NEGATIVE  NEGATIVE   MAU Course  Procedures  MDM 17:10  Reported MSE, FHT to Dr. Alwyn Andrade.  With no contractions and reactive NST, pelvic exam no indicated.  Order to tell patient to stay well hydrated and take Tylenol for back ache.  Keep scheduled appointment for 7/15 and call for worsening sxs  Patient given crackers and Sprite in MAU  Assessment and Plan  A:  Low back ache in third trimester  pregnancy       Reactive NST, Neg for contractions  P:  Keep scheduled appt in office       Report worsening sxs, LOF, decreased fetal movement, vaginal bleeding       Tylenol for discomfort       Stay well hydrated  [name],EVE M 05/10/2014, 5:10 PM

## 2014-07-05 ENCOUNTER — Encounter (HOSPITAL_COMMUNITY): Payer: Self-pay | Admitting: Pharmacist

## 2014-07-09 ENCOUNTER — Encounter (HOSPITAL_COMMUNITY): Payer: Self-pay

## 2014-07-09 ENCOUNTER — Other Ambulatory Visit: Payer: Self-pay | Admitting: Obstetrics and Gynecology

## 2014-07-09 ENCOUNTER — Encounter (HOSPITAL_COMMUNITY)
Admission: RE | Admit: 2014-07-09 | Discharge: 2014-07-09 | Disposition: A | Discharge status: Home / Self Care | Payer: Medicaid Other | Source: Ambulatory Visit | Attending: Obstetrics and Gynecology | Admitting: Obstetrics and Gynecology

## 2014-07-09 LAB — RPR

## 2014-07-09 LAB — CBC
HCT: 30.8 % — ABNORMAL LOW (ref 36.0–46.0)
Hemoglobin: 10.3 g/dL — ABNORMAL LOW (ref 12.0–15.0)
MCH: 28.1 pg (ref 26.0–34.0)
MCHC: 33.4 g/dL (ref 30.0–36.0)
MCV: 83.9 fL (ref 78.0–100.0)
Platelets: 256 10*3/uL (ref 150–400)
RBC: 3.67 MIL/uL — ABNORMAL LOW (ref 3.87–5.11)
RDW: 15.8 % — ABNORMAL HIGH (ref 11.5–15.5)
WBC: 11 10*3/uL — ABNORMAL HIGH (ref 4.0–10.5)

## 2014-07-09 LAB — TYPE AND SCREEN
ABO/RH(D): B POS
Antibody Screen: NEGATIVE

## 2014-07-09 NOTE — Patient Instructions (Signed)
Your procedure is scheduled on:07/10/13  Enter through the Main Entrance at :0930 am Pick up desk phone and dial [phone] and inform us of your arrival.  Please call [phone] if you have any problems the morning of surgery.  Remember: Do not eat food or drink liquids, including water, after midnight:tonight    You may brush your teeth the morning of surgery.  Take these meds the morning of surgery with a sip of water:  DO NOT wear jewelry, eye make-up, lipstick,body lotion, or dark fingernail polish.  (Polished toes are ok) You may wear deodorant.  If you are to be admitted after surgery, leave suitcase in car until your room has been assigned. Patients discharged on the day of surgery will not be allowed to drive home. Wear loose fitting, comfortable clothes for your ride home.

## 2014-07-10 ENCOUNTER — Inpatient Hospital Stay (HOSPITAL_COMMUNITY): Payer: Medicaid Other | Admitting: Anesthesiology

## 2014-07-10 ENCOUNTER — Encounter (HOSPITAL_COMMUNITY)
Admission: RE | Disposition: A | Discharge status: Home / Self Care | Payer: Self-pay | Source: Ambulatory Visit | Attending: Obstetrics and Gynecology

## 2014-07-10 ENCOUNTER — Encounter (HOSPITAL_COMMUNITY): Payer: Self-pay | Admitting: General Practice

## 2014-07-10 ENCOUNTER — Inpatient Hospital Stay (HOSPITAL_COMMUNITY)
Admission: RE | Admit: 2014-07-10 | Discharge: 2014-07-12 | DRG: 765 | Disposition: A | Discharge status: Home / Self Care | Payer: Medicaid Other | Source: Ambulatory Visit | Attending: Obstetrics and Gynecology | Admitting: Obstetrics and Gynecology

## 2014-07-10 ENCOUNTER — Encounter (HOSPITAL_COMMUNITY): Payer: Medicaid Other | Admitting: Anesthesiology

## 2014-07-10 DIAGNOSIS — O99334 Smoking (tobacco) complicating childbirth: Secondary | ICD-10-CM | POA: Diagnosis present

## 2014-07-10 DIAGNOSIS — O34219 Maternal care for unspecified type scar from previous cesarean delivery: Principal | ICD-10-CM | POA: Diagnosis present

## 2014-07-10 DIAGNOSIS — O1002 Pre-existing essential hypertension complicating childbirth: Secondary | ICD-10-CM | POA: Diagnosis present

## 2014-07-10 DIAGNOSIS — Z9889 Other specified postprocedural states: Secondary | ICD-10-CM

## 2014-07-10 DIAGNOSIS — Z8541 Personal history of malignant neoplasm of cervix uteri: Secondary | ICD-10-CM

## 2014-07-10 DIAGNOSIS — Z302 Encounter for sterilization: Secondary | ICD-10-CM

## 2014-07-10 DIAGNOSIS — E669 Obesity, unspecified: Secondary | ICD-10-CM

## 2014-07-10 DIAGNOSIS — Z8673 Personal history of transient ischemic attack (TIA), and cerebral infarction without residual deficits: Secondary | ICD-10-CM | POA: Diagnosis not present

## 2014-07-10 DIAGNOSIS — O09529 Supervision of elderly multigravida, unspecified trimester: Secondary | ICD-10-CM | POA: Diagnosis present

## 2014-07-10 LAB — CBC
HCT: 30.6 % — ABNORMAL LOW (ref 36.0–46.0)
Hemoglobin: 9.9 g/dL — ABNORMAL LOW (ref 12.0–15.0)
MCH: 27.3 pg (ref 26.0–34.0)
MCHC: 32.4 g/dL (ref 30.0–36.0)
MCV: 84.5 fL (ref 78.0–100.0)
Platelets: 257 10*3/uL (ref 150–400)
RBC: 3.62 MIL/uL — ABNORMAL LOW (ref 3.87–5.11)
RDW: 15.9 % — ABNORMAL HIGH (ref 11.5–15.5)
WBC: 10.4 10*3/uL (ref 4.0–10.5)

## 2014-07-10 SURGERY — Surgical Case
Anesthesia: Spinal

## 2014-07-10 MED ORDER — SENNOSIDES-DOCUSATE SODIUM 8.6-50 MG PO TABS
2.0000 | ORAL_TABLET | ORAL | Status: DC
  Administered 2014-07-10 – 2014-07-11 (×2): 2 via ORAL
  Filled 2014-07-10 (×2): qty 2

## 2014-07-10 MED ORDER — METOCLOPRAMIDE HCL 5 MG/ML IJ SOLN: 10.0000 mg | Freq: Three times a day (TID) | INTRAMUSCULAR | Status: DC | PRN

## 2014-07-10 MED ORDER — NALOXONE HCL 1 MG/ML IJ SOLN
1.0000 ug/kg/h | INTRAVENOUS | Status: DC | PRN
  Filled 2014-07-10: qty 2

## 2014-07-10 MED ORDER — LACTATED RINGERS IV SOLN
INTRAVENOUS | Status: DC
  Administered 2014-07-10 (×3): via INTRAVENOUS

## 2014-07-10 MED ORDER — FENTANYL CITRATE 0.05 MG/ML IJ SOLN: 25.0000 ug | INTRAMUSCULAR | Status: DC | PRN

## 2014-07-10 MED ORDER — NALBUPHINE HCL 10 MG/ML IJ SOLN
5.0000 mg | INTRAMUSCULAR | Status: DC | PRN
  Administered 2014-07-10: 10 mg via SUBCUTANEOUS

## 2014-07-10 MED ORDER — WITCH HAZEL-GLYCERIN EX PADS: 1.0000 "application " | MEDICATED_PAD | CUTANEOUS | Status: DC | PRN

## 2014-07-10 MED ORDER — BISACODYL 10 MG RE SUPP: 10.0000 mg | Freq: Every day | RECTAL | Status: DC | PRN

## 2014-07-10 MED ORDER — BUPIVACAINE IN DEXTROSE 0.75-8.25 % IT SOLN
INTRATHECAL | Status: DC | PRN
  Administered 2014-07-10: 12 mg via INTRATHECAL

## 2014-07-10 MED ORDER — PNEUMOCOCCAL VAC POLYVALENT 25 MCG/0.5ML IJ INJ
0.5000 mL | INJECTION | INTRAMUSCULAR | Status: AC
  Administered 2014-07-11: 0.5 mL via INTRAMUSCULAR
  Filled 2014-07-10: qty 0.5

## 2014-07-10 MED ORDER — KETOROLAC TROMETHAMINE 30 MG/ML IJ SOLN: 30.0000 mg | Freq: Four times a day (QID) | INTRAMUSCULAR | Status: AC | PRN

## 2014-07-10 MED ORDER — METHYLERGONOVINE MALEATE 0.2 MG/ML IJ SOLN: 0.2000 mg | INTRAMUSCULAR | Status: DC | PRN

## 2014-07-10 MED ORDER — SCOPOLAMINE 1 MG/3DAYS TD PT72
MEDICATED_PATCH | TRANSDERMAL | Status: AC
  Administered 2014-07-10: 1.5 mg via TRANSDERMAL
  Filled 2014-07-10: qty 1

## 2014-07-10 MED ORDER — PHENYLEPHRINE 8 MG IN D5W 100 ML (0.08MG/ML) PREMIX OPTIME
INJECTION | INTRAVENOUS | Status: DC | PRN
  Administered 2014-07-10: 60 ug/min via INTRAVENOUS

## 2014-07-10 MED ORDER — TETANUS-DIPHTH-ACELL PERTUSSIS 5-2.5-18.5 LF-MCG/0.5 IM SUSP
0.5000 mL | Freq: Once | INTRAMUSCULAR | Status: AC
  Administered 2014-07-11: 0.5 mL via INTRAMUSCULAR
  Filled 2014-07-10: qty 0.5

## 2014-07-10 MED ORDER — IBUPROFEN 100 MG/5ML PO SUSP
600.0000 mg | Freq: Four times a day (QID) | ORAL | Status: DC
  Administered 2014-07-10 – 2014-07-12 (×7): 600 mg via ORAL
  Filled 2014-07-10 (×11): qty 30

## 2014-07-10 MED ORDER — ONDANSETRON HCL 4 MG/2ML IJ SOLN
INTRAMUSCULAR | Status: DC | PRN
  Administered 2014-07-10: 4 mg via INTRAVENOUS

## 2014-07-10 MED ORDER — LANOLIN HYDROUS EX OINT: 1.0000 "application " | TOPICAL_OINTMENT | CUTANEOUS | Status: DC | PRN

## 2014-07-10 MED ORDER — OXYTOCIN 40 UNITS IN LACTATED RINGERS INFUSION - SIMPLE MED: 62.5000 mL/h | INTRAVENOUS | Status: AC

## 2014-07-10 MED ORDER — MEPERIDINE HCL 25 MG/ML IJ SOLN: 6.2500 mg | INTRAMUSCULAR | Status: DC | PRN

## 2014-07-10 MED ORDER — OXYCODONE HCL 5 MG/5ML PO SOLN
5.0000 mg | ORAL | Status: DC | PRN
  Administered 2014-07-10 – 2014-07-11 (×3): 5 mg via ORAL
  Filled 2014-07-10 (×5): qty 5

## 2014-07-10 MED ORDER — SIMETHICONE 80 MG PO CHEW
80.0000 mg | CHEWABLE_TABLET | ORAL | Status: DC
  Administered 2014-07-10 – 2014-07-11 (×2): 80 mg via ORAL
  Filled 2014-07-10 (×2): qty 1

## 2014-07-10 MED ORDER — DIBUCAINE 1 % RE OINT: 1.0000 "application " | TOPICAL_OINTMENT | RECTAL | Status: DC | PRN

## 2014-07-10 MED ORDER — COMPLETENATE 29-1 MG PO CHEW
1.0000 | CHEWABLE_TABLET | Freq: Every day | ORAL | Status: DC
  Administered 2014-07-11 – 2014-07-12 (×2): 1 via ORAL
  Filled 2014-07-10 (×4): qty 1

## 2014-07-10 MED ORDER — KETOROLAC TROMETHAMINE 30 MG/ML IJ SOLN
30.0000 mg | Freq: Four times a day (QID) | INTRAMUSCULAR | Status: AC | PRN
  Administered 2014-07-10: 30 mg via INTRAMUSCULAR

## 2014-07-10 MED ORDER — ACETAMINOPHEN 160 MG/5ML PO SOLN
650.0000 mg | ORAL | Status: DC | PRN
  Administered 2014-07-11 – 2014-07-12 (×4): 650 mg via ORAL
  Filled 2014-07-10 (×3): qty 20.3

## 2014-07-10 MED ORDER — FLEET ENEMA 7-19 GM/118ML RE ENEM: 1.0000 | ENEMA | Freq: Every day | RECTAL | Status: DC | PRN

## 2014-07-10 MED ORDER — OXYTOCIN 10 UNIT/ML IJ SOLN
INTRAMUSCULAR | Status: AC
  Filled 2014-07-10: qty 4

## 2014-07-10 MED ORDER — OXYCODONE-ACETAMINOPHEN 5-325 MG PO TABS: 1.0000 | ORAL_TABLET | ORAL | Status: DC | PRN

## 2014-07-10 MED ORDER — OXYCODONE-ACETAMINOPHEN 5-325 MG PO TABS: 2.0000 | ORAL_TABLET | ORAL | Status: DC | PRN

## 2014-07-10 MED ORDER — DIPHENHYDRAMINE HCL 25 MG PO CAPS
25.0000 mg | ORAL_CAPSULE | ORAL | Status: DC | PRN
  Administered 2014-07-11: 25 mg via ORAL

## 2014-07-10 MED ORDER — METHYLERGONOVINE MALEATE 0.2 MG PO TABS: 0.2000 mg | ORAL_TABLET | ORAL | Status: DC | PRN

## 2014-07-10 MED ORDER — FERROUS SULFATE 300 (60 FE) MG/5ML PO SYRP
300.0000 mg | ORAL_SOLUTION | Freq: Two times a day (BID) | ORAL | Status: DC
  Administered 2014-07-11 – 2014-07-12 (×3): 300 mg via ORAL
  Filled 2014-07-10 (×6): qty 5

## 2014-07-10 MED ORDER — MORPHINE SULFATE 0.5 MG/ML IJ SOLN
INTRAMUSCULAR | Status: AC
  Filled 2014-07-10: qty 10

## 2014-07-10 MED ORDER — DIPHENHYDRAMINE HCL 25 MG PO CAPS
25.0000 mg | ORAL_CAPSULE | Freq: Four times a day (QID) | ORAL | Status: DC | PRN
  Filled 2014-07-10: qty 1

## 2014-07-10 MED ORDER — GENTAMICIN SULFATE 40 MG/ML IJ SOLN
INTRAVENOUS | Status: AC
  Administered 2014-07-10: 118 mL via INTRAVENOUS
  Filled 2014-07-10: qty 12

## 2014-07-10 MED ORDER — SCOPOLAMINE 1 MG/3DAYS TD PT72
1.0000 | MEDICATED_PATCH | Freq: Once | TRANSDERMAL | Status: DC
  Filled 2014-07-10: qty 1

## 2014-07-10 MED ORDER — OXYCODONE HCL 5 MG/5ML PO SOLN
10.0000 mg | ORAL | Status: DC | PRN
  Administered 2014-07-11 – 2014-07-12 (×3): 10 mg via ORAL
  Filled 2014-07-10 (×2): qty 10

## 2014-07-10 MED ORDER — PHENYLEPHRINE HCL 10 MG/ML IJ SOLN
INTRAMUSCULAR | Status: AC
  Filled 2014-07-10: qty 1

## 2014-07-10 MED ORDER — IBUPROFEN 100 MG/5ML PO SUSP
600.0000 mg | Freq: Four times a day (QID) | ORAL | Status: DC | PRN
  Filled 2014-07-10: qty 30

## 2014-07-10 MED ORDER — ACETAMINOPHEN 160 MG/5ML PO SOLN
1000.0000 mg | Freq: Four times a day (QID) | ORAL | Status: AC
  Filled 2014-07-10: qty 40.6

## 2014-07-10 MED ORDER — FERROUS SULFATE 325 (65 FE) MG PO TABS: 325.0000 mg | ORAL_TABLET | Freq: Two times a day (BID) | ORAL | Status: DC

## 2014-07-10 MED ORDER — SIMETHICONE 80 MG PO CHEW
80.0000 mg | CHEWABLE_TABLET | Freq: Three times a day (TID) | ORAL | Status: DC
  Administered 2014-07-11 – 2014-07-12 (×4): 80 mg via ORAL
  Filled 2014-07-10 (×4): qty 1

## 2014-07-10 MED ORDER — ACETAMINOPHEN 500 MG PO TABS: 1000.0000 mg | ORAL_TABLET | Freq: Four times a day (QID) | ORAL | Status: DC

## 2014-07-10 MED ORDER — ONDANSETRON HCL 4 MG/2ML IJ SOLN: 4.0000 mg | Freq: Three times a day (TID) | INTRAMUSCULAR | Status: DC | PRN

## 2014-07-10 MED ORDER — ONDANSETRON HCL 4 MG/2ML IJ SOLN
INTRAMUSCULAR | Status: AC
  Filled 2014-07-10: qty 2

## 2014-07-10 MED ORDER — ACETAMINOPHEN 160 MG/5ML PO SOLN: 1000.0000 mg | Freq: Four times a day (QID) | ORAL | Status: AC

## 2014-07-10 MED ORDER — MEASLES, MUMPS & RUBELLA VAC ~~LOC~~ INJ
0.5000 mL | INJECTION | Freq: Once | SUBCUTANEOUS | Status: DC
  Filled 2014-07-10: qty 0.5

## 2014-07-10 MED ORDER — FENTANYL CITRATE 0.05 MG/ML IJ SOLN
INTRAMUSCULAR | Status: DC | PRN
  Administered 2014-07-10: 25 ug via INTRATHECAL

## 2014-07-10 MED ORDER — DIPHENHYDRAMINE HCL 50 MG/ML IJ SOLN: 25.0000 mg | INTRAMUSCULAR | Status: DC | PRN

## 2014-07-10 MED ORDER — MORPHINE SULFATE (PF) 0.5 MG/ML IJ SOLN
INTRAMUSCULAR | Status: DC | PRN
  Administered 2014-07-10: .15 mg via INTRATHECAL

## 2014-07-10 MED ORDER — DIPHENHYDRAMINE HCL 50 MG/ML IJ SOLN: 12.5000 mg | INTRAMUSCULAR | Status: DC | PRN

## 2014-07-10 MED ORDER — ZOLPIDEM TARTRATE 5 MG PO TABS: 5.0000 mg | ORAL_TABLET | Freq: Every evening | ORAL | Status: DC | PRN

## 2014-07-10 MED ORDER — NALBUPHINE HCL 10 MG/ML IJ SOLN
5.0000 mg | INTRAMUSCULAR | Status: DC | PRN
  Administered 2014-07-10 – 2014-07-11 (×2): 10 mg via INTRAVENOUS
  Filled 2014-07-10 (×2): qty 1

## 2014-07-10 MED ORDER — SIMETHICONE 80 MG PO CHEW: 80.0000 mg | CHEWABLE_TABLET | ORAL | Status: DC | PRN

## 2014-07-10 MED ORDER — NALBUPHINE HCL 10 MG/ML IJ SOLN
INTRAMUSCULAR | Status: AC
  Administered 2014-07-10: 10 mg via SUBCUTANEOUS
  Filled 2014-07-10: qty 1

## 2014-07-10 MED ORDER — KETOROLAC TROMETHAMINE 30 MG/ML IJ SOLN
INTRAMUSCULAR | Status: AC
  Administered 2014-07-10: 30 mg via INTRAMUSCULAR
  Filled 2014-07-10: qty 1

## 2014-07-10 MED ORDER — SODIUM CHLORIDE 0.9 % IJ SOLN: 3.0000 mL | INTRAMUSCULAR | Status: DC | PRN

## 2014-07-10 MED ORDER — MIDAZOLAM HCL 2 MG/2ML IJ SOLN: 0.5000 mg | Freq: Once | INTRAMUSCULAR | Status: DC | PRN

## 2014-07-10 MED ORDER — MENTHOL 3 MG MT LOZG: 1.0000 | LOZENGE | OROMUCOSAL | Status: DC | PRN

## 2014-07-10 MED ORDER — ONDANSETRON HCL 4 MG/2ML IJ SOLN: 4.0000 mg | INTRAMUSCULAR | Status: DC | PRN

## 2014-07-10 MED ORDER — NALOXONE HCL 0.4 MG/ML IJ SOLN: 0.4000 mg | INTRAMUSCULAR | Status: DC | PRN

## 2014-07-10 MED ORDER — OXYTOCIN 10 UNIT/ML IJ SOLN
40.0000 [IU] | INTRAVENOUS | Status: DC | PRN
  Administered 2014-07-10: 40 [IU] via INTRAVENOUS

## 2014-07-10 MED ORDER — LACTATED RINGERS IV SOLN
Freq: Once | INTRAVENOUS | Status: AC
  Administered 2014-07-10: 10:00:00 via INTRAVENOUS

## 2014-07-10 MED ORDER — FENTANYL CITRATE 0.05 MG/ML IJ SOLN
INTRAMUSCULAR | Status: AC
  Filled 2014-07-10: qty 2

## 2014-07-10 MED ORDER — IBUPROFEN 600 MG PO TABS: 600.0000 mg | ORAL_TABLET | Freq: Four times a day (QID) | ORAL | Status: DC | PRN

## 2014-07-10 MED ORDER — LACTATED RINGERS IV SOLN: INTRAVENOUS | Status: DC

## 2014-07-10 MED ORDER — SCOPOLAMINE 1 MG/3DAYS TD PT72
1.0000 | MEDICATED_PATCH | Freq: Once | TRANSDERMAL | Status: DC
  Administered 2014-07-10: 1.5 mg via TRANSDERMAL

## 2014-07-10 MED ORDER — ACETAMINOPHEN 160 MG/5ML PO SOLN
325.0000 mg | ORAL | Status: DC | PRN
  Administered 2014-07-10 – 2014-07-11 (×2): 325 mg via ORAL
  Filled 2014-07-10 (×3): qty 20.3

## 2014-07-10 MED ORDER — ONDANSETRON HCL 4 MG PO TABS: 4.0000 mg | ORAL_TABLET | ORAL | Status: DC | PRN

## 2014-07-10 MED ORDER — PRENATAL MULTIVITAMIN CH: 1.0000 | ORAL_TABLET | Freq: Every day | ORAL | Status: DC

## 2014-07-10 MED ORDER — PROMETHAZINE HCL 25 MG/ML IJ SOLN: 6.2500 mg | INTRAMUSCULAR | Status: DC | PRN

## 2014-07-10 MED ORDER — IBUPROFEN 600 MG PO TABS
600.0000 mg | ORAL_TABLET | Freq: Four times a day (QID) | ORAL | Status: DC
  Filled 2014-07-10: qty 1

## 2014-07-10 SURGICAL SUPPLY — 34 items
BENZOIN TINCTURE PRP APPL 2/3 (GAUZE/BANDAGES/DRESSINGS) ×2 IMPLANT
BLADE SURG 10 STRL SS (BLADE) ×4 IMPLANT
CLAMP CORD UMBIL (MISCELLANEOUS) IMPLANT
CLIP FILSHIE TUBAL LIGA STRL (Clip) ×2 IMPLANT
CLOTH BEACON ORANGE TIMEOUT ST (SAFETY) ×2 IMPLANT
DRAPE LG THREE QUARTER DISP (DRAPES) IMPLANT
DRSG OPSITE POSTOP 4X10 (GAUZE/BANDAGES/DRESSINGS) ×2 IMPLANT
DURAPREP 26ML APPLICATOR (WOUND CARE) ×2 IMPLANT
ELECT REM PT RETURN 9FT ADLT (ELECTROSURGICAL) ×2
ELECTRODE REM PT RTRN 9FT ADLT (ELECTROSURGICAL) ×1 IMPLANT
EXTRACTOR VACUUM BELL STYLE (SUCTIONS) IMPLANT
GLOVE BIO SURGEON STRL SZ7 (GLOVE) ×2 IMPLANT
GOWN STRL REUS W/TWL LRG LVL3 (GOWN DISPOSABLE) ×4 IMPLANT
KIT ABG SYR 3ML LUER SLIP (SYRINGE) IMPLANT
NEEDLE HYPO 25X5/8 SAFETYGLIDE (NEEDLE) IMPLANT
NS IRRIG 1000ML POUR BTL (IV SOLUTION) ×2 IMPLANT
PACK C SECTION WH (CUSTOM PROCEDURE TRAY) ×2 IMPLANT
PAD OB MATERNITY 4.3X12.25 (PERSONAL CARE ITEMS) ×2 IMPLANT
RETRACTOR WND ALEXIS 25 LRG (MISCELLANEOUS) ×1 IMPLANT
RTRCTR WOUND ALEXIS 25CM LRG (MISCELLANEOUS) ×2
STAPLER VISISTAT 35W (STAPLE) IMPLANT
STRIP CLOSURE SKIN 1/2X4 (GAUZE/BANDAGES/DRESSINGS) ×2 IMPLANT
SUT MNCRL 0 VIOLET CTX 36 (SUTURE) ×2 IMPLANT
SUT MONOCRYL 0 CTX 36 (SUTURE) ×2
SUT PDS AB 0 CTX 60 (SUTURE) IMPLANT
SUT PLAIN 2 0 XLH (SUTURE) IMPLANT
SUT VIC AB 0 CT1 27 (SUTURE) ×2
SUT VIC AB 0 CT1 27XBRD ANBCTR (SUTURE) ×2 IMPLANT
SUT VIC AB 2-0 CT1 27 (SUTURE) ×1
SUT VIC AB 2-0 CT1 TAPERPNT 27 (SUTURE) ×1 IMPLANT
SUT VIC AB 4-0 KS 27 (SUTURE) ×2 IMPLANT
TOWEL OR 17X24 6PK STRL BLUE (TOWEL DISPOSABLE) ×2 IMPLANT
TRAY FOLEY CATH 14FR (SET/KITS/TRAYS/PACK) ×2 IMPLANT
WATER STERILE IRR 1000ML POUR (IV SOLUTION) ×2 IMPLANT

## 2014-07-10 NOTE — Anesthesia Postprocedure Evaluation (Signed)
Anesthesia Post Note  Patient: [name]  Procedure(s) Performed: Procedure(s) (LRB): REPEAT CESAREAN SECTION (N/A)  Anesthesia type: Spinal  Patient location: PACU  Post pain: Pain level controlled  Post assessment: Post-op Vital signs reviewed  Last Vitals:  Filed Vitals:   07/10/14 1230  BP: 119/61  Pulse: 66  Temp:   Resp: 18    Post vital signs: Reviewed  Level of consciousness: awake  Complications: No apparent anesthesia complications

## 2014-07-10 NOTE — H&P (Signed)
33 y.o.  [redacted]w[redacted]d    [id number] comes in for a repeat cesarean section at term.  Patient has good fetal movement and no bleeding. Pt has no s/s preeclampsia- her CHTN has been normal without meds.  Antenatal testing has been all reassuring and EFW last 57%ile.  Past Medical History  Diagnosis Date  . Psoriasis   . Abnormal Pap smear     patient states she had "cervical cancer" at 33 yo for which she had removal of atypical cells.   . Ovarian cyst rupture   . Vaginal Pap smear, abnormal   . Hypertension     no meds currently  . Complication of anesthesia 2014    pt states she had "bad reaction"- "they thought I had a stroke"    Past Surgical History  Procedure Laterality Date  . Cholecystectomy  2009  . Cesarean section  2009, 2011  . Cryotherapy    . Back surgery      OB History  Gravida Para Term Preterm AB SAB TAB Ectopic Multiple Living  8 2 2  5 5    2     # Outcome Date GA Lbr Len/2nd Weight Sex Delivery Anes PTL Lv  8 CUR           7 TRM 08/18/10    F LTCS Spinal N Y     Comments: oligo  6 TRM 06/21/08    F LTCS Spinal N Y     Comments: scheduled repeat  5 SAB 2006          4 SAB 2005          3 SAB 2004          2 SAB 1998          1 SAB 1996              History   Social History  . Marital Status: Married    Spouse Name: N/A    Number of Children: N/A  . Years of Education: N/A   Occupational History  . Not on file.   Social History Main Topics  . Smoking status: Current Some Day Smoker -- 0.50 packs/day for 20 years    Types: Cigarettes  . Smokeless tobacco: Never Used     Comment: with preg  . Alcohol Use: No  . Drug Use: No  . Sexual Activity: Yes    Birth Control/ Protection: None     Comment: last sex one month ago   Other Topics Concern  . Not on file   Social History Narrative  . No narrative on file   Penicillins   Prenatal Course: Uncomplicated- see HPI.   Prenatal Transfer Tool  Maternal Diabetes: No Genetic Screening:  Normal Maternal Ultrasounds/Referrals: Normal Fetal Ultrasounds or other Referrals:  None Maternal Substance Abuse:  No Significant Maternal Medications:  None Significant Maternal Lab Results: None   There were no vitals filed for this visit.   Lungs/Cor:  NAD Abdomen:  soft, gravid Ex:  no cords, erythema SVE:  NA FHTs:  present  A/P   For repeat cesarean sectionat term with BTL.  Pt sis sure she desires permanent sterilization.  All risks, benefits and alternatives discussed with patient and she desires to proceed.  Eben Choinski A

## 2014-07-10 NOTE — Anesthesia Preprocedure Evaluation (Signed)
Anesthesia Evaluation  Patient identified by MRN, date of birth, ID band Patient awake    Reviewed: Allergy & Precautions, H&P , NPO status , Patient's Chart, lab work & pertinent test results  History of Anesthesia Complications (+) PROLONGED EMERGENCE and history of anesthetic complications  Airway Mallampati: II      Dental   Pulmonary Current Smoker,  breath sounds clear to auscultation        Cardiovascular Exercise Tolerance: Good hypertension, Rhythm:regular Rate:Normal     Neuro/Psych    GI/Hepatic   Endo/Other    Renal/GU      Musculoskeletal   Abdominal   Peds  Hematology   Anesthesia Other Findings   Reproductive/Obstetrics (+) Pregnancy                           Anesthesia Physical Anesthesia Plan  ASA: III  Anesthesia Plan: Spinal   Post-op Pain Management:    Induction:   Airway Management Planned:   Additional Equipment:   Intra-op Plan:   Post-operative Plan:   Informed Consent: I have reviewed the patients History and Physical, chart, labs and discussed the procedure including the risks, benefits and alternatives for the proposed anesthesia with the patient or authorized representative who has indicated his/her understanding and acceptance.     Plan Discussed with: Anesthesiologist, CRNA and Surgeon  Anesthesia Plan Comments:         Anesthesia Quick Evaluation

## 2014-07-10 NOTE — Op Note (Signed)
07/10/2014  11:47 AM  PATIENT:  [name]  33 y.o. female  PRE-OPERATIVE DIAGNOSIS:  PREVIOUS C/S, desires sterilization  POST-OPERATIVE DIAGNOSIS:  PREVIOUS C/S, same  PROCEDURE:  Procedure(s): REPEAT CESAREAN SECTION (N/A)  SURGEON:  Surgeon(s) and Role:    * Daria Pastures, MD - Primary    ASSISTANTS: Dr. Melinda Crutch   ANESTHESIA:   spinal  EBL:  Total I/O In: 2500 [I.V.:2500] Out: 950 [Urine:150; Blood:800]  SPECIMEN:  No Specimen  DISPOSITION OF SPECIMEN:  N/A  COUNTS:  YES  TOURNIQUET:  * No tourniquets in log *  DICTATION: .Note written in EPIC  PLAN OF CARE: Admit to inpatient   PATIENT DISPOSITION:  PACU - hemodynamically stable.   Delay start of Pharmacological VTE agent (>24hrs) due to surgical blood loss or risk of bleeding: not applicable  Complications:  none Medications:  Ancef, Pitocin Findings:  Baby female, Apgars 8,8, weight P.   Normal tubes, ovaries and uterus seen.  Baby was skin to skin with mother after birth in the OR.  Technique:  After adequate spinal anesthesia was achieved, the patient was prepped and draped in usual sterile fashion.  A foley catheter was used to drain the bladder.  A pfannanstiel incision was made with the scalpel and carried down to the fascia with the bovie cautery. The fascia was incised in the midline with the scalpel and carried in a transverse curvilinear manner bilaterally.  The fascia was reflected superiorly and inferiorly off the rectus muscles and the muscles split in the midline.  A bowel free portion of the peritoneum was entered bluntly and then extended in a superior and inferior manner with good visualization of the bowel and bladder.  The Alexis instrument was then placed and the vesico-uterine fascia tented up and incised in a transverse curvilinear manner.  A 2 cm transverse incision was made in the upper portion of the lower uterine segment until the amnion was exposed.   The incision was extended  transversely in a blunt manner.  Clear fluid was noted and the baby delivered in the vertex presentation without complication.  The baby was bulb suctioned and the cord was clamped and cut.  The baby was then handed to awaiting Neonatology.  The placenta was then delivered manually and the uterus cleared of all debris.  The uterine incision was then closed with a running lock stitch of 0 monocryl.  An imbricating layer of 0 monocryl was closed as well. Excellent hemostasis of the uterine incision was achieved and the abdomen was cleared with irrigation.  The peritoneum was closed with a running stitch of 2-0 vicryl.  This incorporated the rectus muscles as a separate layer.  The fascia was then closed with a running stitch of 0 vicryl.  The subcutaneous layer was closed with interrupted  stitches of 2-0 plain gut.  The skin was closed with 4-0 vicryl on a Keith needle and steri-strips.  The patient tolerated the procedure well and was returned to the recovery room in stable condition.  All counts were correct times three.  Corin Tilly A

## 2014-07-10 NOTE — Lactation Note (Signed)
This note was copied from the chart of [place]. Lactation Consultation Note Initial visit at 8 hours of age.  Mom is hold baby STS asleep on her chest.  Mom reports baby prefers left breast, but is trying to offer both sides.  [facility] LC resources given and discussed.  Encouraged to feed with early cues on demand.  Early newborn behavior discussed.  Hand expression demonstrated by mom with colostrum visible.  Mom to call for assist as needed.    Patient Name: [name] [id number] Date: 07/10/2014 Reason for consult: Initial assessment   Maternal Data Has patient been taught Hand Expression?: Yes Does the patient have breastfeeding experience prior to this delivery?: Yes  Feeding Feeding Type: Breast Fed Length of feed: 7 min  LATCH Score/Interventions                      Lactation Tools Discussed/Used WIC Program: Yes   Consult Status Consult Status: Follow-up Date: 07/11/14 Follow-up type: In-patient    [name], Justine Null 07/10/2014, 7:40 PM

## 2014-07-10 NOTE — Progress Notes (Signed)
There has been no change in the patients history, status or exam since the history and physical.  Filed Vitals:   07/10/14 0914  BP: 142/78  Pulse: 73  Temp: 98.4 F (36.9 C)  TempSrc: Oral  Resp: 16  SpO2: 99%    Lab Results  Component Value Date   WBC 10.4 07/10/2014   HGB 9.9* 07/10/2014   HCT 30.6* 07/10/2014   MCV 84.5 07/10/2014   PLT 257 07/10/2014    Nil Xiong A

## 2014-07-10 NOTE — Brief Op Note (Addendum)
07/10/2014  11:47 AM  PATIENT:  [name]  33 y.o. female  PRE-OPERATIVE DIAGNOSIS:  PREVIOUS C/S, desires sterilization  POST-OPERATIVE DIAGNOSIS:  PREVIOUS C/S, same  PROCEDURE:  Procedure(s): REPEAT CESAREAN SECTION (N/A)  SURGEON:  Surgeon(s) and Role:    * Daria Pastures, MD - Primary    ASSISTANTS: Dr. Melinda Crutch   ANESTHESIA:   spinal  EBL:  Total I/O In: 2500 [I.V.:2500] Out: 950 [Urine:150; Blood:800]  SPECIMEN:  No Specimen  DISPOSITION OF SPECIMEN:  N/A  COUNTS:  YES  TOURNIQUET:  * No tourniquets in log *  DICTATION: .Note written in EPIC  PLAN OF CARE: Admit to inpatient   PATIENT DISPOSITION:  PACU - hemodynamically stable.   Delay start of Pharmacological VTE agent (>24hrs) due to surgical blood loss or risk of bleeding: not applicable  Complications:  none Medications:  Ancef, Pitocin Findings:  Baby female, Apgars 8,8, weight P.   Normal tubes, ovaries and uterus seen.  Baby was skin to skin with mother after birth in the OR.  Technique:  After adequate spinal anesthesia was achieved, the patient was prepped and draped in usual sterile fashion.  A foley catheter was used to drain the bladder.  A pfannanstiel incision was made with the scalpel and carried down to the fascia with the bovie cautery. The fascia was incised in the midline with the scalpel and carried in a transverse curvilinear manner bilaterally.  The fascia was reflected superiorly and inferiorly off the rectus muscles and the muscles split in the midline.  A bowel free portion of the peritoneum was entered bluntly and then extended in a superior and inferior manner with good visualization of the bowel and bladder.  The Alexis instrument was then placed and the vesico-uterine fascia tented up and incised in a transverse curvilinear manner.  A 2 cm transverse incision was made in the upper portion of the lower uterine segment until the amnion was exposed.   The incision was extended  transversely in a blunt manner.  Clear fluid was noted and the baby delivered in the vertex presentation without complication.  The baby was bulb suctioned and the cord was clamped and cut.  The baby was then handed to awaiting Neonatology.  The placenta was then delivered manually and the uterus cleared of all debris.  The uterine incision was then closed with a running lock stitch of 0 monocryl.  An imbricating layer of 0 monocryl was closed as well. Excellent hemostasis of the uterine incision was achieved and the abdomen was cleared with irrigation.  The peritoneum was closed with a running stitch of 2-0 vicryl.  This incorporated the rectus muscles as a separate layer.  The fascia was then closed with a running stitch of 0 vicryl.  The subcutaneous layer was closed with interrupted  stitches of 2-0 plain gut.  The skin was closed with 4-0 vicryl on a Keith needle and steri-strips.  The patient tolerated the procedure well and was returned to the recovery room in stable condition.  All counts were correct times three.  Jeanmarc Viernes A

## 2014-07-10 NOTE — Addendum Note (Signed)
Addendum created 07/10/14 1616 by Asher Muir, CRNA   Modules edited: Notes Section   Notes Section:  File: [id number]

## 2014-07-10 NOTE — Anesthesia Procedure Notes (Signed)
Spinal  Patient location during procedure: OR Start time: 07/10/2014 11:04 AM Staffing Anesthesiologist: Rudean Curt Performed by: anesthesiologist  Preanesthetic Checklist Completed: patient identified, site marked, surgical consent, pre-op evaluation, timeout performed, IV checked, risks and benefits discussed and monitors and equipment checked Spinal Block Patient position: sitting Prep: DuraPrep Patient monitoring: heart rate, cardiac monitor, continuous pulse ox and blood pressure Approach: midline Location: L3-4 Injection technique: single-shot Needle Needle type: Sprotte  Needle gauge: 24 G Needle length: 9 cm Assessment Sensory level: T4 Additional Notes Patient identified.  Risk benefits discussed including failed block, incomplete pain control, headache, nerve damage, paralysis, blood pressure changes, nausea, vomiting, reactions to medication both toxic or allergic, and postpartum back pain.  Patient expressed understanding and wished to proceed.  All questions were answered.  Sterile technique used throughout procedure.  CSF was clear.  No parasthesia or other complications.  Please see nursing notes for vital signs.

## 2014-07-10 NOTE — Transfer of Care (Signed)
Immediate Anesthesia Transfer of Care Note  Patient: [name]  Procedure(s) Performed: Procedure(s): REPEAT CESAREAN SECTION (N/A)  Patient Location: PACU  Anesthesia Type:Spinal  Level of Consciousness: awake, alert  and oriented  Airway & Oxygen Therapy: Patient Spontanous Breathing  Post-op Assessment: Report given to PACU RN and Post -op Vital signs reviewed and stable  Post vital signs: Reviewed and stable  Complications: No apparent anesthesia complications

## 2014-07-10 NOTE — Anesthesia Postprocedure Evaluation (Signed)
Anesthesia Post Note  Patient: [name]  Procedure(s) Performed: Procedure(s) (LRB): REPEAT CESAREAN SECTION (N/A)  Anesthesia type: Spinal  Patient location: Mother/Baby  Post pain: Pain level controlled  Post assessment: Post-op Vital signs reviewed  Last Vitals:  Filed Vitals:   07/10/14 1442  BP: 125/50  Pulse: 66  Temp: 36.6 C  Resp: 18    Post vital signs: Reviewed  Level of consciousness: awake  Complications: No apparent anesthesia complications

## 2014-07-11 ENCOUNTER — Encounter (HOSPITAL_COMMUNITY): Payer: Self-pay | Admitting: Obstetrics and Gynecology

## 2014-07-11 LAB — CBC
HCT: 28.9 % — ABNORMAL LOW (ref 36.0–46.0)
Hemoglobin: 9.4 g/dL — ABNORMAL LOW (ref 12.0–15.0)
MCH: 27.6 pg (ref 26.0–34.0)
MCHC: 32.5 g/dL (ref 30.0–36.0)
MCV: 85 fL (ref 78.0–100.0)
Platelets: 263 10*3/uL (ref 150–400)
RBC: 3.4 MIL/uL — ABNORMAL LOW (ref 3.87–5.11)
RDW: 16 % — ABNORMAL HIGH (ref 11.5–15.5)
WBC: 14.3 10*3/uL — ABNORMAL HIGH (ref 4.0–10.5)

## 2014-07-11 NOTE — Progress Notes (Signed)
  Patient is eating, ambulating.  Foley just removed, has not yet voided.  Pain control is good.  Lochia is appropriate  Filed Vitals:   07/10/14 2048 07/10/14 2050 07/10/14 2330 07/11/14 0415  BP: 130/70 129/70 129/68 119/64  Pulse: 58 66 56 56  Temp:   97.8 F (36.6 C) 98.3 F (36.8 C)  TempSrc:   Oral Oral  Resp:   18 18  Height:      Weight:      SpO2: 97%  97% 98%    lungs:   clear to auscultation cor:    RRR Abdomen:  soft, appropriate tenderness, bandage c/d/i without shadowing ex:    Minimal swelling, symmetric   Lab Results  Component Value Date   WBC 10.4 07/10/2014   HGB 9.9* 07/10/2014   HCT 30.6* 07/10/2014   MCV 84.5 07/10/2014   PLT 257 07/10/2014     A/P    Post operative day 1 s/p RCS and BTL  AM CBC pending.  Routine post op and postpartum care.  Expect d/c tomorrow.  Considering circ.  Undecided, but would obtain at office.   Rh+

## 2014-07-11 NOTE — Lactation Note (Signed)
This note was copied from the chart of [place]. Lactation Consultation Note    Follow up consult with this mom of a term baby, now 61 hours old. Mom reports breast feeding going well, and denies needing any assistance at this time. She is an experienced breast feeder, and will call for lactation prn.  Patient Name: [name] [id number] Date: 07/11/2014 Reason for consult: Follow-up assessment   Maternal Data    Feeding Feeding Type: Breast Fed  LATCH Score/Interventions Latch: Grasps breast easily, tongue down, lips flanged, rhythmical sucking.  Audible Swallowing: A few with stimulation Intervention(s): Hand expression  Type of Nipple: Everted at rest and after stimulation  Comfort (Breast/Nipple): Soft / non-tender     Hold (Positioning): No assistance needed to correctly position infant at breast.  LATCH Score: 9  Lactation Tools Discussed/Used     Consult Status Consult Status: Complete Follow-up type: Call as needed    Tonna Corner 07/11/2014, 12:10 PM

## 2014-07-12 MED ORDER — IBUPROFEN 600 MG PO TABS: 600.0000 mg | ORAL_TABLET | Freq: Four times a day (QID) | ORAL | Status: DC | PRN

## 2014-07-12 MED ORDER — DOCUSATE SODIUM 100 MG PO CAPS: 100.0000 mg | ORAL_CAPSULE | Freq: Two times a day (BID) | ORAL | Status: DC

## 2014-07-12 MED ORDER — OXYCODONE-ACETAMINOPHEN 5-325 MG PO TABS: 1.0000 | ORAL_TABLET | ORAL | Status: DC | PRN

## 2014-07-12 NOTE — Lactation Note (Signed)
This note was copied from the chart of [place]. Lactation Consultation Note  Reviewed how to achieve a deeper latch.  Provided mother with comfort gels for soreness and reviewed apply ebm. Discussed engorgement care and suggest she call if she has further questions.  Patient Name: [name] [id number] Date: 07/12/2014 Reason for consult: Follow-up assessment   Maternal Data    Feeding    LATCH Score/Interventions                      Lactation Tools Discussed/Used     Consult Status Consult Status: Complete    Carlye Grippe 07/12/2014, 11:17 AM

## 2014-07-12 NOTE — Discharge Summary (Signed)
Obstetric Discharge Summary Reason for Admission: cesarean section Prenatal Procedures: NST and ultrasound Intrapartum Procedures: cesarean: low cervical, transverse and tubal ligation Postpartum Procedures: none Complications-Operative and Postpartum: none Hemoglobin  Date Value Ref Range Status  07/11/2014 9.4* 12.0 - 15.0 g/dL Final     HCT  Date Value Ref Range Status  07/11/2014 28.9* 36.0 - 46.0 % Final    Physical Exam:  General: alert, cooperative and appears stated age Lochia: appropriate Uterine Fundus: firm Incision: dressing with blood soiling. Will replace prior to discharge DVT Evaluation: No evidence of DVT seen on physical exam.  Discharge Diagnoses: Term Pregnancy-delivered  Discharge Information: Date: 07/12/2014 Activity: pelvic rest Diet: routine Medications: Ibuprofen, Colace and Percocet Condition: improved Instructions: refer to practice specific booklet Discharge to: home Follow-up Information   Follow up with HORVATH,MICHELLE A, MD In 4 weeks. (For a post-operative evaluation)    Specialty:  Obstetrics and Gynecology   Contact information:   [place] [id number] [phone]       Newborn Data: Live born female  Birth Weight: 7 lb 3 oz (3260 g) APGAR: 8, 8  Home with mother.  Courtney Jernberg H. 07/12/2014, 11:27 AM

## 2014-09-02 ENCOUNTER — Encounter (HOSPITAL_COMMUNITY): Payer: Self-pay | Admitting: Obstetrics and Gynecology

## 2014-12-24 ENCOUNTER — Telehealth: Payer: Self-pay | Admitting: Family Medicine

## 2014-12-24 DIAGNOSIS — L409 Psoriasis, unspecified: Secondary | ICD-10-CM

## 2014-12-24 NOTE — Telephone Encounter (Signed)
Needs referral to [facility]

## 2014-12-24 NOTE — Telephone Encounter (Signed)
Referral has been placed.   Thanks, Archie Patten, MD [facility] Resident  12/24/2014, 9:08 PM

## 2014-12-25 ENCOUNTER — Telehealth: Payer: Self-pay | Admitting: Family Medicine

## 2014-12-25 NOTE — Telephone Encounter (Signed)
[name] need another referral to be sent to [facility] in [place].  Please put to attn of:  [name].

## 2014-12-26 NOTE — Telephone Encounter (Signed)
New referral faxed to [facility].

## 2015-01-03 ENCOUNTER — Ambulatory Visit (INDEPENDENT_AMBULATORY_CARE_PROVIDER_SITE_OTHER): Payer: Medicaid Other | Admitting: Family Medicine

## 2015-01-03 ENCOUNTER — Other Ambulatory Visit: Payer: Self-pay | Admitting: Family Medicine

## 2015-01-03 ENCOUNTER — Encounter: Payer: Self-pay | Admitting: Family Medicine

## 2015-01-03 VITALS — BP 159/99 | HR 87 | Temp 98.0°F | Ht 67.0 in | Wt 215.0 lb

## 2015-01-03 DIAGNOSIS — R229 Localized swelling, mass and lump, unspecified: Secondary | ICD-10-CM

## 2015-01-03 DIAGNOSIS — Z0000 Encounter for general adult medical examination without abnormal findings: Secondary | ICD-10-CM

## 2015-01-03 DIAGNOSIS — I10 Essential (primary) hypertension: Secondary | ICD-10-CM

## 2015-01-03 DIAGNOSIS — R1310 Dysphagia, unspecified: Secondary | ICD-10-CM

## 2015-01-03 MED ORDER — PANTOPRAZOLE SODIUM 40 MG PO TBEC: 40.0000 mg | DELAYED_RELEASE_TABLET | Freq: Every day | ORAL | Status: DC

## 2015-01-03 MED ORDER — AMLODIPINE BESYLATE 5 MG PO TABS: 5.0000 mg | ORAL_TABLET | Freq: Every day | ORAL | Status: DC

## 2015-01-03 NOTE — Assessment & Plan Note (Signed)
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 - Will obtain an abdominal U/S to confirm - Referral to surgery for excision if possible, patient would prefer if it was not [facility] given an unfortunate chain of events in the past. - Continue to monitor

## 2015-01-03 NOTE — Assessment & Plan Note (Addendum)
Given prolonged course that has progressively worsened, I am concerned about esophageal webs vs stricture vs mass (pt was previously a smoker and patietn is on immunosuppressives for psoriasis ). This could also be untreated GERD, but given emesis with each meal I feel this warrants further workup. - Protonix 40mg  daily - Referral to GI: consider EGD vs upper GI follow through

## 2015-01-03 NOTE — Assessment & Plan Note (Signed)
Patient has not been on medication since she was 3 months pregnant. Per her report, her BPs improved during pregnancy therefore they discontinued her medications.  She is not breastfeeding. BP was elevated on repeat check today. Per patient report, her BP was elevated at her dermatologist's office today. - Will restart amlodipine 5mg  daily - F/u in 2 weeks for nursing BP check  - Lipid panel was drawn today at her dermatologist's office in [facility]- will have this faxed over.

## 2015-01-03 NOTE — Patient Instructions (Signed)
I was great to meet you. I have started you back on amlodipine 5mg  for your blood pressure and Protonix for the stomach concerns. I have referred you to GI and to surgery. Please have the dermatologist fax over the lipid panel Please make a nurses visit in 2 weeks.  Please see me back in 3 months or sooner if necessary.

## 2015-01-03 NOTE — Progress Notes (Signed)
Patient ID: Tranika Scholler, female   DOB: 1981-03-01, 34 y.o.   MRN: [id number] 34 y.o. year old female presents for well woman/preventative visit.  Acute Concerns:  Dysphagia: Patient states that she's had the sensation of food getting stuck "midway" since 2009, however it has progressively gotten worse. She notes that it feels like it gets stuck in her chest, not in her throat. She feels like she is "choking and can't breath." She vomits after eating because that is the only way to make the sensation go away. The emesis appears like recently eaten food with some specks of bright red blood at the end. This does not happen with liquids alone. It appears to happen more with meats and starchy foods.   She also has a burning sensation in her chest with this pain. She has tried to eat slower, drink more liquids, and taken Tums preemptively without improvement. Has inhibited her from eating, however no weight loss. She smoked in the past but not currently. Occasional alcohol use. (on her abdomen which has been very bothersome. It has been progressively getting larger and she now has sharp pains due it's size. The area is below the tissue so there is not discharge, smell, or bleeding. She states she had one removed previously and is interested in having this one removed.   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 She notes it has been progressive getting larger over the last year and is now painful. No discharge, smell, bleeding, or h/o trauma. She would like this removed if possible given the pain.   Birth Control: BTL after last delivery   Social:  Married with 3 children Past smoker Occasional EtOH Works as a Biomedical scientist  Patient declines flu vaccine today  Cancer Screening:  Pap Smear: Due for one with OB in April 2016  Mammogram: N/A  Colonoscopy: N/A  Dexa:N/A  Physical Exam: Blood pressure 159/99, pulse 87,  temperature 98 F (36.7 C), temperature source Oral, height 5\' 7"  (1.702 m), weight 215 lb (97.523 kg)  Repeat BP 142/96  General -- oriented x3, pleasant and cooperative. HEENT -- Head is normocephalic. PERRLA. EOMI. Ears, nose and throat were benign. Neck -- supple; no bruits. Integument -- Erythematous plaques with silver scaling over arms, legs, and abdomen Chest -- good expansion. Lungs clear to auscultation. No wheezing, rhonchi, crackle Cardiac -- RRR. No murmurs, rubs, or gallops.  Abdomen -- soft, nontender. Bowel sounds present. Approximately 2x3cm non-mobile soft tissue mass on the right lateral to the umbilicus Genital, rectal and breast exam -- deferred. CNS -- cranial nerves II through XII grossly intact. 2+ reflexes bilaterally. Extremeties - no tenderness or effusions noted. ROM good. 5/5 bilateral strength. Dorsalis pedis pulses present and symmetrical.   ASSESSMENT & PLAN: 34 y.o. female presents for annual well woman/preventative exam.  Please see problem specific assessment and plan.

## 2015-01-20 NOTE — Progress Notes (Signed)
I was the preceptor for this encounter. Sindia Kowalczyk, M.D. 

## 2015-01-20 NOTE — Progress Notes (Signed)
I was the preceptor for this encounter. Vernel Donlan, M.D. 

## 2015-01-22 ENCOUNTER — Emergency Department (HOSPITAL_COMMUNITY)
Admission: EM | Admit: 2015-01-22 | Discharge: 2015-01-22 | Disposition: A | Discharge status: Home / Self Care | Payer: Medicaid Other | Attending: Emergency Medicine | Admitting: Emergency Medicine

## 2015-01-22 ENCOUNTER — Encounter (HOSPITAL_COMMUNITY): Payer: Self-pay | Admitting: *Deleted

## 2015-01-22 DIAGNOSIS — Z872 Personal history of diseases of the skin and subcutaneous tissue: Secondary | ICD-10-CM | POA: Diagnosis not present

## 2015-01-22 DIAGNOSIS — Z3202 Encounter for pregnancy test, result negative: Secondary | ICD-10-CM | POA: Diagnosis not present

## 2015-01-22 DIAGNOSIS — Z8742 Personal history of other diseases of the female genital tract: Secondary | ICD-10-CM | POA: Diagnosis not present

## 2015-01-22 DIAGNOSIS — Z720 Tobacco use: Secondary | ICD-10-CM | POA: Insufficient documentation

## 2015-01-22 DIAGNOSIS — R109 Unspecified abdominal pain: Secondary | ICD-10-CM | POA: Insufficient documentation

## 2015-01-22 DIAGNOSIS — R1110 Vomiting, unspecified: Secondary | ICD-10-CM

## 2015-01-22 DIAGNOSIS — I10 Essential (primary) hypertension: Secondary | ICD-10-CM | POA: Insufficient documentation

## 2015-01-22 DIAGNOSIS — R197 Diarrhea, unspecified: Secondary | ICD-10-CM | POA: Diagnosis not present

## 2015-01-22 DIAGNOSIS — M545 Low back pain: Secondary | ICD-10-CM | POA: Insufficient documentation

## 2015-01-22 DIAGNOSIS — R112 Nausea with vomiting, unspecified: Secondary | ICD-10-CM | POA: Diagnosis present

## 2015-01-22 LAB — URINALYSIS, ROUTINE W REFLEX MICROSCOPIC
Bilirubin Urine: NEGATIVE
Glucose, UA: NEGATIVE mg/dL
Ketones, ur: NEGATIVE mg/dL
Leukocytes, UA: NEGATIVE
Nitrite: NEGATIVE
Protein, ur: 100 mg/dL — AB
Specific Gravity, Urine: 1.025 (ref 1.005–1.030)
Urobilinogen, UA: 0.2 mg/dL (ref 0.0–1.0)
pH: 5.5 (ref 5.0–8.0)

## 2015-01-22 LAB — I-STAT CHEM 8, ED
BUN: 8 mg/dL (ref 6–23)
Calcium, Ion: 1.11 mmol/L — ABNORMAL LOW (ref 1.12–1.23)
Chloride: 104 mmol/L (ref 96–112)
Creatinine, Ser: 0.5 mg/dL (ref 0.50–1.10)
Glucose, Bld: 163 mg/dL — ABNORMAL HIGH (ref 70–99)
HCT: 43 % (ref 36.0–46.0)
Hemoglobin: 14.6 g/dL (ref 12.0–15.0)
Potassium: 3.8 mmol/L (ref 3.5–5.1)
Sodium: 139 mmol/L (ref 135–145)
TCO2: 19 mmol/L (ref 0–100)

## 2015-01-22 LAB — POC URINE PREG, ED: Preg Test, Ur: NEGATIVE

## 2015-01-22 LAB — URINE MICROSCOPIC-ADD ON

## 2015-01-22 LAB — POC OCCULT BLOOD, ED: Fecal Occult Bld: NEGATIVE

## 2015-01-22 MED ORDER — ACETAMINOPHEN 325 MG PO TABS
650.0000 mg | ORAL_TABLET | Freq: Once | ORAL | Status: AC
  Administered 2015-01-22: 650 mg via ORAL
  Filled 2015-01-22: qty 2

## 2015-01-22 MED ORDER — ONDANSETRON 4 MG PO TBDP: ORAL_TABLET | ORAL | Status: DC

## 2015-01-22 MED ORDER — SODIUM CHLORIDE 0.9 % IV BOLUS (SEPSIS)
1000.0000 mL | Freq: Once | INTRAVENOUS | Status: AC
  Administered 2015-01-22: 1000 mL via INTRAVENOUS

## 2015-01-22 MED ORDER — ONDANSETRON HCL 4 MG/2ML IJ SOLN
4.0000 mg | Freq: Once | INTRAMUSCULAR | Status: AC
  Administered 2015-01-22: 4 mg via INTRAVENOUS
  Filled 2015-01-22: qty 2

## 2015-01-22 NOTE — ED Notes (Signed)
Pt states around midnight started having diarrhea, states she's had diarrhea about every 30 minutes, then just started having nausea and vomiting, states 2 episodes. Pt states having abdominal pain and back pain also.

## 2015-01-22 NOTE — ED Provider Notes (Signed)
CSN: [id number]     Arrival date & time 01/22/15  4709 History   First MD Initiated Contact with Patient 01/22/15 [phone]     Chief Complaint  Patient presents with  . Emesis  . Diarrhea     (Consider location/radiation/quality/duration/timing/severity/associated sxs/prior Treatment) Patient is a 34 y.o. female presenting with vomiting and diarrhea. The history is provided by the patient.  Emesis Severity:  Mild Timing:  Constant Quality:  Stomach contents Progression:  Unchanged Chronicity:  New Recent urination:  Normal Relieved by:  Nothing Worsened by:  Nothing tried Ineffective treatments:  None tried Associated symptoms: abdominal pain and diarrhea   Associated symptoms: no headaches   Abdominal pain:    Location:  Generalized   Quality:  Cramping   Severity:  Mild   Onset quality:  Gradual   Timing:  Intermittent   Chronicity:  New Diarrhea:    Quality:  Watery (dark)   Severity:  Mild   Timing:  Constant   Progression:  Unchanged Diarrhea Associated symptoms: abdominal pain and vomiting   Associated symptoms: no fever and no headaches     Past Medical History  Diagnosis Date  . Psoriasis   . Abnormal Pap smear     patient states she had "cervical cancer" at 34 yo for which she had removal of atypical cells.   . Ovarian cyst rupture   . Vaginal Pap smear, abnormal   . Hypertension     no meds currently  . Complication of anesthesia 2014    pt states she had "bad reaction"- "they thought I had a stroke"   Past Surgical History  Procedure Laterality Date  . Cholecystectomy  2009  . Cesarean section  2009, 2011  . Cryotherapy    . Back surgery    . Cesarean section N/A 07/10/2014    Procedure: REPEAT CESAREAN SECTION;  Surgeon: Daria Pastures, MD;  Location: [facility];  Service: Obstetrics;  Laterality: N/A;   Family History  Problem Relation Age of Onset  . Drug abuse Mother   . Heart disease Maternal Grandmother   . Heart disease Maternal  Grandfather   . Hearing loss Neg Hx    History  Substance Use Topics  . Smoking status: Current Some Day Smoker -- 0.50 packs/day for 20 years    Types: Cigarettes  . Smokeless tobacco: Never Used     Comment: with preg  . Alcohol Use: No   OB History    Gravida Para Term Preterm AB TAB SAB Ectopic Multiple Living   8 3 3  5  5   3      Review of Systems  Constitutional: Negative for fever and fatigue.  HENT: Negative for congestion and drooling.   Eyes: Negative for pain.  Respiratory: Negative for cough and shortness of breath.   Cardiovascular: Negative for chest pain.  Gastrointestinal: Positive for nausea, vomiting, abdominal pain and diarrhea.  Genitourinary: Negative for dysuria and hematuria.  Musculoskeletal: Positive for back pain. Negative for gait problem and neck pain.  Skin: Negative for color change.  Neurological: Negative for dizziness and headaches.  Hematological: Negative for adenopathy.  Psychiatric/Behavioral: Negative for behavioral problems.  All other systems reviewed and are negative.     Allergies  Penicillins  Home Medications   Prior to Admission medications   Medication Sig Start Date End Date Taking? Authorizing Provider  amLODipine (NORVASC) 5 MG tablet Take 1 tablet (5 mg total) by mouth daily. 01/03/15   Archie Patten, MD  ibuprofen (ADVIL,MOTRIN) 600 MG tablet Take 1 tablet (600 mg total) by mouth every 6 (six) hours as needed. 07/12/14   Vanessa Kick, MD  pantoprazole (PROTONIX) 40 MG tablet Take 1 tablet (40 mg total) by mouth daily. 01/03/15   Archie Patten, MD   BP 146/93 mmHg  Pulse 105  Temp(Src) 97.5 F (36.4 C) (Oral)  Resp 18  Ht 5\' 6"  (1.676 m)  Wt 215 lb (97.523 kg)  BMI 34.72 kg/m2  SpO2 99%  LMP 01/20/2015 Physical Exam  Constitutional: She is oriented to person, place, and time. She appears well-developed and well-nourished.  HENT:  Head: Normocephalic and atraumatic.  Mouth/Throat: Oropharynx is clear and  moist. No oropharyngeal exudate.  Eyes: Conjunctivae and EOM are normal. Pupils are equal, round, and reactive to light.  Neck: Normal range of motion. Neck supple.  Cardiovascular: Regular rhythm, normal heart sounds and intact distal pulses.  Exam reveals no gallop and no friction rub.   No murmur heard. HR 104  Pulmonary/Chest: Effort normal and breath sounds normal. No respiratory distress. She has no wheezes.  Abdominal: Soft. Bowel sounds are normal. There is no tenderness. There is no rebound and no guarding.  Genitourinary:  Normal-appearing external rectum. Normal palpation during rectal exam. Rectal mucus without evidence of blood.  Musculoskeletal: Normal range of motion. She exhibits no edema or tenderness.  Neurological: She is alert and oriented to person, place, and time.  Skin: Skin is warm and dry.  Psychiatric: She has a normal mood and affect. Her behavior is normal.  Nursing note and vitals reviewed.   ED Course  Procedures (including critical care time) Labs Review Labs Reviewed  URINALYSIS, ROUTINE W REFLEX MICROSCOPIC - Abnormal; Notable for the following:    Hgb urine dipstick TRACE (*)    Protein, ur 100 (*)    All other components within normal limits  URINE MICROSCOPIC-ADD ON - Abnormal; Notable for the following:    Squamous Epithelial / LPF FEW (*)    All other components within normal limits  I-STAT CHEM 8, ED - Abnormal; Notable for the following:    Glucose, Bld 163 (*)    Calcium, Ion 1.11 (*)    All other components within normal limits  POC URINE PREG, ED  POC OCCULT BLOOD, ED    Imaging Review No results found.   EKG Interpretation None      MDM   Final diagnoses:  Vomiting and diarrhea    7:04 AM 34 y.o. female w hx of HTN who presents with diarrhea, nausea, and vomiting which began several hours ago. She has also had some mild abdominal cramping and some mild low back pain. She denies any fevers. She is afebrile vital signs are  unremarkable here, mild tachycardia noted on triage VS. Abdomen soft and benign without any focal tenderness. Likely a viral gastroenteritis. She does note that her stool and emesis appears dark and I will perform a Hemoccult. Will treat symptomatically and get a screening i-STAT Chem-8. Likely a viral gastroenteritis.   8:44 AM: I interpreted/reviewed the labs and/or imaging which were non-contributory. Pt cont to appear well. Hemeoccult neg.  I have discussed the diagnosis/risks/treatment options with the patient and family and believe the pt to be eligible for discharge home to follow-up with her pcp as needed. We also discussed returning to the ED immediately if new or worsening sx occur. We discussed the sx which are most concerning (e.g., worsening pain, fever, intractable vomiting) that necessitate immediate return.  Medications administered to the patient during their visit and any new prescriptions provided to the patient are listed below.  Medications given during this visit Medications  sodium chloride 0.9 % bolus 1,000 mL (0 mLs Intravenous Stopped 01/22/15 0830)  ondansetron (ZOFRAN) injection 4 mg (4 mg Intravenous Given 01/22/15 0731)  acetaminophen (TYLENOL) tablet 650 mg (650 mg Oral Given 01/22/15 0731)    New Prescriptions   ONDANSETRON (ZOFRAN ODT) 4 MG DISINTEGRATING TABLET    4mg  ODT q4 hours prn nausea/vomit     Pamella Pert, MD 01/22/15 [phone]

## 2015-01-23 ENCOUNTER — Ambulatory Visit: Admit: 2015-01-23 | Payer: Self-pay

## 2015-02-06 ENCOUNTER — Encounter: Payer: Self-pay | Admitting: Gastroenterology

## 2015-02-07 ENCOUNTER — Encounter: Payer: Self-pay | Admitting: Obstetrics and Gynecology

## 2015-02-07 NOTE — Progress Notes (Signed)
This is an addendum to Pt's OP Note for Cesarean Section on 9- 07-2014.  The BTL portion of the procedure had not been documented and it is documented here.  Pt had undergone a routine repeat C/S with delivery of healthy baby.  After repairing the uterus and ensuring hemostasis, I asked the pt again if she wanted permenant sterilzation.  She stated yes.    At this point the tubal ligation was begun.  Prior to the surgery the patient was counseled that the procedure was permanent and all risks, benefits, and alternatives had been discussed.  The each tube in succession was tented up with two babcocks and the filschie clips were placed on the isthmic portion of each tube.  The clips were confirmed to occlude the entire circumference of each tube.  The rest of the operation continued as in the OP note from 07-10-14.   [name] A

## 2015-03-06 ENCOUNTER — Encounter: Payer: Self-pay | Admitting: Family Medicine

## 2015-03-06 ENCOUNTER — Ambulatory Visit (INDEPENDENT_AMBULATORY_CARE_PROVIDER_SITE_OTHER): Payer: Medicaid Other | Admitting: Family Medicine

## 2015-03-06 VITALS — BP 138/90 | HR 80 | Temp 98.8°F | Ht 66.0 in | Wt 216.0 lb

## 2015-03-06 DIAGNOSIS — R109 Unspecified abdominal pain: Secondary | ICD-10-CM | POA: Insufficient documentation

## 2015-03-06 DIAGNOSIS — R1310 Dysphagia, unspecified: Secondary | ICD-10-CM

## 2015-03-06 DIAGNOSIS — I10 Essential (primary) hypertension: Secondary | ICD-10-CM | POA: Diagnosis not present

## 2015-03-06 DIAGNOSIS — R229 Localized swelling, mass and lump, unspecified: Secondary | ICD-10-CM

## 2015-03-06 LAB — POCT URINALYSIS DIPSTICK
Bilirubin, UA: NEGATIVE
Blood, UA: NEGATIVE
Glucose, UA: NEGATIVE
Ketones, UA: NEGATIVE
Leukocytes, UA: NEGATIVE
Nitrite, UA: NEGATIVE
Spec Grav, UA: 1.025
Urobilinogen, UA: 0.2
pH, UA: 6

## 2015-03-06 NOTE — Assessment & Plan Note (Signed)
Stable on currently regimen. No side effects.  - Continue amlodipine 5mg . - patient to f/u in 3 months

## 2015-03-06 NOTE — Assessment & Plan Note (Signed)
Improved per report with PPI, but still occasionally has symptoms.  Patient to f/u with GI at the end of the month.

## 2015-03-06 NOTE — Assessment & Plan Note (Signed)
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 Appears too deep for intervention in the clinic. - Pt to be scheduled for an U/S  - After U/S, referral to surgery for possible excision given pain . - Continue to monitor

## 2015-03-06 NOTE — Assessment & Plan Note (Signed)
Patient has a h/o right sided back pain. Most likely paraspinal muscle tension, however given +CVA and urinary frequency, will obtain a U/A. No h/o kidney stones, no dysuria, no hematuria.  - f/u U/A. - If U/A negative, continue with Ibuprofen with RTC precautions discussed.

## 2015-03-06 NOTE — Progress Notes (Signed)
Patient ID: [name], female   DOB: Apr 14, 1981, 34 y.o.   MRN: [id number].     Subjective: CC:f/u HTN, stomach tumors HPI: Patient is a 34 y.o. female with a past medical history of lipomas, HTN, psoriasis presenting to clinic today for concerns of R flank pain, fu/ HTN.  Hypertension Blood pressure at home: 140s-120s/80s-90s Blood pressure today: 150/100 Meds: Compliant with amlodipine, however she hasn't taken it this AM (took it in the office) Side effects: None ROS: Denies headache, dizziness, visual changes, nausea, vomiting, chest pain, abdominal pain or shortness of breath. They're going to starting weaning current psoriasis medication responsible for elevated BPs per pt (does not know the name) and transition to methotrexate.   "Tumors" in stomach tissue:  Per the patient, she has 3 fat tumors, similar to what she had on her left flank (from reviewing the EMR, a lipoma). Patient feels the nodule has grown since the last time she was here.  She can now find 3 (previously only noted 2). The areas very painful. No redness or drainage, bleeding, or h/o trauma. She would like this removed if possible given the pain. U/S was ordered at last visit however pt did not get this done.  Right flank pain: Noted it began last week. No dysuria or hematuria. +Urinary frequency. No abnormal urine odor. No h/o back trauma. No h/o kidney stones in the past. No fevers/chills. Ibuprofen tends to help  Social History: Denies smoking  Health Maintenance: Pt due for pap smear (by OB/gyn).   ROS: All other systems reviewed and are negative.  Past Medical History Patient Active Problem List   Diagnosis Date Noted  . Right flank discomfort 03/06/2015  . Dysphagia 01/03/2015  . Postoperative state 07/10/2014  . Pregnancy examination or test, positive result 11/27/2013  . Dizzy spells 10/01/2013  . Psoriasis 10/01/2013  . Shingles 08/22/2013  . Labial abscess 07/09/2013  . Hypertension  09/26/2012  . Obesity 09/26/2012  . Subcutaneous nodule 09/26/2012  . History of abnormal Pap smear 09/26/2012    Medications- reviewed and updated Current Outpatient Prescriptions  Medication Sig Dispense Refill  . acetaminophen (TYLENOL) 650 MG CR tablet Take 1,300 mg by mouth every 8 (eight) hours as needed for pain.    . Adalimumab 40 MG/0.8ML PSKT Inject 40 mg into the skin every 14 (fourteen) days.    Marland Kitchen amLODipine (NORVASC) 5 MG tablet Take 1 tablet (5 mg total) by mouth daily. 30 tablet 3  . clobetasol cream (TEMOVATE) 8.09 % Apply 1 application topically 2 (two) times daily as needed (for skin).    Marland Kitchen ibuprofen (ADVIL,MOTRIN) 600 MG tablet Take 1 tablet (600 mg total) by mouth every 6 (six) hours as needed. (Patient taking differently: Take 600 mg by mouth every 4 (four) hours as needed. ) 90 tablet 0  . ondansetron (ZOFRAN ODT) 4 MG disintegrating tablet 4mg  ODT q4 hours prn nausea/vomit 15 tablet 0  . pantoprazole (PROTONIX) 40 MG tablet Take 1 tablet (40 mg total) by mouth daily. 30 tablet 3   No current facility-administered medications for this visit.    Objective: Office vital signs reviewed. BP 138/90 mmHg  Pulse 80  Temp(Src) 98.8 F (37.1 C) (Oral)  Ht 5\' 6"  (1.676 m)  Wt 216 lb (97.977 kg)  BMI 34.88 kg/m2  LMP 02/14/2015   Physical Examination:  General: Awake, alert, well- nourished, NAD Cardio: RRR, no m/r/g noted.  Pulm: No increased WOB.  CTAB, without wheezes, rhonchi or crackles noted.  GI: soft,  non-distended. +BS. Tenderness diffusely due to palpation of subcutaneous nodules. Extremities:No edema, cyanosis or clubbing MSK: Normal gait and station. No tenderness over the spinous processes. Acutely tender to palpation over the R lumbar/flank region. +CVA tenderness on the right.  Skin: Approximately 2x4cm non-mobile soft tissue mass on the right abdomen lateral to the umbilicus. Small 1x2cm nodule superior to the previous nodule.  Small 1x1cm nodule to  the left of the umbilicus. All these are better palpated while the pt is standing, difficult to palpate with lying supine.   Assessment/Plan: Dysphagia Improved per report with PPI, but still occasionally has symptoms.  Patient to f/u with GI at the end of the month.   Hypertension Stable on currently regimen. No side effects.  - Continue amlodipine 5mg . - patient to f/u in 3 months    Subcutaneous nodule Most likely a lipoma from history and exam. Appears too deep for intervention in the clinic. - Pt to be scheduled for an U/S  - After U/S, referral to surgery for possible excision given pain . - Continue to monitor    Right flank discomfort Patient has a h/o right sided back pain. Most likely paraspinal muscle tension, however given +CVA and urinary frequency, will obtain a U/A. No h/o kidney stones, no dysuria, no hematuria.  - f/u U/A. - If U/A negative, continue with Ibuprofen with RTC precautions discussed.      Orders Placed This Encounter  Procedures  . Urinalysis Dipstick    No orders of the defined types were placed in this encounter.    Archie Patten PGY-1, [place]

## 2015-03-06 NOTE — Patient Instructions (Signed)
It was good to see you. I have ordered an ultrasound of your stomach to look at those nodules (most likely lipomas again). After that, I will refer you to surgery. Please keep your appointment with the GI doctor for the sensation of things getting stuck in your throat and vomiting (even though it has gotten better) I will call you if your urine looks like there's an infection there. Please follow up with me in 3 months or sooner as needed.   SEEK IMMEDIATE MEDICAL CARE IF:   You have a fever or shaking chills.  The severity or intensity of pain increases over 18 hours and is not relieved by Ibuprofen.  You develop a new onset of abdominal pain.  You feel faint or pass out.  You are unable to urinate.

## 2015-03-07 NOTE — Progress Notes (Signed)
I was preceptor the day of this visit.   

## 2015-03-17 ENCOUNTER — Other Ambulatory Visit: Payer: Self-pay | Admitting: Family Medicine

## 2015-03-17 ENCOUNTER — Ambulatory Visit (HOSPITAL_COMMUNITY)
Admission: RE | Admit: 2015-03-17 | Discharge: 2015-03-17 | Disposition: A | Discharge status: Home / Self Care | Payer: Medicaid Other | Source: Ambulatory Visit | Attending: Family Medicine | Admitting: Family Medicine

## 2015-03-17 DIAGNOSIS — R1905 Periumbilic swelling, mass or lump: Secondary | ICD-10-CM | POA: Insufficient documentation

## 2015-03-17 DIAGNOSIS — R229 Localized swelling, mass and lump, unspecified: Secondary | ICD-10-CM

## 2015-03-20 ENCOUNTER — Telehealth: Payer: Self-pay | Admitting: Family Medicine

## 2015-03-20 DIAGNOSIS — D171 Benign lipomatous neoplasm of skin and subcutaneous tissue of trunk: Secondary | ICD-10-CM

## 2015-03-20 NOTE — Telephone Encounter (Signed)
Called and updated pt on U/S findings and referral to surgery for possible lipoma removal. Pls let pt know when this appt is set up.  Thanks, Archie Patten, MD [facility] Resident  03/20/2015, 3:29 PM

## 2015-03-20 NOTE — Telephone Encounter (Signed)
Would like ultrasound results

## 2015-04-01 ENCOUNTER — Ambulatory Visit: Payer: Medicaid Other | Admitting: Gastroenterology

## 2015-04-14 ENCOUNTER — Ambulatory Visit: Payer: Self-pay | Admitting: Surgery

## 2015-04-14 NOTE — H&P (Signed)
[name] 04/14/2015 9:45 AM Location: [facility] Patient #: [id number] DOB: 22-May-1981 Married / Language: English / Race: White Female History of Present Illness Marcello Moores A. Seanmichael Salmons MD; 04/14/2015 11:33 AM) Patient words: abd wall mass   pt sent due to multiple lipoma of abdomen present for the last year. They are sore and getting larger. No redness or drainage.                    CLINICAL DATA: Soft tissue mass to the right of the umbilicus.  EXAM: LIMITED ABDOMINAL ULTRASOUND  COMPARISON: None.  FINDINGS: Scanning is limited to the area of palpable abnormality. The subcutaneous tissues only were imaged. Palpable abnormality corresponds to subcutaneous fat which appears normal in echogenicity. Palpable area corresponds to a fat lobule measuring 30 x 12 mm. This appears to be normal fatty tissue but could be a lipoma or normal subcutaneous fat.  No solid mass or fluid collection.  IMPRESSION: Palpable abnormality to the right of the umbilicus corresponds to normal appearing fat. This may be normal subcutaneous fat or a lipoma.   Electronically Signed By: Franchot Gallo M.D. On: 03/17/2015 09:25.  The patient is a 34 year old female   Other Problems Elbert Ewings, [place]; 04/14/2015 9:45 AM) Asthma Back Pain Hemorrhoids High blood pressure  Past Surgical History Elbert Ewings, CMA; 04/14/2015 9:45 AM) Cesarean Section - Multiple Gallbladder Surgery - Laparoscopic  Diagnostic Studies History Elbert Ewings, CMA; 04/14/2015 9:45 AM) Colonoscopy never Mammogram never Pap Smear 1-5 years ago  Allergies Elbert Ewings, CMA; 04/14/2015 9:46 AM) Penicillin V *PENICILLINS* BEE VENOM Anaphylaxis.  Medication History Elbert Ewings, CMA; 04/14/2015 9:47 AM) AmLODIPine Besylate (5MG  Tablet, Oral) Active. Methotrexate (2.5MG  Tablet, Oral) Active. Pantoprazole Sodium (40MG  Tablet DR, Oral) Active. Ibuprofen (600MG  Tablet, Oral)  Active. CycloSPORINE Modified (50MG  Capsule, Oral) Active. Medications Reconciled  Social History Elbert Ewings, [place]; 04/14/2015 9:45 AM) Alcohol use Remotely quit alcohol use. Caffeine use Carbonated beverages, Tea. No drug use Tobacco use Former smoker.  Family History Elbert Ewings, [place]; 04/14/2015 9:45 AM) Alcohol Abuse Family Members In General. Family history unknown First Degree Relatives  Pregnancy / Birth History Elbert Ewings, CMA; 04/14/2015 9:45 AM) Age at menarche 12 years. Gravida 8 Irregular periods Maternal age 13-20 Para 3     Review of Systems Elbert Ewings CMA; 04/14/2015 9:45 AM) General Present- Night Sweats and Weight Gain. Not Present- Appetite Loss, Chills, Fatigue, Fever and Weight Loss. Skin Not Present- Change in Wart/Mole, Dryness, Hives, Jaundice, New Lesions, Non-Healing Wounds, Rash and Ulcer. HEENT Present- Hoarseness and Nose Bleed. Not Present- Earache, Hearing Loss, Oral Ulcers, Ringing in the Ears, Seasonal Allergies, Sinus Pain, Sore Throat, Visual Disturbances, Wears glasses/contact lenses and Yellow Eyes. Respiratory Not Present- Bloody sputum, Chronic Cough, Difficulty Breathing, Snoring and Wheezing. Breast Not Present- Breast Mass, Breast Pain, Nipple Discharge and Skin Changes. Cardiovascular Present- Shortness of Breath and Swelling of Extremities. Not Present- Chest Pain, Difficulty Breathing Lying Down, Leg Cramps, Palpitations and Rapid Heart Rate. Female Genitourinary Not Present- Frequency, Nocturia, Painful Urination, Pelvic Pain and Urgency. Musculoskeletal Present- Back Pain. Not Present- Joint Pain, Joint Stiffness, Muscle Pain, Muscle Weakness and Swelling of Extremities. Neurological Present- Headaches. Not Present- Decreased Memory, Fainting, Numbness, Seizures, Tingling, Tremor, Trouble walking and Weakness. Psychiatric Not Present- Anxiety, Bipolar, Change in Sleep Pattern, Depression, Fearful and Frequent  crying. Endocrine Not Present- Cold Intolerance, Excessive Hunger, Hair Changes, Heat Intolerance, Hot flashes and New Diabetes. Hematology Not Present- Easy Bruising, Excessive bleeding,  Gland problems, HIV and Persistent Infections.  Vitals Elbert Ewings CMA; 04/14/2015 9:47 AM) 04/14/2015 9:47 AM Weight: 214 lb Height: 66in Body Surface Area: 2.13 m Body Mass Index: 34.54 kg/m Temp.: 98.40F(Oral)  Pulse: 70 (Regular)  BP: 128/76 (Sitting, Left Arm, Standard)     Physical Exam (Auther Lyerly A. Yash Cacciola MD; 04/14/2015 11:33 AM)  General Mental Status-Alert. General Appearance-Consistent with stated age. Hydration-Well hydrated. Voice-Normal.  Head and Neck Head-normocephalic, atraumatic with no lesions or palpable masses. Trachea-midline. Thyroid Gland Characteristics - normal size and consistency.  Chest and Lung Exam Chest and lung exam reveals -quiet, even and easy respiratory effort with no use of accessory muscles and on auscultation, normal breath sounds, no adventitious sounds and normal vocal resonance. Inspection Chest Wall - Normal. Back - normal.  Cardiovascular Cardiovascular examination reveals -normal heart sounds, regular rate and rhythm with no murmurs and normal pedal pulses bilaterally.  Abdomen Note: 3 LIPOMA 2 ON RIGHT AND ONE ON LEFT EACH 2 CM MOBILE SORE    Neurologic Neurologic evaluation reveals -alert and oriented x 3 with no impairment of recent or remote memory. Mental Status-Normal.  Musculoskeletal Normal Exam - Left-Upper Extremity Strength Normal and Lower Extremity Strength Normal. Normal Exam - Right-Upper Extremity Strength Normal and Lower Extremity Strength Normal.    Assessment & Plan (Shariyah Eland A. 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 Pt Education - Overview of benign lesions of the skin: discussed with patient and provided information. Pt Education - [facility] Free Text Education/Instructions: discussed with patient and provided information. The anatomy and the physiology was discussed. The pathophysiology and natural history of the disease was discussed. Options were discussed and recommendations were made. Technique, risks, benefits, & alternatives were discussed. Risks such as stroke, heart attack, bleeding, indection, death, and other risks discussed. Questions answered. The patient agrees to proceed.

## 2015-05-02 DIAGNOSIS — D171 Benign lipomatous neoplasm of skin and subcutaneous tissue of trunk: Secondary | ICD-10-CM

## 2015-05-02 HISTORY — DX: Benign lipomatous neoplasm of skin and subcutaneous tissue of trunk: D17.1

## 2015-05-04 ENCOUNTER — Other Ambulatory Visit: Payer: Self-pay | Admitting: Family Medicine

## 2015-05-12 ENCOUNTER — Encounter (HOSPITAL_BASED_OUTPATIENT_CLINIC_OR_DEPARTMENT_OTHER): Payer: Self-pay | Admitting: *Deleted

## 2015-05-12 NOTE — Pre-Procedure Instructions (Signed)
To come for BMET and EKG 

## 2015-05-14 ENCOUNTER — Other Ambulatory Visit: Payer: Self-pay

## 2015-05-14 ENCOUNTER — Encounter (HOSPITAL_BASED_OUTPATIENT_CLINIC_OR_DEPARTMENT_OTHER)
Admission: RE | Admit: 2015-05-14 | Discharge: 2015-05-14 | Disposition: A | Discharge status: Home / Self Care | Payer: Medicaid Other | Source: Ambulatory Visit | Attending: Surgery | Admitting: Surgery

## 2015-05-14 DIAGNOSIS — Z79899 Other long term (current) drug therapy: Secondary | ICD-10-CM | POA: Diagnosis not present

## 2015-05-14 DIAGNOSIS — D171 Benign lipomatous neoplasm of skin and subcutaneous tissue of trunk: Secondary | ICD-10-CM | POA: Diagnosis present

## 2015-05-14 DIAGNOSIS — Z91030 Bee allergy status: Secondary | ICD-10-CM | POA: Diagnosis not present

## 2015-05-14 DIAGNOSIS — Z87891 Personal history of nicotine dependence: Secondary | ICD-10-CM | POA: Diagnosis not present

## 2015-05-14 DIAGNOSIS — Z880 Allergy status to penicillin: Secondary | ICD-10-CM | POA: Diagnosis not present

## 2015-05-14 DIAGNOSIS — J45909 Unspecified asthma, uncomplicated: Secondary | ICD-10-CM | POA: Diagnosis not present

## 2015-05-14 DIAGNOSIS — Z01818 Encounter for other preprocedural examination: Secondary | ICD-10-CM | POA: Diagnosis present

## 2015-05-14 DIAGNOSIS — Z6834 Body mass index (BMI) 34.0-34.9, adult: Secondary | ICD-10-CM | POA: Diagnosis not present

## 2015-05-14 DIAGNOSIS — D214 Benign neoplasm of connective and other soft tissue of abdomen: Secondary | ICD-10-CM | POA: Diagnosis not present

## 2015-05-14 DIAGNOSIS — I10 Essential (primary) hypertension: Secondary | ICD-10-CM | POA: Diagnosis not present

## 2015-05-14 LAB — BASIC METABOLIC PANEL
Anion gap: 10 (ref 5–15)
BUN: 6 mg/dL (ref 6–20)
CO2: 22 mmol/L (ref 22–32)
Calcium: 9.1 mg/dL (ref 8.9–10.3)
Chloride: 108 mmol/L (ref 101–111)
Creatinine, Ser: 0.65 mg/dL (ref 0.44–1.00)
GFR calc Af Amer: >60 mL/min (ref >60)
GFR calc non Af Amer: >60 mL/min (ref >60)
Glucose, Bld: 120 mg/dL — ABNORMAL HIGH (ref 65–99)
Potassium: 4.1 mmol/L (ref 3.5–5.1)
Sodium: 140 mmol/L (ref 135–145)

## 2015-05-21 ENCOUNTER — Ambulatory Visit (HOSPITAL_BASED_OUTPATIENT_CLINIC_OR_DEPARTMENT_OTHER): Payer: Medicaid Other | Admitting: Anesthesiology

## 2015-05-21 ENCOUNTER — Ambulatory Visit (HOSPITAL_BASED_OUTPATIENT_CLINIC_OR_DEPARTMENT_OTHER)
Admission: RE | Admit: 2015-05-21 | Discharge: 2015-05-21 | Disposition: A | Discharge status: Home / Self Care | Payer: Medicaid Other | Source: Ambulatory Visit | Attending: Surgery | Admitting: Surgery

## 2015-05-21 ENCOUNTER — Encounter (HOSPITAL_BASED_OUTPATIENT_CLINIC_OR_DEPARTMENT_OTHER): Payer: Self-pay | Admitting: Anesthesiology

## 2015-05-21 ENCOUNTER — Encounter (HOSPITAL_BASED_OUTPATIENT_CLINIC_OR_DEPARTMENT_OTHER)
Admission: RE | Disposition: A | Discharge status: Home / Self Care | Payer: Self-pay | Source: Ambulatory Visit | Attending: Surgery

## 2015-05-21 DIAGNOSIS — Z79899 Other long term (current) drug therapy: Secondary | ICD-10-CM | POA: Insufficient documentation

## 2015-05-21 DIAGNOSIS — Z880 Allergy status to penicillin: Secondary | ICD-10-CM | POA: Insufficient documentation

## 2015-05-21 DIAGNOSIS — I10 Essential (primary) hypertension: Secondary | ICD-10-CM | POA: Insufficient documentation

## 2015-05-21 DIAGNOSIS — Z6834 Body mass index (BMI) 34.0-34.9, adult: Secondary | ICD-10-CM | POA: Insufficient documentation

## 2015-05-21 DIAGNOSIS — J45909 Unspecified asthma, uncomplicated: Secondary | ICD-10-CM | POA: Diagnosis not present

## 2015-05-21 DIAGNOSIS — D214 Benign neoplasm of connective and other soft tissue of abdomen: Secondary | ICD-10-CM | POA: Diagnosis not present

## 2015-05-21 DIAGNOSIS — Z91030 Bee allergy status: Secondary | ICD-10-CM | POA: Insufficient documentation

## 2015-05-21 DIAGNOSIS — Z87891 Personal history of nicotine dependence: Secondary | ICD-10-CM | POA: Insufficient documentation

## 2015-05-21 HISTORY — DX: Benign lipomatous neoplasm of skin and subcutaneous tissue of trunk: D17.1

## 2015-05-21 HISTORY — PX: LIPOMA EXCISION: SHX5283

## 2015-05-21 HISTORY — DX: Personal history of Methicillin resistant Staphylococcus aureus infection: Z86.14

## 2015-05-21 HISTORY — DX: Gastro-esophageal reflux disease without esophagitis: K21.9

## 2015-05-21 LAB — POCT HEMOGLOBIN-HEMACUE: Hemoglobin: 12.1 g/dL (ref 12.0–15.0)

## 2015-05-21 SURGERY — EXCISION LIPOMA
Anesthesia: General | Site: Abdomen

## 2015-05-21 MED ORDER — LACTATED RINGERS IV SOLN
INTRAVENOUS | Status: DC
  Administered 2015-05-21: 08:00:00 via INTRAVENOUS

## 2015-05-21 MED ORDER — FENTANYL CITRATE (PF) 100 MCG/2ML IJ SOLN
INTRAMUSCULAR | Status: AC
  Filled 2015-05-21: qty 6

## 2015-05-21 MED ORDER — ONDANSETRON HCL 4 MG/2ML IJ SOLN
INTRAMUSCULAR | Status: DC | PRN
  Administered 2015-05-21: 4 mg via INTRAVENOUS

## 2015-05-21 MED ORDER — HYDROCODONE-ACETAMINOPHEN 5-325 MG PO TABS: 1.0000 | ORAL_TABLET | Freq: Four times a day (QID) | ORAL | Status: DC | PRN

## 2015-05-21 MED ORDER — DEXAMETHASONE SODIUM PHOSPHATE 4 MG/ML IJ SOLN
INTRAMUSCULAR | Status: DC | PRN
  Administered 2015-05-21: 10 mg via INTRAVENOUS

## 2015-05-21 MED ORDER — HYDROMORPHONE HCL 1 MG/ML IJ SOLN: 0.2500 mg | INTRAMUSCULAR | Status: DC | PRN

## 2015-05-21 MED ORDER — SCOPOLAMINE 1 MG/3DAYS TD PT72: 1.0000 | MEDICATED_PATCH | Freq: Once | TRANSDERMAL | Status: DC | PRN

## 2015-05-21 MED ORDER — MIDAZOLAM HCL 2 MG/2ML IJ SOLN
1.0000 mg | INTRAMUSCULAR | Status: DC | PRN
Start: 2015-05-21 — End: 2015-05-21
  Administered 2015-05-21: 2 mg via INTRAVENOUS

## 2015-05-21 MED ORDER — MEPERIDINE HCL 25 MG/ML IJ SOLN: 6.2500 mg | INTRAMUSCULAR | Status: DC | PRN

## 2015-05-21 MED ORDER — PROPOFOL 10 MG/ML IV BOLUS
INTRAVENOUS | Status: DC | PRN
  Administered 2015-05-21: 200 mg via INTRAVENOUS

## 2015-05-21 MED ORDER — CIPROFLOXACIN IN D5W 400 MG/200ML IV SOLN
INTRAVENOUS | Status: AC
  Filled 2015-05-21: qty 200

## 2015-05-21 MED ORDER — GLYCOPYRROLATE 0.2 MG/ML IJ SOLN: 0.2000 mg | Freq: Once | INTRAMUSCULAR | Status: DC | PRN

## 2015-05-21 MED ORDER — BUPIVACAINE-EPINEPHRINE 0.5% -1:200000 IJ SOLN
INTRAMUSCULAR | Status: DC | PRN
  Administered 2015-05-21: 14 mL

## 2015-05-21 MED ORDER — CIPROFLOXACIN IN D5W 400 MG/200ML IV SOLN
400.0000 mg | INTRAVENOUS | Status: AC
  Administered 2015-05-21: 400 mg via INTRAVENOUS

## 2015-05-21 MED ORDER — MIDAZOLAM HCL 2 MG/2ML IJ SOLN
0.5000 mg | Freq: Once | INTRAMUSCULAR | Status: DC | PRN
Start: 2015-05-21 — End: 2015-05-21

## 2015-05-21 MED ORDER — BUPIVACAINE-EPINEPHRINE (PF) 0.5% -1:200000 IJ SOLN
INTRAMUSCULAR | Status: AC
  Filled 2015-05-21: qty 120

## 2015-05-21 MED ORDER — MIDAZOLAM HCL 2 MG/2ML IJ SOLN
INTRAMUSCULAR | Status: AC
  Filled 2015-05-21: qty 2

## 2015-05-21 MED ORDER — PROPOFOL 500 MG/50ML IV EMUL
INTRAVENOUS | Status: AC
  Filled 2015-05-21: qty 50

## 2015-05-21 MED ORDER — PROMETHAZINE HCL 25 MG/ML IJ SOLN: 6.2500 mg | INTRAMUSCULAR | Status: DC | PRN

## 2015-05-21 MED ORDER — FENTANYL CITRATE (PF) 100 MCG/2ML IJ SOLN
50.0000 ug | INTRAMUSCULAR | Status: DC | PRN
  Administered 2015-05-21: 100 ug via INTRAVENOUS
  Administered 2015-05-21: 50 ug via INTRAVENOUS

## 2015-05-21 MED ORDER — LIDOCAINE HCL (CARDIAC) 20 MG/ML IV SOLN
INTRAVENOUS | Status: DC | PRN
  Administered 2015-05-21: 20 mg via INTRAVENOUS

## 2015-05-21 SURGICAL SUPPLY — 39 items
BENZOIN TINCTURE PRP APPL 2/3 (GAUZE/BANDAGES/DRESSINGS) IMPLANT
BLADE SURG 10 STRL SS (BLADE) IMPLANT
BLADE SURG 15 STRL LF DISP TIS (BLADE) ×1 IMPLANT
BLADE SURG 15 STRL SS (BLADE) ×1
CANISTER SUCT 1200ML W/VALVE (MISCELLANEOUS) IMPLANT
CHLORAPREP W/TINT 26ML (MISCELLANEOUS) ×2 IMPLANT
COVER BACK TABLE 60X90IN (DRAPES) ×2 IMPLANT
COVER MAYO STAND STRL (DRAPES) ×2 IMPLANT
DECANTER SPIKE VIAL GLASS SM (MISCELLANEOUS) IMPLANT
DERMABOND ADVANCED (GAUZE/BANDAGES/DRESSINGS) ×1
DERMABOND ADVANCED .7 DNX12 (GAUZE/BANDAGES/DRESSINGS) ×1 IMPLANT
DRAPE LAPAROSCOPIC ABDOMINAL (DRAPES) ×2 IMPLANT
DRAPE LAPAROTOMY 100X72 PEDS (DRAPES) ×2 IMPLANT
DRAPE UTILITY XL STRL (DRAPES) ×2 IMPLANT
ELECT COATED BLADE 2.86 ST (ELECTRODE) ×2 IMPLANT
ELECT REM PT RETURN 9FT ADLT (ELECTROSURGICAL) ×2
ELECTRODE REM PT RTRN 9FT ADLT (ELECTROSURGICAL) ×1 IMPLANT
GLOVE BIOGEL PI IND STRL 8 (GLOVE) ×1 IMPLANT
GLOVE BIOGEL PI INDICATOR 8 (GLOVE) ×1
GLOVE ECLIPSE 8.0 STRL XLNG CF (GLOVE) ×2 IMPLANT
GOWN STRL REUS W/ TWL LRG LVL3 (GOWN DISPOSABLE) ×2 IMPLANT
GOWN STRL REUS W/TWL LRG LVL3 (GOWN DISPOSABLE) ×2
LIQUID BAND (GAUZE/BANDAGES/DRESSINGS) ×2 IMPLANT
NEEDLE HYPO 25X1 1.5 SAFETY (NEEDLE) ×2 IMPLANT
NS IRRIG 1000ML POUR BTL (IV SOLUTION) ×2 IMPLANT
PACK BASIN DAY SURGERY FS (CUSTOM PROCEDURE TRAY) ×2 IMPLANT
PENCIL BUTTON HOLSTER BLD 10FT (ELECTRODE) ×2 IMPLANT
SLEEVE SCD COMPRESS KNEE MED (MISCELLANEOUS) ×2 IMPLANT
SPONGE LAP 4X18 X RAY DECT (DISPOSABLE) ×2 IMPLANT
STAPLER VISISTAT 35W (STAPLE) IMPLANT
STRIP CLOSURE SKIN 1/2X4 (GAUZE/BANDAGES/DRESSINGS) IMPLANT
SUT MON AB 4-0 PC3 18 (SUTURE) ×2 IMPLANT
SUT VICRYL 3-0 CR8 SH (SUTURE) IMPLANT
SUT VICRYL AB 3 0 TIES (SUTURE) IMPLANT
SYR CONTROL 10ML LL (SYRINGE) ×2 IMPLANT
TOWEL OR 17X24 6PK STRL BLUE (TOWEL DISPOSABLE) ×4 IMPLANT
TOWEL OR NON WOVEN STRL DISP B (DISPOSABLE) ×2 IMPLANT
TUBE CONNECTING 20X1/4 (TUBING) IMPLANT
YANKAUER SUCT BULB TIP NO VENT (SUCTIONS) ×2 IMPLANT

## 2015-05-21 NOTE — Anesthesia Preprocedure Evaluation (Addendum)
Anesthesia Evaluation  Patient identified by MRN, date of birth, ID band Patient awake    Reviewed: Allergy & Precautions, NPO status , Patient's Chart, lab work & pertinent test results  History of Anesthesia Complications Negative for: history of anesthetic complications  Airway Mallampati: II  TM Distance: >3 FB Neck ROM: Full    Dental  (+) Dental Advisory Given, Chipped   Pulmonary neg pulmonary ROS, former smoker,  breath sounds clear to auscultation        Cardiovascular hypertension, Pt. on medications - anginaRhythm:Regular Rate:Normal     Neuro/Psych negative neurological ROS  negative psych ROS   GI/Hepatic Neg liver ROS, GERD-  Medicated and Controlled,  Endo/Other  Morbid obesity  Renal/GU negative Renal ROS     Musculoskeletal   Abdominal (+) + obese,   Peds  Hematology negative hematology ROS (+)   Anesthesia Other Findings   Reproductive/Obstetrics                            Anesthesia Physical Anesthesia Plan  ASA: II  Anesthesia Plan: General   Post-op Pain Management:    Induction: Intravenous  Airway Management Planned: LMA  Additional Equipment:   Intra-op Plan:   Post-operative Plan:   Informed Consent: I have reviewed the patients History and Physical, chart, labs and discussed the procedure including the risks, benefits and alternatives for the proposed anesthesia with the patient or authorized representative who has indicated his/her understanding and acceptance.   Dental advisory given  Plan Discussed with: CRNA and Surgeon  Anesthesia Plan Comments: (Plan routine monitors, GA- LMA ok)        Anesthesia Quick Evaluation

## 2015-05-21 NOTE — Discharge Instructions (Signed)
Post Anesthesia Home Care Instructions  Activity: Get plenty of rest for the remainder of the day. A responsible adult should stay with you for 24 hours following the procedure.  For the next 24 hours, DO NOT: -Drive a car -Paediatric nurse -Drink alcoholic beverages -Take any medication unless instructed by your physician -Make any legal decisions or sign important papers.  Meals: Start with liquid foods such as gelatin or soup. Progress to regular foods as tolerated. Avoid greasy, spicy, heavy foods. If nausea and/or vomiting occur, drink only clear liquids until the nausea and/or vomiting subsides. Call your physician if vomiting continues.  Special Instructions/Symptoms: Your throat may feel dry or sore from the anesthesia or the breathing tube placed in your throat during surgery. If this causes discomfort, gargle with warm salt water. The discomfort should disappear within 24 hours.  If you had a scopolamine patch placed behind your ear for the management of post- operative nausea and/or vomiting:  1. The medication in the patch is effective for 72 hours, after which it should be removed.  Wrap patch in a tissue and discard in the trash. Wash hands thoroughly with soap and water. 2. You may remove the patch earlier than 72 hours if you experience unpleasant side effects which may include dry mouth, dizziness or visual disturbances. 3. Avoid touching the patch. Wash your hands with soap and water after contact with the patch.   GENERAL SURGERY: POST OP INSTRUCTIONS  1. DIET: Follow a light bland diet the first 24 hours after arrival home, such as soup, liquids, crackers, etc.  Be sure to include lots of fluids daily.  Avoid fast food or heavy meals as your are more likely to get nauseated.   2. Take your usually prescribed home medications unless otherwise directed. 3. PAIN CONTROL: a. Pain is best controlled by a usual combination of three different methods  TOGETHER: i. Ice/Heat ii. Over the counter pain medication iii. Prescription pain medication b. Most patients will experience some swelling and bruising around the incisions.  Ice packs or heating pads (30-60 minutes up to 6 times a day) will help. Use ice for the first few days to help decrease swelling and bruising, then switch to heat to help relax tight/sore spots and speed recovery.  Some people prefer to use ice alone, heat alone, alternating between ice & heat.  Experiment to what works for you.  Swelling and bruising can take several weeks to resolve.   c. It is helpful to take an over-the-counter pain medication regularly for the first few weeks.  Choose one of the following that works best for you: i. Naproxen (Aleve, etc)  Two 220mg  tabs twice a day ii. Ibuprofen (Advil, etc) Three 200mg  tabs four times a day (every meal & bedtime) iii. Acetaminophen (Tylenol, etc) 500-650mg  four times a day (every meal & bedtime) d. A  prescription for pain medication (such as oxycodone, hydrocodone, etc) should be given to you upon discharge.  Take your pain medication as prescribed.  i. If you are having problems/concerns with the prescription medicine (does not control pain, nausea, vomiting, rash, itching, etc), please call us [phone] to see if we need to switch you to a different pain medicine that will work better for you and/or control your side effect better. ii. If you need a refill on your pain medication, please contact your pharmacy.  They will contact our office to request authorization. Prescriptions will not be filled after 5 pm or on week-ends. 4.  Avoid getting constipated.  Between the surgery and the pain medications, it is common to experience some constipation.  Increasing fluid intake and taking a fiber supplement (such as Metamucil, Citrucel, FiberCon, MiraLax, etc) 1-2 times a day regularly will usually help prevent this problem from occurring.  A mild laxative (prune juice, Milk  of Magnesia, MiraLax, etc) should be taken according to package directions if there are no bowel movements after 48 hours.   5. Wash / shower every day.  You may shower over the dressings as they are waterproof.  Continue to shower over incision(s) after the dressing is off. 6. Remove your waterproof bandages 5 days after surgery.  You may leave the incision open to air.  You may have skin tapes (Steri Strips) covering the incision(s).  Leave them on until one week, then remove.  You may replace a dressing/Band-Aid to cover the incision for comfort if you wish.      7. ACTIVITIES as tolerated:   a. You may resume regular (light) daily activities beginning the next day--such as daily self-care, walking, climbing stairs--gradually increasing activities as tolerated.  If you can walk 30 minutes without difficulty, it is safe to try more intense activity such as jogging, treadmill, bicycling, low-impact aerobics, swimming, etc. b. Save the most intensive and strenuous activity for last such as sit-ups, heavy lifting, contact sports, etc  Refrain from any heavy lifting or straining until you are off narcotics for pain control.   c. DO NOT PUSH THROUGH PAIN.  Let pain be your guide: If it hurts to do something, don't do it.  Pain is your body warning you to avoid that activity for another week until the pain goes down. d. You may drive when you are no longer taking prescription pain medication, you can comfortably wear a seatbelt, and you can safely maneuver your car and apply brakes. e. Dennis Bast may have sexual intercourse when it is comfortable.  8. FOLLOW UP in our office a. Please call [facility] at [phone] to set up an appointment to see your surgeon in the office for a follow-up appointment approximately 2-3 weeks after your surgery. b. Make sure that you call for this appointment the day you arrive home to insure a convenient appointment time. 9. IF YOU HAVE DISABILITY OR FAMILY LEAVE FORMS, BRING THEM  TO THE OFFICE FOR PROCESSING.  DO NOT GIVE THEM TO YOUR DOCTOR.   WHEN TO CALL us [phone]: 1. Poor pain control 2. Reactions / problems with new medications (rash/itching, nausea, etc)  3. Fever over 101.5 F (38.5 C) 4. Worsening swelling or bruising 5. Continued bleeding from incision. 6. Increased pain, redness, or drainage from the incision 7. Difficulty breathing / swallowing   The clinic staff is available to answer your questions during regular business hours (8:30am-5pm).  Please dont hesitate to call and ask to speak to one of our nurses for clinical concerns.   If you have a medical emergency, go to the nearest emergency room or call 911.  A surgeon from [facility] is always on call at the [facility], [place], [place], [place], [place]  [address] ? MAIN: [phone] ? TOLL FREE: [phone] ?  FAX [phone] [email or web address]

## 2015-05-21 NOTE — H&P (Signed)
H&P   [name] (MR# [id number])      H&P Info    Author Note Status Last Update User Last Update Date/Time   Erroll Luna, MD Signed Erroll Luna, MD 04/14/2015 11:35 AM    H&P    Expand All Collapse All   [name] 04/14/2015 9:45 AM Location: [facility] Patient #: [id number] DOB: 13-Aug-1981 Married / Language: English / Race: White Female History of Present Illness Marcello Moores A. Peng Thorstenson MD; 04/14/2015 11:33 AM) Patient words: abd wall mass   pt sent due to multiple lipoma of abdomen present for the last year. They are sore and getting larger. No redness or drainage.                    CLINICAL DATA: Soft tissue mass to the right of the umbilicus.  EXAM: LIMITED ABDOMINAL ULTRASOUND  COMPARISON: None.  FINDINGS: Scanning is limited to the area of palpable abnormality. The subcutaneous tissues only were imaged. Palpable abnormality corresponds to subcutaneous fat which appears normal in echogenicity. Palpable area corresponds to a fat lobule measuring 30 x 12 mm. This appears to be normal fatty tissue but could be a lipoma or normal subcutaneous fat.  No solid mass or fluid collection.  IMPRESSION: Palpable abnormality to the right of the umbilicus corresponds to normal appearing fat. This may be normal subcutaneous fat or a lipoma.   Electronically Signed By: Franchot Gallo M.D. On: 03/17/2015 09:25.  The patient is a 34 year old female   Other Problems Elbert Ewings, [place]; 04/14/2015 9:45 AM) Asthma Back Pain Hemorrhoids High blood pressure  Past Surgical History Elbert Ewings, CMA; 04/14/2015 9:45 AM) Cesarean Section - Multiple Gallbladder Surgery - Laparoscopic  Diagnostic Studies History Elbert Ewings, CMA; 04/14/2015 9:45 AM) Colonoscopy never Mammogram never Pap Smear 1-5 years ago  Allergies Elbert Ewings, CMA; 04/14/2015 9:46 AM) Penicillin V *PENICILLINS* BEE VENOM  Anaphylaxis.  Medication History Elbert Ewings, CMA; 04/14/2015 9:47 AM) AmLODIPine Besylate (5MG  Tablet, Oral) Active. Methotrexate (2.5MG  Tablet, Oral) Active. Pantoprazole Sodium (40MG  Tablet DR, Oral) Active. Ibuprofen (600MG  Tablet, Oral) Active. CycloSPORINE Modified (50MG  Capsule, Oral) Active. Medications Reconciled  Social History Elbert Ewings, [place]; 04/14/2015 9:45 AM) Alcohol use Remotely quit alcohol use. Caffeine use Carbonated beverages, Tea. No drug use Tobacco use Former smoker.  Family History Elbert Ewings, [place]; 04/14/2015 9:45 AM) Alcohol Abuse Family Members In General. Family history unknown First Degree Relatives  Pregnancy / Birth History Elbert Ewings, CMA; 04/14/2015 9:45 AM) Age at menarche 40 years. Gravida 8 Irregular periods Maternal age 17-20 Para 3     Review of Systems Elbert Ewings CMA; 04/14/2015 9:45 AM) General Present- Night Sweats and Weight Gain. Not Present- Appetite Loss, Chills, Fatigue, Fever and Weight Loss. Skin Not Present- Change in Wart/Mole, Dryness, Hives, Jaundice, New Lesions, Non-Healing Wounds, Rash and Ulcer. HEENT Present- Hoarseness and Nose Bleed. Not Present- Earache, Hearing Loss, Oral Ulcers, Ringing in the Ears, Seasonal Allergies, Sinus Pain, Sore Throat, Visual Disturbances, Wears glasses/contact lenses and Yellow Eyes. Respiratory Not Present- Bloody sputum, Chronic Cough, Difficulty Breathing, Snoring and Wheezing. Breast Not Present- Breast Mass, Breast Pain, Nipple Discharge and Skin Changes. Cardiovascular Present- Shortness of Breath and Swelling of Extremities. Not Present- Chest Pain, Difficulty Breathing Lying Down, Leg Cramps, Palpitations and Rapid Heart Rate. Female Genitourinary Not Present- Frequency, Nocturia, Painful Urination, Pelvic Pain and Urgency. Musculoskeletal Present- Back Pain. Not Present- Joint Pain, Joint Stiffness, Muscle Pain, Muscle Weakness and Swelling of  Extremities. Neurological Present-  Headaches. Not Present- Decreased Memory, Fainting, Numbness, Seizures, Tingling, Tremor, Trouble walking and Weakness. Psychiatric Not Present- Anxiety, Bipolar, Change in Sleep Pattern, Depression, Fearful and Frequent crying. Endocrine Not Present- Cold Intolerance, Excessive Hunger, Hair Changes, Heat Intolerance, Hot flashes and New Diabetes. Hematology Not Present- Easy Bruising, Excessive bleeding, Gland problems, HIV and Persistent Infections.  Vitals Elbert Ewings CMA; 04/14/2015 9:47 AM) 04/14/2015 9:47 AM Weight: 214 lb Height: 66in Body Surface Area: 2.13 m Body Mass Index: 34.54 kg/m Temp.: 98.84F(Oral)  Pulse: 70 (Regular)  BP: 128/76 (Sitting, Left Arm, Standard)     Physical Exam (Synethia Endicott A. Charistopher Rumble MD; 04/14/2015 11:33 AM)  General Mental Status-Alert. General Appearance-Consistent with stated age. Hydration-Well hydrated. Voice-Normal.  Head and Neck Head-normocephalic, atraumatic with no lesions or palpable masses. Trachea-midline. Thyroid Gland Characteristics - normal size and consistency.  Chest and Lung Exam Chest and lung exam reveals -quiet, even and easy respiratory effort with no use of accessory muscles and on auscultation, normal breath sounds, no adventitious sounds and normal vocal resonance. Inspection Chest Wall - Normal. Back - normal.  Cardiovascular Cardiovascular examination reveals -normal heart sounds, regular rate and rhythm with no murmurs and normal pedal pulses bilaterally.  Abdomen Note: 3 LIPOMA 2 ON RIGHT AND ONE ON LEFT EACH 2 CM MOBILE SORE    Neurologic Neurologic evaluation reveals -alert and oriented x 3 with no impairment of recent or remote memory. Mental Status-Normal.  Musculoskeletal Normal Exam - Left-Upper Extremity Strength Normal and Lower Extremity Strength Normal. Normal Exam - Right-Upper Extremity Strength Normal and Lower Extremity  Strength Normal.    Assessment & Plan (Steffie Waggoner A. 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 Pt Education - Overview of benign lesions of the skin: discussed with patient and provided information. Pt Education - CCS Free Text Education/Instructions: discussed with patient and provided information. The anatomy and the physiology was discussed. The pathophysiology and natural history of the disease was discussed. Options were discussed and recommendations were made. Technique, risks, benefits, & alternatives were discussed. Risks such as stroke, heart attack, bleeding, indection, death, and other risks discussed. Questions answered. The patient agrees to proceed.

## 2015-05-21 NOTE — Anesthesia Postprocedure Evaluation (Signed)
  Anesthesia Post-op Note  Patient: [name]  Procedure(s) Performed: Procedure(s): EXCISION LIPOMAS ON ABDOMEN (N/A)  Patient Location: PACU  Anesthesia Type:General  Level of Consciousness: awake, alert , oriented and patient cooperative  Airway and Oxygen Therapy: Patient Spontanous Breathing  Post-op Pain: none  Post-op Assessment: Post-op Vital signs reviewed, Patient's Cardiovascular Status Stable, Respiratory Function Stable, Patent Airway, No signs of Nausea or vomiting and Pain level controlled              Post-op Vital Signs: Reviewed and stable  Last Vitals:  Filed Vitals:   05/21/15 1014  BP: 138/81  Pulse: 70  Temp: 36.6 C  Resp: 16    Complications: No apparent anesthesia complications

## 2015-05-21 NOTE — Transfer of Care (Signed)
Immediate Anesthesia Transfer of Care Note  Patient: [name]  Procedure(s) Performed: Procedure(s): EXCISION LIPOMAS ON ABDOMEN (N/A)  Patient Location: PACU  Anesthesia Type:General  Level of Consciousness: awake, alert  and oriented  Airway & Oxygen Therapy: Patient Spontanous Breathing and Patient connected to face mask oxygen  Post-op Assessment: Report given to RN and Post -op Vital signs reviewed and stable  Post vital signs: Reviewed and stable  Last Vitals:  Filed Vitals:   05/21/15 0915  BP: 127/63  Pulse: 107  Temp:   Resp: 14    Complications: No apparent anesthesia complications

## 2015-05-21 NOTE — Anesthesia Procedure Notes (Signed)
Procedure Name: LMA Insertion Date/Time: 05/21/2015 8:35 AM Performed by: Maryella Shivers Pre-anesthesia Checklist: Patient identified, Emergency Drugs available, Suction available and Patient being monitored Patient Re-evaluated:Patient Re-evaluated prior to inductionOxygen Delivery Method: Circle System Utilized Preoxygenation: Pre-oxygenation with 100% oxygen Intubation Type: IV induction Ventilation: Mask ventilation without difficulty LMA: LMA inserted LMA Size: 4.0 Number of attempts: 1 Airway Equipment and Method: Bite block Placement Confirmation: positive ETCO2 Tube secured with: Tape Dental Injury: Teeth and Oropharynx as per pre-operative assessment

## 2015-05-21 NOTE — Interval H&P Note (Signed)
History and Physical Interval Note:  05/21/2015 7:52 AM  [name]  has presented today for surgery, with the diagnosis of Abdominal Lipoma  The various methods of treatment have been discussed with the patient and family. After consideration of risks, benefits and other options for treatment, the patient has consented to  Procedure(s): EXCISION LIPOMAS ON ABDOMEN (N/A) as a surgical intervention .  The patient's history has been reviewed, patient examined, no change in status, stable for surgery.  I have reviewed the patient's chart and labs.  Questions were answered to the patient's satisfaction.     Natalio Salois A.

## 2015-05-21 NOTE — Op Note (Signed)
Preoperative diagnosis: Abdominal wall lipoma two on  the right one on  the left  Postoperative diagnosis: Same  Procedure: Excision of lipoma abdominal wall 3  Surgeon: Erroll Luna M.D.  Anesthesia: LMA with 0.25% Sensorcaine local  EBL: Minimal  Specimen: Lipoma  Drains: None  Indications for procedure: The patient presents for excision of 3 painful lipoma enter subcutaneous fat tissue of the abdominal wall.The procedure has been discussed with the patient.  Alternative therapies have been discussed with the patient.  Operative risks include bleeding,  Infection,  Organ injury,  Nerve injury,  Blood vessel injury,  DVT,  Pulmonary embolism,  Death,  And possible reoperation.  Medical management risks include worsening of present situation.  The success of the procedure is 50 -90 % at treating patients symptoms.  The patient understands and agrees to proceed.  Description of procedure: Patient met in holding area and all 3 lipoma were marked with the assistance of the patient. She is in taken back to the operating room placed upon the OR table where LMA anesthesia was initiated. The abdomen was prepped and draped in a sterile fashion and timeout was done. She received Cipro 4 mg IV. The right side of lipoma were addressed first. Location was in the right central abdomen. 3 cm incision made over the lipoma. There were 2 lipoma that were connected. These really subcutaneous fatty tissue. Dissection was carried down using cautery knees were excised. Hemostasis achieved and wound closed with 3-0 Vicryl and 4-0 Monocryl. In a similar fashion the left-sided lipoma which is located in the left upper quadrant was addressed. A 3 cm incision was made transversely the skin and dissection was carried down the lipoma was removed in its entirety. This lipoma was approximately 3 cm in diameter grossly. The right cell lipoma was approximately 3-4 cm in maximal diameter. Wound closed with 3-0 Vicryl for  Monocryl. Dermabond applied. All final counts sponge, needle origins found to be correct this portion case. Patient awoke taken recovery in satisfactory condition.

## 2015-05-22 ENCOUNTER — Encounter (HOSPITAL_BASED_OUTPATIENT_CLINIC_OR_DEPARTMENT_OTHER): Payer: Self-pay | Admitting: Surgery

## 2015-07-08 ENCOUNTER — Emergency Department (HOSPITAL_COMMUNITY): Payer: Medicaid Other

## 2015-07-08 ENCOUNTER — Encounter (HOSPITAL_COMMUNITY): Payer: Self-pay | Admitting: Emergency Medicine

## 2015-07-08 ENCOUNTER — Emergency Department (HOSPITAL_COMMUNITY)
Admission: EM | Admit: 2015-07-08 | Discharge: 2015-07-08 | Disposition: A | Discharge status: Home / Self Care | Payer: Medicaid Other | Attending: Emergency Medicine | Admitting: Emergency Medicine

## 2015-07-08 DIAGNOSIS — M62830 Muscle spasm of back: Secondary | ICD-10-CM | POA: Diagnosis not present

## 2015-07-08 DIAGNOSIS — Z87891 Personal history of nicotine dependence: Secondary | ICD-10-CM | POA: Insufficient documentation

## 2015-07-08 DIAGNOSIS — Z79899 Other long term (current) drug therapy: Secondary | ICD-10-CM | POA: Insufficient documentation

## 2015-07-08 DIAGNOSIS — R112 Nausea with vomiting, unspecified: Secondary | ICD-10-CM | POA: Insufficient documentation

## 2015-07-08 DIAGNOSIS — Z880 Allergy status to penicillin: Secondary | ICD-10-CM | POA: Diagnosis not present

## 2015-07-08 DIAGNOSIS — R51 Headache: Secondary | ICD-10-CM | POA: Diagnosis not present

## 2015-07-08 DIAGNOSIS — R109 Unspecified abdominal pain: Secondary | ICD-10-CM

## 2015-07-08 DIAGNOSIS — Z8614 Personal history of Methicillin resistant Staphylococcus aureus infection: Secondary | ICD-10-CM | POA: Insufficient documentation

## 2015-07-08 DIAGNOSIS — K219 Gastro-esophageal reflux disease without esophagitis: Secondary | ICD-10-CM | POA: Diagnosis not present

## 2015-07-08 DIAGNOSIS — M47896 Other spondylosis, lumbar region: Secondary | ICD-10-CM | POA: Insufficient documentation

## 2015-07-08 DIAGNOSIS — I10 Essential (primary) hypertension: Secondary | ICD-10-CM | POA: Insufficient documentation

## 2015-07-08 DIAGNOSIS — Z3202 Encounter for pregnancy test, result negative: Secondary | ICD-10-CM | POA: Insufficient documentation

## 2015-07-08 DIAGNOSIS — M1990 Unspecified osteoarthritis, unspecified site: Secondary | ICD-10-CM | POA: Insufficient documentation

## 2015-07-08 DIAGNOSIS — M545 Low back pain: Secondary | ICD-10-CM | POA: Diagnosis present

## 2015-07-08 DIAGNOSIS — M5442 Lumbago with sciatica, left side: Secondary | ICD-10-CM | POA: Insufficient documentation

## 2015-07-08 DIAGNOSIS — M47816 Spondylosis without myelopathy or radiculopathy, lumbar region: Secondary | ICD-10-CM

## 2015-07-08 LAB — COMPREHENSIVE METABOLIC PANEL
ALT: 40 U/L (ref 14–54)
AST: 73 U/L — ABNORMAL HIGH (ref 15–41)
Albumin: 4.1 g/dL (ref 3.5–5.0)
Alkaline Phosphatase: 54 U/L (ref 38–126)
Anion gap: 8 (ref 5–15)
BUN: 7 mg/dL (ref 6–20)
CO2: 21 mmol/L — ABNORMAL LOW (ref 22–32)
Calcium: 8.9 mg/dL (ref 8.9–10.3)
Chloride: 107 mmol/L (ref 101–111)
Creatinine, Ser: 0.59 mg/dL (ref 0.44–1.00)
GFR calc Af Amer: >60 mL/min (ref >60)
GFR calc non Af Amer: >60 mL/min (ref >60)
Glucose, Bld: 120 mg/dL — ABNORMAL HIGH (ref 65–99)
Potassium: 4 mmol/L (ref 3.5–5.1)
Sodium: 136 mmol/L (ref 135–145)
Total Bilirubin: 0.6 mg/dL (ref 0.3–1.2)
Total Protein: 7.5 g/dL (ref 6.5–8.1)

## 2015-07-08 LAB — LIPASE, BLOOD: Lipase: 19 U/L — ABNORMAL LOW (ref 22–51)

## 2015-07-08 LAB — CBC WITH DIFFERENTIAL/PLATELET
Basophils Absolute: 0.1 10*3/uL (ref 0.0–0.1)
Basophils Relative: 1 % (ref 0–1)
Eosinophils Absolute: 1 10*3/uL — ABNORMAL HIGH (ref 0.0–0.7)
Eosinophils Relative: 11 % — ABNORMAL HIGH (ref 0–5)
HCT: 38 % (ref 36.0–46.0)
Hemoglobin: 12.4 g/dL (ref 12.0–15.0)
Lymphocytes Relative: 24 % (ref 12–46)
Lymphs Abs: 2.2 10*3/uL (ref 0.7–4.0)
MCH: 26.6 pg (ref 26.0–34.0)
MCHC: 32.6 g/dL (ref 30.0–36.0)
MCV: 81.4 fL (ref 78.0–100.0)
Monocytes Absolute: 0.4 10*3/uL (ref 0.1–1.0)
Monocytes Relative: 5 % (ref 3–12)
Neutro Abs: 5.4 10*3/uL (ref 1.7–7.7)
Neutrophils Relative %: 59 % (ref 43–77)
Platelets: 281 10*3/uL (ref 150–400)
RBC: 4.67 MIL/uL (ref 3.87–5.11)
RDW: 15.6 % — ABNORMAL HIGH (ref 11.5–15.5)
WBC: 9.2 10*3/uL (ref 4.0–10.5)

## 2015-07-08 LAB — URINALYSIS, ROUTINE W REFLEX MICROSCOPIC
Bilirubin Urine: NEGATIVE
Glucose, UA: NEGATIVE mg/dL
Hgb urine dipstick: NEGATIVE
Ketones, ur: NEGATIVE mg/dL
Leukocytes, UA: NEGATIVE
Nitrite: NEGATIVE
Protein, ur: NEGATIVE mg/dL
Specific Gravity, Urine: 1.022 (ref 1.005–1.030)
Urobilinogen, UA: 0.2 mg/dL (ref 0.0–1.0)
pH: 5 (ref 5.0–8.0)

## 2015-07-08 LAB — POC URINE PREG, ED: Preg Test, Ur: NEGATIVE

## 2015-07-08 MED ORDER — HYDROCODONE-ACETAMINOPHEN 5-325 MG PO TABS
1.0000 | ORAL_TABLET | Freq: Once | ORAL | Status: AC
  Administered 2015-07-08: 1 via ORAL
  Filled 2015-07-08: qty 1

## 2015-07-08 MED ORDER — CYCLOBENZAPRINE HCL 10 MG PO TABS: 10.0000 mg | ORAL_TABLET | Freq: Three times a day (TID) | ORAL | Status: DC | PRN

## 2015-07-08 MED ORDER — MORPHINE SULFATE (PF) 4 MG/ML IV SOLN
4.0000 mg | Freq: Once | INTRAVENOUS | Status: AC
  Administered 2015-07-08: 4 mg via INTRAVENOUS
  Filled 2015-07-08: qty 1

## 2015-07-08 MED ORDER — SODIUM CHLORIDE 0.9 % IV BOLUS (SEPSIS)
1000.0000 mL | Freq: Once | INTRAVENOUS | Status: AC
  Administered 2015-07-08: 1000 mL via INTRAVENOUS

## 2015-07-08 MED ORDER — PREDNISONE 20 MG PO TABS: ORAL_TABLET | ORAL | Status: DC

## 2015-07-08 MED ORDER — ONDANSETRON HCL 4 MG/2ML IJ SOLN
4.0000 mg | Freq: Once | INTRAMUSCULAR | Status: AC
  Administered 2015-07-08: 4 mg via INTRAVENOUS
  Filled 2015-07-08: qty 2

## 2015-07-08 MED ORDER — NAPROXEN 500 MG PO TABS: 500.0000 mg | ORAL_TABLET | Freq: Two times a day (BID) | ORAL | Status: DC | PRN

## 2015-07-08 MED ORDER — HYDROCODONE-ACETAMINOPHEN 5-325 MG PO TABS: 1.0000 | ORAL_TABLET | Freq: Four times a day (QID) | ORAL | Status: DC | PRN

## 2015-07-08 MED ORDER — PREDNISONE 20 MG PO TABS
60.0000 mg | ORAL_TABLET | Freq: Once | ORAL | Status: AC
  Administered 2015-07-08: 60 mg via ORAL
  Filled 2015-07-08: qty 3

## 2015-07-08 MED ORDER — ONDANSETRON HCL 8 MG PO TABS: 8.0000 mg | ORAL_TABLET | Freq: Three times a day (TID) | ORAL | Status: DC | PRN

## 2015-07-08 NOTE — ED Notes (Signed)
Patient states severe lower back pain and lower abdominal pain.   Patient states some nausea and vomiting last night.   Denies other symptoms.

## 2015-07-08 NOTE — Discharge Instructions (Signed)
Follow the instructions below for your back pain. For your nausea, use zofran. Stay well hydrated with small sips of water throughout the day. Follow up with your regular doctor in 1 week for recheck. Return to the ER for changes or worsening symptoms.  Back Pain: Your back pain should be treated with medicines such as ibuprofen or aleve and this back pain should get better over the next 2 weeks.  However if you develop severe or worsening pain, low back pain with fever, numbness, weakness or inability to walk or urinate, you should return to the ER immediately.  Please follow up with your doctor this week for a recheck if still having symptoms. Avoid heavy lifting over 10 pounds for 2 weeks.  Low back pain is discomfort in the lower back that may be due to injuries to muscles and ligaments around the spine.  Occasionally, it may be caused by a a problem to a part of the spine called a disc.  The pain may last several days or a week;  However, most patients get completely well in 4 weeks.  Self - care:  The application of heat can help soothe the pain.  Maintaining your daily activities, including walking, is encourged, as it will help you get better faster than just staying in bed. Perform gentle stretching as discussed. Drink plenty of fluids.  Medications are also useful to help with pain control.  A commonly prescribed medication includes norco.  Do not drive or operate heavy machinery while taking this medication.  Non steroidal anti inflammatory medications including Ibuprofen and naproxen;  These medications help both pain and swelling and are very useful in treating back pain.  They should be taken with food, as they can cause stomach upset, and more seriously, stomach bleeding.    Muscle relaxants:  These medications can help with muscle tightness that is a cause of lower back pain.  Most of these medications can cause drowsiness, and it is not safe to drive or use dangerous machinery while  taking them.  Steroids (prednisone): these help with the radiating symptoms in your leg, they are antiinflammatories. Take with breakfast.   SEEK IMMEDIATE MEDICAL ATTENTION IF: New numbness, tingling, weakness, or problem with the use of your arms or legs.  Severe back pain not relieved with medications.  Difficulty with or loss of control of your bowel or bladder control.  Increasing pain in any areas of the body (such as chest or abdominal pain).  Shortness of breath, dizziness or fainting.  Nausea (feeling sick to your stomach), vomiting, fever, or sweats.  You will need to follow up with  Your primary healthcare provider in 1-2 weeks for reassessment.   Back Pain, Adult Back pain is very common. The pain often gets better over time. The cause of back pain is usually not dangerous. Most people can learn to manage their back pain on their own.  HOME CARE   Stay active. Start with short walks on flat ground if you can. Try to walk farther each day.  Do not sit, drive, or stand in one place for more than 30 minutes. Do not stay in bed.  Do not avoid exercise or work. Activity can help your back heal faster.  Be careful when you bend or lift an object. Bend at your knees, keep the object close to you, and do not twist.  Sleep on a firm mattress. Lie on your side, and bend your knees. If you lie on your back,  put a pillow under your knees.  Only take medicines as told by your doctor.  Put ice on the injured area.  Put ice in a plastic bag.  Place a towel between your skin and the bag.  Leave the ice on for 15-20 minutes, 03-04 times a day for the first 2 to 3 days. After that, you can switch between ice and heat packs.  Ask your doctor about back exercises or massage.  Avoid feeling anxious or stressed. Find good ways to deal with stress, such as exercise. GET HELP RIGHT AWAY IF:   Your pain does not go away with rest or medicine.  Your pain does not go away in 1  week.  You have new problems.  You do not feel well.  The pain spreads into your legs.  You cannot control when you poop (bowel movement) or pee (urinate).  Your arms or legs feel weak or lose feeling (numbness).  You feel sick to your stomach (nauseous) or throw up (vomit).  You have belly (abdominal) pain.  You feel like you may pass out (faint). MAKE SURE YOU:   Understand these instructions.  Will watch your condition.  Will get help right away if you are not doing well or get worse. Document Released: 04/05/2008 Document Revised: 01/10/2012 Document Reviewed: 02/19/2014 [facility] Patient Information 2015 [place], [place]. This information is not intended to replace advice given to you by your health care provider. Make sure you discuss any questions you have with your health care provider.  Back Exercises Back exercises help treat and prevent back injuries. The goal is to increase your strength in your belly (abdominal) and back muscles. These exercises can also help with flexibility. Start these exercises when told by your doctor. HOME CARE Back exercises include: Pelvic Tilt.  Lie on your back with your knees bent. Tilt your pelvis until the lower part of your back is against the floor. Hold this position 5 to 10 sec. Repeat this exercise 5 to 10 times. Knee to Chest.  Pull 1 knee up against your chest and hold for 20 to 30 seconds. Repeat this with the other knee. This may be done with the other leg straight or bent, whichever feels better. Then, pull both knees up against your chest. Sit-Ups or Curl-Ups.  Bend your knees 90 degrees. Start with tilting your pelvis, and do a partial, slow sit-up. Only lift your upper half 30 to 45 degrees off the floor. Take at least 2 to 3 seonds for each sit-up. Do not do sit-ups with your knees out straight. If partial sit-ups are difficult, simply do the above but with only tightening your belly (abdominal) muscles and holding it as  told. Hip-Lift.  Lie on your back with your knees flexed 90 degrees. Push down with your feet and shoulders as you raise your hips 2 inches off the floor. Hold for 10 seconds, repeat 5 to 10 times. Back Arches.  Lie on your stomach. Prop yourself up on bent elbows. Slowly press on your hands, causing an arch in your low back. Repeat 3 to 5 times. Shoulder-Lifts.  Lie face down with arms beside your body. Keep hips and belly pressed to floor as you slowly lift your head and shoulders off the floor. Do not overdo your exercises. Be careful in the beginning. Exercises may cause you some mild back discomfort. If the pain lasts for more than 15 minutes, stop the exercises until you see your doctor. Improvement with exercise for  back problems is slow.  Document Released: 11/20/2010 Document Revised: 01/10/2012 Document Reviewed: 08/19/2011 [facility] Patient Information 2015 [place], [place]. This information is not intended to replace advice given to you by your health care provider. Make sure you discuss any questions you have with your health care provider.  Heat Therapy Heat therapy can help ease sore, stiff, injured, and tight muscles and joints. Heat relaxes your muscles, which may help ease your pain.  RISKS AND COMPLICATIONS If you have any of the following conditions, do not use heat therapy unless your health care provider has approved:  Poor circulation.  Healing wounds or scarred skin in the area being treated.  Diabetes, heart disease, or high blood pressure.  Not being able to feel (numbness) the area being treated.  Unusual swelling of the area being treated.  Active infections.  Blood clots.  Cancer.  Inability to communicate pain. This may include young children and people who have problems with their brain function (dementia).  Pregnancy. Heat therapy should only be used on old, pre-existing, or long-lasting (chronic) injuries. Do not use heat therapy on new injuries  unless directed by your health care provider. HOW TO USE HEAT THERAPY There are several different kinds of heat therapy, including:  Moist heat pack.  Warm water bath.  Hot water bottle.  Electric heating pad.  Heated gel pack.  Heated wrap.  Electric heating pad. Use the heat therapy method suggested by your health care provider. Follow your health care provider's instructions on when and how to use heat therapy. GENERAL HEAT THERAPY RECOMMENDATIONS  Do not sleep while using heat therapy. Only use heat therapy while you are awake.  Your skin may turn pink while using heat therapy. Do not use heat therapy if your skin turns red.  Do not use heat therapy if you have new pain.  High heat or long exposure to heat can cause burns. Be careful when using heat therapy to avoid burning your skin.  Do not use heat therapy on areas of your skin that are already irritated, such as with a rash or sunburn. SEEK MEDICAL CARE IF:  You have blisters, redness, swelling, or numbness.  You have new pain.  Your pain is worse. MAKE SURE YOU:  Understand these instructions.  Will watch your condition.  Will get help right away if you are not doing well or get worse. Document Released: 01/10/2012 Document Revised: 03/04/2014 Document Reviewed: 12/11/2013 [facility] Patient Information 2015 [place], [place]. This information is not intended to replace advice given to you by your health care provider. Make sure you discuss any questions you have with your health care provider.  Muscle Cramps and Spasms Muscle cramps and spasms are when muscles tighten by themselves. They usually get better within minutes. Muscle cramps are painful. They are usually stronger and last longer than muscle spasms. Muscle spasms may or may not be painful. They can last a few seconds or much longer. HOME CARE  Drink enough fluid to keep your pee (urine) clear or pale yellow.  Massage, stretch, and relax the  muscle.  Use a warm towel, heating pad, or warm shower water on tight muscles.  Place ice on the muscle if it is tender or in pain.  Put ice in a plastic bag.  Place a towel between your skin and the bag.  Leave the ice on for 15-20 minutes, 03-04 times a day.  Only take medicine as told by your doctor. GET HELP RIGHT AWAY IF:  Your  cramps or spasms get worse, happen more often, or do not get better with time. MAKE SURE YOU:  Understand these instructions.  Will watch your condition.  Will get help right away if you are not doing well or get worse. Document Released: 09/30/2008 Document Revised: 02/12/2013 Document Reviewed: 10/04/2012 [facility] Patient Information 2015 [place], [place]. This information is not intended to replace advice given to you by your health care provider. Make sure you discuss any questions you have with your health care provider.  Nausea and Vomiting Nausea is a sick feeling that often comes before throwing up (vomiting). Vomiting is a reflex where stomach contents come out of your mouth. Vomiting can cause severe loss of body fluids (dehydration). Children and elderly adults can become dehydrated quickly, especially if they also have diarrhea. Nausea and vomiting are symptoms of a condition or disease. It is important to find the cause of your symptoms. CAUSES   Direct irritation of the stomach lining. This irritation can result from increased acid production (gastroesophageal reflux disease), infection, food poisoning, taking certain medicines (such as nonsteroidal anti-inflammatory drugs), alcohol use, or tobacco use.  Signals from the brain.These signals could be caused by a headache, heat exposure, an inner ear disturbance, increased pressure in the brain from injury, infection, a tumor, or a concussion, pain, emotional stimulus, or metabolic problems.  An obstruction in the gastrointestinal tract (bowel obstruction).  Illnesses such as diabetes,  hepatitis, gallbladder problems, appendicitis, kidney problems, cancer, sepsis, atypical symptoms of a heart attack, or eating disorders.  Medical treatments such as chemotherapy and radiation.  Receiving medicine that makes you sleep (general anesthetic) during surgery. DIAGNOSIS Your caregiver may ask for tests to be done if the problems do not improve after a few days. Tests may also be done if symptoms are severe or if the reason for the nausea and vomiting is not clear. Tests may include:  Urine tests.  Blood tests.  Stool tests.  Cultures (to look for evidence of infection).  X-rays or other imaging studies. Test results can help your caregiver make decisions about treatment or the need for additional tests. TREATMENT You need to stay well hydrated. Drink frequently but in small amounts.You may wish to drink water, sports drinks, clear broth, or eat frozen ice pops or gelatin dessert to help stay hydrated.When you eat, eating slowly may help prevent nausea.There are also some antinausea medicines that may help prevent nausea. HOME CARE INSTRUCTIONS   Take all medicine as directed by your caregiver.  If you do not have an appetite, do not force yourself to eat. However, you must continue to drink fluids.  If you have an appetite, eat a normal diet unless your caregiver tells you differently.  Eat a variety of complex carbohydrates (rice, wheat, potatoes, bread), lean meats, yogurt, fruits, and vegetables.  Avoid high-fat foods because they are more difficult to digest.  Drink enough water and fluids to keep your urine clear or pale yellow.  If you are dehydrated, ask your caregiver for specific rehydration instructions. Signs of dehydration may include:  Severe thirst.  Dry lips and mouth.  Dizziness.  Dark urine.  Decreasing urine frequency and amount.  Confusion.  Rapid breathing or pulse. SEEK IMMEDIATE MEDICAL CARE IF:   You have blood or brown flecks  (like coffee grounds) in your vomit.  You have black or bloody stools.  You have a severe headache or stiff neck.  You are confused.  You have severe abdominal pain.  You have  chest pain or trouble breathing.  You do not urinate at least once every 8 hours.  You develop cold or clammy skin.  You continue to vomit for longer than 24 to 48 hours.  You have a fever. MAKE SURE YOU:   Understand these instructions.  Will watch your condition.  Will get help right away if you are not doing well or get worse. Document Released: 10/18/2005 Document Revised: 01/10/2012 Document Reviewed: 03/17/2011 [facility] Patient Information 2015 [place], [place]. This information is not intended to replace advice given to you by your health care provider. Make sure you discuss any questions you have with your health care provider.  Sciatica Sciatica is pain, weakness, numbness, or tingling along your sciatic nerve. The nerve starts in the lower back and runs down the back of each leg. Nerve damage or certain conditions pinch or put pressure on the sciatic nerve. This causes the pain, weakness, and other discomforts of sciatica. HOME CARE   Only take medicine as told by your doctor.  Apply ice to the affected area for 20 minutes. Do this 3-4 times a day for the first 48-72 hours. Then try heat in the same way.  Exercise, stretch, or do your usual activities if these do not make your pain worse.  Go to physical therapy as told by your doctor.  Keep all doctor visits as told.  Do not wear high heels or shoes that are not supportive.  Get a firm mattress if your mattress is too soft to lessen pain and discomfort. GET HELP RIGHT AWAY IF:   You cannot control when you poop (bowel movement) or pee (urinate).  You have more weakness in your lower back, lower belly (pelvis), butt (buttocks), or legs.  You have redness or puffiness (swelling) of your back.  You have a burning feeling when you  pee.  You have pain that gets worse when you lie down.  You have pain that wakes you from your sleep.  Your pain is worse than past pain.  Your pain lasts longer than 4 weeks.  You are suddenly losing weight without reason. MAKE SURE YOU:   Understand these instructions.  Will watch this condition.  Will get help right away if you are not doing well or get worse. Document Released: 07/27/2008 Document Revised: 04/18/2012 Document Reviewed: 02/27/2012 [facility] Patient Information 2015 [place], [place]. This information is not intended to replace advice given to you by your health care provider. Make sure you discuss any questions you have with your health care provider.

## 2015-07-08 NOTE — ED Notes (Signed)
Pt screened for [facility]. Pt reports abd pain with vomiting yesterday, in addition to LBP. Hx of "slipped disc".

## 2015-07-08 NOTE — ED Provider Notes (Signed)
CSN: [id number]     Arrival date & time 07/08/15  0920 History   First MD Initiated Contact with Patient 07/08/15 1048     Chief Complaint  Patient presents with  . Back Pain  . Abdominal Pain     (Consider location/radiation/quality/duration/timing/severity/associated sxs/prior Treatment) HPI Comments: [name] is a 34 y.o. female with a PMHx of psoriasis, GERD, HTN, chronic back pain, and abdominal lipoma, with a PSHx of lipoma excision, cholecystectomy, and C-sections x2, who presents to the ED with complaints of recurrent low back pain that began gradually yesterday. She describes pain is 10/10 constant diffuse lower lumbar pain, radiating down the posterior left leg and intermittently into the lower abdomen, stabbing, worse with sitting down or laying, and unrelieved with ibuprofen. She states this is similar to her chronic back pain from a "slipped disc". She reports that last night she had some nausea and 2 episodes of nonbloody nonbilious emesis due to the pain, no ongoing emesis today. She reports that her lower abdominal pain is intermittent and has completely resolved at this time. Associated symptoms also include an intermittent headache which is similar to her prior headaches, currently resolved.  Denies fevers, chills, vision changes, CP, SOB, ongoing abd pain, ongoing N/V/D/C, melena, hematochezia, hematemesis, obstipation, urinary frequency, hematuria, dysuria, flank pain, vaginal bleeding or discharge, numbness, tingling, weakness, incontinence of urine or stool, cauda equina symptoms, lightheadedness, or syncope. Denies any history of cancer or IV drug use. Denies any recent heavy lifting or twisting, no known injuries. LMP ~8/8. She is sexually active with 1 partner, unprotected. She is a history of irregular menses.   Patient is a 34 y.o. female presenting with back pain and abdominal pain. The history is provided by the patient. No language interpreter was used.  Back  Pain Location:  Lumbar spine Quality:  Stabbing Radiates to:  L posterior upper leg Pain severity:  Severe Pain is:  Same all the time Onset quality:  Gradual Duration:  1 day Timing:  Constant Progression:  Unchanged Chronicity:  Recurrent Context: not lifting heavy objects, not recent injury and not twisting   Relieved by:  Nothing Worsened by:  Lying down and sitting Ineffective treatments:  NSAIDs Associated symptoms: abdominal pain (intermittent, resolved at this time) and headaches (intermittent, none now)   Associated symptoms: no bladder incontinence, no bowel incontinence, no chest pain, no dysuria, no fever, no numbness, no paresthesias, no perianal numbness, no tingling and no weakness   Risk factors: no hx of cancer   Abdominal Pain Associated symptoms: nausea and vomiting (2x last night, NBNB, none ongoing)   Associated symptoms: no chest pain, no chills, no constipation, no diarrhea, no dysuria, no fever, no hematuria, no shortness of breath, no vaginal bleeding and no vaginal discharge     Past Medical History  Diagnosis Date  . Psoriasis   . GERD (gastroesophageal reflux disease)   . History of MRSA infection ~ 2006    leg  . Hypertension     under control with med., has been on med. x 6 mos.  . Abdominal lipoma 05/2015   Past Surgical History  Procedure Laterality Date  . Cesarean section  06/21/2008; 08/18/2010  . Cesarean section N/A 07/10/2014    Procedure: REPEAT CESAREAN SECTION;  Surgeon: Daria Pastures, MD;  Location: [facility];  Service: Obstetrics;  Laterality: N/A;  . Lipoma excision  2014    back  . Cholecystectomy  07/10/2008  . Lipoma excision N/A 05/21/2015  Procedure: EXCISION LIPOMAS ON ABDOMEN;  Surgeon: Erroll Luna, MD;  Location: [facility];  Service: General;  Laterality: N/A;   Family History  Problem Relation Age of Onset  . Drug abuse Mother   . Heart disease Maternal Grandmother   . Heart disease Maternal  Grandfather    Social History  Substance Use Topics  . Smoking status: Former Smoker -- 0.00 packs/day    Start date: 05/01/2014  . Smokeless tobacco: Never Used  . Alcohol Use: No   OB History    Gravida Para Term Preterm AB TAB SAB Ectopic Multiple Living   8 3 3  5  5   3      Review of Systems  Constitutional: Negative for fever and chills.  Eyes: Negative for visual disturbance.  Respiratory: Negative for shortness of breath.   Cardiovascular: Negative for chest pain.  Gastrointestinal: Positive for nausea, vomiting (2x last night, NBNB, none ongoing) and abdominal pain (intermittent, resolved at this time). Negative for diarrhea, constipation, blood in stool and bowel incontinence.  Genitourinary: Negative for bladder incontinence, dysuria, hematuria, flank pain, vaginal bleeding and vaginal discharge.  Musculoskeletal: Positive for back pain. Negative for myalgias and arthralgias.  Skin: Negative for color change.  Allergic/Immunologic: Negative for immunocompromised state.  Neurological: Positive for headaches (intermittent, none now). Negative for tingling, syncope, weakness, light-headedness, numbness and paresthesias.  Psychiatric/Behavioral: Negative for confusion.   10 Systems reviewed and are negative for acute change except as noted in the HPI.    Allergies  Bee venom; Penicillins; Mushroom extract complex; and Powder  Home Medications   Prior to Admission medications   Medication Sig Start Date End Date Taking? Authorizing Provider  Adalimumab 40 MG/0.8ML PSKT Inject 40 mg into the skin 2 (two) times a week.     Historical Provider, MD  amLODipine (NORVASC) 5 MG tablet TAKE 1 TABLET BY MOUTH DAILY. 05/07/15   Archie Patten, MD  folic acid (FOLVITE) 1 MG tablet Take 1 mg by mouth daily.    Historical Provider, MD  HYDROcodone-acetaminophen (NORCO) 5-325 MG per tablet Take 1-2 tablets by mouth every 6 (six) hours as needed for moderate pain. 05/21/15   Erroll Luna, MD  ibuprofen (ADVIL,MOTRIN) 200 MG tablet Take 600 mg by mouth every 8 (eight) hours as needed.    Historical Provider, MD  methotrexate (RHEUMATREX) 2.5 MG tablet Take 2.5 mg by mouth once a week.    Historical Provider, MD  pantoprazole (PROTONIX) 40 MG tablet TAKE 1 TABLET BY MOUTH DAILY. 05/07/15   Archie Patten, MD   BP 140/82 mmHg  Pulse 76  Temp(Src) 98.7 F (37.1 C) (Oral)  Resp 22  Ht 5\' 6"  (1.676 m)  Wt 213 lb 9 oz (96.871 kg)  BMI 34.49 kg/m2  SpO2 98%  LMP 06/07/2015 Physical Exam  Constitutional: She is oriented to person, place, and time. Vital signs are normal. She appears well-developed and well-nourished.  Non-toxic appearance. She appears distressed (uncomfortable appearing).  Afebrile, nontoxic, uncomfortable appearing  HENT:  Head: Normocephalic and atraumatic.  Mouth/Throat: Oropharynx is clear and moist and mucous membranes are normal.  Eyes: Conjunctivae and EOM are normal. Right eye exhibits no discharge. Left eye exhibits no discharge.  Neck: Normal range of motion. Neck supple.  No meningismus  Cardiovascular: Normal rate, regular rhythm, normal heart sounds and intact distal pulses.  Exam reveals no gallop and no friction rub.   No murmur heard. Pulmonary/Chest: Effort normal and breath sounds normal. No respiratory  distress. She has no decreased breath sounds. She has no wheezes. She has no rhonchi. She has no rales.  Abdominal: Soft. Normal appearance and bowel sounds are normal. She exhibits no distension. There is no tenderness. There is no rigidity, no rebound, no guarding, no CVA tenderness, no tenderness at McBurney's point and negative Murphy's sign.  Soft, NTND, +BS throughout, no r/g/r, neg murphy's, neg mcburney's, no CVA TTP   Musculoskeletal: Normal range of motion.       Lumbar back: She exhibits tenderness, bony tenderness and spasm. She exhibits normal range of motion and no deformity.  Lumbar spine with FROM intact with mild  diffuse midline spinous process TTP, no bony stepoffs or deformities, diffuse b/l paraspinous muscle TTP with palpable muscle spasms. Strength 5/5 in all extremities, sensation grossly intact in all extremities, +SLR of L leg, neg SLR of R leg, gait steady. No overlying skin changes.   Neurological: She is alert and oriented to person, place, and time. She has normal strength. No sensory deficit. Gait normal.  No focal neuro deficits  Skin: Skin is warm, dry and intact. No rash noted.  Psychiatric: She has a normal mood and affect.  Nursing note and vitals reviewed.   ED Course  Procedures (including critical care time) Labs Review Labs Reviewed  CBC WITH DIFFERENTIAL/PLATELET - Abnormal; Notable for the following:    RDW 15.6 (*)    Eosinophils Relative 11 (*)    Eosinophils Absolute 1.0 (*)    All other components within normal limits  COMPREHENSIVE METABOLIC PANEL - Abnormal; Notable for the following:    CO2 21 (*)    Glucose, Bld 120 (*)    AST 73 (*)    All other components within normal limits  LIPASE, BLOOD - Abnormal; Notable for the following:    Lipase 19 (*)    All other components within normal limits  URINALYSIS, ROUTINE W REFLEX MICROSCOPIC (NOT AT [facility])  POC URINE PREG, ED    Imaging Review Dg Lumbar Spine Complete  07/08/2015   CLINICAL DATA:  Low back and left leg pain beginning 07/07/2015. No known injury. Initial encounter.  EXAM: LUMBAR SPINE - COMPLETE 4+ VIEW  COMPARISON:  None.  FINDINGS: Vertebral body height and alignment are maintained. Intervertebral disc space height is normal in the lumbar spine with minimal anterior endplate spurring off the superior aspects of the L3 and L4 vertebral bodies. There is some anterior endplate spurring in the lower thoracic spine. No pars interarticularis defect is identified. Cholecystectomy and tubal ligation clips are noted.  IMPRESSION: Negative lumbar spine.   Electronically Signed   By: Inge Rise M.D.   On:  07/08/2015 12:40   I have personally reviewed and evaluated these images and lab results as part of my medical decision-making.   EKG Interpretation None      MDM   Final diagnoses:  Bilateral low back pain with left-sided sciatica  Intermittent abdominal pain  Non-intractable vomiting with nausea, vomiting of unspecified type  Spasm of back muscles  Lumbar spondylosis, unspecified spinal osteoarthritis    34 y.o. female here with low back pain similar to prior back pain, no recent new injury/bending/twisting. Pain radiates down L leg, +SLR on L side. No red flag s/s of low back pain. No s/s of central cord compression or cauda equina. Lower extremities are neurovascularly intact and patient is ambulating, but some midline tenderness therefore will obtain xray. Pt states that she has intermittent low abd pain with the  back pain, but none ongoing now, no abd tenderness on exam, doubt need for pelvic exam. Will get basic labs since she had some abd pain and n/v earlier. Will give fluids, pain meds, and nausea meds. Will get U/A and upreg. Will give prednisone for sciatica symptoms then reassess shortly.  12:44 PM Upreg neg. U/A clear. CBC w/diff unremarkable. CMP with mildly elevated AST which has been present before. Lipase WNL. Lumbar xray with mild anterior endplate spurring at R4-2 and some in T spine, no other acute findings. Pain improved initially but now returning, will give PO norco now prior to d/c. Nausea improved, tolerating PO well here. Patient was counseled on back pain precautions and told to do activity as tolerated but do not lift, push, or pull heavy objects more than 10 pounds for the next week. Patient counseled to use ice or heat on back for no longer than 15 minutes every hour.   Rx given for muscle relaxer and counseled on proper use of muscle relaxant medication. Rx given for narcotic pain medicine and counseled on proper use of narcotic pain medications. Told that they  can increase to every 4 hrs if needed while pain is worse. Counseled not to combine this medication with others containing tylenol. Urged patient not to drink alcohol, drive, or perform any other activities that requires focus while taking either of these medications. Short course prednisone given. Zofran given.   Patient urged to follow-up with PCP if pain does not improve with treatment and rest or if pain becomes recurrent. Urged to return with worsening severe pain, loss of bowel or bladder control, trouble walking. The patient verbalizes understanding and agrees with the plan.   BP 129/78 mmHg  Pulse 69  Temp(Src) 98.7 F (37.1 C) (Oral)  Resp 20  Ht 5\' 6"  (1.676 m)  Wt 213 lb 9 oz (96.871 kg)  BMI 34.49 kg/m2  SpO2 98%  LMP 06/07/2015  Meds ordered this encounter  Medications  . morphine 4 MG/ML injection 4 mg    Sig:   . ondansetron (ZOFRAN) injection 4 mg    Sig:   . predniSONE (DELTASONE) tablet 60 mg    Sig:   . sodium chloride 0.9 % bolus 1,000 mL    Sig:   . predniSONE (DELTASONE) 20 MG tablet    Sig: 3 tabs po daily x 3 days    Dispense:  9 tablet    Refill:  0    Order Specific Question:  Supervising Provider    Answer:  MILLER, BRIAN [[id number]]  . naproxen (NAPROSYN) 500 MG tablet    Sig: Take 1 tablet (500 mg total) by mouth 2 (two) times daily as needed for mild pain, moderate pain or headache (TAKE WITH MEALS.).    Dispense:  20 tablet    Refill:  0    Order Specific Question:  Supervising Provider    Answer:  MILLER, BRIAN [[id number]]  . HYDROcodone-acetaminophen (NORCO) 5-325 MG per tablet    Sig: Take 1 tablet by mouth every 6 (six) hours as needed for severe pain.    Dispense:  6 tablet    Refill:  0    Order Specific Question:  Supervising Provider    Answer:  MILLER, BRIAN [[id number]]  . ondansetron (ZOFRAN) 8 MG tablet    Sig: Take 1 tablet (8 mg total) by mouth every 8 (eight) hours as needed for nausea or vomiting.    Dispense:  10 tablet    Refill:  0  Order Specific Question:  Supervising Provider    Answer:  Noemi Chapel [[id number]]  . cyclobenzaprine (FLEXERIL) 10 MG tablet    Sig: Take 1 tablet (10 mg total) by mouth 3 (three) times daily as needed for muscle spasms.    Dispense:  15 tablet    Refill:  0    Order Specific Question:  Supervising Provider    Answer:  MILLER, BRIAN [[id number]]  . HYDROcodone-acetaminophen (NORCO/VICODIN) 5-325 MG per tablet 1 tablet    Sig:      Tonea Leiphart Camprubi-Soms, PA-C 07/08/15 [place], MD 07/08/15 [phone]

## 2015-07-23 ENCOUNTER — Encounter: Payer: Self-pay | Admitting: Family Medicine

## 2015-07-23 ENCOUNTER — Ambulatory Visit (INDEPENDENT_AMBULATORY_CARE_PROVIDER_SITE_OTHER): Payer: Medicaid Other | Admitting: Family Medicine

## 2015-07-23 VITALS — BP 135/83 | HR 92 | Temp 98.4°F | Ht 66.0 in | Wt 213.0 lb

## 2015-07-23 DIAGNOSIS — M5442 Lumbago with sciatica, left side: Secondary | ICD-10-CM | POA: Diagnosis present

## 2015-07-23 DIAGNOSIS — M545 Low back pain, unspecified: Secondary | ICD-10-CM | POA: Insufficient documentation

## 2015-07-23 MED ORDER — TRAMADOL HCL 50 MG PO TABS: 50.0000 mg | ORAL_TABLET | Freq: Three times a day (TID) | ORAL | Status: DC | PRN

## 2015-07-23 NOTE — Progress Notes (Signed)
  HPI:  Patient presents to f/u on low back pain. Was seen in ED on 9/6 for acute low back pain. Had xrays which were negative for acute issue, positive for some degenerative chronic changes.  Patient has pain in L side of low back, radiating down L leg to the back of her knee. She describes this as a "slipped disc". Reports issues with low back pain in the past, about 10 years ago. Has never seen back specialist. Has never done physical therapy for this. In ED was given rx for norco and flexeril, which she has not taken much of due to needing to work. 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 Taking ibuprofen, which helps a little. Pain is overall slightly better, but still uncomfortable. Denies having fever, saddle anesthesia, lower extremity weakness, or problems with stooling or urination.   ROS: See HPI.  [place]: history of hypertension, obesity  PHYSICAL EXAM: BP 135/83 mmHg  Pulse 92  Temp(Src) 98.4 F (36.9 C) (Oral)  Ht 5\' 6"  (1.676 m)  Wt 213 lb (96.616 kg)  BMI 34.40 kg/m2  LMP 07/08/2015 Gen: NAD, pleasant, cooperative HEENT: NCAT Back: L side of low back mildly TTP Ext: full strength bilateral lower extremities. Sensation intact over bilateral lower extremities. 1+ patellar reflexes bilaterally.   ASSESSMENT/PLAN:  Low back pain No red flags. Discussed with patient that most cases of acute low back pain resolve within 6 weeks and that additional imaging or evaluation is not warranted at this time. Patient strongly prefers to see orthopedic specialist given her history of back issues in the past. This is reasonable. Plan: - referral to orthopedics - rx for tramadol, counseled on risk of sedation but may be less than norco - handout given on back exercises - add heating pad   FOLLOW UP: Referring to orthopedics for further evaluation.  [place]. Ardelia Mems, [place]

## 2015-07-23 NOTE — Patient Instructions (Signed)
I am referring you to orthopedic specialist for your back. You will get a phone call to schedule this appointment.  Continue ibuprofen as needed Try tramadol - prescribed this today. Might make you sleepy. Get heating pad See handout with exercises  Be well, Dr. Ardelia Mems

## 2015-07-23 NOTE — Assessment & Plan Note (Signed)
No red flags. Discussed with patient that most cases of acute low back pain resolve within 6 weeks and that additional imaging or evaluation is not warranted at this time. Patient strongly prefers to see orthopedic specialist given her history of back issues in the past. This is reasonable. Plan: - referral to orthopedics - rx for tramadol, counseled on risk of sedation but may be less than norco - handout given on back exercises - add heating pad

## 2015-08-12 ENCOUNTER — Telehealth: Payer: Self-pay | Admitting: Family Medicine

## 2015-08-12 DIAGNOSIS — Z124 Encounter for screening for malignant neoplasm of cervix: Secondary | ICD-10-CM

## 2015-08-12 DIAGNOSIS — Z01419 Encounter for gynecological examination (general) (routine) without abnormal findings: Secondary | ICD-10-CM

## 2015-08-12 NOTE — Telephone Encounter (Signed)
Needs referral to [facility]

## 2015-08-13 NOTE — Telephone Encounter (Signed)
Patient informed and referral made for her to resume her gynecologic care at [place], MD [facility] Resident  08/13/2015, 6:11 PM

## 2015-11-05 ENCOUNTER — Encounter (HOSPITAL_COMMUNITY): Payer: Self-pay | Admitting: Emergency Medicine

## 2015-11-05 ENCOUNTER — Emergency Department (HOSPITAL_COMMUNITY)
Admission: EM | Admit: 2015-11-05 | Discharge: 2015-11-05 | Disposition: A | Discharge status: Home / Self Care | Payer: Medicaid Other | Attending: Emergency Medicine | Admitting: Emergency Medicine

## 2015-11-05 DIAGNOSIS — Z8614 Personal history of Methicillin resistant Staphylococcus aureus infection: Secondary | ICD-10-CM | POA: Diagnosis not present

## 2015-11-05 DIAGNOSIS — Z880 Allergy status to penicillin: Secondary | ICD-10-CM | POA: Insufficient documentation

## 2015-11-05 DIAGNOSIS — Z872 Personal history of diseases of the skin and subcutaneous tissue: Secondary | ICD-10-CM | POA: Insufficient documentation

## 2015-11-05 DIAGNOSIS — F17210 Nicotine dependence, cigarettes, uncomplicated: Secondary | ICD-10-CM | POA: Insufficient documentation

## 2015-11-05 DIAGNOSIS — J069 Acute upper respiratory infection, unspecified: Secondary | ICD-10-CM | POA: Diagnosis not present

## 2015-11-05 DIAGNOSIS — Z86018 Personal history of other benign neoplasm: Secondary | ICD-10-CM | POA: Insufficient documentation

## 2015-11-05 DIAGNOSIS — K219 Gastro-esophageal reflux disease without esophagitis: Secondary | ICD-10-CM | POA: Insufficient documentation

## 2015-11-05 DIAGNOSIS — H109 Unspecified conjunctivitis: Secondary | ICD-10-CM | POA: Diagnosis not present

## 2015-11-05 DIAGNOSIS — Z79899 Other long term (current) drug therapy: Secondary | ICD-10-CM | POA: Insufficient documentation

## 2015-11-05 DIAGNOSIS — I10 Essential (primary) hypertension: Secondary | ICD-10-CM | POA: Insufficient documentation

## 2015-11-05 DIAGNOSIS — J029 Acute pharyngitis, unspecified: Secondary | ICD-10-CM | POA: Diagnosis present

## 2015-11-05 MED ORDER — TETRACAINE HCL 0.5 % OP SOLN
1.0000 [drp] | Freq: Once | OPHTHALMIC | Status: AC
  Administered 2015-11-05: 1 [drp] via OPHTHALMIC
  Filled 2015-11-05: qty 2

## 2015-11-05 MED ORDER — NAPROXEN 250 MG PO TABS: 250.0000 mg | ORAL_TABLET | Freq: Two times a day (BID) | ORAL | Status: DC

## 2015-11-05 MED ORDER — ACETAMINOPHEN 325 MG PO TABS
650.0000 mg | ORAL_TABLET | Freq: Once | ORAL | Status: AC
Start: 2015-11-05 — End: 2015-11-05
  Administered 2015-11-05: 650 mg via ORAL
  Filled 2015-11-05: qty 2

## 2015-11-05 MED ORDER — FLUTICASONE PROPIONATE 50 MCG/ACT NA SUSP: 2.0000 | Freq: Every day | NASAL | Status: DC

## 2015-11-05 MED ORDER — FLUORESCEIN SODIUM 1 MG OP STRP
1.0000 | ORAL_STRIP | Freq: Once | OPHTHALMIC | Status: AC
  Administered 2015-11-05: 1 via OPHTHALMIC
  Filled 2015-11-05: qty 1

## 2015-11-05 MED ORDER — CETIRIZINE HCL 10 MG PO TABS: 10.0000 mg | ORAL_TABLET | Freq: Every day | ORAL | Status: DC

## 2015-11-05 MED ORDER — ERYTHROMYCIN 5 MG/GM OP OINT: 1.0000 "application " | TOPICAL_OINTMENT | Freq: Four times a day (QID) | OPHTHALMIC | Status: DC

## 2015-11-05 NOTE — ED Notes (Signed)
See PA assessment 

## 2015-11-05 NOTE — Discharge Instructions (Signed)
Bacterial Conjunctivitis Bacterial conjunctivitis, commonly called pink eye, is an inflammation of the clear membrane that covers the white part of the eye (conjunctiva). The inflammation can also happen on the underside of the eyelids. The blood vessels in the conjunctiva become inflamed, causing the eye to become red or pink. Bacterial conjunctivitis may spread easily from one eye to another and from person to person (contagious).  CAUSES  Bacterial conjunctivitis is caused by bacteria. The bacteria may come from your own skin, your upper respiratory tract, or from someone else with bacterial conjunctivitis. SYMPTOMS  The normally white color of the eye or the underside of the eyelid is usually pink or red. The pink eye is usually associated with irritation, tearing, and some sensitivity to light. Bacterial conjunctivitis is often associated with a thick, yellowish discharge from the eye. The discharge may turn into a crust on the eyelids overnight, which causes your eyelids to stick together. If a discharge is present, there may also be some blurred vision in the affected eye. DIAGNOSIS  Bacterial conjunctivitis is diagnosed by your caregiver through an eye exam and the symptoms that you report. Your caregiver looks for changes in the surface tissues of your eyes, which may point to the specific type of conjunctivitis. A sample of any discharge may be collected on a cotton-tip swab if you have a severe case of conjunctivitis, if your cornea is affected, or if you keep getting repeat infections that do not respond to treatment. The sample will be sent to a lab to see if the inflammation is caused by a bacterial infection and to see if the infection will respond to antibiotic medicines. TREATMENT   Bacterial conjunctivitis is treated with antibiotics. Antibiotic eyedrops are most often used. However, antibiotic ointments are also available. Antibiotics pills are sometimes used. Artificial tears or eye  washes may ease discomfort. HOME CARE INSTRUCTIONS   To ease discomfort, apply a cool, clean washcloth to your eye for 10-20 minutes, 3-4 times a day.  Gently wipe away any drainage from your eye with a warm, wet washcloth or a cotton ball.  Wash your hands often with soap and water. Use paper towels to dry your hands.  Do not share towels or washcloths. This may spread the infection.  Change or wash your pillowcase every day.  You should not use eye makeup until the infection is gone.  Do not operate machinery or drive if your vision is blurred.  Stop using contact lenses. Ask your caregiver how to sterilize or replace your contacts before using them again. This depends on the type of contact lenses that you use.  When applying medicine to the infected eye, do not touch the edge of your eyelid with the eyedrop bottle or ointment tube. SEEK IMMEDIATE MEDICAL CARE IF:   Your infection has not improved within 3 days after beginning treatment.  You had yellow discharge from your eye and it returns.  You have increased eye pain.  Your eye redness is spreading.  Your vision becomes blurred.  You have a fever or persistent symptoms for more than 2-3 days.  You have a fever and your symptoms suddenly get worse.  You have facial pain, redness, or swelling. MAKE SURE YOU:   Understand these instructions.  Will watch your condition.  Will get help right away if you are not doing well or get worse.   This information is not intended to replace advice given to you by your health care provider. Make sure you   discuss any questions you have with your health care provider.   Document Released: 10/18/2005 Document Revised: 11/08/2014 Document Reviewed: 03/20/2012 Elsevier Interactive Patient Education 2016 Elsevier Inc. Pharyngitis Pharyngitis is redness, pain, and swelling (inflammation) of your pharynx.  CAUSES  Pharyngitis is usually caused by infection. Most of the time, these  infections are from viruses (viral) and are part of a cold. However, sometimes pharyngitis is caused by bacteria (bacterial). Pharyngitis can also be caused by allergies. Viral pharyngitis may be spread from person to person by coughing, sneezing, and personal items or utensils (cups, forks, spoons, toothbrushes). Bacterial pharyngitis may be spread from person to person by more intimate contact, such as kissing.  SIGNS AND SYMPTOMS  Symptoms of pharyngitis include:   Sore throat.   Tiredness (fatigue).   Low-grade fever.   Headache.  Joint pain and muscle aches.  Skin rashes.  Swollen lymph nodes.  Plaque-like film on throat or tonsils (often seen with bacterial pharyngitis). DIAGNOSIS  Your health care provider will ask you questions about your illness and your symptoms. Your medical history, along with a physical exam, is often all that is needed to diagnose pharyngitis. Sometimes, a rapid strep test is done. Other lab tests may also be done, depending on the suspected cause.  TREATMENT  Viral pharyngitis will usually get better in 3-4 days without the use of medicine. Bacterial pharyngitis is treated with medicines that kill germs (antibiotics).  HOME CARE INSTRUCTIONS   Drink enough water and fluids to keep your urine clear or pale yellow.   Only take over-the-counter or prescription medicines as directed by your health care provider:   If you are prescribed antibiotics, make sure you finish them even if you start to feel better.   Do not take aspirin.   Get lots of rest.   Gargle with 8 oz of salt water ( tsp of salt per 1 qt of water) as often as every 1-2 hours to soothe your throat.   Throat lozenges (if you are not at risk for choking) or sprays may be used to soothe your throat. SEEK MEDICAL CARE IF:   You have large, tender lumps in your neck.  You have a rash.  You cough up green, yellow-brown, or bloody spit. SEEK IMMEDIATE MEDICAL CARE IF:    Your neck becomes stiff.  You drool or are unable to swallow liquids.  You vomit or are unable to keep medicines or liquids down.  You have severe pain that does not go away with the use of recommended medicines.  You have trouble breathing (not caused by a stuffy nose). MAKE SURE YOU:   Understand these instructions.  Will watch your condition.  Will get help right away if you are not doing well or get worse.   This information is not intended to replace advice given to you by your health care provider. Make sure you discuss any questions you have with your health care provider.   Document Released: 10/18/2005 Document Revised: 08/08/2013 Document Reviewed: 06/25/2013 Elsevier Interactive Patient Education [employer].

## 2015-11-05 NOTE — ED Notes (Signed)
Patient states has had sore throat and some nasal congestion x 1 week.  Patient complains of R eye pain and redness since yesterday. Denies other symptoms.

## 2015-11-05 NOTE — ED Provider Notes (Signed)
CSN: [id number]     Arrival date & time 11/05/15  0809 History   First MD Initiated Contact with Patient 11/05/15 [phone]     Chief Complaint  Patient presents with  . Conjunctivitis  . Sore Throat   [name] is a 35 y.o. female with a history of GERD and hypertension who presents to the emergency department complaining of sore throat and right eye discharge since yesterday. Patient also reports slight cough for the past week. She reports associated symptoms of runny nose, postnasal drip, dry throat and dry scratchy eyes. She has taking nothing for treatment today. She currently complains of 7 out of 10 pain. She reports her pain is worse with swallowing. She reports she's been able to eat and drink normally. She does not wear contacts or glasses. The patient denies fevers, rashes, ear pain, ear pressure, it mouth discharge, trouble swallowing, neck pain, neck stiffness, wheezing, shortness of breath, abdominal pain, nausea, vomiting or diarrhea.  (Consider location/radiation/quality/duration/timing/severity/associated sxs/prior Treatment) HPI  Past Medical History  Diagnosis Date  . Psoriasis   . GERD (gastroesophageal reflux disease)   . History of MRSA infection ~ 2006    leg  . Hypertension     under control with med., has been on med. x 6 mos.  . Abdominal lipoma 05/2015   Past Surgical History  Procedure Laterality Date  . Cesarean section  06/21/2008; 08/18/2010  . Cesarean section N/A 07/10/2014    Procedure: REPEAT CESAREAN SECTION;  Surgeon: Daria Pastures, MD;  Location: [facility];  Service: Obstetrics;  Laterality: N/A;  . Lipoma excision  2014    back  . Cholecystectomy  07/10/2008  . Lipoma excision N/A 05/21/2015    Procedure: EXCISION LIPOMAS ON ABDOMEN;  Surgeon: Erroll Luna, MD;  Location: [facility];  Service: General;  Laterality: N/A;   Family History  Problem Relation Age of Onset  . Drug abuse Mother   . Heart disease Maternal Grandmother   .  Heart disease Maternal Grandfather    Social History  Substance Use Topics  . Smoking status: Current Every Day Smoker -- 0.50 packs/day    Types: Cigarettes    Start date: 05/01/2014  . Smokeless tobacco: Never Used  . Alcohol Use: No   OB History    Gravida Para Term Preterm AB TAB SAB Ectopic Multiple Living   8 3 3  5  5   3      Review of Systems  Constitutional: Negative for fever and chills.  HENT: Positive for postnasal drip, rhinorrhea, sneezing and sore throat. Negative for congestion, ear discharge, ear pain, mouth sores, sinus pressure and trouble swallowing.   Eyes: Positive for discharge, redness and itching. Negative for photophobia, pain and visual disturbance.  Respiratory: Positive for cough. Negative for chest tightness, shortness of breath and wheezing.   Cardiovascular: Negative for chest pain and palpitations.  Gastrointestinal: Negative for nausea, vomiting, abdominal pain and diarrhea.  Genitourinary: Negative for dysuria.  Musculoskeletal: Negative for neck pain and neck stiffness.  Skin: Negative for rash.  Neurological: Negative for light-headedness and headaches.      Allergies  Bee venom; Penicillins; Mushroom extract complex; and Powder  Home Medications   Prior to Admission medications   Medication Sig Start Date End Date Taking? Authorizing Provider  amLODipine (NORVASC) 5 MG tablet TAKE 1 TABLET BY MOUTH DAILY. 05/07/15  Yes Archie Patten, MD  pantoprazole (PROTONIX) 40 MG tablet TAKE 1 TABLET BY MOUTH DAILY. 05/07/15  Yes  Archie Patten, MD  Adalimumab 40 MG/0.8ML PSKT Inject 40 mg into the skin 2 (two) times a week.     Historical Provider, MD  cetirizine (ZYRTEC ALLERGY) 10 MG tablet Take 1 tablet (10 mg total) by mouth daily. 11/05/15   Waynetta Pean, PA-C  cyclobenzaprine (FLEXERIL) 10 MG tablet Take 1 tablet (10 mg total) by mouth 3 (three) times daily as needed for muscle spasms. 07/08/15   Mercedes Camprubi-Soms, PA-C  erythromycin  ophthalmic ointment Place 1 application into the right eye every 6 (six) hours. Place 1/2 inch ribbon of ointment in the affected eye 4 times a day 11/05/15   Waynetta Pean, PA-C  fluticasone [facility]) 50 MCG/ACT nasal spray Place 2 sprays into both nostrils daily. 11/05/15   Waynetta Pean, PA-C  folic acid (FOLVITE) 1 MG tablet Take 1 mg by mouth daily.    Historical Provider, MD  HYDROcodone-acetaminophen (NORCO) 5-325 MG per tablet Take 1 tablet by mouth every 6 (six) hours as needed for severe pain. 07/08/15   Mercedes Camprubi-Soms, PA-C  ibuprofen (ADVIL,MOTRIN) 200 MG tablet Take 600 mg by mouth every 8 (eight) hours as needed.    Historical Provider, MD  methotrexate (RHEUMATREX) 2.5 MG tablet Take 2.5 mg by mouth once a week.    Historical Provider, MD  naproxen (NAPROSYN) 250 MG tablet Take 1 tablet (250 mg total) by mouth 2 (two) times daily with a meal. 11/05/15   Waynetta Pean, PA-C  ondansetron (ZOFRAN) 8 MG tablet Take 1 tablet (8 mg total) by mouth every 8 (eight) hours as needed for nausea or vomiting. 07/08/15   Mercedes Camprubi-Soms, PA-C  predniSONE (DELTASONE) 20 MG tablet 3 tabs po daily x 3 days 07/08/15   Mercedes Camprubi-Soms, PA-C  traMADol (ULTRAM) 50 MG tablet Take 1 tablet (50 mg total) by mouth every 8 (eight) hours as needed. 07/23/15   Leeanne Rio, MD   BP 137/88 mmHg  Pulse 75  Temp(Src) 97.7 F (36.5 C) (Oral)  Resp 18  Ht 5\' 6"  (1.676 m)  Wt 89.614 kg  BMI 31.90 kg/m2  SpO2 97%  LMP 10/30/2015 Physical Exam  Constitutional: She appears well-developed and well-nourished. No distress.  Nontoxic appearing.  HENT:  Head: Normocephalic and atraumatic.  Right Ear: External ear normal.  Left Ear: External ear normal.  Mouth/Throat: No oropharyngeal exudate.  Bilateral tympanic membranes are pearly-gray without erythema or loss of landmarks.  Rhinorrhea. Bilateral tonsillar hypertrophy without exudates. Uvula is midline without edema. No peritonsillar  abscess. No trismus. No drooling.  Eyes: EOM are normal. Pupils are equal, round, and reactive to light. Right eye exhibits discharge. Left eye exhibits no discharge.  Mild right conjunctival injection. Dry discharge to her right eyelashes. EOMs intact.  Right eye was anesthetized with tetracaine and stained with fluorescein. No fluorescein uptake on exam. No sign of corneal abrasion or herpetic lesions. No sign of corneal ulcers.  Neck: Normal range of motion. Neck supple.  Cardiovascular: Normal rate, regular rhythm, normal heart sounds and intact distal pulses.  Exam reveals no gallop and no friction rub.   No murmur heard. Pulmonary/Chest: Effort normal and breath sounds normal. No respiratory distress. She has no wheezes. She has no rales.  Lungs clear to auscultation bilaterally.  Abdominal: Soft. There is no tenderness.  Musculoskeletal: She exhibits no edema.  Lymphadenopathy:    She has no cervical adenopathy.  Neurological: She is alert. Coordination normal.  Skin: Skin is warm and dry. No rash noted. She is not  diaphoretic. No erythema. No pallor.  Psychiatric: She has a normal mood and affect. Her behavior is normal.  Nursing note and vitals reviewed.   ED Course  Procedures (including critical care time) Labs Review Labs Reviewed - No data to display  Imaging Review No results found. I have personally reviewed and evaluated these images and lab results as part of my medical decision-making.   EKG Interpretation None      Filed Vitals:   11/05/15 0824  BP: 137/88  Pulse: 75  Temp: 97.7 F (36.5 C)  TempSrc: Oral  Resp: 18  Height: 5\' 6"  (1.676 m)  Weight: 89.614 kg  SpO2: 97%     MDM   Meds given in ED:  Medications  fluorescein ophthalmic strip 1 strip (1 strip Both Eyes Given 11/05/15 0830)  tetracaine (PONTOCAINE) 0.5 % ophthalmic solution 1 drop (1 drop Both Eyes Given 11/05/15 0830)  acetaminophen (TYLENOL) tablet 650 mg (650 mg Oral Given 11/05/15 0830)     New Prescriptions   CETIRIZINE (ZYRTEC ALLERGY) 10 MG TABLET    Take 1 tablet (10 mg total) by mouth daily.   ERYTHROMYCIN OPHTHALMIC OINTMENT    Place 1 application into the right eye every 6 (six) hours. Place 1/2 inch ribbon of ointment in the affected eye 4 times a day   FLUTICASONE (FLONASE) 50 MCG/ACT NASAL SPRAY    Place 2 sprays into both nostrils daily.   NAPROXEN (NAPROSYN) 250 MG TABLET    Take 1 tablet (250 mg total) by mouth 2 (two) times daily with a meal.    Final diagnoses:  URI (upper respiratory infection)  Conjunctivitis of right eye   This  is a 34 y.o. female with a history of GERD and hypertension who presents to the emergency department complaining of sore throat and right eye discharge since yesterday. Patient also reports slight cough for the past week. She reports associated symptoms of runny nose, postnasal drip, dry throat and dry scratchy eyes. She has taking nothing for treatment today. She currently complains of 7 out of 10 pain. She reports her pain is worse with swallowing. She reports she's been able to eat and drink normally. She does not wear contacts or glasses. The patient denies fevers.  On exam patient is afebrile nontoxic appearing. She has mild bilateral tonsillar hypertrophy without exudates. Uvula is midline without edema. No trismus or peritonsillar abscess. Patient's right eye has conjunctival injection with dry discharge. Her right eye was stained with fluorescein with no evidence of fluorescein uptake or corneal ulcers on exam. Lungs are clear to auscultation bilaterally. Patient with URI and conjunctivitis. Will start on erythromycin ointment and prescriptions for Zyrtec, Flonase and naproxen. Encouraged close follow up by primary care and strict return precautions. I advised the patient to follow-up with their primary care provider this week. I advised the patient to return to the emergency department with new or worsening symptoms or new concerns.  The patient verbalized understanding and agreement with plan.        Waynetta Pean, PA-C 11/05/15 [id number]  Harvel Quale, MD 11/08/15 1001

## 2016-10-18 ENCOUNTER — Encounter: Payer: Self-pay | Admitting: Family Medicine

## 2016-10-18 ENCOUNTER — Ambulatory Visit (INDEPENDENT_AMBULATORY_CARE_PROVIDER_SITE_OTHER): Payer: Medicaid Other | Admitting: Family Medicine

## 2016-10-18 ENCOUNTER — Other Ambulatory Visit (HOSPITAL_COMMUNITY)
Admission: RE | Admit: 2016-10-18 | Discharge: 2016-10-18 | Disposition: A | Discharge status: Home / Self Care | Payer: Medicaid Other | Source: Ambulatory Visit | Attending: Family Medicine | Admitting: Family Medicine

## 2016-10-18 VITALS — BP 136/90 | HR 82 | Temp 97.8°F | Ht 66.0 in | Wt 189.6 lb

## 2016-10-18 DIAGNOSIS — Z1151 Encounter for screening for human papillomavirus (HPV): Secondary | ICD-10-CM | POA: Insufficient documentation

## 2016-10-18 DIAGNOSIS — Z113 Encounter for screening for infections with a predominantly sexual mode of transmission: Secondary | ICD-10-CM | POA: Diagnosis present

## 2016-10-18 DIAGNOSIS — Z01419 Encounter for gynecological examination (general) (routine) without abnormal findings: Secondary | ICD-10-CM | POA: Diagnosis present

## 2016-10-18 DIAGNOSIS — I10 Essential (primary) hypertension: Secondary | ICD-10-CM | POA: Diagnosis not present

## 2016-10-18 DIAGNOSIS — Z0000 Encounter for general adult medical examination without abnormal findings: Secondary | ICD-10-CM

## 2016-10-18 DIAGNOSIS — J0140 Acute pansinusitis, unspecified: Secondary | ICD-10-CM | POA: Diagnosis not present

## 2016-10-18 DIAGNOSIS — J329 Chronic sinusitis, unspecified: Secondary | ICD-10-CM | POA: Insufficient documentation

## 2016-10-18 LAB — CBC
HCT: 36.8 % (ref 35.0–45.0)
Hemoglobin: 11.8 g/dL (ref 11.7–15.5)
MCH: 23.3 pg — ABNORMAL LOW (ref 27.0–33.0)
MCHC: 32.1 g/dL (ref 32.0–36.0)
MCV: 72.7 fL — ABNORMAL LOW (ref 80.0–100.0)
MPV: 9.6 fL (ref 7.5–12.5)
Platelets: 315 10*3/uL (ref 140–400)
RBC: 5.06 MIL/uL (ref 3.80–5.10)
RDW: 17.1 % — ABNORMAL HIGH (ref 11.0–15.0)
WBC: 9.3 10*3/uL (ref 3.8–10.8)

## 2016-10-18 LAB — COMPLETE METABOLIC PANEL WITH GFR
ALT: 15 U/L (ref 6–29)
AST: 16 U/L (ref 10–30)
Albumin: 4.1 g/dL (ref 3.6–5.1)
Alkaline Phosphatase: 67 U/L (ref 33–115)
BUN: 7 mg/dL (ref 7–25)
CO2: 24 mmol/L (ref 20–31)
Calcium: 9.2 mg/dL (ref 8.6–10.2)
Chloride: 105 mmol/L (ref 98–110)
Creat: 0.6 mg/dL (ref 0.50–1.10)
GFR, Est African American: >89 mL/min (ref >=60)
GFR, Est Non African American: >89 mL/min (ref >=60)
Glucose, Bld: 82 mg/dL (ref 65–99)
Potassium: 4.1 mmol/L (ref 3.5–5.3)
Sodium: 139 mmol/L (ref 135–146)
Total Bilirubin: 0.3 mg/dL (ref 0.2–1.2)
Total Protein: 7.1 g/dL (ref 6.1–8.1)

## 2016-10-18 LAB — LIPID PANEL
Cholesterol: 168 mg/dL (ref <200)
HDL: 26 mg/dL — ABNORMAL LOW (ref >50)
LDL Cholesterol: 114 mg/dL — ABNORMAL HIGH (ref <100)
Total CHOL/HDL Ratio: 6.5 Ratio — ABNORMAL HIGH (ref <5.0)
Triglycerides: 142 mg/dL (ref <150)
VLDL: 28 mg/dL (ref <30)

## 2016-10-18 MED ORDER — AZITHROMYCIN 250 MG PO TABS: ORAL_TABLET | ORAL | 0 refills | Status: DC

## 2016-10-18 NOTE — Patient Instructions (Signed)
I have prescribed you an antibiotic for your sinus infection.  I will call you with the results of your lab work if they are NOT normal, otherwise you'll get a letter in the mail.  Things to do to Keep yourself Healthy - Exercise at least 30-45 minutes a day,  3-4 days a week.  - Eat a low-fat diet with lots of fruits and vegetables, up to 7-9 servings per day. - Seatbelts can save your life. Wear them always. - Smoke detectors on every level of your home, check batteries every year. - Eye Doctor - have an eye exam every 1-2 years - Alcohol If you drink, do it moderately,less than 2 drinks per day. - [place].  Choose someone to speak for you if you are not able. - Depression is common in our stressful world.If you're feeling down or losing interest in things you normally enjoy, please come in for a visit.

## 2016-10-18 NOTE — Progress Notes (Signed)
35 y.o. year old female presents for well woman/preventative visit and annual GYN examination.  Acute Concerns: Started having a productive cough over the last month, yellow in color. She feels the cough is getting worse. She notes nasal congestion, worse at nighttime. Also notes facial pain/headaches. She also notes hoarseness, but no sore throat.  No rhinorrhea. No known fevers or chills. No otalgias but feels like her ears get stopped up. No SOB or chest pain. No wheezing.   Diet: Poor appetite. Varied otherwise.   Exercise: walks several times per week.   Sexual/Birth History: LMP: 10/11/16  Birth Control: BTL    Social:  Social History   Social History  . Marital status: Married    Spouse name: N/A  . Number of children: N/A  . Years of education: N/A   Social History Main Topics  . Smoking status: Current Every Day Smoker    Packs/day: 0.50    Types: Cigarettes    Start date: 05/01/2014  . Smokeless tobacco: Never Used  . Alcohol use No  . Drug use: No  . Sexual activity: Yes    Birth control/ protection: None   Other Topics Concern  . None   Social History Narrative  . None   Family History  Problem Relation Age of Onset  . Drug abuse Mother   . Heart disease Maternal Grandmother   . Heart disease Maternal Grandfather      Immunization: Immunization History  Administered Date(s) Administered  . Influenza,inj,Quad PF,36+ Mos 07/06/2013  . Pneumococcal Polysaccharide-23 07/11/2014  . Tdap 07/11/2014    Cancer Screening:  Pap Smear: 01/2010- negative for malignancy(co-testing not preformed)- from last annual exam, pt was due April 2016 with OB.   Mammogram: N/A  Colonoscopy: N/A   Physical Exam: VITALS: Reviewed Blood pressure 136/90, pulse 82, temperature 97.8 F (36.6 C), temperature source Oral, height 5\' 6"  (1.676 m), weight 189 lb 9.6 oz (86 kg), last menstrual period 10/11/2016, SpO2 99 %. GEN: Pleasant female, NAD HEENT: Normocephalic, PERRL,  EOMI, no scleral icterus, bilateral TM pearly grey, nasal septum midline, boggy nasal turbinates, MMM, uvula midline, no anterior or posterior lymphadenopathy, no thyromegaly. Significant tenderness to palpation over the frontal and maxillary sinuses.  CARDIAC:RRR, S1 and S2 present, no murmur, no heaves/thrills RESP: CTAB, normal effort ABD: soft, no tenderness, normal bowel sounds GU/GYN:Exam performed in the presence of a chaperone. GYN:  External genitalia within normal limits.  Vaginal mucosa pink, moist, normal rugae.  Mildly friable cervix without lesions, no discharge noted on speculum exam.  Bimanual exam revealed normal, nongravid uterus.  No cervical motion tenderness. No adnexal masses bilaterally.   EXT: No edema, 2+ radial and DP pulses SKIN: no rash  ASSESSMENT & PLAN: 35 y.o. female presents for annual well woman/preventative exam and GYN exam. Please see problem specific assessment and plan.   Hypertension Patient self discontinued amlodipine. BP at goal per JNC 8 guidelines.  Health care maintenance Pap smear performed today. Declined flu vaccine. Discussed regular exercise and a well balanced diet. Discussed importance of smoking cessation. Patient is fasting, will get lipid panel today. Not due for mammogram or colonoscopy yet  Sinusitis Given duration of symptoms, will treat as bacterial infection. Patient has anaphylaxis to penicillin. -Rx for azithromcyin x 5 days. -discussed saline washes and symptomatic treatment. - return precautions discussed.

## 2016-10-18 NOTE — Assessment & Plan Note (Signed)
Given duration of symptoms, will treat as bacterial infection. Patient has anaphylaxis to penicillin. -Rx for azithromcyin x 5 days. -discussed saline washes and symptomatic treatment. - return precautions discussed.

## 2016-10-18 NOTE — Assessment & Plan Note (Addendum)
Pap smear performed today. Declined flu vaccine. Discussed regular exercise and a well balanced diet. Discussed importance of smoking cessation. Patient is fasting, will get lipid panel today. Not due for mammogram or colonoscopy yet

## 2016-10-18 NOTE — Assessment & Plan Note (Signed)
Patient self discontinued amlodipine. BP at goal per JNC 8 guidelines.

## 2016-10-19 LAB — CERVICOVAGINAL ANCILLARY ONLY
Chlamydia: NEGATIVE
Neisseria Gonorrhea: NEGATIVE

## 2016-10-20 LAB — CYTOLOGY - PAP
Diagnosis: NEGATIVE
HPV: NOT DETECTED

## 2016-10-22 ENCOUNTER — Telehealth: Payer: Self-pay | Admitting: Family Medicine

## 2016-10-22 NOTE — Telephone Encounter (Signed)
Attempted to call patient to discuss lipid panel and potentially starting Lipitor. No answer at mobile number, home number out of service. Will attempt to call again at another time.  Archie Patten, MD [facility] Resident  10/22/2016, 8:44 AM

## 2016-11-16 ENCOUNTER — Telehealth: Payer: Self-pay | Admitting: Family Medicine

## 2016-11-16 MED ORDER — ROSUVASTATIN CALCIUM 20 MG PO TABS: 20.0000 mg | ORAL_TABLET | Freq: Every day | ORAL | 2 refills | Status: DC

## 2016-11-16 NOTE — Telephone Encounter (Signed)
Called to discuss lipid panel and was finally able to get through to patient.  If you consider her 36 y/o for ASCVD score, her 10 yr risk is 10.6%. Discussed lifestyle modifications including diet and exercise. Pt would like to work on changing lifestyle in addition to starting medication. Will start Crestor 20mg  daily.   Will repeat pap smear in 3 years with co-testing given patient has a h/o abnormal pap smears in the past.  Archie Patten, MD [place] Resident

## 2017-03-11 ENCOUNTER — Emergency Department (HOSPITAL_COMMUNITY): Payer: Medicaid Other

## 2017-03-11 ENCOUNTER — Emergency Department (HOSPITAL_BASED_OUTPATIENT_CLINIC_OR_DEPARTMENT_OTHER)
Admit: 2017-03-11 | Discharge: 2017-03-11 | Disposition: A | Discharge status: Home / Self Care | Payer: Medicaid Other | Attending: Emergency Medicine | Admitting: Emergency Medicine

## 2017-03-11 ENCOUNTER — Emergency Department (HOSPITAL_COMMUNITY)
Admission: EM | Admit: 2017-03-11 | Discharge: 2017-03-11 | Disposition: A | Discharge status: Home / Self Care | Payer: Medicaid Other | Attending: Emergency Medicine | Admitting: Emergency Medicine

## 2017-03-11 ENCOUNTER — Encounter (HOSPITAL_COMMUNITY): Payer: Self-pay | Admitting: Emergency Medicine

## 2017-03-11 DIAGNOSIS — F17210 Nicotine dependence, cigarettes, uncomplicated: Secondary | ICD-10-CM | POA: Insufficient documentation

## 2017-03-11 DIAGNOSIS — R0602 Shortness of breath: Secondary | ICD-10-CM

## 2017-03-11 DIAGNOSIS — M7989 Other specified soft tissue disorders: Secondary | ICD-10-CM | POA: Diagnosis present

## 2017-03-11 DIAGNOSIS — G9009 Other idiopathic peripheral autonomic neuropathy: Secondary | ICD-10-CM | POA: Diagnosis not present

## 2017-03-11 DIAGNOSIS — I10 Essential (primary) hypertension: Secondary | ICD-10-CM | POA: Insufficient documentation

## 2017-03-11 DIAGNOSIS — M79609 Pain in unspecified limb: Secondary | ICD-10-CM

## 2017-03-11 DIAGNOSIS — G609 Hereditary and idiopathic neuropathy, unspecified: Secondary | ICD-10-CM

## 2017-03-11 LAB — CBC WITH DIFFERENTIAL/PLATELET
Basophils Absolute: 0 10*3/uL (ref 0.0–0.1)
Basophils Relative: 0 %
Eosinophils Absolute: 0.6 10*3/uL (ref 0.0–0.7)
Eosinophils Relative: 6 %
HCT: 38.3 % (ref 36.0–46.0)
Hemoglobin: 12 g/dL (ref 12.0–15.0)
Lymphocytes Relative: 28 %
Lymphs Abs: 3 10*3/uL (ref 0.7–4.0)
MCH: 22.8 pg — ABNORMAL LOW (ref 26.0–34.0)
MCHC: 31.3 g/dL (ref 30.0–36.0)
MCV: 72.7 fL — ABNORMAL LOW (ref 78.0–100.0)
Monocytes Absolute: 0.5 10*3/uL (ref 0.1–1.0)
Monocytes Relative: 5 %
Neutro Abs: 6.3 10*3/uL (ref 1.7–7.7)
Neutrophils Relative %: 61 %
Platelets: 325 10*3/uL (ref 150–400)
RBC: 5.27 MIL/uL — ABNORMAL HIGH (ref 3.87–5.11)
RDW: 16.6 % — ABNORMAL HIGH (ref 11.5–15.5)
WBC: 10.4 10*3/uL (ref 4.0–10.5)

## 2017-03-11 LAB — URINALYSIS, ROUTINE W REFLEX MICROSCOPIC
Bilirubin Urine: NEGATIVE
Glucose, UA: NEGATIVE mg/dL
Hgb urine dipstick: NEGATIVE
Ketones, ur: NEGATIVE mg/dL
Leukocytes, UA: NEGATIVE
Nitrite: NEGATIVE
Protein, ur: NEGATIVE mg/dL
Specific Gravity, Urine: 1.015 (ref 1.005–1.030)
pH: 5 (ref 5.0–8.0)

## 2017-03-11 LAB — COMPREHENSIVE METABOLIC PANEL
ALT: 19 U/L (ref 14–54)
AST: 20 U/L (ref 15–41)
Albumin: 4 g/dL (ref 3.5–5.0)
Alkaline Phosphatase: 61 U/L (ref 38–126)
Anion gap: 9 (ref 5–15)
BUN: 8 mg/dL (ref 6–20)
CO2: 20 mmol/L — ABNORMAL LOW (ref 22–32)
Calcium: 9 mg/dL (ref 8.9–10.3)
Chloride: 106 mmol/L (ref 101–111)
Creatinine, Ser: 0.55 mg/dL (ref 0.44–1.00)
GFR calc Af Amer: >60 mL/min (ref >60)
GFR calc non Af Amer: >60 mL/min (ref >60)
Glucose, Bld: 104 mg/dL — ABNORMAL HIGH (ref 65–99)
Potassium: 4 mmol/L (ref 3.5–5.1)
Sodium: 135 mmol/L (ref 135–145)
Total Bilirubin: 0.5 mg/dL (ref 0.3–1.2)
Total Protein: 7.3 g/dL (ref 6.5–8.1)

## 2017-03-11 LAB — I-STAT TROPONIN, ED: Troponin i, poc: 0 ng/mL (ref 0.00–0.08)

## 2017-03-11 LAB — BRAIN NATRIURETIC PEPTIDE: B Natriuretic Peptide: 24.4 pg/mL (ref 0.0–100.0)

## 2017-03-11 LAB — PREGNANCY, URINE: Preg Test, Ur: NEGATIVE

## 2017-03-11 LAB — MAGNESIUM: Magnesium: 1.8 mg/dL (ref 1.7–2.4)

## 2017-03-11 MED ORDER — GABAPENTIN 100 MG PO CAPS: 100.0000 mg | ORAL_CAPSULE | Freq: Three times a day (TID) | ORAL | 0 refills | Status: DC

## 2017-03-11 NOTE — ED Triage Notes (Signed)
Pt reports she was on consentix for psoriasis, stopped med about 3-4 months and is now having foot and lower leg pain and swelling. Reports some sob as well at times, resp e/u, nad.

## 2017-03-11 NOTE — ED Notes (Signed)
Vascular US at bedside.

## 2017-03-11 NOTE — ED Notes (Signed)
ED Provider at bedside. 

## 2017-03-11 NOTE — ED Provider Notes (Signed)
[facility] DEPT Provider Note   CSN: [id number] Arrival date & time: 03/11/17  0840     History   Chief Complaint Chief Complaint  Patient presents with  . Leg Swelling    HPI [name] is a 36 y.o. female.  HPI Patient presents with 3-4 months of bilateral lower extremity swelling and pain. Describes the pain as like pins and needles. Pain extends from the bottom of her feet to her hips. She has ongoing low back pain which is unchanged. Denies urinary symptoms including dysuria, difficulty urinating, frequency, urgency, incontinence or hematuria. No flank pain, fever or chills. She has some mild exertional shortness of breath but denies chest pain. No numbness or focal weakness. Past Medical History:  Diagnosis Date  . Abdominal lipoma 05/2015  . GERD (gastroesophageal reflux disease)   . History of MRSA infection ~ 2006   leg  . Hypertension    under control with med., has been on med. x 6 mos.  . Psoriasis     Patient Active Problem List   Diagnosis Date Noted  . Health care maintenance 10/18/2016  . Sinusitis 10/18/2016  . Low back pain 07/23/2015  . Right flank discomfort 03/06/2015  . Dysphagia 01/03/2015  . Postoperative state 07/10/2014  . Dizzy spells 10/01/2013  . Psoriasis 10/01/2013  . Shingles 08/22/2013  . Labial abscess 07/09/2013  . Hypertension 09/26/2012  . Obesity 09/26/2012  . Subcutaneous nodule 09/26/2012  . History of abnormal Pap smear 09/26/2012    Past Surgical History:  Procedure Laterality Date  . CESAREAN SECTION  06/21/2008; 08/18/2010  . CESAREAN SECTION N/A 07/10/2014   Procedure: REPEAT CESAREAN SECTION;  Surgeon: Daria Pastures, MD;  Location: [facility];  Service: Obstetrics;  Laterality: N/A;  . CHOLECYSTECTOMY  07/10/2008  . LIPOMA EXCISION  2014   back  . LIPOMA EXCISION N/A 05/21/2015   Procedure: EXCISION LIPOMAS ON ABDOMEN;  Surgeon: Erroll Luna, MD;  Location: [facility];  Service: General;   Laterality: N/A;    OB History    Gravida Para Term Preterm AB Living   8 3 3   5 3    SAB TAB Ectopic Multiple Live Births   5       3       Home Medications    Prior to Admission medications   Medication Sig Start Date End Date Taking? Authorizing Provider  acetaminophen (TYLENOL) 500 MG tablet Take 500 mg by mouth every 6 (six) hours as needed for mild pain.   Yes [provider]  rosuvastatin (CRESTOR) 20 MG tablet Take 1 tablet (20 mg total) by mouth daily. 11/16/16  Yes Archie Patten, MD  azithromycin (ZITHROMAX) 250 MG tablet Take 500mg  (2 tablets) on the first day, then 250mg  (1 tablet) for the next 4 days Patient not taking: Reported on 03/11/2017 10/18/16   Archie Patten, MD  gabapentin (NEURONTIN) 100 MG capsule Take 1 capsule (100 mg total) by mouth 3 (three) times daily. 03/11/17   Julianne Rice, MD    Family History Family History  Problem Relation Age of Onset  . Drug abuse Mother   . Heart disease Maternal Grandmother   . Heart disease Maternal Grandfather     Social History Social History  Substance Use Topics  . Smoking status: Current Every Day Smoker    Packs/day: 0.50    Types: Cigarettes    Start date: 05/01/2014  . Smokeless tobacco: Never Used  . Alcohol use No  Allergies   Bee venom; Penicillins; Mushroom extract complex; and Powder   Review of Systems Review of Systems  Constitutional: Negative for chills and fever.  Respiratory: Positive for shortness of breath. Negative for wheezing.   Cardiovascular: Positive for leg swelling. Negative for chest pain and palpitations.  Gastrointestinal: Negative for abdominal pain, constipation, diarrhea, nausea and vomiting.  Genitourinary: Positive for vaginal bleeding. Negative for dysuria, flank pain, hematuria and pelvic pain.  Musculoskeletal: Positive for back pain and myalgias. Negative for gait problem, neck pain and neck stiffness.  Skin: Negative for rash and wound.    Neurological: Negative for dizziness, weakness, light-headedness, numbness and headaches.  All other systems reviewed and are negative.    Physical Exam Updated Vital Signs BP (!) 156/95   Pulse 84   Temp 98.1 F (36.7 C) (Oral)   Resp 12   SpO2 100%   Physical Exam  Constitutional: She is oriented to person, place, and time. She appears well-developed and well-nourished. No distress.  HENT:  Head: Normocephalic and atraumatic.  Mouth/Throat: Oropharynx is clear and moist. No oropharyngeal exudate.  Eyes: EOM are normal. Pupils are equal, round, and reactive to light.  Neck: Normal range of motion. Neck supple.  Cardiovascular: Normal rate and regular rhythm.  Exam reveals no gallop and no friction rub.   No murmur heard. Pulmonary/Chest: Effort normal and breath sounds normal. No respiratory distress. She has no wheezes. She has no rales. She exhibits no tenderness.  Abdominal: Soft. Bowel sounds are normal. She exhibits no distension. There is no tenderness. There is no rebound and no guarding.  Musculoskeletal: Normal range of motion. She exhibits tenderness. She exhibits no edema.  Patient has midline lumbar tenderness to palpation. Bilateral lumbar paraspinal tenderness. Distended varicose veins right greater than left lower extremities. Mild tenderness over the calves bilaterally. She has tenderness to palpation of the dorsal and plantar surfaces of both feet. Full range of motion of the hips, knees and ankles bilaterally without pain. No obvious joint swelling, erythema or warmth. 2+ dorsalis pedis and posterior tibial pulses bilaterally.  Neurological: She is alert and oriented to person, place, and time.  5/5 motor in all extremities. Sensation to light touch is intact though paresthesias to her lower extremities extending up past the knee are present. Ambulance without difficulty.  Skin: Skin is warm and dry. Capillary refill takes less than 2 seconds. No rash noted. No  erythema.  Psychiatric: She has a normal mood and affect. Her behavior is normal.  Nursing note and vitals reviewed.    ED Treatments / Results  Labs (all labs ordered are listed, but only abnormal results are displayed) Labs Reviewed  CBC WITH DIFFERENTIAL/PLATELET - Abnormal; Notable for the following:       Result Value   RBC 5.27 (*)    MCV 72.7 (*)    MCH 22.8 (*)    RDW 16.6 (*)    All other components within normal limits  COMPREHENSIVE METABOLIC PANEL - Abnormal; Notable for the following:    CO2 20 (*)    Glucose, Bld 104 (*)    All other components within normal limits  BRAIN NATRIURETIC PEPTIDE  URINALYSIS, ROUTINE W REFLEX MICROSCOPIC  PREGNANCY, URINE  MAGNESIUM  I-STAT TROPOININ, ED    EKG  EKG Interpretation None       Radiology Dg Chest 2 View  Result Date: 03/11/2017 CLINICAL DATA:  Lower extremity swelling for several months EXAM: CHEST  2 VIEW COMPARISON:  07/08/2008 FINDINGS: The  heart size and mediastinal contours are within normal limits. Both lungs are clear. The visualized skeletal structures are unremarkable. IMPRESSION: No active cardiopulmonary disease. Electronically Signed   By: Inez Catalina M.D.   On: 03/11/2017 10:05    Procedures Procedures (including critical care time)  Medications Ordered in ED Medications - No data to display   Initial Impression / Assessment and Plan / ED Course  I have reviewed the triage vital signs and the nursing notes.  Pertinent labs & imaging results that were available during my care of the patient were reviewed by me and considered in my medical decision making (see chart for details).    Patient with likely bilateral radicular pain related to her low back pain. Will screen for CHF, DVT and electrolyte abnormalities. Likely will need to follow-up with her primary physician.  No evidence of CHF, DVT or laboratory abnormalities to explain the patient's symptoms. Starting gabapentin and advised  follow-up with her primary physician. May need outpatient MRI. Return precautions given. Final Clinical Impressions(s) / ED Diagnoses   Final diagnoses:  Peripheral neuropathy, idiopathic    New Prescriptions New Prescriptions   GABAPENTIN (NEURONTIN) 100 MG CAPSULE    Take 1 capsule (100 mg total) by mouth 3 (three) times daily.     Julianne Rice, MD 03/11/17 1101

## 2017-03-11 NOTE — ED Notes (Signed)
Patient aware that a urine sample is needed. Pt. Will notify staff when able to provide.

## 2017-03-11 NOTE — ED Notes (Signed)
Patient returned from X-ray 

## 2017-03-11 NOTE — ED Notes (Signed)
Patient transported to X-ray 

## 2017-03-11 NOTE — Progress Notes (Signed)
Preliminary results by tech - Venous Duplex Lower Ext. Completed. Negative for deep and superficial vein thrombosis in both legs.  Nigil Braman, BS, RDMS, RVT  

## 2017-03-11 NOTE — ED Notes (Signed)
Pt getting dressed and awaiting discharge paperwork. 

## 2017-03-23 ENCOUNTER — Ambulatory Visit (INDEPENDENT_AMBULATORY_CARE_PROVIDER_SITE_OTHER): Payer: Medicaid Other | Admitting: Family Medicine

## 2017-03-23 ENCOUNTER — Encounter: Payer: Self-pay | Admitting: Family Medicine

## 2017-03-23 VITALS — BP 148/86 | HR 88 | Temp 98.3°F | Ht 66.0 in | Wt 198.8 lb

## 2017-03-23 DIAGNOSIS — G629 Polyneuropathy, unspecified: Secondary | ICD-10-CM | POA: Diagnosis present

## 2017-03-23 MED ORDER — GABAPENTIN 300 MG PO CAPS: 300.0000 mg | ORAL_CAPSULE | Freq: Three times a day (TID) | ORAL | 1 refills | Status: DC

## 2017-03-23 MED ORDER — KETOROLAC TROMETHAMINE 60 MG/2ML IM SOLN
60.0000 mg | Freq: Once | INTRAMUSCULAR | Status: AC
  Administered 2017-03-23: 60 mg via INTRAMUSCULAR

## 2017-03-23 MED ORDER — METHYLPREDNISOLONE ACETATE 40 MG/ML IJ SUSP
40.0000 mg | Freq: Once | INTRAMUSCULAR | Status: AC
  Administered 2017-03-23: 40 mg via INTRAMUSCULAR

## 2017-03-23 NOTE — Progress Notes (Signed)
Subjective: CC: ED follow up HPI: Patient is a 36 y.o. 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  ED visit on 5/11 for LE swelling and pain described as pins and needles from the soles of the feet up to hips. EDP had concerns for radicular pain, started on gabapentin 100mg  TID.  She notes severe pain (burning- like "fire in her veins") started 3 months ago (about the time she stopped taking cosentyx). Burning started in legs on anterolateral thighs, then went down to the soles of her feet. She notes pain does not seem to radiate, it is only on parts of her thighs, knees, and feet but spares the shin/calves. Doesn't feel back pain is too bad- has had worse pain in the past. Typically in the lumbar region, midline/ paraspinal muscles bilaterally. Notes weakness, thinks it may be due to pain. No bowel/bladder incontinence.  No recent illnesses.   She feels gabapentin seems to be helping. Denies drowsiness (she's even taken 2 at one time without drowsiness). No h/o DM. Never hand anything like this before. No rashes noted. No new medications.  Social History: currently smoker   ROS: All other systems reviewed and are negative.  Past Medical History Patient Active Problem List   Diagnosis Date Noted  . Neuropathy 03/23/2017  . Health care maintenance 10/18/2016  . Sinusitis 10/18/2016  . Low back pain 07/23/2015  . Right flank discomfort 03/06/2015  . Dysphagia 01/03/2015  . Postoperative state 07/10/2014  . Dizzy spells 10/01/2013  . Psoriasis 10/01/2013  . Shingles 08/22/2013  . Labial abscess 07/09/2013  . Hypertension 09/26/2012  . Obesity 09/26/2012  . Subcutaneous nodule 09/26/2012  . History of abnormal Pap smear 09/26/2012    Medications- reviewed and updated Current Outpatient Prescriptions  Medication Sig Dispense Refill  . acetaminophen (TYLENOL) 500 MG tablet Take 500 mg  by mouth every 6 (six) hours as needed for mild pain.    Marland Kitchen azithromycin (ZITHROMAX) 250 MG tablet Take 500mg  (2 tablets) on the first day, then 250mg  (1 tablet) for the next 4 days (Patient not taking: Reported on 03/11/2017) 6 each 0  . gabapentin (NEURONTIN) 300 MG capsule Take 1 capsule (300 mg total) by mouth 3 (three) times daily. 90 capsule 1  . rosuvastatin (CRESTOR) 20 MG tablet Take 1 tablet (20 mg total) by mouth daily. 90 tablet 2   No current facility-administered medications for this visit.     Objective: Office vital signs reviewed. BP (!) 148/86   Pulse 88   Temp 98.3 F (36.8 C) (Oral)   Ht 5\' 6"  (1.676 m)   Wt 198 lb 12.8 oz (90.2 kg)   SpO2 98%   BMI 32.09 kg/m    Physical Examination:  General: Awake, alert, well- nourished, NAD Back: normal to inspection. No tenderness to palpation. Some pain with full flexion, no pain with extension. Normal rotation. 5/5 strength in the LEs bilaterally. Sensation intact. 1/4 patellar and Achilles DTRs, symmetric. Good pulses distally. Negative SLR.   LEs: Significant pain with palpation over the dorsal and plantar aspects of the feet bilaterally.  No tenderness to shins or calves. Large varicose veins noted around the knees bilaterally R>L, however pain is not specifically limited to those areas.   Normal ambulation without assistance.   Assessment/Plan: Neuropathy Unknown etiology. No h/o DM. Has a h/o lumbar back pain but does not seem  to be originating in the back. Seems to have some benefit from gabapentin. No red flags on exam (bowel/bladder incontinence, weakness, or change in reflexes). - trial of Toradol and DepoMedrol today in clinic  - will increase gabapentin to 300mg  TID - pt referred to neurology for possible nerve conduction studies given her odd presentation. - f/u in 2-4 weeks or sooner as needed.     Orders Placed This Encounter  Procedures  . Ambulatory referral to Neurology    Referral Priority:   Routine     Referral Type:   Consultation    Referral Reason:   Specialty Services Required    Requested Specialty:   Neurology    Number of Visits Requested:   1    Meds ordered this encounter  Medications  . ketorolac (TORADOL) injection 60 mg  . methylPREDNISolone acetate (DEPO-MEDROL) injection 40 mg  . gabapentin (NEURONTIN) 300 MG capsule    Sig: Take 1 capsule (300 mg total) by mouth 3 (three) times daily.    Dispense:  90 capsule    Refill:  [place] PGY-3, [place]

## 2017-03-23 NOTE — Assessment & Plan Note (Signed)
Unknown etiology. No h/o DM. Has a h/o lumbar back pain but does not seem to be originating in the back. Seems to have some benefit from gabapentin. No red flags on exam (bowel/bladder incontinence, weakness, or change in reflexes). - trial of Toradol and DepoMedrol today in clinic  - will increase gabapentin to 300mg  TID - pt referred to neurology for possible nerve conduction studies given her odd presentation. - f/u in 2-4 weeks or sooner as needed.

## 2017-03-23 NOTE — Patient Instructions (Signed)
It was good to see you again.  You received both a steroid injection and an anti-inflammatory injection today.  Increase your gabapentin to 300mg  (3 capsules) tonight, if you tolerate this well fill the new prescription I sent in for 300mg  three times a day.  If you note drowsiness with the 300mg  of gabapentin, let me know and I'll send in a lower dose of the gabapentin.  I have referred you to neurology.  Follow up with me in 4 weeks or sooner as needed.

## 2017-05-03 ENCOUNTER — Encounter (INDEPENDENT_AMBULATORY_CARE_PROVIDER_SITE_OTHER): Payer: Self-pay

## 2017-05-03 ENCOUNTER — Encounter: Payer: Self-pay | Admitting: Diagnostic Neuroimaging

## 2017-05-03 ENCOUNTER — Ambulatory Visit (INDEPENDENT_AMBULATORY_CARE_PROVIDER_SITE_OTHER): Payer: Medicaid Other | Admitting: Diagnostic Neuroimaging

## 2017-05-03 VITALS — BP 140/91 | HR 80 | Ht 66.0 in | Wt 197.2 lb

## 2017-05-03 DIAGNOSIS — G629 Polyneuropathy, unspecified: Secondary | ICD-10-CM | POA: Diagnosis not present

## 2017-05-03 DIAGNOSIS — M79601 Pain in right arm: Secondary | ICD-10-CM

## 2017-05-03 DIAGNOSIS — M79604 Pain in right leg: Secondary | ICD-10-CM

## 2017-05-03 DIAGNOSIS — M79602 Pain in left arm: Secondary | ICD-10-CM

## 2017-05-03 DIAGNOSIS — M79605 Pain in left leg: Secondary | ICD-10-CM

## 2017-05-03 MED ORDER — GABAPENTIN 800 MG PO TABS: 800.0000 mg | ORAL_TABLET | Freq: Three times a day (TID) | ORAL | 6 refills | Status: DC

## 2017-05-03 NOTE — Progress Notes (Signed)
[facility]  PATIENT: [name] DOB: 06-28-1981  REFERRING CLINICIAN: Synetta Shadow, MD HISTORY FROM: patient  REASON FOR VISIT: new consult    HISTORICAL  CHIEF COMPLAINT:  Chief Complaint  Patient presents with  . NP Lorenso Courier  . Peripheral Neuropathy    been taking gabapentin 300-600 mg po tid, very slight improvement    HISTORY OF PRESENT ILLNESS:   36 year old female here for evaluation of pain, burning sensation, pins and needles sensation in her thighs and feet since March 2018. Just prior to onset of symptoms patient had been on injectable medication for psoriasis, but stopped this due to insurance reasons. Soon after patient started to have pain in legs and feet as above. Symptoms seem to spare her anterior tibial region. Patient also had some numbness and tingling in the toes. Patient does report some low back pain, hip pain as well. Some symptoms radiate from her low back to her legs. She has some mild intermittent numbness in her arms that is migratory.  No specific prodromal or triggering factors other than medication change as above.  Patient has tried gabapentin 300-600 mg 3 times per day without benefit.   REVIEW OF SYSTEMS: Full 14 system review of systems performed and negative with exception of: Swelling in legs blurred vision double vision memory loss confusion numbness weakness insomnia restless legs tremor anxiety not asleep racing thoughts feeling hot increased thirst itching.  ALLERGIES: Allergies  Allergen Reactions  . Bee Venom Anaphylaxis  . Penicillins Anaphylaxis  . Mushroom Extract Complex Nausea And Vomiting  . Powder Itching and Rash    HOME MEDICATIONS: Outpatient Medications Prior to Visit  Medication Sig Dispense Refill  . gabapentin (NEURONTIN) 300 MG capsule Take 1 capsule (300 mg total) by mouth 3 (three) times daily. 90 capsule 1  . rosuvastatin (CRESTOR) 20 MG tablet Take 1 tablet (20 mg total) by mouth daily. 90  tablet 2  . acetaminophen (TYLENOL) 500 MG tablet Take 500 mg by mouth every 6 (six) hours as needed for mild pain.    Marland Kitchen azithromycin (ZITHROMAX) 250 MG tablet Take 521m (2 tablets) on the first day, then 2578m(1 tablet) for the next 4 days (Patient not taking: Reported on 03/11/2017) 6 each 0   No facility-administered medications prior to visit.     PAST MEDICAL HISTORY: Past Medical History:  Diagnosis Date  . Abdominal lipoma 05/2015  . GERD (gastroesophageal reflux disease)   . History of MRSA infection ~ 2006   leg  . Hypertension    under control with med., has been on med. x 6 mos.  . Psoriasis     PAST SURGICAL HISTORY: Past Surgical History:  Procedure Laterality Date  . CESAREAN SECTION  06/21/2008; 08/18/2010  . CESAREAN SECTION N/A 07/10/2014   Procedure: REPEAT CESAREAN SECTION;  Surgeon: MiDaria PasturesMD;  [facility] [facility];  Service: Obstetrics;  Laterality: N/A;  . CHOLECYSTECTOMY  07/10/2008  . LIPOMA EXCISION  2014   back  . LIPOMA EXCISION N/A 05/21/2015   Procedure: EXCISION LIPOMAS ON ABDOMEN;  Surgeon: ThErroll LunaMD;  [facility] Service: General;  Laterality: N/A;    FAMILY HISTORY: Family History  Problem Relation Age of Onset  . Drug abuse Mother   . Heart disease Maternal Grandmother   . Heart disease Maternal Grandfather     SOCIAL HISTORY:  Social History   Social History  . Marital status: Married    Spouse name: N/A  .  Number of children: N/A  . Years of education: N/A   Occupational History  . Not on file.   Social History Main Topics  . Smoking status: Current Some Day Smoker    Packs/day: 0.50    Types: Cigarettes    Start date: 05/01/2014  . Smokeless tobacco: Never Used  . Alcohol use No  . Drug use: No  . Sexual activity: Yes    Birth control/ protection: None   Other Topics Concern  . Not on file   Social History Narrative   Live home with husband and 3 kids.   Education HS grad.   Culinary grad.  Caffeine  8 glasses (sweet tea), some soda's occ.  Working: [employer].       PHYSICAL EXAM  GENERAL EXAM/CONSTITUTIONAL: Vitals:  Vitals:   05/03/17 0852  BP: (!) 140/91  Pulse: 80  Weight: 197 lb 3.2 oz (89.4 kg)  Height: 5' 6" (1.676 m)     Body mass index is 31.83 kg/m.  Visual Acuity Screening   Right eye Left eye Both eyes  Without correction: 20/50 20/50   With correction:        Patient is in no distress; well developed, nourished and groomed; neck is supple  CARDIOVASCULAR:  Examination of carotid arteries is normal; no carotid bruits  Regular rate and rhythm, no murmurs  Examination of peripheral vascular system by observation and palpation is normal  EYES:  Ophthalmoscopic exam of optic discs and posterior segments is normal; no papilledema or hemorrhages  MUSCULOSKELETAL:  Gait, strength, tone, movements noted in Neurologic exam below  NEUROLOGIC: MENTAL STATUS:  No flowsheet data found.  awake, alert, oriented to person, place and time  recent and remote memory intact  normal attention and concentration  language fluent, comprehension intact, naming intact,   fund of knowledge appropriate  CRANIAL NERVE:   2nd - no papilledema on fundoscopic exam  2nd, 3rd, 4th, 6th - pupils equal and reactive to light, visual fields full to confrontation, extraocular muscles intact, no nystagmus  5th - facial sensation symmetric  7th - facial strength symmetric  8th - hearing intact  9th - palate elevates symmetrically, uvula midline  11th - shoulder shrug symmetric  12th - tongue protrusion midline  MOTOR:   normal bulk and tone  DECR STRENGTH IN DELTOIDS (4), HIP FLEXORS (3-4); LIMITED BY PAIN  OTHERWISE full strength in the BUE, BLE  SENSORY:   normal and symmetric to light touch  DECR TO PP, TEMP AND VIB IN FEET/ANKLE  COORDINATION:   finger-nose-finger, fine finger movements normal  REFLEXES:   deep tendon  reflexes --> BUE 2, KNEES 1, ANKLES TRACE  MUTE TOES ON PLANTAR STIM  GAIT/STATION:   ANTALGIC GAIT; romberg is negative    DIAGNOSTIC DATA (LABS, IMAGING, TESTING) - I reviewed patient records, labs, notes, testing and imaging myself where available.  Lab Results  Component Value Date   WBC 10.4 03/11/2017   HGB 12.0 03/11/2017   HCT 38.3 03/11/2017   MCV 72.7 (L) 03/11/2017   PLT 325 03/11/2017      Component Value Date/Time   NA 135 03/11/2017 0922   K 4.0 03/11/2017 0922   CL 106 03/11/2017 0922   CO2 20 (L) 03/11/2017 0922   GLUCOSE 104 (H) 03/11/2017 0922   BUN 8 03/11/2017 0922   CREATININE 0.55 03/11/2017 0922   CREATININE 0.60 10/18/2016 1123   CALCIUM 9.0 03/11/2017 0922   PROT 7.3 03/11/2017 0922   ALBUMIN 4.0  03/11/2017 0922   AST 20 03/11/2017 0922   ALT 19 03/11/2017 0922   ALKPHOS 61 03/11/2017 0922   BILITOT 0.5 03/11/2017 0922   GFRNONAA >60 03/11/2017 0922   GFRNONAA >89 10/18/2016 1123   GFRAA >60 03/11/2017 0922   GFRAA >89 10/18/2016 1123   Lab Results  Component Value Date   CHOL 168 10/18/2016   HDL 26 (L) 10/18/2016   LDLCALC 114 (H) 10/18/2016   TRIG 142 10/18/2016   CHOLHDL 6.5 (H) 10/18/2016   Lab Results  Component Value Date   HGBA1C 5.9 09/25/2012   No results found for: VITAMINB12 No results found for: TSH   07/08/15 xray lumbar  - Negative lumbar spine.     ASSESSMENT AND PLAN  36 y.o. year old female here with numbness, tingling, pain sensation in lower extremities, suspicious for neuropathy. Will check additional testing to rule out specific causes of neuropathy, lumbar spine etiologies, as well as central nervous system autoimmune or inflammatory conditions. Of note psoriasis has been associated with peripheral neuropathy.  Ddx: neuropathy (metabolic, psoriatic, autoimmune, inflamm, nutritional) vs polyradiculopathy (cervical and lumbar) vs CNS autoimmune/inflamm/demyelinating disease  1. Neuropathy   2. Pain in  both lower extremities   3. Pain in both upper extremities      PLAN: - neuropathy labs - MRI brain (rule out demyelinating disease) - MRI lumbar spine (rule out lumbar spinal stenosis; failed conservative mgmt x 6 weeks --> stretching, tylenol, steroid injection) - increase gabapentin to 832m three times per day  Orders Placed This Encounter  Procedures  . MR BRAIN W WO CONTRAST  . MR LUMBAR SPINE WO CONTRAST  . Hemoglobin A1c  . Vitamin B12  . TSH  . ANA w/Reflex if Positive  . Pan-ANCA  . Multiple Myeloma Panel (SPEP&IFE w/QIG)  . Angiotensin converting enzyme   Meds ordered this encounter  Medications  . gabapentin (NEURONTIN) 800 MG tablet    Sig: Take 1 tablet (800 mg total) by mouth 3 (three) times daily.    Dispense:  90 tablet    Refill:  6   Return in about 2 months (around 07/04/2017).    VPenni Bombard MD 74/11/3242 90:10AM Certified in Neurology, Neurophysiology and Neuroimaging  [facility] [address] [place] [phone]

## 2017-05-03 NOTE — Patient Instructions (Signed)
Thank you for coming to see [place] at [facility]. I hope we have been able to provide you high quality care today.  You may receive a patient satisfaction survey over the next few weeks. We would appreciate your feedback and comments so that we may continue to improve ourselves and the health of our patients.  - check labs  - check MRI brain   - check MRI lumbar spine   - increase gabapentin to 872m three times per day   ~~~~~~~~~~~~~~~~~~~~~~~~~~~~~~~~~~~~~~~~~~~~~~~~~~~~~~~~~~~~~~~~~  DR. PENUMALLI'S GUIDE TO HAPPY AND HEALTHY LIVING These are some of my general health and wellness recommendations. Some of them may apply to you better than others. Please use common sense as you try these suggestions and feel free to ask me any questions.   ACTIVITY/FITNESS Mental, social, emotional and physical stimulation are very important for brain and body health. Try learning a new activity (arts, music, language, sports, games).  Keep moving your body to the best of your abilities. You can do this at home, inside or outside, the park, community center, gym or anywhere you like. Consider a physical therapist or personal trainer to get started. Consider the app Sworkit. Fitness trackers such as smart-watches, smart-phones or Fitbits can help as well.   NUTRITION Eat more plants: colorful vegetables, nuts, seeds and berries.  Eat less sugar, salt, preservatives and processed foods.  Avoid toxins such as cigarettes and alcohol.  Drink water when you are thirsty. Warm water with a slice of lemon is an excellent morning drink to start the day.  Consider these websites for more information The Nutrition Source (hhttps://www.henry-hernandez.biz/ Prec[employer] Nutrition (wWindowBlog.ch   RELAXATION Consider practicing mindfulness meditation or other relaxation techniques such as deep breathing, prayer, yoga, tai chi, massage. See website  mindful.org or the apps Headspace or Calm to help get started.   SLEEP Try to get at least 7-8+ hours sleep per day. Regular exercise and reduced caffeine will help you sleep better. Practice good sleep hygeine techniques. See website sleep.org for more information.   PLANNING Prepare estate planning, living will, healthcare POA documents. Sometimes this is best planned with the help of an attorney. Theconversationproject.org and agingwithdignity.org are excellent resources.

## 2017-05-06 ENCOUNTER — Telehealth: Payer: Self-pay | Admitting: *Deleted

## 2017-05-06 LAB — PAN-ANCA
ANCA Proteinase 3: <3.5 U/mL (ref 0.0–3.5)
Atypical pANCA: <1:20 {titer}
C-ANCA: <1:20 {titer}
Myeloperoxidase Ab: <9 U/mL (ref 0.0–9.0)
P-ANCA: <1:20 {titer}

## 2017-05-06 LAB — MULTIPLE MYELOMA PANEL, SERUM
Albumin SerPl Elph-Mcnc: 3.7 g/dL (ref 2.9–4.4)
Albumin/Glob SerPl: 1.1 (ref 0.7–1.7)
Alpha 1: 0.2 g/dL (ref 0.0–0.4)
Alpha2 Glob SerPl Elph-Mcnc: 0.8 g/dL (ref 0.4–1.0)
B-Globulin SerPl Elph-Mcnc: 1.3 g/dL (ref 0.7–1.3)
Gamma Glob SerPl Elph-Mcnc: 1.2 g/dL (ref 0.4–1.8)
Globulin, Total: 3.5 g/dL (ref 2.2–3.9)
IgA/Immunoglobulin A, Serum: 235 mg/dL (ref 87–352)
IgG (Immunoglobin G), Serum: 1090 mg/dL (ref 700–1600)
IgM (Immunoglobulin M), Srm: 162 mg/dL (ref 26–217)
Total Protein: 7.2 g/dL (ref 6.0–8.5)

## 2017-05-06 LAB — HEMOGLOBIN A1C
Est. average glucose Bld gHb Est-mCnc: 137 mg/dL
Hgb A1c MFr Bld: 6.4 % — ABNORMAL HIGH (ref 4.8–5.6)

## 2017-05-06 LAB — VITAMIN B12: Vitamin B-12: <150 pg/mL — ABNORMAL LOW (ref 232–1245)

## 2017-05-06 LAB — ANGIOTENSIN CONVERTING ENZYME: Angio Convert Enzyme: 39 U/L (ref 14–82)

## 2017-05-06 LAB — ANA W/REFLEX IF POSITIVE: Anti Nuclear Antibody(ANA): NEGATIVE

## 2017-05-06 LAB — TSH: TSH: 1.71 u[IU]/mL (ref 0.450–4.500)

## 2017-05-06 NOTE — Telephone Encounter (Signed)
LMVM mobile to return call for lab results at her convenience.

## 2017-05-06 NOTE — Telephone Encounter (Signed)
-----   Message from Penni Bombard, MD sent at 05/05/2017  3:32 PM EDT ----- A1c is high, now mild diabetes. Also with very low B12. Both of these can cause numbness and burning in feet.  Recommend to start vitamin B12 supplement 1085mcg daily and follow up with PCP.  Recommend to follow up with PCP re: A1c = 6.4,  Please call patient.   -VRP

## 2017-05-11 NOTE — Telephone Encounter (Signed)
I called pt and LMVM on her mobile, called her husband and she was with him.  I gave her the lab results (A1c was high, now mild diabetes, also with low B12, both of these can cause numbess and burning in feet.  Recommended to start vit b12 supplement of 1033mcg daily and to f/u with Dr. Kathrine Cords , pcp at  [facility] for A1c.  She verbalized understanding.

## 2017-05-13 ENCOUNTER — Other Ambulatory Visit: Payer: Self-pay | Admitting: Diagnostic Neuroimaging

## 2017-05-16 ENCOUNTER — Encounter: Payer: Self-pay | Admitting: Internal Medicine

## 2017-05-16 ENCOUNTER — Ambulatory Visit (INDEPENDENT_AMBULATORY_CARE_PROVIDER_SITE_OTHER): Payer: Medicaid Other | Admitting: Internal Medicine

## 2017-05-16 VITALS — BP 136/88 | HR 80 | Temp 98.3°F | Wt 196.0 lb

## 2017-05-16 DIAGNOSIS — Z98891 History of uterine scar from previous surgery: Secondary | ICD-10-CM | POA: Diagnosis not present

## 2017-05-16 DIAGNOSIS — E119 Type 2 diabetes mellitus without complications: Secondary | ICD-10-CM | POA: Insufficient documentation

## 2017-05-16 DIAGNOSIS — R030 Elevated blood-pressure reading, without diagnosis of hypertension: Secondary | ICD-10-CM | POA: Insufficient documentation

## 2017-05-16 DIAGNOSIS — E538 Deficiency of other specified B group vitamins: Secondary | ICD-10-CM | POA: Diagnosis not present

## 2017-05-16 DIAGNOSIS — E1165 Type 2 diabetes mellitus with hyperglycemia: Secondary | ICD-10-CM | POA: Diagnosis not present

## 2017-05-16 MED ORDER — METFORMIN HCL 500 MG PO TABS: 500.0000 mg | ORAL_TABLET | Freq: Every day | ORAL | 3 refills | Status: DC

## 2017-05-16 NOTE — Progress Notes (Addendum)
   [name] Kerrin Mo, MD Phone: [phone]  Reason For Visit: New Diagnosis of Type 2 Diabetes   # New Diagnosis of Type 2 DM  Seen by neurology for neuropathic pain - found to have diabetes - A1C was 6.4 recently. Take Gabapentin for neuorpathic pain which helps some  Denies polyuria. Patient does in endorse some blurry vision and numbness or tingling.   # Blood pressure slightly elevated No medications Denies CP, dyspnea, HA, edema, dizziness / lightheadedness  Past Medical History Reviewed problem list.  Medications- reviewed and updated No additions to family history Social history- patient is a non-smoker  Objective: BP 136/88   Pulse 80   Temp 98.3 F (36.8 C) (Oral)   Wt 196 lb (88.9 kg)   BMI 31.64 kg/m  Gen: NAD, alert, cooperative with exam Cardio: regular rate and rhythm, S1S2 heard, no murmurs appreciated Pulm: clear to auscultation bilaterally, no wheezes, rhonchi or rales Skin: dry, intact, no rashes or lesions  Assessment/Plan: See problem based a/p  Diabetes type 2, controlled (HCC) Given patient with symptoms of neuropathic pain will start patient on metformin  - Ambulatory referral to Ophthalmology - Protein / creatinine ratio, urine - metFORMIN (GLUCOPHAGE) 500 MG tablet; Take 1 tablet (500 mg total) by mouth daily.  Dispense: 180 tablet; Refill: 3   Vitamin B12 deficiency Very low B12 levels, also contributing to patient's neuropathic pain. Concern for pernicious anemia. Normal hemoglobin levels  - Would also consider getting anti-intrinsic and anti-parietal levels  - if has PA consider initial endoscopy and to have a lower threshold for evaluating gastrointestinal symptoms in individuals with PA - Would consider getting folate at next visit  - Follow up in 1 month  - continue b12 supplementation    Elevated blood pressure reading Slight elevated given patient's diagnosis of diabetes  - Blood pressure check at  home  - Continue to follow- see patient in 1 month

## 2017-05-16 NOTE — Patient Instructions (Signed)
Please try to check your blood pressure 3 times a week. Goal blood pressure below less than 130/80. I am going start you on metformin and I want you to see a eye doctor.

## 2017-05-16 NOTE — Assessment & Plan Note (Signed)
Slight elevated given patient's diagnosis of diabetes  - Blood pressure check at home  - Continue to follow- see patient in 1 month

## 2017-05-16 NOTE — Assessment & Plan Note (Addendum)
Given patient with symptoms of neuropathic pain will start patient on metformin  - Ambulatory referral to Ophthalmology - Protein / creatinine ratio, urine - metFORMIN (GLUCOPHAGE) 500 MG tablet; Take 1 tablet (500 mg total) by mouth daily.  Dispense: 180 tablet; Refill: 3

## 2017-05-16 NOTE — Assessment & Plan Note (Addendum)
Very low B12 levels, also contributing to patient's neuropathic pain. Concern for pernicious anemia. Normal hemoglobin levels  - Would also consider getting anti-intrinsic and anti-parietal levels  - if has PA consider initial endoscopy and to have a lower threshold for evaluating gastrointestinal symptoms in individuals with PA - Would consider getting folate at next visit  - Follow up in 1 month  - continue b12 supplementation

## 2017-05-18 ENCOUNTER — Ambulatory Visit
Admission: RE | Admit: 2017-05-18 | Discharge: 2017-05-18 | Disposition: A | Discharge status: Home / Self Care | Payer: Medicaid Other | Source: Ambulatory Visit | Attending: Diagnostic Neuroimaging | Admitting: Diagnostic Neuroimaging

## 2017-05-18 DIAGNOSIS — M79601 Pain in right arm: Secondary | ICD-10-CM

## 2017-05-18 DIAGNOSIS — G629 Polyneuropathy, unspecified: Secondary | ICD-10-CM

## 2017-05-18 DIAGNOSIS — M79605 Pain in left leg: Secondary | ICD-10-CM

## 2017-05-18 DIAGNOSIS — M79604 Pain in right leg: Secondary | ICD-10-CM

## 2017-05-18 DIAGNOSIS — M79602 Pain in left arm: Secondary | ICD-10-CM

## 2017-05-19 LAB — PROTEIN / CREATININE RATIO, URINE
Creatinine, Urine: 277.3 mg/dL
Protein, Ur: 14 mg/dL
Protein/Creat Ratio: 50 mg/g creat (ref 0–200)

## 2017-05-31 ENCOUNTER — Ambulatory Visit
Admission: RE | Admit: 2017-05-31 | Discharge: 2017-05-31 | Disposition: A | Discharge status: Home / Self Care | Payer: Medicaid Other | Source: Ambulatory Visit | Attending: Diagnostic Neuroimaging | Admitting: Diagnostic Neuroimaging

## 2017-05-31 DIAGNOSIS — G629 Polyneuropathy, unspecified: Secondary | ICD-10-CM | POA: Diagnosis not present

## 2017-05-31 LAB — HM DIABETES EYE EXAM

## 2017-05-31 MED ORDER — GADOBENATE DIMEGLUMINE 529 MG/ML IV SOLN
18.0000 mL | Freq: Once | INTRAVENOUS | Status: AC | PRN
  Administered 2017-05-31: 18 mL via INTRAVENOUS

## 2017-06-03 ENCOUNTER — Telehealth: Payer: Self-pay | Admitting: *Deleted

## 2017-06-03 NOTE — Telephone Encounter (Signed)
Spoke with patient and informed her that her MRI brain results are unremarkable. Advised it is basically unchanged from MRI in 10/2013. Advised her that her MRI lumbar spine is also with unremarkable imaging results. Advised her there is some mild arthritis but no major pinched nerves.  Patient asked about her symptoms of burning, numbness of feet. This RN reviewed information re: lab results. Advised she continue taking Vit B12 and keep diabetes under good control to help with her feet issues. Advised her it is good news that her MRI brain and lumbar spine are basically normal. Patient verbalized understanding, appreciation.

## 2017-06-16 ENCOUNTER — Encounter (HOSPITAL_COMMUNITY): Payer: Self-pay | Admitting: Emergency Medicine

## 2017-06-16 ENCOUNTER — Emergency Department (HOSPITAL_COMMUNITY)
Admission: EM | Admit: 2017-06-16 | Discharge: 2017-06-17 | Disposition: A | Discharge status: Home / Self Care | Payer: Medicaid Other | Attending: Emergency Medicine | Admitting: Emergency Medicine

## 2017-06-16 DIAGNOSIS — E119 Type 2 diabetes mellitus without complications: Secondary | ICD-10-CM | POA: Diagnosis not present

## 2017-06-16 DIAGNOSIS — Z79899 Other long term (current) drug therapy: Secondary | ICD-10-CM | POA: Insufficient documentation

## 2017-06-16 DIAGNOSIS — I10 Essential (primary) hypertension: Secondary | ICD-10-CM | POA: Insufficient documentation

## 2017-06-16 DIAGNOSIS — R42 Dizziness and giddiness: Secondary | ICD-10-CM

## 2017-06-16 DIAGNOSIS — M79604 Pain in right leg: Secondary | ICD-10-CM | POA: Insufficient documentation

## 2017-06-16 DIAGNOSIS — M79605 Pain in left leg: Secondary | ICD-10-CM | POA: Insufficient documentation

## 2017-06-16 DIAGNOSIS — F17210 Nicotine dependence, cigarettes, uncomplicated: Secondary | ICD-10-CM | POA: Insufficient documentation

## 2017-06-16 NOTE — ED Triage Notes (Addendum)
Pt from home via EMS with c/o bilateral leg pain. Pt takes gabapentin at home for this. Pt also has c/o new onset dizziness that began around 2130. Pt's husband called EMS because pt began exhibiting strange behavior such as trying to take her clothes off. Pt and pt's husband deny any etoh or drugs. Pt is redirectable. Pt is alert and oriented x 4. Pt reports recent diagnosis of DM and new medications for this . Pt's cbg was [facility] for EMS

## 2017-06-17 ENCOUNTER — Emergency Department (HOSPITAL_COMMUNITY): Payer: Medicaid Other

## 2017-06-17 LAB — I-STAT CG4 LACTIC ACID, ED: Lactic Acid, Venous: 2.55 mmol/L (ref 0.5–1.9)

## 2017-06-17 LAB — CBC WITH DIFFERENTIAL/PLATELET
Basophils Absolute: 0.1 10*3/uL (ref 0.0–0.1)
Basophils Relative: 1 %
Eosinophils Absolute: 0.4 10*3/uL (ref 0.0–0.7)
Eosinophils Relative: 4 %
HCT: 36.4 % (ref 36.0–46.0)
Hemoglobin: 11.7 g/dL — ABNORMAL LOW (ref 12.0–15.0)
Lymphocytes Relative: 22 %
Lymphs Abs: 2.1 10*3/uL (ref 0.7–4.0)
MCH: 23.5 pg — ABNORMAL LOW (ref 26.0–34.0)
MCHC: 32.1 g/dL (ref 30.0–36.0)
MCV: 73.2 fL — ABNORMAL LOW (ref 78.0–100.0)
Monocytes Absolute: 0.6 10*3/uL (ref 0.1–1.0)
Monocytes Relative: 6 %
Neutro Abs: 6.7 10*3/uL (ref 1.7–7.7)
Neutrophils Relative %: 67 %
Platelets: 289 10*3/uL (ref 150–400)
RBC: 4.97 MIL/uL (ref 3.87–5.11)
RDW: 16.9 % — ABNORMAL HIGH (ref 11.5–15.5)
WBC: 9.8 10*3/uL (ref 4.0–10.5)

## 2017-06-17 LAB — HEPATIC FUNCTION PANEL
ALT: 22 U/L (ref 14–54)
AST: 24 U/L (ref 15–41)
Albumin: 4.2 g/dL (ref 3.5–5.0)
Alkaline Phosphatase: 55 U/L (ref 38–126)
Bilirubin, Direct: <0.1 mg/dL — ABNORMAL LOW (ref 0.1–0.5)
Total Bilirubin: 0.4 mg/dL (ref 0.3–1.2)
Total Protein: 7.4 g/dL (ref 6.5–8.1)

## 2017-06-17 LAB — BASIC METABOLIC PANEL
Anion gap: 10 (ref 5–15)
BUN: 8 mg/dL (ref 6–20)
CO2: 24 mmol/L (ref 22–32)
Calcium: 9.1 mg/dL (ref 8.9–10.3)
Chloride: 106 mmol/L (ref 101–111)
Creatinine, Ser: 0.6 mg/dL (ref 0.44–1.00)
GFR calc Af Amer: >60 mL/min (ref >60)
GFR calc non Af Amer: >60 mL/min (ref >60)
Glucose, Bld: 121 mg/dL — ABNORMAL HIGH (ref 65–99)
Potassium: 3.6 mmol/L (ref 3.5–5.1)
Sodium: 140 mmol/L (ref 135–145)

## 2017-06-17 LAB — I-STAT BETA HCG BLOOD, ED (MC, WL, AP ONLY): I-stat hCG, quantitative: <5 m[IU]/mL (ref <5)

## 2017-06-17 LAB — LIPASE, BLOOD: Lipase: 21 U/L (ref 11–51)

## 2017-06-17 LAB — ETHANOL: Alcohol, Ethyl (B): <5 mg/dL (ref <5)

## 2017-06-17 MED ORDER — DIPHENHYDRAMINE HCL 50 MG/ML IJ SOLN
25.0000 mg | Freq: Once | INTRAMUSCULAR | Status: AC
  Administered 2017-06-17: 25 mg via INTRAVENOUS
  Filled 2017-06-17: qty 1

## 2017-06-17 MED ORDER — ONDANSETRON 4 MG PO TBDP: ORAL_TABLET | ORAL | 0 refills | Status: DC

## 2017-06-17 MED ORDER — MECLIZINE HCL 25 MG PO TABS: 25.0000 mg | ORAL_TABLET | Freq: Three times a day (TID) | ORAL | 0 refills | Status: DC | PRN

## 2017-06-17 MED ORDER — PROCHLORPERAZINE EDISYLATE 5 MG/ML IJ SOLN
10.0000 mg | Freq: Once | INTRAMUSCULAR | Status: AC
  Administered 2017-06-17: 10 mg via INTRAVENOUS
  Filled 2017-06-17: qty 2

## 2017-06-17 MED ORDER — SODIUM CHLORIDE 0.9 % IV BOLUS (SEPSIS)
1000.0000 mL | Freq: Once | INTRAVENOUS | Status: AC
  Administered 2017-06-17: 1000 mL via INTRAVENOUS

## 2017-06-17 NOTE — Discharge Instructions (Signed)
Follow up with your neurologist.  Return for worsening symptoms.   

## 2017-06-17 NOTE — ED Notes (Signed)
Pt has a lactic of 2.55 EDP Tyrone Nine made aware.

## 2017-06-17 NOTE — ED Provider Notes (Signed)
[facility] DEPT Provider Note   CSN: [id number] Arrival date & time: 06/16/17  2319     History   Chief Complaint Chief Complaint  Patient presents with  . Dizziness  . Leg Pain    HPI Lynel Forester is a 36 y.o. female.  36 yo F with a chief complaint of dizziness. Patient has a difficult time explaining this. Has been going on for at least the past couple hours maybe longer. She also feels a bit nauseated. Feels more like the room spinning than not she'll pass out. Denies abdominal pain chest pain vomiting or diarrhea. Denies sick contacts. Has had some ringing in her ears. Also complaining of burning to bilateral legs. She has had a recent MRI of her brain and L-spine for this burning pain. It was negative at that time and thought to be diabetic neuropathy.   The history is provided by the patient.  Dizziness  Quality:  Room spinning Severity:  Severe Onset quality:  Sudden Duration:  2 hours Timing:  Constant Progression:  Unchanged Chronicity:  New Context comment:  Lying flat Relieved by:  Nothing Worsened by:  Nothing Ineffective treatments:  None tried Associated symptoms: no chest pain, no headaches, no nausea, no palpitations, no shortness of breath and no vomiting   Leg Pain      Past Medical History:  Diagnosis Date  . Abdominal lipoma 05/2015  . GERD (gastroesophageal reflux disease)   . History of MRSA infection ~ 2006   leg  . Hypertension    under control with med., has been on med. x 6 mos.  . Psoriasis     Patient Active Problem List   Diagnosis Date Noted  . Diabetes type 2, controlled ([place]) 05/16/2017  . H/O: C-section 05/16/2017  . Vitamin B12 deficiency 05/16/2017  . Elevated blood pressure reading 05/16/2017  . Neuropathy 03/23/2017  . Health care maintenance 10/18/2016  . Sinusitis 10/18/2016  . Low back pain 07/23/2015  . Right flank discomfort 03/06/2015  . Dysphagia 01/03/2015  . Postoperative state 07/10/2014  . Dizzy  spells 10/01/2013  . Psoriasis 10/01/2013  . Shingles 08/22/2013  . Labial abscess 07/09/2013  . Hypertension 09/26/2012  . Obesity 09/26/2012  . Subcutaneous nodule 09/26/2012  . History of abnormal Pap smear 09/26/2012    Past Surgical History:  Procedure Laterality Date  . CESAREAN SECTION  06/21/2008; 08/18/2010  . CESAREAN SECTION N/A 07/10/2014   Procedure: REPEAT CESAREAN SECTION;  Surgeon: Daria Pastures, MD;  Location: [facility];  Service: Obstetrics;  Laterality: N/A;  . CHOLECYSTECTOMY  07/10/2008  . LIPOMA EXCISION  2014   back  . LIPOMA EXCISION N/A 05/21/2015   Procedure: EXCISION LIPOMAS ON ABDOMEN;  Surgeon: Erroll Luna, MD;  Location: [facility];  Service: General;  Laterality: N/A;    OB History    Gravida Para Term Preterm AB Living   8 3 3   5 3    SAB TAB Ectopic Multiple Live Births   5       3       Home Medications    Prior to Admission medications   Medication Sig Start Date End Date Taking? Authorizing Provider  gabapentin (NEURONTIN) 800 MG tablet Take 1 tablet (800 mg total) by mouth 3 (three) times daily. 05/03/17  Yes Penumalli, Earlean Polka, MD  metFORMIN (GLUCOPHAGE) 500 MG tablet Take 1 tablet (500 mg total) by mouth daily. 05/16/17  Yes Mikell, Jeani Sow, MD  rosuvastatin (CRESTOR) 20 MG tablet  Take 1 tablet (20 mg total) by mouth daily. 11/16/16  Yes Archie Patten, MD  meclizine (ANTIVERT) 25 MG tablet Take 1 tablet (25 mg total) by mouth 3 (three) times daily as needed for dizziness. 06/17/17   Deno Etienne, DO  ondansetron (ZOFRAN ODT) 4 MG disintegrating tablet 4mg  ODT q4 hours prn nausea/vomit 06/17/17   Deno Etienne, DO    Family History Family History  Problem Relation Age of Onset  . Drug abuse Mother   . Heart disease Maternal Grandmother   . Heart disease Maternal Grandfather     Social History Social History  Substance Use Topics  . Smoking status: Current Some Day Smoker    Packs/day: 0.50    Types: Cigarettes      Start date: 05/01/2014  . Smokeless tobacco: Never Used  . Alcohol use No     Allergies   Bee venom; Penicillins; Mushroom extract complex; and Powder   Review of Systems Review of Systems  Constitutional: Negative for chills and fever.  HENT: Negative for congestion and rhinorrhea.   Eyes: Negative for redness and visual disturbance.  Respiratory: Negative for shortness of breath and wheezing.   Cardiovascular: Negative for chest pain and palpitations.  Gastrointestinal: Negative for nausea and vomiting.  Genitourinary: Negative for dysuria and urgency.  Musculoskeletal: Negative for arthralgias and myalgias.  Skin: Negative for pallor and wound.  Neurological: Positive for dizziness. Negative for headaches.     Physical Exam Updated Vital Signs BP 128/80 (BP Location: Left Arm)   Pulse (!) 54   Temp (!) 97.5 F (36.4 C) (Oral)   Resp 16   LMP 06/10/2017   SpO2 98%   Physical Exam  Constitutional: She is oriented to person, place, and time. She appears well-developed and well-nourished. No distress.  HENT:  Head: Normocephalic and atraumatic.  Eyes: Pupils are equal, round, and reactive to light. EOM are normal.  Neck: Normal range of motion. Neck supple.  Cardiovascular: Normal rate and regular rhythm.  Exam reveals no gallop and no friction rub.   No murmur heard. Pulmonary/Chest: Effort normal. She has no wheezes. She has no rales.  Abdominal: Soft. She exhibits no distension. There is no tenderness.  Musculoskeletal: She exhibits no edema or tenderness.  Neurological: She is alert and oriented to person, place, and time. No cranial nerve deficit or sensory deficit. Coordination normal. She displays no Babinski's sign on the right side. She displays no Babinski's sign on the left side.  Difficult to assess lower extremity strength due to patient compliance with exam  Skin: Skin is warm and dry. She is not diaphoretic.  Psychiatric: She has a normal mood and  affect. Her behavior is normal.  Nursing note and vitals reviewed.    ED Treatments / Results  Labs (all labs ordered are listed, but only abnormal results are displayed) Labs Reviewed  CBC WITH DIFFERENTIAL/PLATELET - Abnormal; Notable for the following:       Result Value   Hemoglobin 11.7 (*)    MCV 73.2 (*)    MCH 23.5 (*)    RDW 16.9 (*)    All other components within normal limits  BASIC METABOLIC PANEL - Abnormal; Notable for the following:    Glucose, Bld 121 (*)    All other components within normal limits  HEPATIC FUNCTION PANEL - Abnormal; Notable for the following:    Bilirubin, Direct <0.1 (*)    All other components within normal limits  I-STAT CG4 LACTIC ACID, ED -  Abnormal; Notable for the following:    Lactic Acid, Venous 2.55 (*)    All other components within normal limits  ETHANOL  LIPASE, BLOOD  RAPID URINE DRUG SCREEN, HOSP PERFORMED  URINALYSIS, ROUTINE W REFLEX MICROSCOPIC  I-STAT BETA HCG BLOOD, ED ([facility], [facility], [facility] ONLY)  I-STAT CG4 LACTIC ACID, ED    EKG  EKG Interpretation None       Radiology Ct Head Wo Contrast  Result Date: 06/17/2017 CLINICAL DATA:  Dizziness. EXAM: CT HEAD WITHOUT CONTRAST TECHNIQUE: Contiguous axial images were obtained from the base of the skull through the vertex without intravenous contrast. COMPARISON:  Brain MRI 05/31/2017 FINDINGS: Brain: No intracranial hemorrhage, mass effect, or midline shift. No hydrocephalus. The basilar cisterns are patent. No evidence of territorial infarct. No extra-axial or intracranial fluid collection. Vascular: No hyperdense vessel or unexpected calcification. Skull: Normal. Negative for fracture or focal lesion. Sinuses/Orbits: Minimal opacification of ethmoid air cells. Paranasal sinuses are otherwise clear. Mastoid air cells are well-aerated. Visualized orbits are unremarkable. Other: None. IMPRESSION: Unremarkable noncontrast head CT. Electronically Signed   By: Jeb Levering M.D.   On:  06/17/2017 01:14    Procedures Procedures (including critical care time)  Medications Ordered in ED Medications  sodium chloride 0.9 % bolus 1,000 mL (0 mLs Intravenous Stopped 06/17/17 0212)  prochlorperazine (COMPAZINE) injection 10 mg (10 mg Intravenous Given 06/17/17 0040)  diphenhydrAMINE (BENADRYL) injection 25 mg (25 mg Intravenous Given 06/17/17 0040)     Initial Impression / Assessment and Plan / ED Course  I have reviewed the triage vital signs and the nursing notes.  Pertinent labs & imaging results that were available during my care of the patient were reviewed by me and considered in my medical decision making (see chart for details).     35 yo F With a chief complaint of dizziness. The patient has difficulty explaining the sensation. She appears intoxicated on exam. His symptoms however spontaneously resolved when I became concerned that she is not complying with the neurologic exam. I am unsure of the etiology of the patient's symptoms though since they were transient feel that this could likely be worked up as an outpatient.  Feeling better post compazine and benadryl.  D/c home.   4:20 AM:  I have discussed the diagnosis/risks/treatment options with the patient and family and believe the pt to be eligible for discharge home to follow-up with PCP, neuro. We also discussed returning to the ED immediately if new or worsening sx occur. We discussed the sx which are most concerning (e.g., sudden worsening pain, fever, inability to tolerate by mouth) that necessitate immediate return. Medications administered to the patient during their visit and any new prescriptions provided to the patient are listed below.  Medications given during this visit Medications  sodium chloride 0.9 % bolus 1,000 mL (0 mLs Intravenous Stopped 06/17/17 0212)  prochlorperazine (COMPAZINE) injection 10 mg (10 mg Intravenous Given 06/17/17 0040)  diphenhydrAMINE (BENADRYL) injection 25 mg (25 mg  Intravenous Given 06/17/17 0040)     The patient appears reasonably screen and/or stabilized for discharge and I doubt any other medical condition or other [facility] requiring further screening, evaluation, or treatment in the ED at this time prior to discharge.   Final Clinical Impressions(s) / ED Diagnoses   Final diagnoses:  Dizziness    New Prescriptions Discharge Medication List as of 06/17/2017  1:54 AM    START taking these medications   Details  meclizine (ANTIVERT) 25 MG tablet Take 1  tablet (25 mg total) by mouth 3 (three) times daily as needed for dizziness., Starting Fri 06/17/2017, Print    ondansetron (ZOFRAN ODT) 4 MG disintegrating tablet 4mg  ODT q4 hours prn nausea/vomit, Print         Deno Etienne, DO 06/17/17 [phone]

## 2017-06-20 ENCOUNTER — Encounter: Payer: Self-pay | Admitting: Internal Medicine

## 2017-06-20 ENCOUNTER — Ambulatory Visit (INDEPENDENT_AMBULATORY_CARE_PROVIDER_SITE_OTHER): Payer: Medicaid Other | Admitting: Internal Medicine

## 2017-06-20 VITALS — BP 128/78 | HR 75 | Temp 98.5°F | Ht 66.0 in | Wt 193.4 lb

## 2017-06-20 DIAGNOSIS — E1142 Type 2 diabetes mellitus with diabetic polyneuropathy: Secondary | ICD-10-CM | POA: Diagnosis not present

## 2017-06-20 DIAGNOSIS — E538 Deficiency of other specified B group vitamins: Secondary | ICD-10-CM | POA: Diagnosis present

## 2017-06-20 DIAGNOSIS — E1165 Type 2 diabetes mellitus with hyperglycemia: Secondary | ICD-10-CM

## 2017-06-20 DIAGNOSIS — D649 Anemia, unspecified: Secondary | ICD-10-CM

## 2017-06-20 MED ORDER — METFORMIN HCL 500 MG PO TABS: 500.0000 mg | ORAL_TABLET | Freq: Two times a day (BID) | ORAL | 3 refills | Status: DC

## 2017-06-20 NOTE — Progress Notes (Deleted)
   [name] Kerrin Mo, MD Phone: [phone]  Reason For Visit:   # *** -   Past Medical History Reviewed problem list.  Medications- reviewed and updated No additions to family history Social history- patient is a *** smoker  Objective: BP 128/78   Pulse 75   Temp 98.5 F (36.9 C) (Oral)   Ht 5\' 6"  (1.676 m)   Wt 193 lb 6.4 oz (87.7 kg)   LMP 06/10/2017   SpO2 98%   BMI 31.22 kg/m  Gen: NAD, alert, cooperative with exam HEENT: Normal    Neck: No masses palpated. No lymphadenopathy    Ears: Tympanic membranes intact, normal light reflex, no erythema, no bulging    Eyes: PERRLA, EOMI    Nose: nasal turbinates moist    Throat: moist mucus membranes, no erythema Cardio: regular rate and rhythm, S1S2 heard, no murmurs appreciated Pulm: clear to auscultation bilaterally, no wheezes, rhonchi or rales GI: soft, non-tender, non-distended, bowel sounds present, no hepatomegaly, no splenomegaly GU: external vaginal tissue ***, cervix ***, *** punctate lesions on cervix appreciated, *** discharge from cervical os, *** bleeding, *** cervical motion tenderness, *** abdominal/ adnexal masses Extremities: warm, well perfused, No edema, cyanosis or clubbing;  MSK: Normal gait and station Skin: dry, intact, no rashes or lesions Neuro: Strength and sensation grossly intact   Assessment/Plan: See problem based a/p  No problem-specific Assessment & Plan notes found for this encounter.

## 2017-06-20 NOTE — Progress Notes (Signed)
   [name] Kerrin Mo, MD Phone: [phone]  Reason For Visit: Follow up  # Neuropathic pain, diabetes neuropathy - Patient continues to suffer from significant bilateral pain and tingling in her legs. She is being followed by neurology with negative head CT and MRI and negative lumbar spine -She continues to have severe and tingling in both her legs -Recently found to have an A1c of 6.4 started her on metformin 500 mg;  # CHRONIC DM, Type 2: Reports  no concerns, currently started on metformin  Patient was seen by eye doctor, whom states she has significant vision changes - patient is unsure of what specifically  Denies any side effects from the metformin Denies polyuria Endorses numbness or tingling.  # Anemia  - Recently had an episode of dizziness was seen in the ED for this but previously no history of dizziness, palpitations. Patient does fill fatigued  - No blood in stools -Currently still has menstrual periods.  Past Medical History Reviewed problem list.  Medications- reviewed and updated No additions to family history Social history- patient is a non-smoker  Objective: BP 128/78   Pulse 75   Temp 98.5 F (36.9 C) (Oral)   Ht 5\' 6"  (1.676 m)   Wt 193 lb 6.4 oz (87.7 kg)   LMP 06/10/2017   SpO2 98%   BMI 31.22 kg/m  Gen: NAD, alert, cooperative with exam Cardio: regular rate and rhythm, S1S2 heard, no murmurs appreciated Pulm: clear to auscultation bilaterally, no wheezes, rhonchi or rales Extremities: warm, well perfused, No edema, cyanosis or clubbing;  MSK: Normal gait and station   Assessment/Plan: See problem based a/p  Anemia Slight anemia, MCV low. Patient with a severely low B12 which showed results in an elevated at MCV. Therefore some concern that there is some iron deficiency anemia. Mentzer's index 14.7 therefore unlikely a hemoglobinopathy contributing. - Folate - Iron and TIBC - Ferritin   Diabetes type 2,  controlled (HCC) Neuropathic pain continues, following with neurology  Will increase metformin 500 mg BID  Recently seen for dehydration, encouraged fluid intake Follow up in 1 month    Vitamin B12 deficiency Due to concern about pernicious anemia will obtain antibody labs which will inform whether patient needs EGD or not  - Intrinsic Factor Antibodies - Anti-parietal antibody     Diabetic neuropathy ([place]) No much improved, neurology working up  Has been increasing gabapentin without much improvement

## 2017-06-20 NOTE — Patient Instructions (Signed)
I want you to continue taking vitamin B12, I'm going to test your body for levels of iron to see if you need that. I think that you were severely dehydrated on Friday and I recommended taking plenty of fluids every day. I want you to increase her metformin to twice daily and follow up with me in 1 month

## 2017-06-21 DIAGNOSIS — E1140 Type 2 diabetes mellitus with diabetic neuropathy, unspecified: Secondary | ICD-10-CM | POA: Insufficient documentation

## 2017-06-21 NOTE — Assessment & Plan Note (Signed)
No much improved, neurology working up  Has been increasing gabapentin without much improvement

## 2017-06-21 NOTE — Assessment & Plan Note (Addendum)
Slight anemia, MCV low. Patient with a severely low B12 which showed results in an elevated at MCV. Therefore some concern that there is some iron deficiency anemia. Mentzer's index 14.7 therefore unlikely a hemoglobinopathy contributing. - Folate - Iron and TIBC - Ferritin

## 2017-06-21 NOTE — Assessment & Plan Note (Signed)
Neuropathic pain continues, following with neurology  Will increase metformin 500 mg BID  Recently seen for dehydration, encouraged fluid intake Follow up in 1 month

## 2017-06-21 NOTE — Assessment & Plan Note (Signed)
Due to concern about pernicious anemia will obtain antibody labs which will inform whether patient needs EGD or not  - Intrinsic Factor Antibodies - Anti-parietal antibody

## 2017-06-22 LAB — IRON AND TIBC
Iron Saturation: 12 % — ABNORMAL LOW (ref 15–55)
Iron: 47 ug/dL (ref 27–159)
Total Iron Binding Capacity: 382 ug/dL (ref 250–450)
UIBC: 335 ug/dL (ref 131–425)

## 2017-06-22 LAB — ANTI-PARIETAL ANTIBODY: Parietal Cell Ab: 3.1 Units (ref 0.0–20.0)

## 2017-06-22 LAB — INTRINSIC FACTOR ANTIBODIES: Intrinsic Factor Abs, Serum: 1.1 AU/mL (ref 0.0–1.1)

## 2017-06-22 LAB — FERRITIN: Ferritin: 12 ng/mL — ABNORMAL LOW (ref 15–150)

## 2017-06-22 LAB — FOLATE: Folate: 8.4 ng/mL (ref >3.0)

## 2017-06-24 ENCOUNTER — Telehealth: Payer: Self-pay | Admitting: Internal Medicine

## 2017-06-24 DIAGNOSIS — D649 Anemia, unspecified: Secondary | ICD-10-CM

## 2017-06-24 MED ORDER — FERROUS SULFATE 325 (65 FE) MG PO TABS: 325.0000 mg | ORAL_TABLET | Freq: Every day | ORAL | 2 refills | Status: DC

## 2017-06-24 NOTE — Telephone Encounter (Signed)
Please call patient and let her know that her iron levels are low. She needs to start taking supplemental iron. I will place a order for this.  Thanks Alizon Schmeling

## 2017-06-28 NOTE — Telephone Encounter (Signed)
Called patient x 2, no answer, no voicemail. 

## 2017-07-05 ENCOUNTER — Ambulatory Visit: Payer: Medicaid Other | Admitting: Diagnostic Neuroimaging

## 2017-07-07 ENCOUNTER — Emergency Department (HOSPITAL_COMMUNITY)
Admission: EM | Admit: 2017-07-07 | Discharge: 2017-07-07 | Disposition: A | Discharge status: Home / Self Care | Payer: Medicaid Other | Attending: Emergency Medicine | Admitting: Emergency Medicine

## 2017-07-07 ENCOUNTER — Encounter (HOSPITAL_COMMUNITY): Payer: Self-pay | Admitting: *Deleted

## 2017-07-07 DIAGNOSIS — F17210 Nicotine dependence, cigarettes, uncomplicated: Secondary | ICD-10-CM | POA: Diagnosis not present

## 2017-07-07 DIAGNOSIS — E119 Type 2 diabetes mellitus without complications: Secondary | ICD-10-CM | POA: Insufficient documentation

## 2017-07-07 DIAGNOSIS — Z79899 Other long term (current) drug therapy: Secondary | ICD-10-CM | POA: Insufficient documentation

## 2017-07-07 DIAGNOSIS — Z7984 Long term (current) use of oral hypoglycemic drugs: Secondary | ICD-10-CM | POA: Diagnosis not present

## 2017-07-07 DIAGNOSIS — I10 Essential (primary) hypertension: Secondary | ICD-10-CM | POA: Insufficient documentation

## 2017-07-07 DIAGNOSIS — R05 Cough: Secondary | ICD-10-CM | POA: Diagnosis not present

## 2017-07-07 DIAGNOSIS — J0100 Acute maxillary sinusitis, unspecified: Secondary | ICD-10-CM

## 2017-07-07 DIAGNOSIS — R0981 Nasal congestion: Secondary | ICD-10-CM | POA: Diagnosis present

## 2017-07-07 MED ORDER — SULFAMETHOXAZOLE-TRIMETHOPRIM 800-160 MG PO TABS: 1.0000 | ORAL_TABLET | Freq: Two times a day (BID) | ORAL | 0 refills | Status: AC

## 2017-07-07 NOTE — ED Provider Notes (Signed)
[facility] DEPT Provider Note   CSN: [id number] Arrival date & time: 07/07/17  0725     History   Chief Complaint Chief Complaint  Patient presents with  . Nasal Congestion    HPI [name] is a 36 y.o. female.  The history is provided by the patient. No language interpreter was used.  Cough  This is a new problem. Episode onset: 4 days. The problem occurs constantly. The problem has been gradually worsening. The cough is productive of sputum. There has been no fever. Pertinent negatives include no chest pain. She has tried nothing for the symptoms. The treatment provided no relief. She is a smoker. Her past medical history is significant for bronchitis.    Past Medical History:  Diagnosis Date  . Abdominal lipoma 05/2015  . GERD (gastroesophageal reflux disease)   . History of MRSA infection ~ 2006   leg  . Hypertension    under control with med., has been on med. x 6 mos.  . Psoriasis     Patient Active Problem List   Diagnosis Date Noted  . Diabetic neuropathy ([place]) 06/21/2017  . Anemia 06/20/2017  . Diabetes type 2, controlled ([place]) 05/16/2017  . H/O: C-section 05/16/2017  . Vitamin B12 deficiency 05/16/2017  . Elevated blood pressure reading 05/16/2017  . Neuropathy 03/23/2017  . Health care maintenance 10/18/2016  . Sinusitis 10/18/2016  . Low back pain 07/23/2015  . Right flank discomfort 03/06/2015  . Dysphagia 01/03/2015  . Postoperative state 07/10/2014  . Dizzy spells 10/01/2013  . Psoriasis 10/01/2013  . Shingles 08/22/2013  . Labial abscess 07/09/2013  . Hypertension 09/26/2012  . Obesity 09/26/2012  . Subcutaneous nodule 09/26/2012  . History of abnormal Pap smear 09/26/2012    Past Surgical History:  Procedure Laterality Date  . CESAREAN SECTION  06/21/2008; 08/18/2010  . CESAREAN SECTION N/A 07/10/2014   Procedure: REPEAT CESAREAN SECTION;  Surgeon: Daria Pastures, MD;  Location: [facility];  Service: Obstetrics;  Laterality: N/A;  .  CHOLECYSTECTOMY  07/10/2008  . LIPOMA EXCISION  2014   back  . LIPOMA EXCISION N/A 05/21/2015   Procedure: EXCISION LIPOMAS ON ABDOMEN;  Surgeon: Erroll Luna, MD;  Location: [facility];  Service: General;  Laterality: N/A;    OB History    Gravida Para Term Preterm AB Living   8 3 3   5 3    SAB TAB Ectopic Multiple Live Births   5       3       Home Medications    Prior to Admission medications   Medication Sig Start Date End Date Taking? Authorizing Provider  ferrous sulfate 325 (65 FE) MG tablet Take 1 tablet (325 mg total) by mouth daily. 06/24/17   Mikell, Jeani Sow, MD  gabapentin (NEURONTIN) 800 MG tablet Take 1 tablet (800 mg total) by mouth 3 (three) times daily. 05/03/17   Penumalli, Earlean Polka, MD  meclizine (ANTIVERT) 25 MG tablet Take 1 tablet (25 mg total) by mouth 3 (three) times daily as needed for dizziness. 06/17/17   Deno Etienne, DO  metFORMIN (GLUCOPHAGE) 500 MG tablet Take 1 tablet (500 mg total) by mouth 2 (two) times daily with a meal. 06/20/17   Mikell, Jeani Sow, MD  ondansetron (ZOFRAN ODT) 4 MG disintegrating tablet 4mg  ODT q4 hours prn nausea/vomit 06/17/17   Deno Etienne, DO  rosuvastatin (CRESTOR) 20 MG tablet Take 1 tablet (20 mg total) by mouth daily. 11/16/16   Archie Patten, MD  sulfamethoxazole-trimethoprim (BACTRIM DS,SEPTRA DS) 800-160 MG tablet Take 1 tablet by mouth 2 (two) times daily. 07/07/17 07/14/17  Fransico Meadow, PA-C    Family History Family History  Problem Relation Age of Onset  . Drug abuse Mother   . Heart disease Maternal Grandmother   . Heart disease Maternal Grandfather     Social History Social History  Substance Use Topics  . Smoking status: Current Some Day Smoker    Packs/day: 0.50    Types: Cigarettes    Start date: 05/01/2014  . Smokeless tobacco: Never Used  . Alcohol use No     Allergies   Bee venom; Penicillins; Mushroom extract complex; and Powder   Review of Systems Review of Systems    Respiratory: Positive for cough.   Cardiovascular: Negative for chest pain.  All other systems reviewed and are negative.    Physical Exam Updated Vital Signs BP (!) 153/88 (BP Location: Left Arm)   Pulse 68   Temp 98.4 F (36.9 C) (Oral)   Resp 17   Ht 5\' 6"  (1.676 m)   LMP 07/07/2017   SpO2 98%   Physical Exam  Constitutional: She is oriented to person, place, and time. She appears well-developed and well-nourished.  HENT:  Head: Normocephalic and atraumatic.  Right Ear: External ear normal.  Left Ear: External ear normal.  Nose: Nose normal.  Mouth/Throat: Oropharynx is clear and moist.  Tender maxillary sinuses,  Eyes: Pupils are equal, round, and reactive to light. Conjunctivae and EOM are normal.  Neck: Normal range of motion. Neck supple.  Cardiovascular: Normal rate.   Pulmonary/Chest: Effort normal.  Abdominal: Soft.  Musculoskeletal: Normal range of motion.  Neurological: She is alert and oriented to person, place, and time.  Skin: Skin is warm and dry.  Psychiatric: She has a normal mood and affect.  Nursing note and vitals reviewed.    ED Treatments / Results  Labs (all labs ordered are listed, but only abnormal results are displayed) Labs Reviewed - No data to display  EKG  EKG Interpretation None       Radiology No results found.  Procedures Procedures (including critical care time)  Medications Ordered in ED Medications - No data to display   Initial Impression / Assessment and Plan / ED Course  I have reviewed the triage vital signs and the nursing notes.  Pertinent labs & imaging results that were available during my care of the patient were reviewed by me and considered in my medical decision making (see chart for details).       Final Clinical Impressions(s) / ED Diagnoses   Final diagnoses:  Acute non-recurrent maxillary sinusitis    New Prescriptions Discharge Medication List as of 07/07/2017  9:27 AM    START taking  these medications   Details  sulfamethoxazole-trimethoprim (BACTRIM DS,SEPTRA DS) 800-160 MG tablet Take 1 tablet by mouth 2 (two) times daily., Starting Thu 07/07/2017, Until Thu 07/14/2017, Print      An After Visit Summary was printed and given to the patient.    Fransico Meadow, PA-C 07/07/17 1247    Fransico Meadow, PA-C 07/07/17 1253    Drenda Freeze, MD 07/07/17 [phone]

## 2017-07-07 NOTE — Discharge Instructions (Signed)
See your Physician for recheck if symptoms persist.  Try a decongestant like sudafed.  Afrin may help for the next 48 hours.  Stop after 48 hours.

## 2017-07-07 NOTE — ED Triage Notes (Signed)
Pt reports having nasal congestion, cough, headache since tuesday

## 2017-07-26 ENCOUNTER — Encounter: Payer: Self-pay | Admitting: Internal Medicine

## 2017-07-26 ENCOUNTER — Ambulatory Visit (INDEPENDENT_AMBULATORY_CARE_PROVIDER_SITE_OTHER): Payer: Medicaid Other | Admitting: Internal Medicine

## 2017-07-26 VITALS — BP 140/80 | HR 85 | Temp 98.9°F | Ht 66.0 in | Wt 191.6 lb

## 2017-07-26 DIAGNOSIS — E1165 Type 2 diabetes mellitus with hyperglycemia: Secondary | ICD-10-CM

## 2017-07-26 DIAGNOSIS — Z23 Encounter for immunization: Secondary | ICD-10-CM | POA: Diagnosis not present

## 2017-07-26 DIAGNOSIS — D649 Anemia, unspecified: Secondary | ICD-10-CM | POA: Diagnosis not present

## 2017-07-26 DIAGNOSIS — I10 Essential (primary) hypertension: Secondary | ICD-10-CM | POA: Diagnosis not present

## 2017-07-26 LAB — POCT GLYCOSYLATED HEMOGLOBIN (HGB A1C): Hemoglobin A1C: 6.1

## 2017-07-26 MED ORDER — LOSARTAN POTASSIUM 25 MG PO TABS: 25.0000 mg | ORAL_TABLET | Freq: Every day | ORAL | 0 refills | Status: DC

## 2017-07-26 NOTE — Patient Instructions (Addendum)
I want you to start taking losartan for your blood pressure. I want you to follow-up in 2 weeks for lab visit to get your kidney function checked. Please give neurology call and see about making appointment or sooner. I think otherwise continue with the iron as discussed and vitamin B12. Follow-up with me in 2-3 months

## 2017-07-26 NOTE — Progress Notes (Signed)
   [facility] Kerrin Mo, MD Phone: [phone]  Reason For Visit: Follow up T2DM, HTN, Anemia   # CHRONIC DM, Type 2: Meds: metformin 500 mg BID  Reports good compliance. Tolerating well w/o side-effects Starting  ARB  Lifestyle:Diet changes - portion control, cut down on the sweets  Denies polyuria, visual changes Still having the numbness and tingling.  # Anemia  - No dizzy, palpations, SOB - Has heavy periods, has two weeks of menastrual periods a month  - Has been taking iron, no issues with this     #HTN Reports previously was on blood pressure medication during pregnancy and then apparently blood pressure improved. Patient has not been on blood pressure medication for a while. Interested restarting given the patient has had elevated blood pressures recently Current Meds - Losartan  Reports good compliance, took meds today. Tolerating well, w/o complaints. Lifestyle -  As above  Denies CP, dyspnea, HA, edema, dizziness / lightheadedness  Past Medical History Reviewed problem list.  Medications- reviewed and updated No additions to family history Social history- patient is a non- smoker  Objective: BP 140/80 (BP Location: Left Arm, Patient Position: Sitting, Cuff Size: Normal)   Pulse 85   Temp 98.9 F (37.2 C) (Oral)   Ht 5\' 6"  (1.676 m)   Wt 191 lb 9.6 oz (86.9 kg)   LMP 07/07/2017   SpO2 99%   BMI 30.93 kg/m  Gen: NAD, alert, cooperative with exam Cardio: regular rate and rhythm, S1S2 heard, no murmurs appreciated Pulm: clear to auscultation bilaterally, no wheezes, rhonchi or rales Extremities: warm, well perfused, No edema,  MSK: Normal gait and station  Diabetic Foot Exam - Simple   Simple Foot Form Diabetic Foot exam was performed with the following findings:  Yes 07/26/2017 10:00 AM  Visual Inspection No deformities, no ulcerations, no other skin breakdown bilaterally:  Yes Sensation Testing Intact to touch and  monofilament testing bilaterally:  Yes Pulse Check Posterior Tibialis and Dorsalis pulse intact bilaterally:  Yes Comments     Assessment/Plan: See problem based a/p  Hypertension BP not well controlled  - Start Losartan  - Will recheck BMET in 2 weeks - patient would like to come in for lab visit due to financial issues.    Diabetes type 2, controlled ([place]) Continue metformin twice daily. Patient still having diabetic neuropathy, has been following with neurology regarding this issue Currently on 800 mg of gabapentin twice daily Patient to follow-up with neurologist later this month, therefore will hold off on making any medical changes Diabetic foot exam obtained   Anemia Discussed results of the patient Patient to continue iron supplementation Plans on having a hysterectomy due to concerns for polyps per her OB/GYN-likely the cause of patient's anemia

## 2017-07-27 NOTE — Assessment & Plan Note (Signed)
Discussed results of the patient Patient to continue iron supplementation Plans on having a hysterectomy due to concerns for polyps per her OB/GYN-likely the cause of patient's anemia

## 2017-07-27 NOTE — Assessment & Plan Note (Addendum)
Continue metformin twice daily. Patient still having diabetic neuropathy, has been following with neurology regarding this issue Currently on 800 mg of gabapentin twice daily Patient to follow-up with neurologist later this month, therefore will hold off on making any medical changes Diabetic foot exam obtained

## 2017-07-27 NOTE — Assessment & Plan Note (Addendum)
BP not well controlled  - Start Losartan  - Will recheck BMET in 2 weeks - patient would like to come in for lab visit due to financial issues.

## 2017-08-09 ENCOUNTER — Other Ambulatory Visit: Payer: Medicaid Other

## 2017-08-09 DIAGNOSIS — I10 Essential (primary) hypertension: Secondary | ICD-10-CM

## 2017-08-10 LAB — BASIC METABOLIC PANEL
BUN/Creatinine Ratio: 17 (ref 9–23)
BUN: 8 mg/dL (ref 6–20)
CO2: 19 mmol/L — ABNORMAL LOW (ref 20–29)
Calcium: 8.9 mg/dL (ref 8.7–10.2)
Chloride: 104 mmol/L (ref 96–106)
Creatinine, Ser: 0.47 mg/dL — ABNORMAL LOW (ref 0.57–1.00)
GFR calc Af Amer: 145 mL/min/{1.73_m2} (ref >59)
GFR calc non Af Amer: 126 mL/min/{1.73_m2} (ref >59)
Glucose: 214 mg/dL — ABNORMAL HIGH (ref 65–99)
Potassium: 3.7 mmol/L (ref 3.5–5.2)
Sodium: 138 mmol/L (ref 134–144)

## 2017-08-14 ENCOUNTER — Encounter: Payer: Self-pay | Admitting: Internal Medicine

## 2017-09-06 ENCOUNTER — Other Ambulatory Visit: Payer: Self-pay | Admitting: Internal Medicine

## 2017-09-06 DIAGNOSIS — I10 Essential (primary) hypertension: Secondary | ICD-10-CM

## 2017-09-06 MED ORDER — LOSARTAN POTASSIUM 25 MG PO TABS: 25.0000 mg | ORAL_TABLET | Freq: Every day | ORAL | 2 refills | Status: DC

## 2017-09-07 ENCOUNTER — Encounter: Payer: Self-pay | Admitting: Diagnostic Neuroimaging

## 2017-09-07 ENCOUNTER — Encounter (INDEPENDENT_AMBULATORY_CARE_PROVIDER_SITE_OTHER): Payer: Self-pay

## 2017-09-07 ENCOUNTER — Ambulatory Visit: Payer: Medicaid Other | Admitting: Diagnostic Neuroimaging

## 2017-09-07 VITALS — BP 148/93 | HR 73 | Ht 66.0 in | Wt 194.2 lb

## 2017-09-07 DIAGNOSIS — E538 Deficiency of other specified B group vitamins: Secondary | ICD-10-CM | POA: Diagnosis not present

## 2017-09-07 DIAGNOSIS — E1142 Type 2 diabetes mellitus with diabetic polyneuropathy: Secondary | ICD-10-CM | POA: Diagnosis not present

## 2017-09-07 DIAGNOSIS — G43109 Migraine with aura, not intractable, without status migrainosus: Secondary | ICD-10-CM | POA: Diagnosis not present

## 2017-09-07 DIAGNOSIS — H538 Other visual disturbances: Secondary | ICD-10-CM

## 2017-09-07 DIAGNOSIS — G63 Polyneuropathy in diseases classified elsewhere: Secondary | ICD-10-CM | POA: Diagnosis not present

## 2017-09-07 DIAGNOSIS — H4710 Unspecified papilledema: Secondary | ICD-10-CM | POA: Diagnosis not present

## 2017-09-07 NOTE — Patient Instructions (Signed)
DIABETIC NEUROPATHY + B12 DEFICIENCY NEUROPATHY - continue gabapentin 800mg  three times per day; may try tapering off to see if it is effective or not (reduce by 1 tab per day, every week) - continue B12 replacement - start alpha-lipoic acid 600mg  daily - try compounded neuropathy cream (mail order) - in future, could change zoloft (rx'd for mood swings) to cymbalta or amitriptyline (which can help with neuropathy pain)  HEADACHES / BLURRED VISION - I will request eye doctor records and setup lumbar puncture

## 2017-09-07 NOTE — Progress Notes (Signed)
[facility]  PATIENT: [name] DOB: [birth year]  REFERRING CLINICIAN: Synetta Shadow, MD HISTORY FROM: patient  REASON FOR VISIT: follow up    HISTORICAL  CHIEF COMPLAINT:  Chief Complaint  Patient presents with  . Follow-up  . Peripheral Neuropathy    Is about the same.  Taking gabapetin 300mg  po TID    HISTORY OF PRESENT ILLNESS:   UPDATE (09/07/17, VRP): Since last visit, doing about the same. Pain in feet / legs is stable (pins and needles, burning). Tolerating gabapentin 800mg  three times a day. No alleviating or aggravating factors.   Also reports 3 years of headaches, blurred vision, ringing in ears. Starting in ~2015, patient has had headaches (frontal, pressure, nausea, photo/phonophobia; prodromal "black dots" in vision; 3-4 HA per day, every 3 days, lasting hours at a time). Went to [place] for diabetic eye exam in Sept 2018  --> dx'd with "swelling in optic nerves" and "decreased peripheral vision". Was recommended to come here for LP.   Continues on B12 replacement and iron replacement.   Planning to have hysterectomy in Sep 28, 2017.   Now on zoloft 100mg  daily for mood swings.    PRIOR HPI (05/03/17): 36 year old female here for evaluation of pain, burning sensation, pins and needles sensation in her thighs and feet since March 2018. Just prior to onset of symptoms patient had been on injectable medication for psoriasis, but stopped this due to insurance reasons. Soon after patient started to have pain in legs and feet as above. Symptoms seem to spare her anterior tibial region. Patient also had some numbness and tingling in the toes. Patient does report some low back pain, hip pain as well. Some symptoms radiate from her low back to her legs. She has some mild intermittent numbness in her arms that is migratory. No specific prodromal or triggering factors other than medication change as above. Patient has tried gabapentin 300-600 mg 3 times per  day without benefit.   REVIEW OF SYSTEMS: Full 14 system review of systems performed and negative with exception of: ringing in ears double vision blurred vision headache numbness insomnia back pain.   ALLERGIES: Allergies  Allergen Reactions  . Bee Venom Anaphylaxis  . Penicillins Anaphylaxis  . Mushroom Extract Complex Nausea And Vomiting  . Powder Itching and Rash    The powder from latex gloves    HOME MEDICATIONS: Outpatient Medications Prior to Visit  Medication Sig Dispense Refill  . ferrous sulfate 325 (65 FE) MG tablet Take 1 tablet (325 mg total) by mouth daily. 30 tablet 2  . gabapentin (NEURONTIN) 800 MG tablet Take 1 tablet (800 mg total) by mouth 3 (three) times daily. 90 tablet 6  . losartan (COZAAR) 25 MG tablet Take 1 tablet (25 mg total) daily by mouth. 30 tablet 2  . metFORMIN (GLUCOPHAGE) 500 MG tablet Take 1 tablet (500 mg total) by mouth 2 (two) times daily with a meal. 180 tablet 3  . rosuvastatin (CRESTOR) 20 MG tablet Take 1 tablet (20 mg total) by mouth daily. 90 tablet 2  . sertraline (ZOLOFT) 100 MG tablet Take 100 mg daily by mouth.    . meclizine (ANTIVERT) 25 MG tablet Take 1 tablet (25 mg total) by mouth 3 (three) times daily as needed for dizziness. (Patient not taking: Reported on 09/07/2017) 30 tablet 0  . ondansetron (ZOFRAN ODT) 4 MG disintegrating tablet 4mg  ODT q4 hours prn nausea/vomit (Patient not taking: Reported on 09/07/2017) 20 tablet 0   No  facility-administered medications prior to visit.     PAST MEDICAL HISTORY: Past Medical History:  Diagnosis Date  . Abdominal lipoma 05/2015  . GERD (gastroesophageal reflux disease)   . History of MRSA infection ~ 2006   leg  . Hypertension    under control with med., has been on med. x 6 mos.  . Psoriasis     PAST SURGICAL HISTORY: Past Surgical History:  Procedure Laterality Date  . CESAREAN SECTION  06/21/2008; 08/18/2010  . CHOLECYSTECTOMY  07/10/2008  . LIPOMA EXCISION  2014   back     FAMILY HISTORY: Family History  Problem Relation Age of Onset  . Drug abuse Mother   . Heart disease Maternal Grandmother   . Heart disease Maternal Grandfather     SOCIAL HISTORY:  Social History   Socioeconomic History  . Marital status: Married    Spouse name: Not on file  . Number of children: Not on file  . Years of education: Not on file  . Highest education level: Not on file  Social Needs  . Financial resource strain: Not on file  . Food insecurity - worry: Not on file  . Food insecurity - inability: Not on file  . Transportation needs - medical: Not on file  . Transportation needs - non-medical: Not on file  Occupational History  . Not on file  Tobacco Use  . Smoking status: Current Some Day Smoker    Packs/day: 0.50    Types: Cigarettes    Start date: 05/01/2014  . Smokeless tobacco: Never Used  Substance and Sexual Activity  . Alcohol use: No    Alcohol/week: 0.0 oz  . Drug use: No  . Sexual activity: Yes    Birth control/protection: None  Other Topics Concern  . Not on file  Social History Narrative   Live home with husband and 3 kids.   Education HS grad.  Cul[employer] grad.  Caffeine  8 glasses (sweet tea), some soda's occ.  Working: [employer].       PHYSICAL EXAM  GENERAL EXAM/CONSTITUTIONAL: Vitals:  Vitals:   09/07/17 0909  BP: (!) 148/93  Pulse: 73  Weight: 194 lb 3.2 oz (88.1 kg)  Height: 5\' 6"  (1.676 m)   Body mass index is 31.34 kg/m. No exam data present  Patient is in no distress; well developed, nourished and groomed; neck is supple  CARDIOVASCULAR:  Examination of carotid arteries is normal; no carotid bruits  Regular rate and rhythm, no murmurs  Examination of peripheral vascular system by observation and palpation is normal  EYES:  Ophthalmoscopic exam of optic discs and posterior segments is NOTABLE FOR MILD BLURRED DISC MARGINS  MUSCULOSKELETAL:  Gait, strength, tone, movements noted in Neurologic exam  below  NEUROLOGIC: MENTAL STATUS:  No flowsheet data found.  awake, alert, oriented to person, place and time  recent and remote memory intact  normal attention and concentration  language fluent, comprehension intact, naming intact,   fund of knowledge appropriate  CRANIAL NERVE:   2nd - MILD BLURRED DISC MARGINS  2nd, 3rd, 4th, 6th - pupils equal and reactive to light, visual fields full to confrontation, extraocular muscles intact, no nystagmus  5th - facial sensation symmetric  7th - facial strength symmetric  8th - hearing intact  9th - palate elevates symmetrically, uvula midline  11th - shoulder shrug symmetric  12th - tongue protrusion midline  MOTOR:   normal bulk and tone; 5/5 IN BUE AND BLE  SENSORY:   normal and  symmetric to light touch  DECR TO TEMP AND VIB IN FEET/ANKLE  COORDINATION:   finger-nose-finger, fine finger movements normal  REFLEXES:   deep tendon reflexes --> BUE 2, KNEES 1, ANKLES TRACE  GAIT/STATION:   MILD ANTALGIC GAIT; romberg is negative    DIAGNOSTIC DATA (LABS, IMAGING, TESTING) - I reviewed patient records, labs, notes, testing and imaging myself where available.  Lab Results  Component Value Date   WBC 9.8 06/17/2017   HGB 11.7 (L) 06/17/2017   HCT 36.4 06/17/2017   MCV 73.2 (L) 06/17/2017   PLT 289 06/17/2017      Component Value Date/Time   NA 138 08/09/2017 0955   K 3.7 08/09/2017 0955   CL 104 08/09/2017 0955   CO2 19 (L) 08/09/2017 0955   GLUCOSE 214 (H) 08/09/2017 0955   GLUCOSE 121 (H) 06/17/2017 0005   BUN 8 08/09/2017 0955   CREATININE 0.47 (L) 08/09/2017 0955   CREATININE 0.60 10/18/2016 1123   CALCIUM 8.9 08/09/2017 0955   PROT 7.4 06/17/2017 0005   PROT 7.2 05/03/2017 0947   ALBUMIN 4.2 06/17/2017 0005   AST 24 06/17/2017 0005   ALT 22 06/17/2017 0005   ALKPHOS 55 06/17/2017 0005   BILITOT 0.4 06/17/2017 0005   GFRNONAA 126 08/09/2017 0955   GFRNONAA >89 10/18/2016 1123   GFRAA  145 08/09/2017 0955   GFRAA >89 10/18/2016 1123   Lab Results  Component Value Date   CHOL 168 10/18/2016   HDL 26 (L) 10/18/2016   LDLCALC 114 (H) 10/18/2016   TRIG 142 10/18/2016   CHOLHDL 6.5 (H) 10/18/2016   Lab Results  Component Value Date   HGBA1C 6.1 07/26/2017   Lab Results  Component Value Date   VITAMINB12 <150 (L) 05/03/2017   Lab Results  Component Value Date   TSH 1.710 05/03/2017     07/08/15 xray lumbar  - Negative lumbar spine.  05/31/17 Unremarkable MRI scan of the brain without contrast. No significant change compared with MRI 10/08/13  05/31/17 Abnormal MRI scan lumbar spine showing mild spondylitic changes mostly at L4-5 and L5-S1 with prominent facet hypertrophy and bilateral mild foraminal narrowing without definite encroachment on the nerve roots.     ASSESSMENT AND PLAN  36 y.o. year old female here with numbness, tingling, pain sensation in lower extremities, likely related to diabetic neuropathy and b12 deficiency neuropathy. Also, psoriasis can been associated with peripheral neuropathy.  Also with headaches, dizziness, tinnitus, blurred vision / papilledema. May be due to [place] / pseudotumor cerebri.   Dx:   1. Diabetic polyneuropathy associated with type 2 diabetes mellitus ([place])   2. Vitamin B12 deficiency neuropathy ([place])   3. Blurred vision   4. Papilledema   5. Migraine with aura and without status migrainosus, not intractable      PLAN:  DIABETIC NEUROPATHY + B12 DEFICIENCY NEUROPATHY - continue gabapentin 800mg  three times per day; may try tapering off because patient not sure if it is effective or not - continue B12 replacement - start alpha-lipoic acid 600mg  daily - trial comp - in future, could change zoloft (rx'd for mood swings) to cymbalta or amitriptyline (which can help with neuropathy pain)  HEADACHES / BLURRED VISION (IIH / pseudotumor cerebri +/- migraine with aura) - apparently, patient went to ophthalomology, who  found papilledema and referred patient here for lumbar puncture; we will request records and check LP to measure opening pressure) - may consider acetazolamide vs topiramate  Orders Placed This Encounter  Procedures  .  DG FLUORO GUIDED LOC OF NEEDLE/CATH TIP FOR SPINAL INJECT LT   Return in about 3 months (around 12/08/2017).  I reviewed images, labs, notes, records myself. I summarized findings and reviewed with patient, for this high risk condition (PAPILLEDEMA, HEADACHES, VISION CHANGES) requiring high complexity decision making.     Penni Bombard, MD 37/0/4888, 9:16 AM Certified in Neurology, Neurophysiology and Neuroimaging  [facility] [address] [place], [place] [address] [phone]

## 2017-09-09 NOTE — Patient Instructions (Addendum)
Your procedure is scheduled on:  Wednesday, Nov. 28, 2018  Enter through the [employer] [facility] at:  6:00 AM  Pick up the phone at the desk and dial [phone].  Call this number if you have problems the morning of surgery: [phone].  Remember:  Do NOT eat food or drink after:  Midnight Tuesday  Take these medicines the morning of surgery with a SIP OF WATER:  None  Do NOT take Metformin the night before surgery  Stop ALL herbal medications at this time  Do NOT smoke the day of surgery.  Do NOT wear jewelry (body piercing), metal hair clips/bobby pins, make-up, artifical eyelashes or nail polish. Do NOT wear lotions, powders, or perfumes.  You may wear deodorant. Do NOT shave for 48 hours prior to surgery. Do NOT bring valuables to the hospital. Contacts, dentures, or bridgework may not be worn into surgery.  Leave suitcase in car.  After surgery it may be brought to your room.  For patients admitted to the hospital, checkout time is 11:00 AM the day of discharge.  Bring a copy of your healthcare power of attorney and living will documents.       [place] - Preparing for Surgery Before surgery, you can play an important role.  Because skin is not sterile, your skin needs to be as free of germs as possible.  You can reduce the number of germs on your skin by washing with CHG (chlorahexidine gluconate) soap before surgery.  CHG is an antiseptic cleaner which kills germs and bonds with the skin to continue killing germs even after washing. Please DO NOT use if you have an allergy to CHG or antibacterial soaps.  If your skin becomes reddened/irritated stop using the CHG and inform your nurse when you arrive at Short Stay. Do not shave (including legs and underarms) for at least 48 hours prior to the first CHG shower.  You may shave your face/neck.  Please follow these instructions carefully:  1.  Shower with CHG Soap the night before surgery and the  morning of  surgery.  2.  If you choose to wash your hair, wash your hair first as usual with your normal  shampoo.  3.  After you shampoo, rinse your hair and body thoroughly to remove the shampoo.                             4.  Use CHG as you would any other liquid soap.  You can apply chg directly to the skin and wash.  Gently with a scrungie or clean washcloth.  5.  Apply the CHG Soap to your body ONLY FROM THE NECK DOWN.   Do   not use on face/ open                           Wound or open sores. Avoid contact with eyes, ears mouth and   genitals (private parts).                       Wash face,  Genitals (private parts) with your normal soap.             6.  Wash thoroughly, paying special attention to the area where your    surgery  will be performed.  7.  Thoroughly rinse your body with warm water from the neck down.  8.  DO NOT shower/wash with your normal soap after using and rinsing off the CHG Soap.                9.  Pat yourself dry with a clean towel.            10.  Wear clean pajamas.            11.  Place clean sheets on your bed the night of your first shower and do not  sleep with pets. Day of Surgery : Do not apply any lotions/deodorants the morning of surgery.  Please wear clean clothes to the hospital/surgery center.  FAILURE TO FOLLOW THESE INSTRUCTIONS MAY RESULT IN THE CANCELLATION OF YOUR SURGERY  PATIENT SIGNATURE_________________________________  NURSE SIGNATURE__________________________________  ________________________________________________________________________    [name]  An incentive spirometer is a tool that can help keep your lungs clear and active. This tool measures how well you are filling your lungs with each breath. Taking long deep breaths may help reverse or decrease the chance of developing breathing (pulmonary) problems (especially infection) following:  A long period of time when you are unable to move or be active. BEFORE THE  PROCEDURE   If the spirometer includes an indicator to show your best effort, your nurse or respiratory therapist will set it to a desired goal.  If possible, sit up straight or lean slightly forward. Try not to slouch.  Hold the incentive spirometer in an upright position. INSTRUCTIONS FOR USE  1. Sit on the edge of your bed if possible, or sit up as far as you can in bed or on a chair. 2. Hold the incentive spirometer in an upright position. 3. Breathe out normally. 4. Place the mouthpiece in your mouth and seal your lips tightly around it. 5. Breathe in slowly and as deeply as possible, raising the piston or the ball toward the top of the column. 6. Hold your breath for 3-5 seconds or for as long as possible. Allow the piston or ball to fall to the bottom of the column. 7. Remove the mouthpiece from your mouth and breathe out normally. 8. Rest for a few seconds and repeat Steps 1 through 7 at least 10 times every 1-2 hours when you are awake. Take your time and take a few normal breaths between deep breaths. 9. The spirometer may include an indicator to show your best effort. Use the indicator as a goal to work toward during each repetition. 10. After each set of 10 deep breaths, practice coughing to be sure your lungs are clear. If you have an incision (the cut made at the time of surgery), support your incision when coughing by placing a pillow or rolled up towels firmly against it. Once you are able to get out of bed, walk around indoors and cough well. You may stop using the incentive spirometer when instructed by your caregiver.  RISKS AND COMPLICATIONS  Take your time so you do not get dizzy or light-headed.  If you are in pain, you may need to take or ask for pain medication before doing incentive spirometry. It is harder to take a deep breath if you are having pain. AFTER USE  Rest and breathe slowly and easily.  It can be helpful to keep track of a log of your progress. Your  caregiver can provide you with a simple table to help with this. If you are using the spirometer at home, follow these instructions: [place] IF:  You are having difficultly using the spirometer.  You have trouble using the spirometer as often as instructed.  Your pain medication is not giving enough relief while using the spirometer.  You develop fever of 100.5 F (38.1 C) or higher. SEEK IMMEDIATE MEDICAL CARE IF:   You cough up bloody sputum that had not been present before.  You develop fever of 102 F (38.9 C) or greater.  You develop worsening pain at or near the incision site. MAKE SURE YOU:   Understand these instructions.  Will watch your condition.  Will get help right away if you are not doing well or get worse. Document Released: 02/28/2007 Document Revised: 01/10/2012 Document Reviewed: 05/01/2007 [facility] Patient Information 2014 [place], [place].   ________________________________________________________________________

## 2017-09-13 ENCOUNTER — Other Ambulatory Visit: Payer: Self-pay | Admitting: Obstetrics and Gynecology

## 2017-09-15 ENCOUNTER — Encounter (HOSPITAL_COMMUNITY)
Admission: RE | Admit: 2017-09-15 | Discharge: 2017-09-15 | Disposition: A | Discharge status: Home / Self Care | Payer: Medicaid Other | Source: Ambulatory Visit | Attending: Obstetrics and Gynecology | Admitting: Obstetrics and Gynecology

## 2017-09-15 ENCOUNTER — Other Ambulatory Visit: Payer: Self-pay

## 2017-09-15 ENCOUNTER — Encounter (HOSPITAL_COMMUNITY): Payer: Self-pay

## 2017-09-15 DIAGNOSIS — N920 Excessive and frequent menstruation with regular cycle: Secondary | ICD-10-CM | POA: Diagnosis not present

## 2017-09-15 DIAGNOSIS — N83292 Other ovarian cyst, left side: Secondary | ICD-10-CM | POA: Diagnosis not present

## 2017-09-15 DIAGNOSIS — R102 Pelvic and perineal pain: Secondary | ICD-10-CM | POA: Insufficient documentation

## 2017-09-15 DIAGNOSIS — Z01812 Encounter for preprocedural laboratory examination: Secondary | ICD-10-CM | POA: Diagnosis present

## 2017-09-15 HISTORY — DX: Headache: R51

## 2017-09-15 HISTORY — DX: Emotional lability: R45.86

## 2017-09-15 HISTORY — DX: Unspecified papilledema: H47.10

## 2017-09-15 HISTORY — DX: Headache, unspecified: R51.9

## 2017-09-15 HISTORY — DX: Type 2 diabetes mellitus without complications: E11.9

## 2017-09-15 HISTORY — DX: Unspecified osteoarthritis, unspecified site: M19.90

## 2017-09-15 LAB — CBC
HCT: 39.5 % (ref 36.0–46.0)
Hemoglobin: 12.6 g/dL (ref 12.0–15.0)
MCH: 25 pg — ABNORMAL LOW (ref 26.0–34.0)
MCHC: 31.9 g/dL (ref 30.0–36.0)
MCV: 78.2 fL (ref 78.0–100.0)
Platelets: 276 10*3/uL (ref 150–400)
RBC: 5.05 MIL/uL (ref 3.87–5.11)
RDW: 18.7 % — ABNORMAL HIGH (ref 11.5–15.5)
WBC: 8.9 10*3/uL (ref 4.0–10.5)

## 2017-09-15 LAB — BASIC METABOLIC PANEL
Anion gap: 8 (ref 5–15)
BUN: 12 mg/dL (ref 6–20)
CO2: 24 mmol/L (ref 22–32)
Calcium: 9.2 mg/dL (ref 8.9–10.3)
Chloride: 107 mmol/L (ref 101–111)
Creatinine, Ser: 0.54 mg/dL (ref 0.44–1.00)
GFR calc Af Amer: >60 mL/min (ref >60)
GFR calc non Af Amer: >60 mL/min (ref >60)
Glucose, Bld: 147 mg/dL — ABNORMAL HIGH (ref 65–99)
Potassium: 4 mmol/L (ref 3.5–5.1)
Sodium: 139 mmol/L (ref 135–145)

## 2017-09-15 LAB — SURGICAL PCR SCREEN
MRSA, PCR: NEGATIVE
Staphylococcus aureus: NEGATIVE

## 2017-09-15 NOTE — Pre-Procedure Instructions (Signed)
Reviewed patient history with Dr. Donald Prose no EKG need for pre op at this time.

## 2017-09-16 NOTE — H&P (Signed)
36 y.o. yo complains of pain with LO complex cyst and menorrhagia.  Previously:"Pt tired of menorrhagia and pain. Cyst is bigger - now 3.8 cm and looks like hemorragic. Pt would like ovary out. Pt tired of pain. Will schedule RTLH/LSO/salpingectomy. Bladder diary with and without Azo Bladder control. Stop iron for just one week to see if stool is better. "  Past Medical History:  Diagnosis Date  . Abdominal lipoma 05/2015  . Anemia   . Arthritis    lower back  . Diabetes mellitus without complication ([place])   . GERD (gastroesophageal reflux disease)   . Headache    Migraines  . History of MRSA infection ~ 2006   leg  . Hypertension    under control with med., has been on med. x 6 mos.  . Mood swing   . Nerve pain    leg and feet bilateral  . Optic nerve swelling   . Psoriasis    Past Surgical History:  Procedure Laterality Date  . CESAREAN SECTION  06/21/2008; 08/18/2010  . CESAREAN SECTION N/A 07/10/2014   Procedure: REPEAT CESAREAN SECTION;  Surgeon: Daria Pastures, MD;  Location: [facility];  Service: Obstetrics;  Laterality: N/A;  . CHOLECYSTECTOMY  07/10/2008  . COLONOSCOPY    . LIPOMA EXCISION  2014   back  . LIPOMA EXCISION N/A 05/21/2015   Procedure: EXCISION LIPOMAS ON ABDOMEN;  Surgeon: Erroll Luna, MD;  Location: [place];  Service: General;  Laterality: N/A;  . LUMBAR PUNCTURE  09/19/17   [facility], Dr. Curt Bears  . TUBAL LIGATION    . WISDOM TOOTH EXTRACTION      Social History   Socioeconomic History  . Marital status: Married    Spouse name: Not on file  . Number of children: Not on file  . Years of education: Not on file  . Highest education level: Not on file  Social Needs  . Financial resource strain: Not on file  . Food insecurity - worry: Not on file  . Food insecurity - inability: Not on file  . Transportation needs - medical: Not on file  . Transportation needs - non-medical: Not on file  Occupational History  .  Not on file  Tobacco Use  . Smoking status: Current Some Day Smoker    Packs/day: 0.50    Years: 26.00    Pack years: 13.00    Types: Cigarettes    Start date: 05/01/2014  . Smokeless tobacco: Never Used  Substance and Sexual Activity  . Alcohol use: No    Alcohol/week: 0.0 oz  . Drug use: No  . Sexual activity: Yes    Birth control/protection: None  Other Topics Concern  . Not on file  Social History Narrative   Live home with husband and 3 kids.   Education HS grad.  Culinary grad.  Caffeine  8 glasses (sweet tea), some soda's occ.  Working: [employer].      No current facility-administered medications on file prior to encounter.    Current Outpatient Medications on File Prior to Encounter  Medication Sig Dispense Refill  . Coal Tar Extract [phone] PSORIASIS MULTI-SYMPTOM EX) Apply 1 application as needed topically (for psoriasis).    . Diclofenac Sodium 3 % GEL Apply 2-3 g 4 (four) times daily topically.  0  . Doxepin HCl 5 % CREA Apply 2 g 3 (three) times daily topically.  0  . ferrous sulfate 325 (65 FE) MG tablet Take 1 tablet (325 mg  total) by mouth daily. 30 tablet 2  . gabapentin (NEURONTIN) 800 MG tablet Take 1 tablet (800 mg total) by mouth 3 (three) times daily. 90 tablet 6  . lidocaine (XYLOCAINE) 5 % ointment Apply 2-3 g 4 (four) times daily topically.  0  . metFORMIN (GLUCOPHAGE) 500 MG tablet Take 1 tablet (500 mg total) by mouth 2 (two) times daily with a meal. 180 tablet 3  . rosuvastatin (CRESTOR) 20 MG tablet Take 1 tablet (20 mg total) by mouth daily. 90 tablet 2  . vitamin B-12 (CYANOCOBALAMIN) 1000 MCG tablet Take 1,000 mcg daily by mouth.      Allergies  Allergen Reactions  . Bee Venom Anaphylaxis  . Penicillins Anaphylaxis and Other (See Comments)    Has patient had a PCN reaction causing immediate rash, facial/tongue/throat swelling, SOB or lightheadedness with hypotension: Yes Has patient had a PCN reaction causing severe rash involving mucus membranes or  skin necrosis: No Has patient had a PCN reaction that required hospitalization: Yes Has patient had a PCN reaction occurring within the last 10 years: No If all of the above answers are "NO", then may proceed with Cephalosporin use.   . Mushroom Extract Complex Nausea And Vomiting  . Powder Itching, Rash and Other (See Comments)    The powder from latex gloves    @VITALS2 @  Lungs: clear to ascultation Cor:  RRR Abdomen:  soft, nontender, nondistended. Ex:  no cords, erythema Pelvic:   Vulva: no masses, no atrophy, no lesions Vagina: no tenderness, no erythema, no abnormal vaginal discharge, no vesicle(s) or ulcers, no cystocele, no rectocele Cervix: grossly normal, no discharge, no cervical motion tenderness Uterus: normal size (7), normal shape, midline, no uterine prolapse, non-tender Bladder/Urethra: normal meatus, no urethral discharge, no urethral mass, bladder non distended, Urethra well supported Adnexa/Parametria: no parametrial tenderness, no parametrial mass, no adnexal tenderness, no ovarian mass  A:  Pt with persistent pain from complex cyst, menorrhagia-for Robo TLH/LSO/cysto/R salpingectomy, possible BSO.  Pt has very mild to rare SUI- no TVT.   P: All risks, benefits and alternatives d/w patient and she desires to proceed.  Patient has undergone a modified bowel prep and will receive preop antibiotics and SCDs during the operation.     Sayana Salley A

## 2017-09-19 ENCOUNTER — Ambulatory Visit
Admission: RE | Admit: 2017-09-19 | Discharge: 2017-09-19 | Disposition: A | Discharge status: Home / Self Care | Payer: Medicaid Other | Source: Ambulatory Visit | Attending: Diagnostic Neuroimaging | Admitting: Diagnostic Neuroimaging

## 2017-09-19 ENCOUNTER — Telehealth: Payer: Self-pay | Admitting: *Deleted

## 2017-09-19 DIAGNOSIS — H4710 Unspecified papilledema: Secondary | ICD-10-CM

## 2017-09-19 DIAGNOSIS — H538 Other visual disturbances: Secondary | ICD-10-CM

## 2017-09-19 HISTORY — PX: LUMBAR PUNCTURE: SHX1985

## 2017-09-19 LAB — GLUCOSE, CSF: Glucose, CSF: 74 mg/dL (ref 40–80)

## 2017-09-19 LAB — PROTEIN, CSF: Total Protein, CSF: 53 mg/dL — ABNORMAL HIGH (ref 15–45)

## 2017-09-19 LAB — CSF CELL COUNT WITH DIFFERENTIAL
RBC Count, CSF: 0 cells/uL (ref 0–10)
WBC, CSF: 2 cells/uL (ref 0–5)

## 2017-09-19 NOTE — Discharge Instructions (Signed)

## 2017-09-19 NOTE — Telephone Encounter (Signed)
Called to relay that got some stat labs back on this pt.  CSF.GLUCOSE 74, and CSF PROTEIN  53H, other pending at this time.  Dr. Leta Baptist out of office this week.

## 2017-09-20 ENCOUNTER — Telehealth: Payer: Self-pay | Admitting: *Deleted

## 2017-09-20 NOTE — Telephone Encounter (Signed)
Attempted twice to pick up call from [employer] through phone room. No one answered.  Advised phone staff to [employer], RN if call comes in again. [name] is aware.

## 2017-09-20 NOTE — Telephone Encounter (Signed)
Routed results to Dr. Duncan Dull (yesterday).  Copies placed on Dr. Gladstone Lighter desk.

## 2017-09-21 LAB — GRAM STAIN

## 2017-09-23 LAB — CSF CULTURE W GRAM STAIN: MICRO NUMBER:: 81302109

## 2017-09-23 LAB — CSF CULTURE
Result:: NO GROWTH
SPECIMEN QUALITY:: ADEQUATE

## 2017-09-28 ENCOUNTER — Encounter (HOSPITAL_COMMUNITY): Payer: Self-pay

## 2017-09-28 ENCOUNTER — Ambulatory Visit (HOSPITAL_COMMUNITY)
Admission: RE | Admit: 2017-09-28 | Discharge: 2017-09-29 | Disposition: A | Discharge status: Home / Self Care | Payer: Medicaid Other | Source: Ambulatory Visit | Attending: Obstetrics and Gynecology | Admitting: Obstetrics and Gynecology

## 2017-09-28 ENCOUNTER — Encounter (HOSPITAL_COMMUNITY)
Admission: RE | Disposition: A | Discharge status: Home / Self Care | Payer: Self-pay | Source: Ambulatory Visit | Attending: Obstetrics and Gynecology

## 2017-09-28 ENCOUNTER — Ambulatory Visit (HOSPITAL_COMMUNITY): Payer: Medicaid Other | Admitting: Anesthesiology

## 2017-09-28 ENCOUNTER — Other Ambulatory Visit: Payer: Self-pay

## 2017-09-28 DIAGNOSIS — N920 Excessive and frequent menstruation with regular cycle: Secondary | ICD-10-CM | POA: Diagnosis present

## 2017-09-28 DIAGNOSIS — N83202 Unspecified ovarian cyst, left side: Secondary | ICD-10-CM | POA: Insufficient documentation

## 2017-09-28 DIAGNOSIS — Z8614 Personal history of Methicillin resistant Staphylococcus aureus infection: Secondary | ICD-10-CM | POA: Insufficient documentation

## 2017-09-28 DIAGNOSIS — Z7984 Long term (current) use of oral hypoglycemic drugs: Secondary | ICD-10-CM | POA: Insufficient documentation

## 2017-09-28 DIAGNOSIS — Z880 Allergy status to penicillin: Secondary | ICD-10-CM | POA: Diagnosis not present

## 2017-09-28 DIAGNOSIS — K219 Gastro-esophageal reflux disease without esophagitis: Secondary | ICD-10-CM | POA: Diagnosis not present

## 2017-09-28 DIAGNOSIS — E119 Type 2 diabetes mellitus without complications: Secondary | ICD-10-CM | POA: Diagnosis not present

## 2017-09-28 DIAGNOSIS — Z79899 Other long term (current) drug therapy: Secondary | ICD-10-CM | POA: Diagnosis not present

## 2017-09-28 DIAGNOSIS — N838 Other noninflammatory disorders of ovary, fallopian tube and broad ligament: Secondary | ICD-10-CM | POA: Diagnosis not present

## 2017-09-28 DIAGNOSIS — L409 Psoriasis, unspecified: Secondary | ICD-10-CM | POA: Insufficient documentation

## 2017-09-28 DIAGNOSIS — I10 Essential (primary) hypertension: Secondary | ICD-10-CM | POA: Insufficient documentation

## 2017-09-28 DIAGNOSIS — F17210 Nicotine dependence, cigarettes, uncomplicated: Secondary | ICD-10-CM | POA: Insufficient documentation

## 2017-09-28 DIAGNOSIS — Z9889 Other specified postprocedural states: Secondary | ICD-10-CM

## 2017-09-28 HISTORY — PX: ROBOTIC ASSISTED TOTAL HYSTERECTOMY WITH BILATERAL SALPINGO OOPHERECTOMY: SHX6086

## 2017-09-28 HISTORY — PX: CYSTOSCOPY: SHX5120

## 2017-09-28 HISTORY — PX: OOPHORECTOMY: SHX6387

## 2017-09-28 HISTORY — PX: ROBOTIC ASSISTED LAPAROSCOPIC LYSIS OF ADHESION: SHX6080

## 2017-09-28 LAB — GLUCOSE, CAPILLARY
Glucose-Capillary: 109 mg/dL — ABNORMAL HIGH (ref 65–99)
Glucose-Capillary: 178 mg/dL — ABNORMAL HIGH (ref 65–99)

## 2017-09-28 LAB — PREGNANCY, URINE: Preg Test, Ur: NEGATIVE

## 2017-09-28 SURGERY — CYSTOSCOPY
Anesthesia: General | Site: Bladder

## 2017-09-28 MED ORDER — MIDAZOLAM HCL 2 MG/2ML IJ SOLN
INTRAMUSCULAR | Status: AC
  Filled 2017-09-28: qty 2

## 2017-09-28 MED ORDER — LIDOCAINE HCL (CARDIAC) 20 MG/ML IV SOLN
INTRAVENOUS | Status: AC
  Filled 2017-09-28: qty 5

## 2017-09-28 MED ORDER — HYDROMORPHONE HCL 1 MG/ML IJ SOLN
INTRAMUSCULAR | Status: DC | PRN
  Administered 2017-09-28: 1 mg via INTRAVENOUS

## 2017-09-28 MED ORDER — ONDANSETRON HCL 4 MG/2ML IJ SOLN
4.0000 mg | Freq: Four times a day (QID) | INTRAMUSCULAR | Status: DC | PRN
  Administered 2017-09-28: 4 mg via INTRAVENOUS
  Filled 2017-09-28: qty 2

## 2017-09-28 MED ORDER — FENTANYL CITRATE (PF) 250 MCG/5ML IJ SOLN
INTRAMUSCULAR | Status: AC
  Filled 2017-09-28: qty 5

## 2017-09-28 MED ORDER — LOSARTAN POTASSIUM 25 MG PO TABS
25.0000 mg | ORAL_TABLET | Freq: Every day | ORAL | Status: DC
  Filled 2017-09-28 (×2): qty 1

## 2017-09-28 MED ORDER — OXYCODONE-ACETAMINOPHEN 5-325 MG PO TABS
1.0000 | ORAL_TABLET | ORAL | Status: DC | PRN
  Administered 2017-09-28 (×2): 2 via ORAL
  Administered 2017-09-28: 1 via ORAL
  Administered 2017-09-29: 2 via ORAL
  Filled 2017-09-28: qty 2
  Filled 2017-09-28: qty 1
  Filled 2017-09-28 (×2): qty 2

## 2017-09-28 MED ORDER — PROMETHAZINE HCL 25 MG/ML IJ SOLN
6.2500 mg | INTRAMUSCULAR | Status: DC | PRN
  Administered 2017-09-28: 6.25 mg via INTRAVENOUS

## 2017-09-28 MED ORDER — HYDROMORPHONE HCL 1 MG/ML IJ SOLN
INTRAMUSCULAR | Status: AC
  Filled 2017-09-28: qty 1

## 2017-09-28 MED ORDER — HYDROMORPHONE HCL 1 MG/ML IJ SOLN
INTRAMUSCULAR | Status: AC
  Administered 2017-09-28: 0.5 mg via INTRAVENOUS
  Filled 2017-09-28: qty 1

## 2017-09-28 MED ORDER — IBUPROFEN 800 MG PO TABS: 800.0000 mg | ORAL_TABLET | Freq: Three times a day (TID) | ORAL | Status: DC | PRN

## 2017-09-28 MED ORDER — KETOROLAC TROMETHAMINE 30 MG/ML IJ SOLN: 30.0000 mg | Freq: Four times a day (QID) | INTRAMUSCULAR | Status: DC

## 2017-09-28 MED ORDER — LACTATED RINGERS IV SOLN
INTRAVENOUS | Status: DC
  Administered 2017-09-28 (×2): via INTRAVENOUS

## 2017-09-28 MED ORDER — SODIUM CHLORIDE 0.9 % IR SOLN
Status: DC | PRN
  Administered 2017-09-28: 3000 mL

## 2017-09-28 MED ORDER — FENTANYL CITRATE (PF) 100 MCG/2ML IJ SOLN
INTRAMUSCULAR | Status: DC | PRN
  Administered 2017-09-28 (×4): 100 ug via INTRAVENOUS
  Administered 2017-09-28 (×2): 50 ug via INTRAVENOUS

## 2017-09-28 MED ORDER — MIDAZOLAM HCL 2 MG/2ML IJ SOLN
INTRAMUSCULAR | Status: DC | PRN
  Administered 2017-09-28: 2 mg via INTRAVENOUS

## 2017-09-28 MED ORDER — FAMOTIDINE 20 MG PO TABS
ORAL_TABLET | ORAL | Status: AC
  Administered 2017-09-28: 20 mg via ORAL
  Filled 2017-09-28: qty 1

## 2017-09-28 MED ORDER — DOXEPIN HCL 5 % EX CREA: 2.0000 g | TOPICAL_CREAM | Freq: Three times a day (TID) | CUTANEOUS | Status: DC

## 2017-09-28 MED ORDER — STERILE WATER FOR IRRIGATION IR SOLN
Status: DC | PRN
  Administered 2017-09-28: 1000 mL via INTRAVESICAL

## 2017-09-28 MED ORDER — DICLOFENAC SODIUM 3 % TD GEL: 2.0000 g | Freq: Four times a day (QID) | TRANSDERMAL | Status: DC

## 2017-09-28 MED ORDER — ROPIVACAINE HCL 5 MG/ML IJ SOLN
INTRAMUSCULAR | Status: AC
  Filled 2017-09-28: qty 30

## 2017-09-28 MED ORDER — ONDANSETRON HCL 4 MG PO TABS: 4.0000 mg | ORAL_TABLET | Freq: Four times a day (QID) | ORAL | Status: DC | PRN

## 2017-09-28 MED ORDER — ROCURONIUM BROMIDE 100 MG/10ML IV SOLN
INTRAVENOUS | Status: AC
  Filled 2017-09-28: qty 1

## 2017-09-28 MED ORDER — DEXAMETHASONE SODIUM PHOSPHATE 10 MG/ML IJ SOLN
INTRAMUSCULAR | Status: DC | PRN
  Administered 2017-09-28: 4 mg via INTRAVENOUS

## 2017-09-28 MED ORDER — CELECOXIB 200 MG PO CAPS
ORAL_CAPSULE | ORAL | Status: AC
  Administered 2017-09-28: 200 mg via ORAL
  Filled 2017-09-28: qty 1

## 2017-09-28 MED ORDER — LIDOCAINE HCL (CARDIAC) 20 MG/ML IV SOLN
INTRAVENOUS | Status: DC | PRN
  Administered 2017-09-28: 100 mg via INTRAVENOUS

## 2017-09-28 MED ORDER — ACETAMINOPHEN 160 MG/5ML PO SOLN
ORAL | Status: AC
  Administered 2017-09-28: 975 mg via ORAL
  Filled 2017-09-28: qty 40.6

## 2017-09-28 MED ORDER — GABAPENTIN 800 MG PO TABS
800.0000 mg | ORAL_TABLET | Freq: Three times a day (TID) | ORAL | Status: DC
  Filled 2017-09-28 (×3): qty 1

## 2017-09-28 MED ORDER — SERTRALINE HCL 100 MG PO TABS
100.0000 mg | ORAL_TABLET | Freq: Every day | ORAL | Status: DC
  Filled 2017-09-28 (×2): qty 1

## 2017-09-28 MED ORDER — OXYCODONE HCL 5 MG PO TABS: 5.0000 mg | ORAL_TABLET | Freq: Once | ORAL | Status: DC | PRN

## 2017-09-28 MED ORDER — FAMOTIDINE 20 MG PO TABS
20.0000 mg | ORAL_TABLET | Freq: Once | ORAL | Status: AC
  Administered 2017-09-28: 20 mg via ORAL

## 2017-09-28 MED ORDER — ONDANSETRON HCL 4 MG/2ML IJ SOLN
INTRAMUSCULAR | Status: AC
  Filled 2017-09-28: qty 2

## 2017-09-28 MED ORDER — PROPOFOL 10 MG/ML IV BOLUS
INTRAVENOUS | Status: DC | PRN
  Administered 2017-09-28: 150 mg via INTRAVENOUS

## 2017-09-28 MED ORDER — LIDOCAINE 5 % EX OINT
2.0000 "application " | TOPICAL_OINTMENT | Freq: Four times a day (QID) | CUTANEOUS | Status: DC
  Filled 2017-09-28: qty 35.44

## 2017-09-28 MED ORDER — SUGAMMADEX SODIUM 500 MG/5ML IV SOLN
INTRAVENOUS | Status: DC | PRN
  Administered 2017-09-28: 500 mg via INTRAVENOUS

## 2017-09-28 MED ORDER — DEXAMETHASONE SODIUM PHOSPHATE 4 MG/ML IJ SOLN
INTRAMUSCULAR | Status: AC
  Filled 2017-09-28: qty 1

## 2017-09-28 MED ORDER — METFORMIN HCL 500 MG PO TABS
500.0000 mg | ORAL_TABLET | Freq: Two times a day (BID) | ORAL | Status: DC
  Filled 2017-09-28 (×2): qty 1

## 2017-09-28 MED ORDER — OXYCODONE HCL 5 MG/5ML PO SOLN: 5.0000 mg | Freq: Once | ORAL | Status: DC | PRN

## 2017-09-28 MED ORDER — PHENYLEPHRINE 40 MCG/ML (10ML) SYRINGE FOR IV PUSH (FOR BLOOD PRESSURE SUPPORT)
PREFILLED_SYRINGE | INTRAVENOUS | Status: AC
  Filled 2017-09-28: qty 10

## 2017-09-28 MED ORDER — BACITRACIN-NEOMYCIN-POLYMYXIN OINTMENT TUBE
TOPICAL_OINTMENT | Freq: Every day | CUTANEOUS | Status: DC
  Filled 2017-09-28: qty 14.17

## 2017-09-28 MED ORDER — CELECOXIB 200 MG PO CAPS
200.0000 mg | ORAL_CAPSULE | Freq: Once | ORAL | Status: AC
  Administered 2017-09-28: 200 mg via ORAL

## 2017-09-28 MED ORDER — MENTHOL 3 MG MT LOZG: 1.0000 | LOZENGE | OROMUCOSAL | Status: DC | PRN

## 2017-09-28 MED ORDER — SODIUM CHLORIDE 0.9 % IV SOLN
INTRAVENOUS | Status: DC | PRN
  Administered 2017-09-28: 60 mL

## 2017-09-28 MED ORDER — CIPROFLOXACIN IN D5W 400 MG/200ML IV SOLN
400.0000 mg | INTRAVENOUS | Status: AC
  Administered 2017-09-28: 400 mg via INTRAVENOUS
  Filled 2017-09-28: qty 200

## 2017-09-28 MED ORDER — PROPOFOL 10 MG/ML IV BOLUS
INTRAVENOUS | Status: AC
  Filled 2017-09-28: qty 20

## 2017-09-28 MED ORDER — SUGAMMADEX SODIUM 500 MG/5ML IV SOLN
INTRAVENOUS | Status: AC
  Filled 2017-09-28: qty 5

## 2017-09-28 MED ORDER — ACETAMINOPHEN 160 MG/5ML PO SOLN
975.0000 mg | Freq: Four times a day (QID) | ORAL | Status: AC | PRN
  Administered 2017-09-28: 975 mg via ORAL

## 2017-09-28 MED ORDER — ONDANSETRON HCL 4 MG/2ML IJ SOLN
INTRAMUSCULAR | Status: DC | PRN
  Administered 2017-09-28: 4 mg via INTRAVENOUS

## 2017-09-28 MED ORDER — ROCURONIUM BROMIDE 100 MG/10ML IV SOLN
INTRAVENOUS | Status: DC | PRN
  Administered 2017-09-28: 20 mg via INTRAVENOUS
  Administered 2017-09-28: 10 mg via INTRAVENOUS
  Administered 2017-09-28: 50 mg via INTRAVENOUS

## 2017-09-28 MED ORDER — GABAPENTIN 400 MG PO CAPS
800.0000 mg | ORAL_CAPSULE | Freq: Three times a day (TID) | ORAL | Status: DC
  Administered 2017-09-28: 800 mg via ORAL
  Filled 2017-09-28 (×2): qty 2

## 2017-09-28 MED ORDER — SODIUM CHLORIDE 0.9 % IJ SOLN
INTRAMUSCULAR | Status: AC
  Filled 2017-09-28: qty 50

## 2017-09-28 MED ORDER — CLINDAMYCIN PHOSPHATE 900 MG/50ML IV SOLN
900.0000 mg | INTRAVENOUS | Status: AC
  Administered 2017-09-28: 900 mg via INTRAVENOUS
  Filled 2017-09-28: qty 50

## 2017-09-28 MED ORDER — LACTATED RINGERS IV SOLN
INTRAVENOUS | Status: DC
  Administered 2017-09-28 (×2): via INTRAVENOUS

## 2017-09-28 MED ORDER — ACETAMINOPHEN 500 MG PO TABS: 1000.0000 mg | ORAL_TABLET | Freq: Once | ORAL | Status: DC

## 2017-09-28 MED ORDER — HYDROMORPHONE HCL 1 MG/ML IJ SOLN
0.2500 mg | INTRAMUSCULAR | Status: DC | PRN
  Administered 2017-09-28 (×4): 0.5 mg via INTRAVENOUS

## 2017-09-28 MED ORDER — PHENYLEPHRINE HCL 10 MG/ML IJ SOLN
INTRAMUSCULAR | Status: DC | PRN
  Administered 2017-09-28 (×2): 80 ug via INTRAVENOUS

## 2017-09-28 MED ORDER — PROMETHAZINE HCL 25 MG/ML IJ SOLN
INTRAMUSCULAR | Status: AC
  Administered 2017-09-28: 6.25 mg via INTRAVENOUS
  Filled 2017-09-28: qty 1

## 2017-09-28 SURGICAL SUPPLY — 57 items
APPLICATOR ARISTA FLEXITIP XL (MISCELLANEOUS) ×5 IMPLANT
BARRIER ADHS 3X4 INTERCEED (GAUZE/BANDAGES/DRESSINGS) ×5 IMPLANT
CANISTER SUCT 3000ML PPV (MISCELLANEOUS) ×5 IMPLANT
CATH FOLEY 2WAY SLVR  5CC 16FR (CATHETERS) ×1
CATH FOLEY 2WAY SLVR 5CC 16FR (CATHETERS) ×4 IMPLANT
CONT PATH 16OZ SNAP LID 3702 (MISCELLANEOUS) ×5 IMPLANT
COVER BACK TABLE 60X90IN (DRAPES) ×10 IMPLANT
COVER TIP SHEARS 8 DVNC (MISCELLANEOUS) ×4 IMPLANT
COVER TIP SHEARS 8MM DA VINCI (MISCELLANEOUS) ×1
DECANTER SPIKE VIAL GLASS SM (MISCELLANEOUS) ×10 IMPLANT
DEFOGGER SCOPE WARMER CLEARIFY (MISCELLANEOUS) ×5 IMPLANT
DERMABOND ADVANCED (GAUZE/BANDAGES/DRESSINGS) ×1
DERMABOND ADVANCED .7 DNX12 (GAUZE/BANDAGES/DRESSINGS) ×4 IMPLANT
DRSG OPSITE POSTOP 3X4 (GAUZE/BANDAGES/DRESSINGS) ×5 IMPLANT
DURAPREP 26ML APPLICATOR (WOUND CARE) ×5 IMPLANT
ELECT REM PT RETURN 9FT ADLT (ELECTROSURGICAL) ×5
ELECTRODE REM PT RTRN 9FT ADLT (ELECTROSURGICAL) ×4 IMPLANT
GLOVE BIO SURGEON STRL SZ7 (GLOVE) ×15 IMPLANT
GLOVE BIOGEL PI IND STRL 7.0 (GLOVE) ×8 IMPLANT
GLOVE BIOGEL PI INDICATOR 7.0 (GLOVE) ×2
GOWN STRL REUS W/TWL LRG LVL3 (GOWN DISPOSABLE) ×10 IMPLANT
HEMOSTAT ARISTA ABSORB 3G PWDR (MISCELLANEOUS) ×5 IMPLANT
KIT ACCESSORY DA VINCI DISP (KITS) ×1
KIT ACCESSORY DVNC DISP (KITS) ×4 IMPLANT
LEGGING LITHOTOMY PAIR STRL (DRAPES) ×5 IMPLANT
MANIPULATOR ADVINCU DEL 2.5 PL (MISCELLANEOUS) ×5 IMPLANT
MANIPULATOR ADVINCU DEL 3.0 PL (MISCELLANEOUS) IMPLANT
MANIPULATOR ADVINCU DEL 3.5 PL (MISCELLANEOUS) IMPLANT
MANIPULATOR ADVINCU DEL 4.0 PL (MISCELLANEOUS) IMPLANT
NEEDLE INSUFFLATION 120MM (ENDOMECHANICALS) ×5 IMPLANT
PACK ROBOT WH (CUSTOM PROCEDURE TRAY) ×5 IMPLANT
PACK ROBOTIC GOWN (GOWN DISPOSABLE) ×5 IMPLANT
PACK TRENDGUARD 450 HYBRID PRO (MISCELLANEOUS) ×4 IMPLANT
PACK TRENDGUARD 600 HYBRD PROC (MISCELLANEOUS) IMPLANT
PAD PREP 24X48 CUFFED NSTRL (MISCELLANEOUS) ×5 IMPLANT
POUCH LAPAROSCOPIC INSTRUMENT (MISCELLANEOUS) ×5 IMPLANT
PROTECTOR NERVE ULNAR (MISCELLANEOUS) ×10 IMPLANT
SET CYSTO W/LG BORE CLAMP LF (SET/KITS/TRAYS/PACK) ×5 IMPLANT
SET IRRIG TUBING LAPAROSCOPIC (IRRIGATION / IRRIGATOR) ×5 IMPLANT
SET TRI-LUMEN FLTR TB AIRSEAL (TUBING) ×5 IMPLANT
SUT DVC VLOC 180 0 12IN GS21 (SUTURE)
SUT VIC AB 2-0 CT2 27 (SUTURE) ×10 IMPLANT
SUT VIC AB 2-0 UR6 27 (SUTURE) ×5 IMPLANT
SUT VICRYL RAPIDE 3 0 (SUTURE) ×10 IMPLANT
SUT VLOC 180 0 9IN  GS21 (SUTURE) ×1
SUT VLOC 180 0 9IN GS21 (SUTURE) ×4 IMPLANT
SUTURE DVC VLC 180 0 12IN GS21 (SUTURE) IMPLANT
TIP RUMI ORANGE 6.7MMX12CM (TIP) IMPLANT
TOWEL OR 17X24 6PK STRL BLUE (TOWEL DISPOSABLE) ×15 IMPLANT
TRENDGUARD 450 HYBRID PRO PACK (MISCELLANEOUS) ×5
TRENDGUARD 600 HYBRID PROC PK (MISCELLANEOUS)
TROCAR DILATING TIP 12MM 150MM (ENDOMECHANICALS) ×5 IMPLANT
TROCAR DISP BLADELESS 8 DVNC (TROCAR) ×4 IMPLANT
TROCAR DISP BLADELESS 8MM (TROCAR) ×1
TROCAR PORT AIRSEAL 5X120 (TROCAR) IMPLANT
TROCAR PORT AIRSEAL 8X120 (TROCAR) IMPLANT
WATER STERILE IRR 1000ML POUR (IV SOLUTION) ×5 IMPLANT

## 2017-09-28 NOTE — Op Note (Signed)
09/28/2017  10:09 AM  PATIENT:  [name]  36 y.o. female  PRE-OPERATIVE DIAGNOSIS:  MENORRHAGIA PELVIC PAIN COMPLEX CYST  POST-OPERATIVE DIAGNOSIS:  MENORRHAGIA PELVIC PAIN COMPLEX CYST  PROCEDURE:  Procedure(s): CYSTOSCOPY (N/A) ROBOTIC ASSISTED TOTAL HYSTERECTOMY WITH BILATERAL SALPINGECTOMY (Bilateral) ROBOTIC ASSISTED LAPAROSCOPIC LYSIS OF ADHESION (N/A) OOPHORECTOMY (Left)  SURGEON:  Surgeon(s) and Role:    * Bobbye Charleston, MD - Primary    * Jerelyn Charles, MD - Assisting  ANESTHESIA:   general  EBL:  200 mL   LOCAL MEDICATIONS USED:  OTHER Ropivicaine, Arista, interceed.  SPECIMEN:  Source of Specimen:  uterus, cervix, bilateral tubes, L ovary  DISPOSITION OF SPECIMEN:  PATHOLOGY  COUNTS:  YES  TOURNIQUET:  * No tourniquets in log *  DICTATION: .Note written in EPIC  PLAN OF CARE: Admit for overnight observation  PATIENT DISPOSITION:  PACU - hemodynamically stable.   Delay start of Pharmacological VTE agent (>24hrs) due to surgical blood loss or risk of bleeding: not applicable  Complications:  None.  Findings:  10 weeks size uterus.  R ovary was normal.  L ovary had a small cyst.  There were omental adhesions to the anterior abdominal wall on the L over the uterus and ovary.  The ureters were identified during multiple points of the case and were always out of the field of dissection.  On cystoscopy, the bladder was intact and bilateral spill was seen from each ureteral orriface.    Technique:  After adequate anesthesia was achieved the patient was positioned, prepped and draped in usual sterile fashion.  A speculum was placed in the vagina and the cervix dilated with pratt dilators.  The 2.5 cm Jaquita Rector Advincula was assembled and placed in proper fashion.  The  Speculum was removed and the bladder catheterized with a foley.    Attention was turned to the abdomen where a 1 cm incision was made 1 cm above the umbilicus.  The veress needle was  introduced without aspiration of bowel contents or blood and the abdomen insufflated. The long 12 mm trocar was placed and the other three trocar sites were marked out, all approximately 10 cm from each other and the camera.  Two 8.5 mm trocars were placed on either side of the camera port and a 8 mm assistant port was placed 3 cm above the plane of the other trocars on the L.  All trocars were inserted under direct visualization of the camera.  The patient was placed in trendelenburg and then the Robot docked.  The PK forceps were placed on arm 2 and the Hot shears on arm 1 and introduced under direct visualization of the camera.  I then broke scrub and sat down at the console.  The above findings were noted and the ureters identified well out of the field of dissection.  The omental adhesions were taken down with sharp and cautery dissection. The right fallopian tube was isolated and cauterized with the PK.  The Utero-ovarian ligament was then divided with the PK cautery and shears.  The posterior broad ligament was then divided with the hot shears until the uterosacral ligament.  The Broad and cardinal ligaments were then cauterized against the cervix to the level of the Koh ring, securing the uterine artery.  Each pedicle was then incised with the shears.  The anterior leaf was then incised at the reflection of the vessico-uterine junction and the lateral bladder retracted inferiorly after the round ligament had been divided with the PK  forceps.  The left IP ligament was isolated and divided with the PK forceps and the scissors.  The round ligament was divided as well and the posterior leaf of the broad ligament then divided with the hot shears. The broad and cardinal ligaments were then cauterized on the left in the same way.   At the level of the internal os, the uterine arteries were bilaterally cauterized with the PK.  The ureters were identified well out of the field of dissection.  .   The bladder was  then able to be retracted inferiorly and the vesico-uterine fascia was incised in the midline until the bladder was removed one cm below the Koh ring.  The hot shears then circumferentially incised the vagina at the level of the reflection on the [facility] ring.  Once the uterus and cervix were amputated, cautery was used to insure hemostasis of the cuff.  Once hemostasis was achieved, the instruments were changed to the mega needle driver and mega suture cut needle driver and the cuff was closed with a running stitches of 0-vicryl V loc.    Cautery was used to ensure hemostasis of the left pedicles very superficially.  The ureters were peristalsing bilaterally well and very lateral to the areas of operation.    The Robot was then undocked and I scrubbed back in.  The needle was removed and Interceed, Arista and Ropivicaine was introduced into the pelvis.  The fascia of the 12 mm trocar was closed with a figure of 8 stitch of 0 vicryl.  The skin incisions were closed with subcuticular stitches of 3-0 vicryl Rapide and Dermabond.  All instruments were removed from the vagina and cystoscopy performed, revealing an intact bladder and vigourous spill of indigo carmine from each ureteral orifice.  The cystoscope was removed and the patient taken to the recovery room in stable condition.  Cambre Matson A

## 2017-09-28 NOTE — Anesthesia Postprocedure Evaluation (Signed)
Anesthesia Post Note  Patient: Ahmoni Edge Cheaney  Procedure(s) Performed: CYSTOSCOPY (N/A Bladder) ROBOTIC ASSISTED TOTAL HYSTERECTOMY WITH BILATERAL SALPINGECTOMY (Bilateral Abdomen) ROBOTIC ASSISTED LAPAROSCOPIC LYSIS OF ADHESION (N/A Abdomen) OOPHORECTOMY (Left Abdomen)     Patient location during evaluation: [facility] Anesthesia Type: General Level of consciousness: awake, awake and alert, oriented and patient cooperative Pain management: satisfactory to patient Vital Signs Assessment: post-procedure vital signs reviewed and stable Respiratory status: spontaneous breathing, nonlabored ventilation, respiratory function stable and patient connected to nasal cannula oxygen Cardiovascular status: stable Postop Assessment: no apparent nausea or vomiting Anesthetic complications: no    Last Vitals:  Vitals:   09/28/17 1230 09/28/17 1255  BP:  122/75  Pulse: 77 78  Resp: 11 20  Temp:  36.7 C  SpO2: 98% 98%    Last Pain:  Vitals:   09/28/17 1255  TempSrc: Oral  PainSc:    Pain Goal: Patients Stated Pain Goal: 5 (09/28/17 1145)               Juanice Warburton L

## 2017-09-28 NOTE — Anesthesia Procedure Notes (Signed)
Procedure Name: Intubation Date/Time: 09/28/2017 7:32 AM Performed by: Jonna Munro, CRNA Pre-anesthesia Checklist: Patient identified, Emergency Drugs available, Suction available, Patient being monitored and Timeout performed Patient Re-evaluated:Patient Re-evaluated prior to induction Oxygen Delivery Method: Circle system utilized Preoxygenation: Pre-oxygenation with 100% oxygen Induction Type: IV induction Ventilation: Mask ventilation without difficulty Laryngoscope Size: Mac and 3 Grade View: Grade I Tube type: Oral Tube size: 7.0 mm Number of attempts: 1 Placement Confirmation: ETT inserted through vocal cords under direct vision,  positive ETCO2 and breath sounds checked- equal and bilateral Secured at: 22 cm Tube secured with: Tape Dental Injury: Teeth and Oropharynx as per pre-operative assessment

## 2017-09-28 NOTE — Brief Op Note (Signed)
09/28/2017  10:09 AM  PATIENT:  [name]  36 y.o. female  PRE-OPERATIVE DIAGNOSIS:  MENORRHAGIA PELVIC PAIN COMPLEX CYST  POST-OPERATIVE DIAGNOSIS:  MENORRHAGIA PELVIC PAIN COMPLEX CYST  PROCEDURE:  Procedure(s): CYSTOSCOPY (N/A) ROBOTIC ASSISTED TOTAL HYSTERECTOMY WITH BILATERAL SALPINGECTOMY (Bilateral) ROBOTIC ASSISTED LAPAROSCOPIC LYSIS OF ADHESION (N/A) OOPHORECTOMY (Left)  SURGEON:  Surgeon(s) and Role:    * Bobbye Charleston, MD - Primary    * Jerelyn Charles, MD - Assisting  ANESTHESIA:   general  EBL:  200 mL   LOCAL MEDICATIONS USED:  OTHER Ropivicaine, Arista, interceed.  SPECIMEN:  Source of Specimen:  uterus, cervix, bilateral tubes, L ovary  DISPOSITION OF SPECIMEN:  PATHOLOGY  COUNTS:  YES  TOURNIQUET:  * No tourniquets in log *  DICTATION: .Note written in EPIC  PLAN OF CARE: Admit for overnight observation  PATIENT DISPOSITION:  PACU - hemodynamically stable.   Delay start of Pharmacological VTE agent (>24hrs) due to surgical blood loss or risk of bleeding: not applicable  Complications:  None.  Findings:  10 weeks size uterus.  R ovary was normal.  L ovary had a small cyst.  The ureters were identified during multiple points of the case and were always out of the field of dissection.  On cystoscopy, the bladder was intact and bilateral spill was seen from each ureteral orriface.    Technique:  After adequate anesthesia was achieved the patient was positioned, prepped and draped in usual sterile fashion.  A speculum was placed in the vagina and the cervix dilated with pratt dilators.  The 2.5 cm [name] was assembled and placed in proper fashion.  The  Speculum was removed and the bladder catheterized with a foley.    Attention was turned to the abdomen where a 1 cm incision was made 1 cm above the umbilicus.  The veress needle was introduced without aspiration of bowel contents or blood and the abdomen insufflated. The long 12 mm  trocar was placed and the other three trocar sites were marked out, all approximately 10 cm from each other and the camera.  Two 8.5 mm trocars were placed on either side of the camera port and a 8 mm assistant port was placed 3 cm above the plane of the other trocars on the L.  All trocars were inserted under direct visualization of the camera.  The patient was placed in trendelenburg and then the Robot docked.  The PK forceps were placed on arm 2 and the Hot shears on arm 1 and introduced under direct visualization of the camera.  I then broke scrub and sat down at the console.  The above findings were noted and the ureters identified well out of the field of dissection.  The right fallopian tube was isolated and cauterized with the PK.  The Utero-ovarian ligament was then divided with the PK cautery and shears.  The posterior broad ligament was then divided with the hot shears until the uterosacral ligament.  The Broad and cardinal ligaments were then cauterized against the cervix to the level of the Koh ring, securing the uterine artery.  Each pedicle was then incised with the shears.  The anterior leaf was then incised at the reflection of the vessico-uterine junction and the lateral bladder retracted inferiorly after the round ligament had been divided with the PK forceps.  The left IP ligament was isolated and divided with the PK forceps and the scissors.  The round ligament was divided as well and the posterior leaf  of the broad ligament then divided with the hot shears. The broad and cardinal ligaments were then cauterized on the left in the same way.   At the level of the internal os, the uterine arteries were bilaterally cauterized with the PK.  The ureters were identified well out of the field of dissection.  .   The bladder was then able to be retracted inferiorly and the vesico-uterine fascia was incised in the midline until the bladder was removed one cm below the Koh ring.  The hot shears then  circumferentially incised the vagina at the level of the reflection on the [facility] ring.  Once the uterus and cervix were amputated, cautery was used to insure hemostasis of the cuff.  Once hemostasis was achieved, the instruments were changed to the mega needle driver and mega suture cut needle driver and the cuff was closed with a running stitches of 0-vicryl V loc.    Cautery was used to ensure hemostasis of the left pedicles very superficially.  The ureters were peristalsing bilaterally well and very lateral to the areas of operation.    The Robot was then undocked and I scrubbed back in.  The needle was removed and Interceed, Arista and Ropivicaine was introduced into the pelvis.  The fascia of the 12 mm trocar was closed with a figure of 8 stitch of 0 vicryl.  The skin incisions were closed with subcuticular stitches of 3-0 vicryl Rapide and Dermabond.  All instruments were removed from the vagina and cystoscopy performed, revealing an intact bladder and vigourous spill of indigo carmine from each ureteral orifice.  The cystoscope was removed and the patient taken to the recovery room in stable condition.  [name] A

## 2017-09-28 NOTE — Transfer of Care (Signed)
Immediate Anesthesia Transfer of Care Note  Patient: [name]  Procedure(s) Performed: CYSTOSCOPY (N/A Bladder) ROBOTIC ASSISTED TOTAL HYSTERECTOMY WITH BILATERAL SALPINGECTOMY (Bilateral Abdomen) ROBOTIC ASSISTED LAPAROSCOPIC LYSIS OF ADHESION (N/A Abdomen) OOPHORECTOMY (Left Abdomen)  Patient Location: PACU  Anesthesia Type:General  Level of Consciousness: awake, alert  and oriented  Airway & Oxygen Therapy: Patient Spontanous Breathing and Patient connected to nasal cannula oxygen  Post-op Assessment: Report given to RN and Post -op Vital signs reviewed and stable  Post vital signs: Reviewed and stable  Last Vitals:  Vitals:   09/28/17 0603  BP: (!) 139/95  Pulse: 69  Resp: 16  Temp: 36.6 C  SpO2: 99%    Last Pain:  Vitals:   09/28/17 0603  TempSrc: Oral      Patients Stated Pain Goal: 5 (48/54/62 7035)  Complications: No apparent anesthesia complications

## 2017-09-28 NOTE — Anesthesia Preprocedure Evaluation (Addendum)
Anesthesia Evaluation  Patient identified by MRN, date of birth, ID band Patient awake    Reviewed: Allergy & Precautions, NPO status , Patient's Chart, lab work & pertinent test results  History of Anesthesia Complications Negative for: history of anesthetic complications  Airway Mallampati: II  TM Distance: >3 FB Neck ROM: Full    Dental  (+) Dental Advisory Given, Chipped,    Pulmonary Current Smoker,    breath sounds clear to auscultation       Cardiovascular hypertension, Pt. on medications (-) angina Rhythm:Regular Rate:Normal     Neuro/Psych  Headaches, PSYCHIATRIC DISORDERS    GI/Hepatic Neg liver ROS, GERD  Controlled,  Endo/Other  diabetes, Oral Hypoglycemic Agents  Renal/GU negative Renal ROS     Musculoskeletal   Abdominal (+) + obese,   Peds  Hematology negative hematology ROS (+)   Anesthesia Other Findings MENORRHAGIA PELVIC OAIN COMPLEX CYST  Reproductive/Obstetrics                            Anesthesia Physical  Anesthesia Plan  ASA: III  Anesthesia Plan: General   Post-op Pain Management:    Induction: Intravenous  PONV Risk Score and Plan: 2 and Midazolam, Dexamethasone and Ondansetron  Airway Management Planned: Oral ETT  Additional Equipment:   Intra-op Plan:   Post-operative Plan: Extubation in OR  Informed Consent: I have reviewed the patients History and Physical, chart, labs and discussed the procedure including the risks, benefits and alternatives for the proposed anesthesia with the patient or authorized representative who has indicated his/her understanding and acceptance.   Dental advisory given  Plan Discussed with: CRNA  Anesthesia Plan Comments:        Anesthesia Quick Evaluation

## 2017-09-28 NOTE — Progress Notes (Signed)
There has been no change in the patients history, status or exam since the history and physical.  Vitals:   09/28/17 0603  BP: (!) 139/95  Pulse: 69  Resp: 16  Temp: 97.9 F (36.6 C)  TempSrc: Oral  SpO2: 99%    Lab Results  Component Value Date   WBC 8.9 09/15/2017   HGB 12.6 09/15/2017   HCT 39.5 09/15/2017   MCV 78.2 09/15/2017   PLT 276 09/15/2017  UPT neg  [name] A

## 2017-09-29 ENCOUNTER — Encounter (HOSPITAL_COMMUNITY): Payer: Self-pay | Admitting: Obstetrics and Gynecology

## 2017-09-29 DIAGNOSIS — N920 Excessive and frequent menstruation with regular cycle: Secondary | ICD-10-CM | POA: Diagnosis not present

## 2017-09-29 MED ORDER — OXYCODONE-ACETAMINOPHEN 5-325 MG PO TABS: 1.0000 | ORAL_TABLET | ORAL | 0 refills | Status: DC | PRN

## 2017-09-29 NOTE — Progress Notes (Signed)
Patient is eating, ambulating, and voiding.  Pain control is good.  BP 130/70 (BP Location: Right Arm)   Pulse 66   Temp 98.4 F (36.9 C) (Oral)   Resp 17   Ht 5\' 6"  (1.676 m)   Wt 194 lb (88 kg)   LMP 09/11/2017 (Approximate)   SpO2 93%   BMI 31.31 kg/m   lungs:   clear to auscultation cor:    RRR Abdomen:  soft, appropriate tenderness, incisions intact and without erythema or exudate. ex:    no cords   Lab Results  Component Value Date   WBC 8.9 09/15/2017   HGB 12.6 09/15/2017   HCT 39.5 09/15/2017   MCV 78.2 09/15/2017   PLT 276 09/15/2017    A/P  Routine care.  Expect d/c per plan.

## 2017-09-29 NOTE — Progress Notes (Signed)
Pt discharged with printed instructions. Pt verbalized an understanding. No concerns noted. Fionn Stracke L Brandalyn Harting, RN 

## 2017-10-02 ENCOUNTER — Inpatient Hospital Stay (HOSPITAL_COMMUNITY): Payer: Medicaid Other

## 2017-10-02 ENCOUNTER — Inpatient Hospital Stay (HOSPITAL_COMMUNITY)
Admission: AD | Admit: 2017-10-02 | Discharge: 2017-10-03 | Disposition: A | Discharge status: Home / Self Care | Payer: Medicaid Other | Source: Ambulatory Visit | Attending: Obstetrics and Gynecology | Admitting: Obstetrics and Gynecology

## 2017-10-02 ENCOUNTER — Other Ambulatory Visit: Payer: Self-pay

## 2017-10-02 ENCOUNTER — Encounter (HOSPITAL_COMMUNITY): Payer: Self-pay

## 2017-10-02 DIAGNOSIS — I10 Essential (primary) hypertension: Secondary | ICD-10-CM | POA: Insufficient documentation

## 2017-10-02 DIAGNOSIS — Z880 Allergy status to penicillin: Secondary | ICD-10-CM | POA: Insufficient documentation

## 2017-10-02 DIAGNOSIS — D649 Anemia, unspecified: Secondary | ICD-10-CM | POA: Diagnosis not present

## 2017-10-02 DIAGNOSIS — N1330 Unspecified hydronephrosis: Secondary | ICD-10-CM | POA: Insufficient documentation

## 2017-10-02 DIAGNOSIS — Z7984 Long term (current) use of oral hypoglycemic drugs: Secondary | ICD-10-CM | POA: Insufficient documentation

## 2017-10-02 DIAGNOSIS — E119 Type 2 diabetes mellitus without complications: Secondary | ICD-10-CM | POA: Diagnosis not present

## 2017-10-02 DIAGNOSIS — Z8614 Personal history of Methicillin resistant Staphylococcus aureus infection: Secondary | ICD-10-CM | POA: Diagnosis not present

## 2017-10-02 DIAGNOSIS — F17210 Nicotine dependence, cigarettes, uncomplicated: Secondary | ICD-10-CM | POA: Insufficient documentation

## 2017-10-02 DIAGNOSIS — R1031 Right lower quadrant pain: Secondary | ICD-10-CM

## 2017-10-02 DIAGNOSIS — Z90710 Acquired absence of both cervix and uterus: Secondary | ICD-10-CM

## 2017-10-02 HISTORY — DX: Right lower quadrant pain: R10.31

## 2017-10-02 LAB — CBC WITH DIFFERENTIAL/PLATELET
Basophils Absolute: 0 10*3/uL (ref 0.0–0.1)
Basophils Relative: 0 %
Eosinophils Absolute: 1.2 10*3/uL — ABNORMAL HIGH (ref 0.0–0.7)
Eosinophils Relative: 9 %
HCT: 37.7 % (ref 36.0–46.0)
Hemoglobin: 12.2 g/dL (ref 12.0–15.0)
Lymphocytes Relative: 17 %
Lymphs Abs: 2.2 10*3/uL (ref 0.7–4.0)
MCH: 25.4 pg — ABNORMAL LOW (ref 26.0–34.0)
MCHC: 32.4 g/dL (ref 30.0–36.0)
MCV: 78.5 fL (ref 78.0–100.0)
Monocytes Absolute: 0.5 10*3/uL (ref 0.1–1.0)
Monocytes Relative: 4 %
Neutro Abs: 8.9 10*3/uL — ABNORMAL HIGH (ref 1.7–7.7)
Neutrophils Relative %: 70 %
Platelets: 307 10*3/uL (ref 150–400)
RBC: 4.8 MIL/uL (ref 3.87–5.11)
RDW: 18 % — ABNORMAL HIGH (ref 11.5–15.5)
WBC: 12.9 10*3/uL — ABNORMAL HIGH (ref 4.0–10.5)

## 2017-10-02 LAB — URINALYSIS, ROUTINE W REFLEX MICROSCOPIC
Bacteria, UA: NONE SEEN
Bilirubin Urine: NEGATIVE
Glucose, UA: 50 mg/dL — AB
Ketones, ur: NEGATIVE mg/dL
Leukocytes, UA: NEGATIVE
Nitrite: NEGATIVE
Protein, ur: NEGATIVE mg/dL
Specific Gravity, Urine: 1.009 (ref 1.005–1.030)
pH: 6 (ref 5.0–8.0)

## 2017-10-02 LAB — COMPREHENSIVE METABOLIC PANEL
ALT: 15 U/L (ref 14–54)
AST: 16 U/L (ref 15–41)
Albumin: 3.6 g/dL (ref 3.5–5.0)
Alkaline Phosphatase: 67 U/L (ref 38–126)
Anion gap: 9 (ref 5–15)
BUN: 9 mg/dL (ref 6–20)
CO2: 26 mmol/L (ref 22–32)
Calcium: 9.3 mg/dL (ref 8.9–10.3)
Chloride: 100 mmol/L — ABNORMAL LOW (ref 101–111)
Creatinine, Ser: 0.64 mg/dL (ref 0.44–1.00)
GFR calc Af Amer: >60 mL/min (ref >60)
GFR calc non Af Amer: >60 mL/min (ref >60)
Glucose, Bld: 142 mg/dL — ABNORMAL HIGH (ref 65–99)
Potassium: 3.5 mmol/L (ref 3.5–5.1)
Sodium: 135 mmol/L (ref 135–145)
Total Bilirubin: 0.7 mg/dL (ref 0.3–1.2)
Total Protein: 7.5 g/dL (ref 6.5–8.1)

## 2017-10-02 MED ORDER — PROMETHAZINE HCL 25 MG/ML IJ SOLN: 25.0000 mg | INTRAMUSCULAR | Status: DC | PRN

## 2017-10-02 MED ORDER — LOSARTAN POTASSIUM 25 MG PO TABS
25.0000 mg | ORAL_TABLET | Freq: Every day | ORAL | Status: DC
  Administered 2017-10-02 – 2017-10-03 (×2): 25 mg via ORAL
  Filled 2017-10-02 (×3): qty 1

## 2017-10-02 MED ORDER — OXYCODONE-ACETAMINOPHEN 5-325 MG PO TABS: 1.0000 | ORAL_TABLET | ORAL | Status: DC | PRN

## 2017-10-02 MED ORDER — METOCLOPRAMIDE HCL 5 MG/ML IJ SOLN: 10.0000 mg | Freq: Once | INTRAMUSCULAR | Status: DC

## 2017-10-02 MED ORDER — LACTATED RINGERS IV SOLN
INTRAVENOUS | Status: DC
Start: 2017-10-02 — End: 2017-10-03
  Administered 2017-10-02 – 2017-10-03 (×4): via INTRAVENOUS

## 2017-10-02 MED ORDER — IOPAMIDOL (ISOVUE-300) INJECTION 61%
30.0000 mL | INTRAVENOUS | Status: DC
  Administered 2017-10-02: 30 mL via ORAL

## 2017-10-02 MED ORDER — DOCUSATE SODIUM 100 MG PO CAPS
100.0000 mg | ORAL_CAPSULE | Freq: Two times a day (BID) | ORAL | Status: DC
  Administered 2017-10-02 (×2): 100 mg via ORAL
  Filled 2017-10-02 (×2): qty 1

## 2017-10-02 MED ORDER — FLEET ENEMA 7-19 GM/118ML RE ENEM
1.0000 | ENEMA | Freq: Once | RECTAL | Status: AC
  Administered 2017-10-02: 1 via RECTAL

## 2017-10-02 MED ORDER — PROMETHAZINE HCL 25 MG/ML IJ SOLN
25.0000 mg | Freq: Once | INTRAMUSCULAR | Status: AC
  Administered 2017-10-02: 25 mg via INTRAVENOUS
  Filled 2017-10-02: qty 1

## 2017-10-02 MED ORDER — BUTORPHANOL TARTRATE 1 MG/ML IJ SOLN
2.0000 mg | INTRAMUSCULAR | Status: DC | PRN
  Administered 2017-10-02 – 2017-10-03 (×4): 2 mg via INTRAVENOUS
  Filled 2017-10-02 (×4): qty 2

## 2017-10-02 MED ORDER — IOPAMIDOL (ISOVUE-300) INJECTION 61%
100.0000 mL | Freq: Once | INTRAVENOUS | Status: AC | PRN
  Administered 2017-10-02: 100 mL via INTRAVENOUS

## 2017-10-02 NOTE — MAU Provider Note (Signed)
History     CSN: [id number]  Arrival date and time: 10/02/17 7517   First Provider Initiated Contact with Patient 10/02/17 [phone]      Chief Complaint  Patient presents with  . Abdominal Pain  . Constipation   HPI  [name] is a 36 y.o. [phone] non pregnant female who presents with post op pain & constipation. Patient is 4 days s/p RAH by Dr. Philis Pique. Reports no BM since Tuesday. Took a laxative last night without relief. Reports vomiting when she eats but is able to keep down water. Vomitus consists of undigested food & bile. No nausea if she's not eating. Endorses constant low back pain & right sided abdominal pain. Describes as pressure & sharp pain. Rates pain 8/10. Previously taking percocet without relief; last dose was yesterday. Pain worse with movement & lying on her back. Denies fever/chills. Has not passed flatus since surgery.   Past Medical History:  Diagnosis Date  . Abdominal lipoma 05/2015  . Anemia   . Arthritis    lower back  . Diabetes mellitus without complication ([place])   . GERD (gastroesophageal reflux disease)   . Headache    Migraines  . History of MRSA infection ~ 2006   leg  . Hypertension    under control with med., has been on med. x 6 mos.  . Mood swing   . Nerve pain    leg and feet bilateral  . Optic nerve swelling   . Psoriasis     Past Surgical History:  Procedure Laterality Date  . CESAREAN SECTION  06/21/2008; 08/18/2010  . CESAREAN SECTION N/A 07/10/2014   Procedure: REPEAT CESAREAN SECTION;  Surgeon: Daria Pastures, MD;  Location: [facility];  Service: Obstetrics;  Laterality: N/A;  . CHOLECYSTECTOMY  07/10/2008  . COLONOSCOPY    . CYSTOSCOPY N/A 09/28/2017   Procedure: CYSTOSCOPY;  Surgeon: Bobbye Charleston, MD;  Location: [facility];  Service: Gynecology;  Laterality: N/A;  . LIPOMA EXCISION  2014   back  . LIPOMA EXCISION N/A 05/21/2015   Procedure: EXCISION LIPOMAS ON ABDOMEN;  Surgeon: Erroll Luna, MD;  Location: [facility];  Service: General;  Laterality: N/A;  . LUMBAR PUNCTURE  09/19/17   [facility], Dr. Curt Bears  . OOPHORECTOMY Left 09/28/2017   Procedure: OOPHORECTOMY;  Surgeon: Bobbye Charleston, MD;  Location: [facility];  Service: Gynecology;  Laterality: Left;  . ROBOTIC ASSISTED LAPAROSCOPIC LYSIS OF ADHESION N/A 09/28/2017   Procedure: ROBOTIC ASSISTED LAPAROSCOPIC LYSIS OF ADHESION;  Surgeon: Bobbye Charleston, MD;  Location: [facility];  Service: Gynecology;  Laterality: N/A;  . ROBOTIC ASSISTED TOTAL HYSTERECTOMY WITH BILATERAL SALPINGO OOPHERECTOMY Bilateral 09/28/2017   Procedure: ROBOTIC ASSISTED TOTAL HYSTERECTOMY WITH BILATERAL SALPINGECTOMY;  Surgeon: Bobbye Charleston, MD;  Location: [facility];  Service: Gynecology;  Laterality: Bilateral;  . TUBAL LIGATION    . WISDOM TOOTH EXTRACTION      Family History  Problem Relation Age of Onset  . Drug abuse Mother   . Heart disease Maternal Grandmother   . Heart disease Maternal Grandfather     Social History   Tobacco Use  . Smoking status: Current Some Day Smoker    Packs/day: 0.50    Years: 26.00    Pack years: 13.00    Types: Cigarettes    Start date: 05/01/2014  . Smokeless tobacco: Never Used  Substance Use Topics  . Alcohol use: No    Alcohol/week: 0.0 oz  . Drug use: No  Allergies:  Allergies  Allergen Reactions  . Bee Venom Anaphylaxis  . Penicillins Anaphylaxis and Other (See Comments)    Has patient had a PCN reaction causing immediate rash, facial/tongue/throat swelling, SOB or lightheadedness with hypotension: Yes Has patient had a PCN reaction causing severe rash involving mucus membranes or skin necrosis: No Has patient had a PCN reaction that required hospitalization: Yes Has patient had a PCN reaction occurring within the last 10 years: No If all of the above answers are "NO", then may proceed with Cephalosporin use.   . Mushroom Extract Complex Nausea And Vomiting  . Powder Itching, Rash and  Other (See Comments)    The powder from latex gloves    Medications Prior to Admission  Medication Sig Dispense Refill Last Dose  . Coal Tar Extract [phone] PSORIASIS MULTI-SYMPTOM EX) Apply 1 application as needed topically (for psoriasis).   Past Week at Unknown time  . Diclofenac Sodium 3 % GEL Apply 2-3 g 4 (four) times daily topically.  0 Past Week at Unknown time  . Doxepin HCl 5 % CREA Apply 2 g 3 (three) times daily topically.  0 09/27/2017 at Unknown time  . ferrous sulfate 325 (65 FE) MG tablet Take 1 tablet (325 mg total) by mouth daily. 30 tablet 2 09/27/2017 at Unknown time  . gabapentin (NEURONTIN) 800 MG tablet Take 1 tablet (800 mg total) by mouth 3 (three) times daily. 90 tablet 6 Past Month at Unknown time  . lidocaine (XYLOCAINE) 5 % ointment Apply 2-3 g 4 (four) times daily topically.  0 Past Week at Unknown time  . losartan (COZAAR) 25 MG tablet Take 1 tablet (25 mg total) daily by mouth. 30 tablet 2 09/27/2017 at Unknown time  . metFORMIN (GLUCOPHAGE) 500 MG tablet Take 1 tablet (500 mg total) by mouth 2 (two) times daily with a meal. 180 tablet 3 09/27/2017 at Unknown time  . oxyCODONE-acetaminophen (PERCOCET/ROXICET) 5-325 MG tablet Take 1-2 tablets by mouth every 4 (four) hours as needed for severe pain (moderate to severe pain (when tolerating fluids)). 30 tablet 0   . rosuvastatin (CRESTOR) 20 MG tablet Take 1 tablet (20 mg total) by mouth daily. 90 tablet 2 09/27/2017 at Unknown time  . sertraline (ZOLOFT) 100 MG tablet Take 100 mg daily by mouth.   09/27/2017 at Unknown time  . vitamin B-12 (CYANOCOBALAMIN) 1000 MCG tablet Take 1,000 mcg daily by mouth.   09/27/2017 at Unknown time    Review of Systems  Constitutional: Positive for appetite change. Negative for chills and fever.  Gastrointestinal: Positive for abdominal distention, abdominal pain, constipation and vomiting. Negative for diarrhea and nausea.  Musculoskeletal: Positive for back pain.   Physical Exam    Blood pressure (!) 157/101, pulse 92, temperature 98.2 F (36.8 C), resp. rate 18, last menstrual period 09/11/2017.  Physical Exam  Nursing note and vitals reviewed. Constitutional: She is oriented to person, place, and time. She appears well-developed and well-nourished. No distress.  HENT:  Head: Normocephalic and atraumatic.  Eyes: Conjunctivae are normal. Right eye exhibits no discharge. Left eye exhibits no discharge. No scleral icterus.  Neck: Normal range of motion.  Cardiovascular: Normal rate, regular rhythm and normal heart sounds.  No murmur heard. Respiratory: Effort normal and breath sounds normal. No respiratory distress. She has no wheezes.  GI: Soft. She exhibits distension. She exhibits no mass. There is tenderness in the right upper quadrant and right lower quadrant. There is rebound, guarding and CVA tenderness (right). There is  no rigidity.  Decrease BS in RUQ & LUQ. Absent BS in RLQ & LLQ.   Neurological: She is alert and oriented to person, place, and time.  Skin: Skin is warm and dry. She is not diaphoretic.  Psychiatric: She has a normal mood and affect. Her behavior is normal. Judgment and thought content normal.    MAU Course  Procedures Results for orders placed or performed during the hospital encounter of 10/02/17 (from the past 24 hour(s))  Urinalysis, Routine w reflex microscopic     Status: Abnormal   Collection Time: 10/02/17  8:47 AM  Result Value Ref Range   Color, Urine YELLOW YELLOW   APPearance CLEAR CLEAR   Specific Gravity, Urine 1.009 1.005 - 1.030   pH 6.0 5.0 - 8.0   Glucose, UA 50 (A) NEGATIVE mg/dL   Hgb urine dipstick SMALL (A) NEGATIVE   Bilirubin Urine NEGATIVE NEGATIVE   Ketones, ur NEGATIVE NEGATIVE mg/dL   Protein, ur NEGATIVE NEGATIVE mg/dL   Nitrite NEGATIVE NEGATIVE   Leukocytes, UA NEGATIVE NEGATIVE   RBC / HPF 0-5 0 - 5 RBC/hpf   WBC, UA 0-5 0 - 5 WBC/hpf   Bacteria, UA NONE SEEN NONE SEEN   Squamous Epithelial / LPF  0-5 (A) NONE SEEN  Comprehensive metabolic panel     Status: Abnormal   Collection Time: 10/02/17  9:31 AM  Result Value Ref Range   Sodium 135 135 - 145 mmol/L   Potassium 3.5 3.5 - 5.1 mmol/L   Chloride 100 (L) 101 - 111 mmol/L   CO2 26 22 - 32 mmol/L   Glucose, Bld 142 (H) 65 - 99 mg/dL   BUN 9 6 - 20 mg/dL   Creatinine, Ser 0.64 0.44 - 1.00 mg/dL   Calcium 9.3 8.9 - 10.3 mg/dL   Total Protein 7.5 6.5 - 8.1 g/dL   Albumin 3.6 3.5 - 5.0 g/dL   AST 16 15 - 41 U/L   ALT 15 14 - 54 U/L   Alkaline Phosphatase 67 38 - 126 U/L   Total Bilirubin 0.7 0.3 - 1.2 mg/dL   GFR calc non Af Amer >60 >60 mL/min   GFR calc Af Amer >60 >60 mL/min   Anion gap 9 5 - 15  CBC with Differential/Platelet     Status: Abnormal   Collection Time: 10/02/17  9:31 AM  Result Value Ref Range   WBC 12.9 (H) 4.0 - 10.5 K/uL   RBC 4.80 3.87 - 5.11 MIL/uL   Hemoglobin 12.2 12.0 - 15.0 g/dL   HCT 37.7 36.0 - 46.0 %   MCV 78.5 78.0 - 100.0 fL   MCH 25.4 (L) 26.0 - 34.0 pg   MCHC 32.4 30.0 - 36.0 g/dL   RDW 18.0 (H) 11.5 - 15.5 %   Platelets 307 150 - 400 K/uL   Neutrophils Relative % 70 %   Neutro Abs 8.9 (H) 1.7 - 7.7 K/uL   Lymphocytes Relative 17 %   Lymphs Abs 2.2 0.7 - 4.0 K/uL   Monocytes Relative 4 %   Monocytes Absolute 0.5 0.1 - 1.0 K/uL   Eosinophils Relative 9 %   Eosinophils Absolute 1.2 (H) 0.0 - 0.7 K/uL   Basophils Relative 0 %   Basophils Absolute 0.0 0.0 - 0.1 K/uL   Ct Abdomen Pelvis W Contrast  Result Date: 10/02/2017 CLINICAL DATA:  Status post hysterectomy. Abdominal pain, distention EXAM: CT ABDOMEN AND PELVIS WITH CONTRAST TECHNIQUE: Multidetector CT imaging of the abdomen and pelvis was  performed using the standard protocol following bolus administration of intravenous contrast. CONTRAST:  169mL ISOVUE-300 IOPAMIDOL (ISOVUE-300) INJECTION 61%, <See Chart> ISOVUE-300 IOPAMIDOL (ISOVUE-300) INJECTION 61% COMPARISON:  None. FINDINGS: Lower chest: Bibasilar atelectasis or infiltrates,  right greater than left. Heart is normal size. No effusions. Hepatobiliary: Prior cholecystectomy. Suspect mild fatty infiltration of the liver. No focal hepatic abnormality. Pancreas: No focal abnormality or ductal dilatation. Spleen: No focal abnormality.  Normal size. Adrenals/Urinary Tract: There is mild right hydronephrosis and hydroureter with slight delayed excretion of contrast from the right kidney. The right ureter is dilated into the pelvis where becomes difficult to follow adjacent to the right ovary. No stones visualized. Adrenal glands, left kidney and urinary bladder unremarkable. Stomach/Bowel: Stomach, large and small bowel grossly unremarkable. Moderate stool in the right colon. Appendix visualized, normal. Vascular/Lymphatic: No evidence of aneurysm or adenopathy. Reproductive: Prior hysterectomy. Small collapsing follicle or functional cyst in the right ovary. No adnexal masses. Other: There is stranding in the lower pelvis, likely related to recent hysterectomy. Locules of peritoneal or extraperitoneal gas noted in the left pelvis and extending into the left groin. Gas also noted within the right anterior abdominal wall. These findings are presumably related to recent surgery. Musculoskeletal: No acute bony abnormality. IMPRESSION: Postoperative changes from recent hysterectomy. Stranding in the pelvis is likely related to recent postoperative state. Locules of peritoneal or extraperitoneal gas in the left pelvis and left groin. Gas within the right anterior abdominal wall and abdominal wall musculature. Mild right hydronephrosis and slight delayed excretion of contrast from the right kidney. The right ureter is dilated into the pelvis, but not visualized well beyond the right ovary. No visible obstructing stones. Suspect mild fatty infiltration of the liver. Bibasilar airspace opacities, right greater than left, atelectasis versus infiltrates. Electronically Signed   By: Rolm Baptise M.D.   On:  10/02/2017 11:40     MDM Afebrile Abdominal distension & w/decreased-absent BS. TTP with rebound & guarding in RLQ/RUQ. D/w Dr. Philis Pique -- will obtain CT.  IV LR infusion. CBC, CMP pending. Pt declines antiemetic at this time. Will keep NPO.   Dr. Philis Pique on unit to review CT results & see patient Assessment and Plan  A: Post op complication  P: Care turned over to Dr. Rodrigo Ran 10/02/2017, 9:08 AM

## 2017-10-02 NOTE — Progress Notes (Signed)
Vitals:   10/02/17 0845 10/02/17 0900 10/02/17 1302  BP: (!) 158/104 (!) 157/101 (!) 160/96  Pulse: 92 92 68  Resp: 18  18  Temp: 98.2 F (36.8 C)  98.1 F (36.7 C)  TempSrc:   Oral   CT ADDENDUM: The patient returned for repeat images through the abdomen and pelvis to better visualized the ureters. There continues to be mild right hydronephrosis and hydroureter. The right ureter is prominent to near the right UVJ region. No evidence of focal ureteral injury or obstruction. No contrast extravasation. Urinary bladder and left ureter are unremarkable.  IMPRESSION: Continued mild hydronephrosis and hydroureter on the right. However, the right ureter is well opacified to the UVJ and demonstrates no visible evidence of injury or extravasation. This may be related to postoperative swelling in the region of the right UVJ.  Called Dr. Jeffie Pollock of [facility] Jeffie Pollock agrees that it may be edema causing some hydro on the R but a close to trigone injury is not entirely ruled out.  Will admit pt to obs for hydration, pain and nausea control and  bowel evacuation.  Chem panel including Cr is normal.  UA neg for infection. Renal US in am- if still hydronephrosis or significant pain, will consult formally for pt to possibly get stent.

## 2017-10-02 NOTE — Progress Notes (Signed)
36 y.o. yo complains of severe RLQ and R flank pain since Thursday, worsened today.  Pt has been uncomfortable on R side back and now across the back.  She says if feels like a tremendous amount of pressure.  She has had some nausea and vomiting.  She denies vaginal bleeding, leaking fluid from vagina.   She has constipation and not passing gase.  She is worse lying flat.   Past Medical History:  Diagnosis Date  . Abdominal lipoma 05/2015  . Anemia   . Arthritis    lower back  . Diabetes mellitus without complication ([place])   . GERD (gastroesophageal reflux disease)   . Headache    Migraines  . History of MRSA infection ~ 2006   leg  . Hypertension    under control with med., has been on med. x 6 mos.  . Mood swing   . Nerve pain    leg and feet bilateral  . Optic nerve swelling   . Psoriasis    Past Surgical History:  Procedure Laterality Date  . CESAREAN SECTION  06/21/2008; 08/18/2010  . CESAREAN SECTION N/A 07/10/2014   Procedure: REPEAT CESAREAN SECTION;  Surgeon: Daria Pastures, MD;  Location: [facility];  Service: Obstetrics;  Laterality: N/A;  . CHOLECYSTECTOMY  07/10/2008  . COLONOSCOPY    . CYSTOSCOPY N/A 09/28/2017   Procedure: CYSTOSCOPY;  Surgeon: Bobbye Charleston, MD;  Location: [facility];  Service: Gynecology;  Laterality: N/A;  . LIPOMA EXCISION  2014   back  . LIPOMA EXCISION N/A 05/21/2015   Procedure: EXCISION LIPOMAS ON ABDOMEN;  Surgeon: Erroll Luna, MD;  Location: [facility];  Service: General;  Laterality: N/A;  . LUMBAR PUNCTURE  09/19/17   [facility], Dr. Curt Bears  . OOPHORECTOMY Left 09/28/2017   Procedure: OOPHORECTOMY;  Surgeon: Bobbye Charleston, MD;  Location: [facility];  Service: Gynecology;  Laterality: Left;  . ROBOTIC ASSISTED LAPAROSCOPIC LYSIS OF ADHESION N/A 09/28/2017   Procedure: ROBOTIC ASSISTED LAPAROSCOPIC LYSIS OF ADHESION;  Surgeon: Bobbye Charleston, MD;  Location: [facility];  Service: Gynecology;  Laterality: N/A;   . ROBOTIC ASSISTED TOTAL HYSTERECTOMY WITH BILATERAL SALPINGO OOPHERECTOMY Bilateral 09/28/2017   Procedure: ROBOTIC ASSISTED TOTAL HYSTERECTOMY WITH BILATERAL SALPINGECTOMY;  Surgeon: Bobbye Charleston, MD;  Location: [facility];  Service: Gynecology;  Laterality: Bilateral;  . TUBAL LIGATION    . WISDOM TOOTH EXTRACTION      Social History   Socioeconomic History  . Marital status: Married    Spouse name: Not on file  . Number of children: Not on file  . Years of education: Not on file  . Highest education level: Not on file  Social Needs  . Financial resource strain: Not on file  . Food insecurity - worry: Not on file  . Food insecurity - inability: Not on file  . Transportation needs - medical: Not on file  . Transportation needs - non-medical: Not on file  Occupational History  . Not on file  Tobacco Use  . Smoking status: Current Some Day Smoker    Packs/day: 0.50    Years: 26.00    Pack years: 13.00    Types: Cigarettes    Start date: 05/01/2014  . Smokeless tobacco: Never Used  Substance and Sexual Activity  . Alcohol use: No    Alcohol/week: 0.0 oz  . Drug use: No  . Sexual activity: Yes    Birth control/protection: None  Other Topics Concern  . Not on file  Social  History Narrative   Live home with husband and 3 kids.   Education HS grad.  Culinary grad.  Caffeine  8 glasses (sweet tea), some soda's occ.  Working: [employer].      No current facility-administered medications on file prior to encounter.    Current Outpatient Medications on File Prior to Encounter  Medication Sig Dispense Refill  . Coal Tar Extract [phone] PSORIASIS MULTI-SYMPTOM EX) Apply 1 application as needed topically (for psoriasis).    . Diclofenac Sodium 3 % GEL Apply 2-3 g 4 (four) times daily topically.  0  . Doxepin HCl 5 % CREA Apply 2 g 3 (three) times daily topically.  0  . ferrous sulfate 325 (65 FE) MG tablet Take 1 tablet (325 mg total) by mouth daily. 30 tablet 2  . gabapentin  (NEURONTIN) 800 MG tablet Take 1 tablet (800 mg total) by mouth 3 (three) times daily. 90 tablet 6  . lidocaine (XYLOCAINE) 5 % ointment Apply 2-3 g 4 (four) times daily topically.  0  . losartan (COZAAR) 25 MG tablet Take 1 tablet (25 mg total) daily by mouth. 30 tablet 2  . metFORMIN (GLUCOPHAGE) 500 MG tablet Take 1 tablet (500 mg total) by mouth 2 (two) times daily with a meal. 180 tablet 3  . oxyCODONE-acetaminophen (PERCOCET/ROXICET) 5-325 MG tablet Take 1-2 tablets by mouth every 4 (four) hours as needed for severe pain (moderate to severe pain (when tolerating fluids)). 30 tablet 0  . rosuvastatin (CRESTOR) 20 MG tablet Take 1 tablet (20 mg total) by mouth daily. 90 tablet 2  . sertraline (ZOLOFT) 100 MG tablet Take 100 mg daily by mouth.    . vitamin B-12 (CYANOCOBALAMIN) 1000 MCG tablet Take 1,000 mcg daily by mouth.      Allergies  Allergen Reactions  . Bee Venom Anaphylaxis  . Penicillins Anaphylaxis and Other (See Comments)    Has patient had a PCN reaction causing immediate rash, facial/tongue/throat swelling, SOB or lightheadedness with hypotension: Yes Has patient had a PCN reaction causing severe rash involving mucus membranes or skin necrosis: No Has patient had a PCN reaction that required hospitalization: Yes Has patient had a PCN reaction occurring within the last 10 years: No If all of the above answers are "NO", then may proceed with Cephalosporin use.   . Mushroom Extract Complex Nausea And Vomiting  . Powder Itching, Rash and Other (See Comments)    The powder from latex gloves    Vitals:   10/02/17 0845 10/02/17 0900  BP: (!) 158/104 (!) 157/101  Pulse: 92 92  Resp: 18   Temp: 98.2 F (36.8 C)     Lungs: clear to ascultation Cor:  RRR Abdomen:  soft, nontender, nondistended. Ex:  no cords, erythema Pelvic:  NEFG, cuff intact without bleeding or fluid.  Very tender on exam on R.  Results for orders placed or performed during the hospital encounter of  10/02/17 (from the past 24 hour(s))  Urinalysis, Routine w reflex microscopic     Status: Abnormal   Collection Time: 10/02/17  8:47 AM  Result Value Ref Range   Color, Urine YELLOW YELLOW   APPearance CLEAR CLEAR   Specific Gravity, Urine 1.009 1.005 - 1.030   pH 6.0 5.0 - 8.0   Glucose, UA 50 (A) NEGATIVE mg/dL   Hgb urine dipstick SMALL (A) NEGATIVE   Bilirubin Urine NEGATIVE NEGATIVE   Ketones, ur NEGATIVE NEGATIVE mg/dL   Protein, ur NEGATIVE NEGATIVE mg/dL   Nitrite NEGATIVE NEGATIVE  Leukocytes, UA NEGATIVE NEGATIVE   RBC / HPF 0-5 0 - 5 RBC/hpf   WBC, UA 0-5 0 - 5 WBC/hpf   Bacteria, UA NONE SEEN NONE SEEN   Squamous Epithelial / LPF 0-5 (A) NONE SEEN  Comprehensive metabolic panel     Status: Abnormal   Collection Time: 10/02/17  9:31 AM  Result Value Ref Range   Sodium 135 135 - 145 mmol/L   Potassium 3.5 3.5 - 5.1 mmol/L   Chloride 100 (L) 101 - 111 mmol/L   CO2 26 22 - 32 mmol/L   Glucose, Bld 142 (H) 65 - 99 mg/dL   BUN 9 6 - 20 mg/dL   Creatinine, Ser 0.64 0.44 - 1.00 mg/dL   Calcium 9.3 8.9 - 10.3 mg/dL   Total Protein 7.5 6.5 - 8.1 g/dL   Albumin 3.6 3.5 - 5.0 g/dL   AST 16 15 - 41 U/L   ALT 15 14 - 54 U/L   Alkaline Phosphatase 67 38 - 126 U/L   Total Bilirubin 0.7 0.3 - 1.2 mg/dL   GFR calc non Af Amer >60 >60 mL/min   GFR calc Af Amer >60 >60 mL/min   Anion gap 9 5 - 15  CBC with Differential/Platelet     Status: Abnormal   Collection Time: 10/02/17  9:31 AM  Result Value Ref Range   WBC 12.9 (H) 4.0 - 10.5 K/uL   RBC 4.80 3.87 - 5.11 MIL/uL   Hemoglobin 12.2 12.0 - 15.0 g/dL   HCT 37.7 36.0 - 46.0 %   MCV 78.5 78.0 - 100.0 fL   MCH 25.4 (L) 26.0 - 34.0 pg   MCHC 32.4 30.0 - 36.0 g/dL   RDW 18.0 (H) 11.5 - 15.5 %   Platelets 307 150 - 400 K/uL   Neutrophils Relative % 70 %   Neutro Abs 8.9 (H) 1.7 - 7.7 K/uL   Lymphocytes Relative 17 %   Lymphs Abs 2.2 0.7 - 4.0 K/uL   Monocytes Relative 4 %   Monocytes Absolute 0.5 0.1 - 1.0 K/uL    Eosinophils Relative 9 %   Eosinophils Absolute 1.2 (H) 0.0 - 0.7 K/uL   Basophils Relative 0 %   Basophils Absolute 0.0 0.0 - 0.1 K/uL    A:  PO #4 Uncomplicated robotic assisted TLH, LSO and salpingectomy.  Cysto done at time of surgery was positive for spill.  Now RLQ pain, R flank pain, N/V, and CT showing : Postoperative changes from recent hysterectomy. Stranding in the pelvis is likely related to recent postoperative state.  Locules of peritoneal or extraperitoneal gas in the left pelvis and left groin. Gas within the right anterior abdominal wall and abdominal wall musculature.  Mild right hydronephrosis and slight delayed excretion of contrast from the right kidney. The right ureter is dilated into the pelvis, but not visualized well beyond the right ovary. No visible obstructing stones.   P: Suspect R ureteral injury from [place].   Radiology rec repeat CT delayed.  If still apparent, will have Urology consult. D/w pt.  Pain control and NPO in meantime.  Lincoln Kleiner A

## 2017-10-02 NOTE — MAU Note (Signed)
Patient had LAVH, constipation, vomits when thinks she needs to have bowel movement.

## 2017-10-03 ENCOUNTER — Encounter (HOSPITAL_COMMUNITY)
Admission: AD | Disposition: A | Discharge status: Home / Self Care | Payer: Self-pay | Source: Ambulatory Visit | Attending: Obstetrics and Gynecology

## 2017-10-03 ENCOUNTER — Encounter (HOSPITAL_COMMUNITY): Payer: Self-pay | Admitting: Obstetrics and Gynecology

## 2017-10-03 ENCOUNTER — Other Ambulatory Visit: Payer: Self-pay | Admitting: Urology

## 2017-10-03 ENCOUNTER — Inpatient Hospital Stay (HOSPITAL_COMMUNITY): Payer: Medicaid Other

## 2017-10-03 ENCOUNTER — Inpatient Hospital Stay (HOSPITAL_COMMUNITY): Payer: Medicaid Other | Admitting: Certified Registered Nurse Anesthetist

## 2017-10-03 DIAGNOSIS — N1330 Unspecified hydronephrosis: Secondary | ICD-10-CM | POA: Diagnosis not present

## 2017-10-03 DIAGNOSIS — R1031 Right lower quadrant pain: Secondary | ICD-10-CM

## 2017-10-03 HISTORY — DX: Right lower quadrant pain: R10.31

## 2017-10-03 HISTORY — PX: CYSTOSCOPY W/ URETERAL STENT PLACEMENT: SHX1429

## 2017-10-03 LAB — GLUCOSE, CAPILLARY: Glucose-Capillary: 123 mg/dL — ABNORMAL HIGH (ref 65–99)

## 2017-10-03 SURGERY — CYSTOSCOPY, WITH RETROGRADE PYELOGRAM AND URETERAL STENT INSERTION
Anesthesia: General | Site: Urethra | Laterality: Right

## 2017-10-03 MED ORDER — STERILE WATER FOR IRRIGATION IR SOLN
Status: DC | PRN
  Administered 2017-10-03: 1000 mL

## 2017-10-03 MED ORDER — IOPAMIDOL (ISOVUE-300) INJECTION 61%
30.0000 mL | Freq: Once | INTRAVENOUS | Status: AC | PRN
  Administered 2017-10-03: 19 mL

## 2017-10-03 MED ORDER — FLEET ENEMA 7-19 GM/118ML RE ENEM: 1.0000 | ENEMA | Freq: Once | RECTAL | Status: DC

## 2017-10-03 MED ORDER — ONDANSETRON HCL 4 MG/2ML IJ SOLN
INTRAMUSCULAR | Status: AC
  Filled 2017-10-03: qty 2

## 2017-10-03 MED ORDER — TRAMADOL HCL 50 MG PO TABS: 50.0000 mg | ORAL_TABLET | Freq: Four times a day (QID) | ORAL | 0 refills | Status: DC | PRN

## 2017-10-03 MED ORDER — FENTANYL CITRATE (PF) 100 MCG/2ML IJ SOLN
INTRAMUSCULAR | Status: AC
  Filled 2017-10-03: qty 2

## 2017-10-03 MED ORDER — OXYCODONE HCL 5 MG/5ML PO SOLN: 5.0000 mg | Freq: Once | ORAL | Status: DC | PRN

## 2017-10-03 MED ORDER — OXYCODONE HCL 5 MG PO TABS: 5.0000 mg | ORAL_TABLET | Freq: Once | ORAL | Status: DC | PRN

## 2017-10-03 MED ORDER — PROPOFOL 10 MG/ML IV BOLUS
INTRAVENOUS | Status: AC
  Filled 2017-10-03: qty 20

## 2017-10-03 MED ORDER — LIDOCAINE HCL (CARDIAC) 20 MG/ML IV SOLN
INTRAVENOUS | Status: AC
  Filled 2017-10-03: qty 5

## 2017-10-03 MED ORDER — MIDAZOLAM HCL 2 MG/2ML IJ SOLN
INTRAMUSCULAR | Status: DC | PRN
  Administered 2017-10-03: 2 mg via INTRAVENOUS

## 2017-10-03 MED ORDER — LIDOCAINE HCL (CARDIAC) 20 MG/ML IV SOLN
INTRAVENOUS | Status: DC | PRN
  Administered 2017-10-03: 60 mg via INTRAVENOUS

## 2017-10-03 MED ORDER — MIDAZOLAM HCL 2 MG/2ML IJ SOLN
INTRAMUSCULAR | Status: AC
  Filled 2017-10-03: qty 2

## 2017-10-03 MED ORDER — CIPROFLOXACIN IN D5W 400 MG/200ML IV SOLN
400.0000 mg | Freq: Once | INTRAVENOUS | Status: AC
  Administered 2017-10-03: 400 mg via INTRAVENOUS
  Filled 2017-10-03: qty 200

## 2017-10-03 MED ORDER — ONDANSETRON HCL 4 MG/2ML IJ SOLN: 4.0000 mg | Freq: Four times a day (QID) | INTRAMUSCULAR | Status: DC | PRN

## 2017-10-03 MED ORDER — FENTANYL CITRATE (PF) 100 MCG/2ML IJ SOLN
INTRAMUSCULAR | Status: DC | PRN
  Administered 2017-10-03: 50 ug via INTRAVENOUS

## 2017-10-03 MED ORDER — LACTATED RINGERS IV SOLN
INTRAVENOUS | Status: DC | PRN
  Administered 2017-10-03: 15:00:00 via INTRAVENOUS

## 2017-10-03 MED ORDER — DEXAMETHASONE SODIUM PHOSPHATE 10 MG/ML IJ SOLN
INTRAMUSCULAR | Status: DC | PRN
  Administered 2017-10-03: 4 mg via INTRAVENOUS

## 2017-10-03 MED ORDER — PROPOFOL 10 MG/ML IV BOLUS
INTRAVENOUS | Status: DC | PRN
  Administered 2017-10-03: 200 mg via INTRAVENOUS

## 2017-10-03 MED ORDER — FENTANYL CITRATE (PF) 100 MCG/2ML IJ SOLN: 25.0000 ug | INTRAMUSCULAR | Status: DC | PRN

## 2017-10-03 MED ORDER — ONDANSETRON HCL 4 MG/2ML IJ SOLN
INTRAMUSCULAR | Status: DC | PRN
  Administered 2017-10-03: 4 mg via INTRAVENOUS

## 2017-10-03 MED ORDER — DEXAMETHASONE SODIUM PHOSPHATE 4 MG/ML IJ SOLN
INTRAMUSCULAR | Status: AC
  Filled 2017-10-03: qty 1

## 2017-10-03 SURGICAL SUPPLY — 17 items
CATH INTERMIT  6FR 70CM (CATHETERS) ×2 IMPLANT
DRAPE C-ARM 42X72 X-RAY (DRAPES) ×2 IMPLANT
GLOVE BIOGEL PI IND STRL 7.0 (GLOVE) ×1 IMPLANT
GLOVE BIOGEL PI IND STRL 7.5 (GLOVE) ×1 IMPLANT
GLOVE BIOGEL PI INDICATOR 7.0 (GLOVE) ×1
GLOVE BIOGEL PI INDICATOR 7.5 (GLOVE) ×1
GLOVE INDICATOR 6.5 STRL GRN (GLOVE) ×4 IMPLANT
GLOVE SKINSENSE NS SZ6.5 (GLOVE) ×1
GLOVE SKINSENSE STRL SZ6.5 (GLOVE) ×1 IMPLANT
GOWN SRG LRG LVL 3 NONREINFORC (GOWNS) ×2 IMPLANT
GOWN STRL NON-REIN LRG LVL3 (GOWNS) ×2
GUIDEWIRE STR DUAL SENSOR (WIRE) ×2 IMPLANT
PACK VAGINAL MINOR WOMEN LF (CUSTOM PROCEDURE TRAY) ×2 IMPLANT
SET CYSTO W/LG BORE CLAMP LF (SET/KITS/TRAYS/PACK) ×2 IMPLANT
SLEEVE SCD COMPRESS KNEE MED (MISCELLANEOUS) ×2 IMPLANT
STENT URET 6FRX26 CONTOUR (STENTS) ×2 IMPLANT
TOWEL OR 17X26 10 PK STRL BLUE (TOWEL DISPOSABLE) ×4 IMPLANT

## 2017-10-03 NOTE — Progress Notes (Signed)
Patient is eating, ambulating, is voiding copious amounts of urine through foley.  Pain control is good- pt just needed one dose of stadol this am since last night.  BP (!) 160/95 (BP Location: Left Arm)   Pulse 63   Temp 98 F (36.7 C)   Resp 18   Ht 5\' 6"  (1.676 m)   Wt 194 lb (88 kg)   LMP 09/11/2017 (Approximate)   SpO2 98%   BMI 31.31 kg/m   lungs:  clear to auscultation cor:    RRR Abdomen:  soft, appropriate tenderness, incisions intact and without erythema or exudate. ex:    no cords   Lab Results  Component Value Date   WBC 12.9 (H) 10/02/2017   HGB 12.2 10/02/2017   HCT 37.7 10/02/2017   MCV 78.5 10/02/2017   PLT 307 10/02/2017    A/P  PO #5  Uncomplicated Robo TLH/LSO.   First CT done had delayed flow of right with mild hydronephrosis.  Second CT confirmed but opacification seen all the way to bladder without evidence of injury.  Possible edema at trigone.  Foley placed yesteday with >500 cc out at time.   Enema has not helped release of constipation yet.  Pain is still there. [place] of kidney still shows mild R hydronephrosis.    Will have Uro consult in case needs stent.  It is still possible that the hydro is from edema from trigone and/or from bladder not emptying well before foley placed.   Redo enema again.

## 2017-10-03 NOTE — H&P (View-Only) (Signed)
Urology Consult   Physician requesting consult: Dr. Bobbye Charleston  Reason for consult: Right hydronephrhosis  History of Present Illness: [name] is a 36 y.o. female status post bilateral salpingectomy, left oophorectomy  and total hysterectomy on 09/28/2017. (Cystoscopy performed at that time showed peristalsis of both ureters.) The patient presented over the weekend with worsening right-sided flank pain associated with nausea/vomiting.  She had a subsequent CT scan that showed mild hydronephrosis on the right side with no obvious transition point along the course of the right ureter as well as a renal ultrasound today confirming persistent hydronephrosis.  Her renal function has been normal throughout this episode.  She has no prior history of nephrolithiasis or urologic surgery.  Currently, her flank pain is controlled, but is now centered more in the mid back.  She denies dysuria, hematuria or persistent nausea/vomiting.    Past Medical History:  Diagnosis Date  . Abdominal lipoma 05/2015  . Abdominal pain, RLQ 10/03/2017  . Anemia   . Arthritis    lower back  . Diabetes mellitus without complication ([place])   . GERD (gastroesophageal reflux disease)   . Headache    Migraines  . History of MRSA infection ~ 2006   leg  . Hypertension    under control with med., has been on med. x 6 mos.  . Mood swing   . Nerve pain    leg and feet bilateral  . Optic nerve swelling   . Psoriasis     Past Surgical History:  Procedure Laterality Date  . CESAREAN SECTION  06/21/2008; 08/18/2010  . CESAREAN SECTION N/A 07/10/2014   Procedure: REPEAT CESAREAN SECTION;  Surgeon: Daria Pastures, MD;  Location: [facility];  Service: Obstetrics;  Laterality: N/A;  . CHOLECYSTECTOMY  07/10/2008  . COLONOSCOPY    . CYSTOSCOPY N/A 09/28/2017   Procedure: CYSTOSCOPY;  Surgeon: Bobbye Charleston, MD;  Location: [facility];  Service: Gynecology;  Laterality: N/A;  . LIPOMA EXCISION  2014   back  .  LIPOMA EXCISION N/A 05/21/2015   Procedure: EXCISION LIPOMAS ON ABDOMEN;  Surgeon: Erroll Luna, MD;  Location: [facility];  Service: General;  Laterality: N/A;  . LUMBAR PUNCTURE  09/19/17   [facility], Dr. Curt Bears  . OOPHORECTOMY Left 09/28/2017   Procedure: OOPHORECTOMY;  Surgeon: Bobbye Charleston, MD;  Location: [facility];  Service: Gynecology;  Laterality: Left;  . ROBOTIC ASSISTED LAPAROSCOPIC LYSIS OF ADHESION N/A 09/28/2017   Procedure: ROBOTIC ASSISTED LAPAROSCOPIC LYSIS OF ADHESION;  Surgeon: Bobbye Charleston, MD;  Location: [facility];  Service: Gynecology;  Laterality: N/A;  . ROBOTIC ASSISTED TOTAL HYSTERECTOMY WITH BILATERAL SALPINGO OOPHERECTOMY Bilateral 09/28/2017   Procedure: ROBOTIC ASSISTED TOTAL HYSTERECTOMY WITH BILATERAL SALPINGECTOMY;  Surgeon: Bobbye Charleston, MD;  Location: [facility];  Service: Gynecology;  Laterality: Bilateral;  . TUBAL LIGATION    . WISDOM TOOTH EXTRACTION      Current Hospital Medications:  Home Meds:  Current Meds  Medication Sig  . Coal Tar Extract [phone] PSORIASIS MULTI-SYMPTOM EX) Apply 1 application as needed topically (for psoriasis).  . Diclofenac Sodium 3 % GEL Apply 2-3 g 4 (four) times daily topically.  . Doxepin HCl 5 % CREA Apply 2 g 3 (three) times daily topically.  . ferrous sulfate 325 (65 FE) MG tablet Take 1 tablet (325 mg total) by mouth daily.  Marland Kitchen gabapentin (NEURONTIN) 800 MG tablet Take 1 tablet (800 mg total) by mouth 3 (three) times daily.  Marland Kitchen lidocaine (XYLOCAINE) 5 %  ointment Apply 2-3 g 4 (four) times daily topically.  Marland Kitchen losartan (COZAAR) 25 MG tablet Take 1 tablet (25 mg total) daily by mouth.  . metFORMIN (GLUCOPHAGE) 500 MG tablet Take 1 tablet (500 mg total) by mouth 2 (two) times daily with a meal.  . oxyCODONE-acetaminophen (PERCOCET/ROXICET) 5-325 MG tablet Take 1-2 tablets by mouth every 4 (four) hours as needed for severe pain (moderate to severe pain (when tolerating fluids)).  Marland Kitchen  rosuvastatin (CRESTOR) 20 MG tablet Take 1 tablet (20 mg total) by mouth daily.  . sertraline (ZOLOFT) 100 MG tablet Take 100 mg daily by mouth.  . vitamin B-12 (CYANOCOBALAMIN) 1000 MCG tablet Take 1,000 mcg daily by mouth.    Scheduled Meds: . docusate sodium  100 mg Oral BID  . losartan  25 mg Oral Daily  . sodium phosphate  1 enema Rectal Once   Continuous Infusions: . lactated ringers 100 mL/hr at 10/03/17 0628   PRN Meds:.butorphanol, oxyCODONE-acetaminophen, promethazine  Allergies:  Allergies  Allergen Reactions  . Bee Venom Anaphylaxis  . Penicillins Anaphylaxis and Other (See Comments)    Has patient had a PCN reaction causing immediate rash, facial/tongue/throat swelling, SOB or lightheadedness with hypotension: Yes Has patient had a PCN reaction causing severe rash involving mucus membranes or skin necrosis: No Has patient had a PCN reaction that required hospitalization: Yes Has patient had a PCN reaction occurring within the last 10 years: No If all of the above answers are "NO", then may proceed with Cephalosporin use.   . Mushroom Extract Complex Nausea And Vomiting  . Powder Itching, Rash and Other (See Comments)    The powder from latex gloves    Family History  Problem Relation Age of Onset  . Drug abuse Mother   . Heart disease Maternal Grandmother   . Heart disease Maternal Grandfather     Social History:  reports that she has been smoking cigarettes.  She started smoking about 3 years ago. She has a 13.00 pack-year smoking history. she has never used smokeless tobacco. She reports that she does not drink alcohol or use drugs.  ROS: A complete review of systems was performed.  All systems are negative except for pertinent findings as noted.  Physical Exam:  Vital signs in last 24 hours: Temp:  [98 F (36.7 C)-99.4 F (37.4 C)] 98.1 F (36.7 C) (12/03 1158) Pulse Rate:  [60-91] 63 (12/03 1158) Resp:  [17-18] 18 (12/03 1158) BP: (144-173)/(74-96)  163/93 (12/03 1158) SpO2:  [93 %-98 %] 98 % (12/03 1158) Weight:  [88 kg (194 lb)] 88 kg (194 lb) (12/02 1803) Constitutional:  Alert and oriented, No acute distress Cardiovascular: Regular rate and rhythm, No JVD Respiratory: Normal respiratory effort, Lungs clear bilaterally GI: Abdomen is soft, nontender, nondistended, no abdominal masses GU: Foley catheter in place and draining clear-yellow urine Lymphatic: No lymphadenopathy Neurologic: Grossly intact, no focal deficits Psychiatric: Normal mood and affect  Laboratory Data:  Recent Labs    10/02/17 0931  WBC 12.9*  HGB 12.2  HCT 37.7  PLT 307    Recent Labs    10/02/17 0931  NA 135  K 3.5  CL 100*  GLUCOSE 142*  BUN 9  CALCIUM 9.3  CREATININE 0.64     No results found for this or any previous visit (from the past 24 hour(s)). No results found for this or any previous visit (from the past 240 hour(s)).  Renal Function: Recent Labs    10/02/17 0931  CREATININE 0.64  Estimated Creatinine Clearance: 106.6 mL/min (by C-G formula based on SCr of 0.64 mg/dL).  Radiologic Imaging: Ct Abdomen Pelvis W Contrast  Addendum Date: 10/02/2017   ADDENDUM REPORT: 10/02/2017 13:42 ADDENDUM: The patient returned for repeat images through the abdomen and pelvis to better visualized the ureters. There continues to be mild right hydronephrosis and hydroureter. The right ureter is prominent to near the right UVJ region. No evidence of focal ureteral injury or obstruction. No contrast extravasation. Urinary bladder and left ureter are unremarkable. IMPRESSION: Continued mild hydronephrosis and hydroureter on the right. However, the right ureter is well opacified to the UVJ and demonstrates no visible evidence of injury or extravasation. This may be related to postoperative swelling in the region of the right UVJ. Electronically Signed   By: Rolm Baptise M.D.   On: 10/02/2017 13:42   Result Date: 10/02/2017 CLINICAL DATA:  Status post  hysterectomy. Abdominal pain, distention EXAM: CT ABDOMEN AND PELVIS WITH CONTRAST TECHNIQUE: Multidetector CT imaging of the abdomen and pelvis was performed using the standard protocol following bolus administration of intravenous contrast. CONTRAST:  166mL ISOVUE-300 IOPAMIDOL (ISOVUE-300) INJECTION 61%, <See Chart> ISOVUE-300 IOPAMIDOL (ISOVUE-300) INJECTION 61% COMPARISON:  None. FINDINGS: Lower chest: Bibasilar atelectasis or infiltrates, right greater than left. Heart is normal size. No effusions. Hepatobiliary: Prior cholecystectomy. Suspect mild fatty infiltration of the liver. No focal hepatic abnormality. Pancreas: No focal abnormality or ductal dilatation. Spleen: No focal abnormality.  Normal size. Adrenals/Urinary Tract: There is mild right hydronephrosis and hydroureter with slight delayed excretion of contrast from the right kidney. The right ureter is dilated into the pelvis where becomes difficult to follow adjacent to the right ovary. No stones visualized. Adrenal glands, left kidney and urinary bladder unremarkable. Stomach/Bowel: Stomach, large and small bowel grossly unremarkable. Moderate stool in the right colon. Appendix visualized, normal. Vascular/Lymphatic: No evidence of aneurysm or adenopathy. Reproductive: Prior hysterectomy. Small collapsing follicle or functional cyst in the right ovary. No adnexal masses. Other: There is stranding in the lower pelvis, likely related to recent hysterectomy. Locules of peritoneal or extraperitoneal gas noted in the left pelvis and extending into the left groin. Gas also noted within the right anterior abdominal wall. These findings are presumably related to recent surgery. Musculoskeletal: No acute bony abnormality. IMPRESSION: Postoperative changes from recent hysterectomy. Stranding in the pelvis is likely related to recent postoperative state. Locules of peritoneal or extraperitoneal gas in the left pelvis and left groin. Gas within the right  anterior abdominal wall and abdominal wall musculature. Mild right hydronephrosis and slight delayed excretion of contrast from the right kidney. The right ureter is dilated into the pelvis, but not visualized well beyond the right ovary. No visible obstructing stones. Suspect mild fatty infiltration of the liver. Bibasilar airspace opacities, right greater than left, atelectasis versus infiltrates. Electronically Signed: By: Rolm Baptise M.D. On: 10/02/2017 11:40   US Renal  Result Date: 10/03/2017 CLINICAL DATA:  Right hydronephrosis EXAM: RENAL / URINARY TRACT ULTRASOUND COMPLETE COMPARISON:  CT 10/02/2017 FINDINGS: Right Kidney: Length: 15.0. Mild hydronephrosis unchanged from prior CT. Negative for stone or calculus. Left Kidney: Length: 13.5 cm. Echogenicity within normal limits. No mass or hydronephrosis visualized. Bladder: Empty bladder with Foley catheter IMPRESSION: Mild right hydronephrosis, unchanged from CT scan yesterday. Cause of obstruction not identified. Normal left kidney. Electronically Signed   By: Franchot Gallo M.D.   On: 10/03/2017 08:50    I independently reviewed the above imaging studies.  Impression/Recommendation Right hydronephrosis Right flank pain POD #  5 s/p robotic-assisted bilateral salpingectomy, left oopherectomy and hysterectomy with lysis of adhesion and cystoscopy  -The risks, benefits and alternatives of cystoscopy with right retrograde pyelogram and right JJ stent placement was discussed with the patient.  Risks include bleeding, urinary tract infection, chronic pain secondary to her stent, the inability to access the upper urinary tract in a retrograde fashion requiring percutaneous nephrostomy tube placement and the inherent risks with general anesthesia.  She voices understanding and wishes to proceed  Ellison Hughs, [facility] 10/03/2017, 12:55 PM

## 2017-10-03 NOTE — Consult Note (Signed)
Urology Consult   Physician requesting consult: Dr. Bobbye Charleston  Reason for consult: Right hydronephrhosis  History of Present Illness: [name] is a 36 y.o. female status post bilateral salpingectomy, left oophorectomy  and total hysterectomy on 09/28/2017. (Cystoscopy performed at that time showed peristalsis of both ureters.) The patient presented over the weekend with worsening right-sided flank pain associated with nausea/vomiting.  She had a subsequent CT scan that showed mild hydronephrosis on the right side with no obvious transition point along the course of the right ureter as well as a renal ultrasound today confirming persistent hydronephrosis.  Her renal function has been normal throughout this episode.  She has no prior history of nephrolithiasis or urologic surgery.  Currently, her flank pain is controlled, but is now centered more in the mid back.  She denies dysuria, hematuria or persistent nausea/vomiting.    Past Medical History:  Diagnosis Date  . Abdominal lipoma 05/2015  . Abdominal pain, RLQ 10/03/2017  . Anemia   . Arthritis    lower back  . Diabetes mellitus without complication ([place])   . GERD (gastroesophageal reflux disease)   . Headache    Migraines  . History of MRSA infection ~ 2006   leg  . Hypertension    under control with med., has been on med. x 6 mos.  . Mood swing   . Nerve pain    leg and feet bilateral  . Optic nerve swelling   . Psoriasis     Past Surgical History:  Procedure Laterality Date  . CESAREAN SECTION  06/21/2008; 08/18/2010  . CESAREAN SECTION N/A 07/10/2014   Procedure: REPEAT CESAREAN SECTION;  Surgeon: Daria Pastures, MD;  Location: [facility];  Service: Obstetrics;  Laterality: N/A;  . CHOLECYSTECTOMY  07/10/2008  . COLONOSCOPY    . CYSTOSCOPY N/A 09/28/2017   Procedure: CYSTOSCOPY;  Surgeon: Bobbye Charleston, MD;  Location: [facility];  Service: Gynecology;  Laterality: N/A;  . LIPOMA EXCISION  2014   back  .  LIPOMA EXCISION N/A 05/21/2015   Procedure: EXCISION LIPOMAS ON ABDOMEN;  Surgeon: Erroll Luna, MD;  Location: [facility];  Service: General;  Laterality: N/A;  . LUMBAR PUNCTURE  09/19/17   [facility], Dr. Curt Bears  . OOPHORECTOMY Left 09/28/2017   Procedure: OOPHORECTOMY;  Surgeon: Bobbye Charleston, MD;  Location: [facility];  Service: Gynecology;  Laterality: Left;  . ROBOTIC ASSISTED LAPAROSCOPIC LYSIS OF ADHESION N/A 09/28/2017   Procedure: ROBOTIC ASSISTED LAPAROSCOPIC LYSIS OF ADHESION;  Surgeon: Bobbye Charleston, MD;  Location: [facility];  Service: Gynecology;  Laterality: N/A;  . ROBOTIC ASSISTED TOTAL HYSTERECTOMY WITH BILATERAL SALPINGO OOPHERECTOMY Bilateral 09/28/2017   Procedure: ROBOTIC ASSISTED TOTAL HYSTERECTOMY WITH BILATERAL SALPINGECTOMY;  Surgeon: Bobbye Charleston, MD;  Location: [facility];  Service: Gynecology;  Laterality: Bilateral;  . TUBAL LIGATION    . WISDOM TOOTH EXTRACTION      Current Hospital Medications:  Home Meds:  Current Meds  Medication Sig  . Coal Tar Extract [phone] PSORIASIS MULTI-SYMPTOM EX) Apply 1 application as needed topically (for psoriasis).  . Diclofenac Sodium 3 % GEL Apply 2-3 g 4 (four) times daily topically.  . Doxepin HCl 5 % CREA Apply 2 g 3 (three) times daily topically.  . ferrous sulfate 325 (65 FE) MG tablet Take 1 tablet (325 mg total) by mouth daily.  Marland Kitchen gabapentin (NEURONTIN) 800 MG tablet Take 1 tablet (800 mg total) by mouth 3 (three) times daily.  Marland Kitchen lidocaine (XYLOCAINE) 5 %  ointment Apply 2-3 g 4 (four) times daily topically.  Marland Kitchen losartan (COZAAR) 25 MG tablet Take 1 tablet (25 mg total) daily by mouth.  . metFORMIN (GLUCOPHAGE) 500 MG tablet Take 1 tablet (500 mg total) by mouth 2 (two) times daily with a meal.  . oxyCODONE-acetaminophen (PERCOCET/ROXICET) 5-325 MG tablet Take 1-2 tablets by mouth every 4 (four) hours as needed for severe pain (moderate to severe pain (when tolerating fluids)).  Marland Kitchen  rosuvastatin (CRESTOR) 20 MG tablet Take 1 tablet (20 mg total) by mouth daily.  . sertraline (ZOLOFT) 100 MG tablet Take 100 mg daily by mouth.  . vitamin B-12 (CYANOCOBALAMIN) 1000 MCG tablet Take 1,000 mcg daily by mouth.    Scheduled Meds: . docusate sodium  100 mg Oral BID  . losartan  25 mg Oral Daily  . sodium phosphate  1 enema Rectal Once   Continuous Infusions: . lactated ringers 100 mL/hr at 10/03/17 0628   PRN Meds:.butorphanol, oxyCODONE-acetaminophen, promethazine  Allergies:  Allergies  Allergen Reactions  . Bee Venom Anaphylaxis  . Penicillins Anaphylaxis and Other (See Comments)    Has patient had a PCN reaction causing immediate rash, facial/tongue/throat swelling, SOB or lightheadedness with hypotension: Yes Has patient had a PCN reaction causing severe rash involving mucus membranes or skin necrosis: No Has patient had a PCN reaction that required hospitalization: Yes Has patient had a PCN reaction occurring within the last 10 years: No If all of the above answers are "NO", then may proceed with Cephalosporin use.   . Mushroom Extract Complex Nausea And Vomiting  . Powder Itching, Rash and Other (See Comments)    The powder from latex gloves    Family History  Problem Relation Age of Onset  . Drug abuse Mother   . Heart disease Maternal Grandmother   . Heart disease Maternal Grandfather     Social History:  reports that she has been smoking cigarettes.  She started smoking about 3 years ago. She has a 13.00 pack-year smoking history. she has never used smokeless tobacco. She reports that she does not drink alcohol or use drugs.  ROS: A complete review of systems was performed.  All systems are negative except for pertinent findings as noted.  Physical Exam:  Vital signs in last 24 hours: Temp:  [98 F (36.7 C)-99.4 F (37.4 C)] 98.1 F (36.7 C) (12/03 1158) Pulse Rate:  [60-91] 63 (12/03 1158) Resp:  [17-18] 18 (12/03 1158) BP: (144-173)/(74-96)  163/93 (12/03 1158) SpO2:  [93 %-98 %] 98 % (12/03 1158) Weight:  [88 kg (194 lb)] 88 kg (194 lb) (12/02 1803) Constitutional:  Alert and oriented, No acute distress Cardiovascular: Regular rate and rhythm, No JVD Respiratory: Normal respiratory effort, Lungs clear bilaterally GI: Abdomen is soft, nontender, nondistended, no abdominal masses GU: Foley catheter in place and draining clear-yellow urine Lymphatic: No lymphadenopathy Neurologic: Grossly intact, no focal deficits Psychiatric: Normal mood and affect  Laboratory Data:  Recent Labs    10/02/17 0931  WBC 12.9*  HGB 12.2  HCT 37.7  PLT 307    Recent Labs    10/02/17 0931  NA 135  K 3.5  CL 100*  GLUCOSE 142*  BUN 9  CALCIUM 9.3  CREATININE 0.64     No results found for this or any previous visit (from the past 24 hour(s)). No results found for this or any previous visit (from the past 240 hour(s)).  Renal Function: Recent Labs    10/02/17 0931  CREATININE 0.64  Estimated Creatinine Clearance: 106.6 mL/min (by C-G formula based on SCr of 0.64 mg/dL).  Radiologic Imaging: Ct Abdomen Pelvis W Contrast  Addendum Date: 10/02/2017   ADDENDUM REPORT: 10/02/2017 13:42 ADDENDUM: The patient returned for repeat images through the abdomen and pelvis to better visualized the ureters. There continues to be mild right hydronephrosis and hydroureter. The right ureter is prominent to near the right UVJ region. No evidence of focal ureteral injury or obstruction. No contrast extravasation. Urinary bladder and left ureter are unremarkable. IMPRESSION: Continued mild hydronephrosis and hydroureter on the right. However, the right ureter is well opacified to the UVJ and demonstrates no visible evidence of injury or extravasation. This may be related to postoperative swelling in the region of the right UVJ. Electronically Signed   By: Rolm Baptise M.D.   On: 10/02/2017 13:42   Result Date: 10/02/2017 CLINICAL DATA:  Status post  hysterectomy. Abdominal pain, distention EXAM: CT ABDOMEN AND PELVIS WITH CONTRAST TECHNIQUE: Multidetector CT imaging of the abdomen and pelvis was performed using the standard protocol following bolus administration of intravenous contrast. CONTRAST:  126mL ISOVUE-300 IOPAMIDOL (ISOVUE-300) INJECTION 61%, <See Chart> ISOVUE-300 IOPAMIDOL (ISOVUE-300) INJECTION 61% COMPARISON:  None. FINDINGS: Lower chest: Bibasilar atelectasis or infiltrates, right greater than left. Heart is normal size. No effusions. Hepatobiliary: Prior cholecystectomy. Suspect mild fatty infiltration of the liver. No focal hepatic abnormality. Pancreas: No focal abnormality or ductal dilatation. Spleen: No focal abnormality.  Normal size. Adrenals/Urinary Tract: There is mild right hydronephrosis and hydroureter with slight delayed excretion of contrast from the right kidney. The right ureter is dilated into the pelvis where becomes difficult to follow adjacent to the right ovary. No stones visualized. Adrenal glands, left kidney and urinary bladder unremarkable. Stomach/Bowel: Stomach, large and small bowel grossly unremarkable. Moderate stool in the right colon. Appendix visualized, normal. Vascular/Lymphatic: No evidence of aneurysm or adenopathy. Reproductive: Prior hysterectomy. Small collapsing follicle or functional cyst in the right ovary. No adnexal masses. Other: There is stranding in the lower pelvis, likely related to recent hysterectomy. Locules of peritoneal or extraperitoneal gas noted in the left pelvis and extending into the left groin. Gas also noted within the right anterior abdominal wall. These findings are presumably related to recent surgery. Musculoskeletal: No acute bony abnormality. IMPRESSION: Postoperative changes from recent hysterectomy. Stranding in the pelvis is likely related to recent postoperative state. Locules of peritoneal or extraperitoneal gas in the left pelvis and left groin. Gas within the right  anterior abdominal wall and abdominal wall musculature. Mild right hydronephrosis and slight delayed excretion of contrast from the right kidney. The right ureter is dilated into the pelvis, but not visualized well beyond the right ovary. No visible obstructing stones. Suspect mild fatty infiltration of the liver. Bibasilar airspace opacities, right greater than left, atelectasis versus infiltrates. Electronically Signed: By: Rolm Baptise M.D. On: 10/02/2017 11:40   US Renal  Result Date: 10/03/2017 CLINICAL DATA:  Right hydronephrosis EXAM: RENAL / URINARY TRACT ULTRASOUND COMPLETE COMPARISON:  CT 10/02/2017 FINDINGS: Right Kidney: Length: 15.0. Mild hydronephrosis unchanged from prior CT. Negative for stone or calculus. Left Kidney: Length: 13.5 cm. Echogenicity within normal limits. No mass or hydronephrosis visualized. Bladder: Empty bladder with Foley catheter IMPRESSION: Mild right hydronephrosis, unchanged from CT scan yesterday. Cause of obstruction not identified. Normal left kidney. Electronically Signed   By: Franchot Gallo M.D.   On: 10/03/2017 08:50    I independently reviewed the above imaging studies.  Impression/Recommendation Right hydronephrosis Right flank pain POD #  5 s/p robotic-assisted bilateral salpingectomy, left oopherectomy and hysterectomy with lysis of adhesion and cystoscopy  -The risks, benefits and alternatives of cystoscopy with right retrograde pyelogram and right JJ stent placement was discussed with the patient.  Risks include bleeding, urinary tract infection, chronic pain secondary to her stent, the inability to access the upper urinary tract in a retrograde fashion requiring percutaneous nephrostomy tube placement and the inherent risks with general anesthesia.  She voices understanding and wishes to proceed  Ellison Hughs, [facility] 10/03/2017, 12:55 PM

## 2017-10-03 NOTE — Discharge Summary (Signed)
Physician Discharge Summary  Patient ID: Simrat Kendrick MRN: [id number] DOB/AGE: July 07, 1981 36 y.o.  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  Renal US showed no improvement on HD 1. Uro consulted and dis cystoscopy with retrograde pyelogram showing edematous UV junction but no other pathology, stricture or interuption of ureter.  Stent placed.  Pt ready to go home about 4 hours post op.  Constipation also contributing to sx and finally relieved a day after an enema.  Consults: urology  Significant Diagnostic Studies: radiology: CT scan: see result, Ultrasound: see result and retrograde pyelogram  Treatments: cystoscopy with stent placement, IV hydration, enema  Discharge Exam: Blood pressure (!) 150/81, pulse 86, temperature 98.6 F (37 C), temperature source Oral, resp. rate 18, height 5\' 6"  (1.676 m), weight 194 lb (88 kg), last menstrual period 09/11/2017, SpO2 99 %.   Disposition: 01-Home or Self Care  Discharge Instructions    Call MD for:  temperature >100.4   Complete by:  As directed    Diet - low sodium heart healthy   Complete by:  As directed    Discharge instructions   Complete by:  As directed    No driving on narcotics, no sexual activity for 2 weeks.   Increase activity slowly   Complete by:  As directed    May shower / Bathe   Complete by:  As directed    Shower, no bath for 2 weeks.   Remove dressing in 24 hours   Complete by:  As directed    Sexual Activity Restrictions   Complete by:  As directed    No sexual activity for 2 weeks.     Allergies as of 10/03/2017      Reactions   Bee Venom Anaphylaxis   Penicillins Anaphylaxis, Other (See Comments)   Has patient had a PCN reaction causing  immediate rash, facial/tongue/throat swelling, SOB or lightheadedness with hypotension: Yes Has patient had a PCN reaction causing severe rash involving mucus membranes or skin necrosis: No Has patient had a PCN reaction that required hospitalization: Yes Has patient had a PCN reaction occurring within the last 10 years: No If all of the above answers are "NO", then may proceed with Cephalosporin use.   Mushroom Extract Complex Nausea And Vomiting   Powder Itching, Rash, Other (See Comments)   The powder from latex gloves      Medication List    STOP taking these medications   ferrous sulfate 325 (65 FE) MG tablet   oxyCODONE-acetaminophen 5-325 MG tablet Commonly known as:  PERCOCET/ROXICET     TAKE these medications   Diclofenac Sodium 3 % Gel Apply 2-3 g 4 (four) times daily topically.   Doxepin HCl 5 % Crea Apply 2 g 3 (three) times daily topically.   gabapentin 800 MG tablet Commonly known as:  NEURONTIN Take 1 tablet (800 mg total) by mouth 3 (three) times daily.   lidocaine 5 % ointment Commonly known as:  XYLOCAINE Apply 2-3 g 4 (four) times daily topically.   losartan 25 MG tablet Commonly known as:  COZAAR Take 1 tablet (25 mg total) daily by mouth.   metFORMIN 500 MG tablet Commonly known as:  GLUCOPHAGE Take 1 tablet (500 mg total) by mouth 2 (two) times daily with a  meal.   MG217 PSORIASIS MULTI-SYMPTOM EX Apply 1 application as needed topically (for psoriasis).   rosuvastatin 20 MG tablet Commonly known as:  CRESTOR Take 1 tablet (20 mg total) by mouth daily.   sertraline 100 MG tablet Commonly known as:  ZOLOFT Take 100 mg daily by mouth.   traMADol 50 MG tablet Commonly known as:  ULTRAM Take 1 tablet (50 mg total) by mouth every 6 (six) hours as needed.   vitamin B-12 1000 MCG tablet Commonly known as:  CYANOCOBALAMIN Take 1,000 mcg daily by mouth.      Follow-up Information    Bobbye Charleston, MD Follow up in 1 week(s).   Specialty:   Obstetrics and Gynecology Contact information: [address] [address] [phone]        Ceasar Mons, MD Follow up in 6 week(s).   Specialty:  Urology Contact information: [place] [place] [address] [phone]           Signed: Daria Pastures 10/03/2017, 7:46 PM

## 2017-10-03 NOTE — Interval H&P Note (Signed)
History and Physical Interval Note:  10/03/2017 2:55 PM  [name]  has presented today for surgery, with the diagnosis of RIGHT HYDRONEPHROSIS  The various methods of treatment have been discussed with the patient and family. After consideration of risks, benefits and other options for treatment, the patient has consented to  Procedure(s) with comments: CYSTOSCOPY WITH RETROGRADE PYELOGRAM/URETERAL STENT PLACEMENT (Right) - ONLY NEEDS 30 MIN FOR PROCEDURE as a surgical intervention .  The patient's history has been reviewed, patient examined, no change in status, stable for surgery.  I have reviewed the patient's chart and labs.  Questions were answered to the patient's satisfaction.     [name]

## 2017-10-03 NOTE — Anesthesia Postprocedure Evaluation (Signed)
Anesthesia Post Note  Patient: [name]  Procedure(s) Performed: CYSTOSCOPY WITH RETROGRADE PYELOGRAM/URETERAL STENT PLACEMENT (Right Urethra)     Patient location during evaluation: PACU Anesthesia Type: General Level of consciousness: awake and alert Pain management: pain level controlled Vital Signs Assessment: post-procedure vital signs reviewed and stable Respiratory status: spontaneous breathing, nonlabored ventilation, respiratory function stable and patient connected to nasal cannula oxygen Cardiovascular status: blood pressure returned to baseline and stable Postop Assessment: no apparent nausea or vomiting Anesthetic complications: no    Last Vitals:  Vitals:   10/03/17 1640 10/03/17 1650  BP: (!) 158/84 (!) 158/74  Pulse: 64 62  Resp: 17 16  Temp: 37.2 C 37.1 C  SpO2:  95%    Last Pain:  Vitals:   10/03/17 1330  TempSrc:   PainSc: 3    Pain Goal: Patients Stated Pain Goal: 3 (10/03/17 1330)               [place]

## 2017-10-03 NOTE — Progress Notes (Signed)
Urology  Will plan on cystoscopy and possible right JJ stent this afternoon around 4:15 pm.  I will come and speak with the patient around lunch time to go over the procedure.  Ellison Hughs, MD [facility]

## 2017-10-03 NOTE — Interval H&P Note (Signed)
History and Physical Interval Note:  10/03/2017 1:05 PM  [name]  has presented today for surgery, with the diagnosis of RIGHT HYDRONEPHROSIS  The various methods of treatment have been discussed with the patient and family. After consideration of risks, benefits and other options for treatment, the patient has consented to  Procedure(s) with comments: CYSTOSCOPY WITH RETROGRADE PYELOGRAM/URETERAL STENT PLACEMENT (Right) - ONLY NEEDS 30 MIN FOR PROCEDURE as a surgical intervention .  The patient's history has been reviewed, patient examined, no change in status, stable for surgery.  I have reviewed the patient's chart and labs.  Questions were answered to the patient's satisfaction.     [name]

## 2017-10-03 NOTE — Anesthesia Procedure Notes (Signed)
Procedure Name: LMA Insertion Date/Time: 10/03/2017 3:10 PM Performed by: Hewitt Blade, CRNA Pre-anesthesia Checklist: Patient identified, Emergency Drugs available, Suction available and Patient being monitored Patient Re-evaluated:Patient Re-evaluated prior to induction Oxygen Delivery Method: Circle system utilized Preoxygenation: Pre-oxygenation with 100% oxygen Induction Type: IV induction LMA: LMA inserted LMA Size: 4.0 Number of attempts: 1 Placement Confirmation: positive ETCO2 and breath sounds checked- equal and bilateral Tube secured with: Tape Dental Injury: Teeth and Oropharynx as per pre-operative assessment

## 2017-10-03 NOTE — Interval H&P Note (Signed)
History and Physical Interval Note:  10/03/2017 2:55 PM  [name]  has presented today for surgery, with the diagnosis of RIGHT HYDRONEPHROSIS  The various methods of treatment have been discussed with the patient and family. After consideration of risks, benefits and other options for treatment, the patient has consented to  Procedure(s) with comments: CYSTOSCOPY WITH RETROGRADE PYELOGRAM/URETERAL STENT PLACEMENT (Right) - ONLY NEEDS 30 MIN FOR PROCEDURE as a surgical intervention .  The patient's history has been reviewed, patient examined, no change in status, stable for surgery.  I have reviewed the patient's chart and labs.  Questions were answered to the patient's satisfaction.     Conception Oms Chelcy Bolda

## 2017-10-03 NOTE — Brief Op Note (Signed)
10/02/2017 - 10/03/2017  3:41 PM  PATIENT:  [name]  36 y.o. female  PRE-OPERATIVE DIAGNOSIS:  RIGHT HYDRONEPHROSIS  POST-OPERATIVE DIAGNOSIS:  RIGHT HYDRONEPHROSIS  PROCEDURE:  Procedure(s) with comments: CYSTOSCOPY WITH RETROGRADE PYELOGRAM/URETERAL STENT PLACEMENT (Right) - ONLY NEEDS 30 MIN FOR PROCEDURE  SURGEON:  Surgeon(s) and Role:    * Winter, Conception Oms, MD - Primary  PHYSICIAN ASSISTANT:   ASSISTANTS: none   ANESTHESIA:   general  EBL:  1 mL   BLOOD ADMINISTERED:none  DRAINS: Right 6 French JJ stent   LOCAL MEDICATIONS USED:  NONE  SPECIMEN:  No Specimen  DISPOSITION OF SPECIMEN:  N/A  COUNTS:  YES  TOURNIQUET:  * No tourniquets in log *  DICTATION: .Dragon Dictation  PLAN OF CARE: Return to floor  PATIENT DISPOSITION:  PACU - hemodynamically stable.   Delay start of Pharmacological VTE agent (>24hrs) due to surgical blood loss or risk of bleeding: not applicable  S/p right cystoscopy with JJ stent placement 2/2 hydronephrosis.  Stent in good position on fluoro

## 2017-10-03 NOTE — Progress Notes (Signed)
Pt more comfortable now.  Desires to go home. Procedure revealed edema at UV junction but no other pathologies of the ureter.  Vitals:   10/03/17 1640 10/03/17 1650 10/03/17 1805 10/03/17 1846  BP: (!) 158/84 (!) 158/74 (!) 171/97 (!) 150/81  Pulse: 64 62 78 86  Resp: 17 16 18 18   Temp: 99 F (37.2 C) 98.8 F (37.1 C) 98.6 F (37 C)   TempSrc:   Oral   SpO2:  95% 99% 99%  Weight:      Height:        Will f/u with pt next week for incisions.  Will give Ultram for pain control.

## 2017-10-03 NOTE — Anesthesia Preprocedure Evaluation (Signed)
Anesthesia Evaluation  Patient identified by MRN, date of birth, ID band Patient awake    Reviewed: Allergy & Precautions, H&P , NPO status , Patient's Chart, lab work & pertinent test results  Airway Mallampati: II   Neck ROM: full    Dental   Pulmonary Current Smoker,    breath sounds clear to auscultation       Cardiovascular hypertension,  Rhythm:regular Rate:Normal     Neuro/Psych  Headaches,  Neuromuscular disease    GI/Hepatic GERD  ,  Endo/Other  diabetes, Type 2  Renal/GU      Musculoskeletal  (+) Arthritis ,   Abdominal   Peds  Hematology   Anesthesia Other Findings   Reproductive/Obstetrics                             Anesthesia Physical Anesthesia Plan  ASA: II  Anesthesia Plan: General   Post-op Pain Management:    Induction: Intravenous  PONV Risk Score and Plan: 2 and Ondansetron, Dexamethasone, Treatment may vary due to age or medical condition and Midazolam  Airway Management Planned: LMA  Additional Equipment:   Intra-op Plan:   Post-operative Plan:   Informed Consent: I have reviewed the patients History and Physical, chart, labs and discussed the procedure including the risks, benefits and alternatives for the proposed anesthesia with the patient or authorized representative who has indicated his/her understanding and acceptance.     Plan Discussed with: CRNA, Anesthesiologist and Surgeon  Anesthesia Plan Comments:         Anesthesia Quick Evaluation

## 2017-10-03 NOTE — Transfer of Care (Signed)
Immediate Anesthesia Transfer of Care Note  Patient: [name]  Procedure(s) Performed: CYSTOSCOPY WITH RETROGRADE PYELOGRAM/URETERAL STENT PLACEMENT (Right Urethra)  Patient Location: PACU  Anesthesia Type:General  Level of Consciousness: awake, alert , oriented and patient cooperative  Airway & Oxygen Therapy: Patient Spontanous Breathing and Patient connected to nasal cannula oxygen  Post-op Assessment: Report given to RN and Post -op Vital signs reviewed and stable  Post vital signs: Reviewed and stable  Last Vitals:  Vitals:   10/03/17 1100 10/03/17 1158  BP: (!) 147/91 (!) 163/93  Pulse: 60 63  Resp: 18 18  Temp: 36.9 C 36.7 C  SpO2: 98% 98%    Last Pain:  Vitals:   10/03/17 1330  TempSrc:   PainSc: 3       Patients Stated Pain Goal: 3 (16/10/96 0454)  Complications: No apparent anesthesia complications

## 2017-10-03 NOTE — Op Note (Signed)
Operative Note  Preoperative diagnosis:  1.  Right hydronephrosis  Postoperative diagnosis: 1.  Same  Procedure(s): 1.  Cystoscopy 2.  Right retrograde pyelogram 3.  Right JJ stent placement  Surgeon: Ellison Hughs, MD  Assistants:  None  Anesthesia:  Gen. LMA  Complications:  None  EBL:  <5 mL  Specimens: 1. None  Drains/Catheters: 1.  Right 6 French x 26 cm JJ stent w/o tether  Intraoperative findings:    1.  Erythematous and edematous lesion involving the right posterior aspects of the bladder adjacent to the right ureteral orifice 2.  Uniform dilation of the right ureter with no obvious area of stricture, especially in the distal aspects  Indication:  [name] is a 36 y.o. female with female status post bilateral salpingectomy, left oophorectomy  and total hysterectomy on 09/28/2017. (Cystoscopy performed at that time showed peristalsis of both ureters.) The patient presented over the weekend with worsening right-sided flank pain associated with nausea/vomiting.  She had a subsequent CT scan that showed mild hydronephrosis on the right side with no obvious transition point along the course of the right ureter as well as a renal ultrasound today confirming persistent hydronephrosis.  Her renal function has been normal throughout this episode.   Description of procedure:  After informed consent was obtained, the patient was brought to the operating room and general LMA anesthesia was administered.  The patient was placed in the dorsolithotomy position and prepped and draped in usual sterile fashion.  A timeout was performed.  A 22 French rigid cystoscope was inserted into the urethral meatus and advanced into the bladder under direct vision.  I completely bladder survey revealed a 1 x 1 cm erythematous and edematous lesion involving the right posterior bladder wall, adjacent to the right ureteral orifice.  No other intravesical pathology was identified.  A 6  [place] open-ended catheter was then used to intubate the right ureteral orifice and a retrograde pyelogram was obtained, which showed uniform dilation of the right ureter along its entire length, with crisp outlining of the renal pelvis and cayles.  No filling defects are seen along the entire length of the right collecting system and there was no obvious area of stricture, especially involving the right distal ureter.  A Glidewire was advanced through the lumen of the ureteral catheter and up to the right renal pelvis, confirmed by fluoroscopy.  A 6 French by 26 cm JJ stent was then placed over the wire and into good position within the right collecting system, confirming placement by fluoroscopy.  The patient's bladder was then drained.  She tolerated the procedure well and was transferred to the postanesthesia in stable condition.  Plan:  Will arrange outpatient follow up for stent removal in 6 weeks and subsequent RUS in 8 weeks.

## 2017-10-04 LAB — GLUCOSE, CAPILLARY: Glucose-Capillary: 124 mg/dL — ABNORMAL HIGH (ref 65–99)

## 2017-10-05 ENCOUNTER — Encounter (HOSPITAL_COMMUNITY): Payer: Self-pay | Admitting: Urology

## 2017-11-01 DIAGNOSIS — F32A Depression, unspecified: Secondary | ICD-10-CM

## 2017-11-01 HISTORY — DX: Depression, unspecified: F32.A

## 2018-01-10 ENCOUNTER — Other Ambulatory Visit: Payer: Self-pay | Admitting: Urology

## 2018-01-11 ENCOUNTER — Other Ambulatory Visit: Payer: Self-pay

## 2018-01-11 ENCOUNTER — Encounter (HOSPITAL_BASED_OUTPATIENT_CLINIC_OR_DEPARTMENT_OTHER): Payer: Self-pay | Admitting: Emergency Medicine

## 2018-01-11 NOTE — Progress Notes (Addendum)
SPOKE WITH: patient  RIDING HOME WITH: husband Dominica Severin  AM MEDICATIONS: sertraline, crestor, losartan  NPO STATUS: npo after midnight including gum mint candy; sips of water with medications LABS: needs istat chem 4 COMMENTS/CONCERNS: type 2 diabetic; RN advised no smoking 24 hours before surgery

## 2018-01-12 ENCOUNTER — Other Ambulatory Visit: Payer: Self-pay | Admitting: Urology

## 2018-01-16 ENCOUNTER — Other Ambulatory Visit: Payer: Self-pay | Admitting: Urology

## 2018-01-17 ENCOUNTER — Other Ambulatory Visit: Payer: Self-pay

## 2018-01-17 ENCOUNTER — Ambulatory Visit: Payer: Medicaid Other | Admitting: Internal Medicine

## 2018-01-17 ENCOUNTER — Encounter: Payer: Self-pay | Admitting: Internal Medicine

## 2018-01-17 VITALS — BP 152/92 | HR 74 | Temp 98.6°F | Wt 203.0 lb

## 2018-01-17 DIAGNOSIS — E1165 Type 2 diabetes mellitus with hyperglycemia: Secondary | ICD-10-CM | POA: Diagnosis not present

## 2018-01-17 DIAGNOSIS — I10 Essential (primary) hypertension: Secondary | ICD-10-CM | POA: Diagnosis present

## 2018-01-17 DIAGNOSIS — E785 Hyperlipidemia, unspecified: Secondary | ICD-10-CM

## 2018-01-17 DIAGNOSIS — F411 Generalized anxiety disorder: Secondary | ICD-10-CM | POA: Insufficient documentation

## 2018-01-17 LAB — POCT GLYCOSYLATED HEMOGLOBIN (HGB A1C): Hemoglobin A1C: 6.3

## 2018-01-17 MED ORDER — LOSARTAN POTASSIUM 25 MG PO TABS: 25.0000 mg | ORAL_TABLET | Freq: Every day | ORAL | 2 refills | Status: DC

## 2018-01-17 MED ORDER — METFORMIN HCL 500 MG PO TABS: 500.0000 mg | ORAL_TABLET | Freq: Two times a day (BID) | ORAL | 3 refills | Status: DC

## 2018-01-17 MED ORDER — ROSUVASTATIN CALCIUM 20 MG PO TABS: 20.0000 mg | ORAL_TABLET | Freq: Every day | ORAL | 2 refills | Status: DC

## 2018-01-17 MED ORDER — SERTRALINE HCL 100 MG PO TABS: 100.0000 mg | ORAL_TABLET | Freq: Every day | ORAL | 2 refills | Status: DC

## 2018-01-17 NOTE — Patient Instructions (Addendum)
I want you start Zoloft at 50 mg for 1 week and then you can increase to 100 mg. Please follow up in about 1 month. I want you to talk to the neurologist about your headaches

## 2018-01-17 NOTE — Progress Notes (Signed)
   [facility] Kerrin Mo, MD Phone: [phone]  Reason For Visit: Follow-up  Has not been taking any of her medications recently.  She states she ran out of them about a couple months ago and just and is just stopped taking them   # CHRONIC DM, Type 2: Has not been taking her metformin recently because she ran out of it. States she has been very busy lately and just has not had time to think about herself Any hypoglycemia episodes: Denies palpations, diaphoresis, fatigue, weakness, jittery Denies polyuria, visual changes  CHRONIC HTN: Reports has not been taking her Cozaar or Denies CP, dyspnea, HA, edema, dizziness / lightheadedness  HYPERLIPIDEMIA Disease Monitoring: See symptoms for Hypertension Medications: Compliance- None  No Right upper quadrant pain, Muscle aches  Tobacco Abuse  discussed with patient the importance of tobacco cessation.  Explained to her that her ability to heal is significantly reduced by the use of tobacco   Anxiety  GAD-7  Past Medical History Reviewed problem list.  Medications- reviewed and updated No additions to family history Social history- patient is a smoker  Objective: BP (!) 152/92   Pulse 74   Temp 98.6 F (37 C) (Oral)   Wt 203 lb (92.1 kg)   LMP 09/11/2017 (Approximate)   SpO2 99%   BMI 32.77 kg/m  Gen: NAD, alert, cooperative with exam Cardio: regular rate and rhythm, S1S2 heard, no murmurs appreciated Pulm: clear to auscultation bilaterally, no wheezes, rhonchi or rales Extremities: warm, well perfused, No edema, cyanosis or clubbing;  MSK: Normal gait and station Skin: dry, intact, no rashes or lesions    Assessment/Plan: See problem based a/p  Diabetes type 2, controlled (HCC)  obtain A1c  restart metformin  Hypertension  Not controlled, due to patient noncompliance  restart losartan Obtain be met Follow-up in 1 month   Anxiety state Gad 7 of 11 High level of anxiety    restart Zoloft-will start at lower dose and increase back up to regular dose over the next month Follow-up in 1 month   Hyperlipidemia Restart statin

## 2018-01-18 ENCOUNTER — Ambulatory Visit (HOSPITAL_BASED_OUTPATIENT_CLINIC_OR_DEPARTMENT_OTHER): Payer: Medicaid Other | Admitting: Anesthesiology

## 2018-01-18 ENCOUNTER — Encounter (HOSPITAL_BASED_OUTPATIENT_CLINIC_OR_DEPARTMENT_OTHER): Payer: Self-pay | Admitting: Anesthesiology

## 2018-01-18 ENCOUNTER — Ambulatory Visit (HOSPITAL_BASED_OUTPATIENT_CLINIC_OR_DEPARTMENT_OTHER)
Admission: RE | Admit: 2018-01-18 | Discharge: 2018-01-18 | Disposition: A | Discharge status: Home / Self Care | Payer: Medicaid Other | Source: Ambulatory Visit | Attending: Urology | Admitting: Urology

## 2018-01-18 ENCOUNTER — Encounter (HOSPITAL_BASED_OUTPATIENT_CLINIC_OR_DEPARTMENT_OTHER)
Admission: RE | Disposition: A | Discharge status: Home / Self Care | Payer: Self-pay | Source: Ambulatory Visit | Attending: Urology

## 2018-01-18 ENCOUNTER — Other Ambulatory Visit: Payer: Self-pay

## 2018-01-18 DIAGNOSIS — Z880 Allergy status to penicillin: Secondary | ICD-10-CM | POA: Diagnosis not present

## 2018-01-18 DIAGNOSIS — R32 Unspecified urinary incontinence: Secondary | ICD-10-CM | POA: Diagnosis present

## 2018-01-18 DIAGNOSIS — Z7984 Long term (current) use of oral hypoglycemic drugs: Secondary | ICD-10-CM | POA: Insufficient documentation

## 2018-01-18 DIAGNOSIS — Z8619 Personal history of other infectious and parasitic diseases: Secondary | ICD-10-CM | POA: Insufficient documentation

## 2018-01-18 DIAGNOSIS — Z8614 Personal history of Methicillin resistant Staphylococcus aureus infection: Secondary | ICD-10-CM | POA: Diagnosis not present

## 2018-01-18 DIAGNOSIS — K219 Gastro-esophageal reflux disease without esophagitis: Secondary | ICD-10-CM | POA: Insufficient documentation

## 2018-01-18 DIAGNOSIS — Z79899 Other long term (current) drug therapy: Secondary | ICD-10-CM | POA: Diagnosis not present

## 2018-01-18 DIAGNOSIS — F17210 Nicotine dependence, cigarettes, uncomplicated: Secondary | ICD-10-CM | POA: Insufficient documentation

## 2018-01-18 DIAGNOSIS — N821 Other female urinary-genital tract fistulae: Secondary | ICD-10-CM | POA: Diagnosis not present

## 2018-01-18 DIAGNOSIS — L409 Psoriasis, unspecified: Secondary | ICD-10-CM | POA: Insufficient documentation

## 2018-01-18 DIAGNOSIS — I10 Essential (primary) hypertension: Secondary | ICD-10-CM | POA: Insufficient documentation

## 2018-01-18 DIAGNOSIS — Z888 Allergy status to other drugs, medicaments and biological substances status: Secondary | ICD-10-CM | POA: Diagnosis not present

## 2018-01-18 DIAGNOSIS — E119 Type 2 diabetes mellitus without complications: Secondary | ICD-10-CM | POA: Insufficient documentation

## 2018-01-18 HISTORY — DX: Type 2 diabetes mellitus with diabetic polyneuropathy: E11.42

## 2018-01-18 HISTORY — DX: Spondylolysis, lumbosacral region: M43.07

## 2018-01-18 HISTORY — DX: Unspecified urinary incontinence: R32

## 2018-01-18 HISTORY — DX: Anxiety disorder, unspecified: F41.9

## 2018-01-18 HISTORY — DX: Personal history of other infectious and parasitic diseases: Z86.19

## 2018-01-18 HISTORY — DX: Nocturia: R35.1

## 2018-01-18 HISTORY — PX: CYSTOSCOPY: SHX5120

## 2018-01-18 HISTORY — DX: Personal history of other diseases of urinary system: Z87.448

## 2018-01-18 LAB — BASIC METABOLIC PANEL
BUN/Creatinine Ratio: 14 (ref 9–23)
BUN: 8 mg/dL (ref 6–20)
CO2: 19 mmol/L — ABNORMAL LOW (ref 20–29)
Calcium: 9.3 mg/dL (ref 8.7–10.2)
Chloride: 105 mmol/L (ref 96–106)
Creatinine, Ser: 0.57 mg/dL (ref 0.57–1.00)
GFR calc Af Amer: 135 mL/min/{1.73_m2} (ref >59)
GFR calc non Af Amer: 117 mL/min/{1.73_m2} (ref >59)
Glucose: 155 mg/dL — ABNORMAL HIGH (ref 65–99)
Potassium: 4 mmol/L (ref 3.5–5.2)
Sodium: 141 mmol/L (ref 134–144)

## 2018-01-18 LAB — POCT I-STAT 4, (NA,K, GLUC, HGB,HCT)
Glucose, Bld: 102 mg/dL — ABNORMAL HIGH (ref 65–99)
HCT: 40 % (ref 36.0–46.0)
Hemoglobin: 13.6 g/dL (ref 12.0–15.0)
Potassium: 3.9 mmol/L (ref 3.5–5.1)
Sodium: 142 mmol/L (ref 135–145)

## 2018-01-18 LAB — GLUCOSE, CAPILLARY: Glucose-Capillary: 90 mg/dL (ref 65–99)

## 2018-01-18 SURGERY — CYSTOSCOPY
Anesthesia: General | Site: Ureter | Laterality: Right

## 2018-01-18 MED ORDER — PHENAZOPYRIDINE HCL 200 MG PO TABS
200.0000 mg | ORAL_TABLET | Freq: Once | ORAL | Status: AC
  Administered 2018-01-18: 200 mg via ORAL
  Filled 2018-01-18: qty 1

## 2018-01-18 MED ORDER — CIPROFLOXACIN IN D5W 400 MG/200ML IV SOLN
400.0000 mg | Freq: Once | INTRAVENOUS | Status: AC
  Administered 2018-01-18: 400 mg via INTRAVENOUS
  Filled 2018-01-18: qty 200

## 2018-01-18 MED ORDER — FENTANYL CITRATE (PF) 100 MCG/2ML IJ SOLN
INTRAMUSCULAR | Status: AC
  Filled 2018-01-18: qty 2

## 2018-01-18 MED ORDER — OXYCODONE HCL 5 MG/5ML PO SOLN
5.0000 mg | Freq: Once | ORAL | Status: DC | PRN
  Filled 2018-01-18: qty 5

## 2018-01-18 MED ORDER — KETOROLAC TROMETHAMINE 30 MG/ML IJ SOLN
INTRAMUSCULAR | Status: AC
  Filled 2018-01-18: qty 1

## 2018-01-18 MED ORDER — ONDANSETRON HCL 4 MG/2ML IJ SOLN
INTRAMUSCULAR | Status: AC
  Filled 2018-01-18: qty 2

## 2018-01-18 MED ORDER — DEXAMETHASONE SODIUM PHOSPHATE 4 MG/ML IJ SOLN
INTRAMUSCULAR | Status: DC | PRN
  Administered 2018-01-18: 10 mg via INTRAVENOUS

## 2018-01-18 MED ORDER — DEXAMETHASONE SODIUM PHOSPHATE 10 MG/ML IJ SOLN
INTRAMUSCULAR | Status: AC
  Filled 2018-01-18: qty 1

## 2018-01-18 MED ORDER — LIDOCAINE HCL (CARDIAC) 20 MG/ML IV SOLN
INTRAVENOUS | Status: DC | PRN
  Administered 2018-01-18: 100 mg via INTRAVENOUS

## 2018-01-18 MED ORDER — CIPROFLOXACIN IN D5W 400 MG/200ML IV SOLN
INTRAVENOUS | Status: AC
  Filled 2018-01-18: qty 200

## 2018-01-18 MED ORDER — CLINDAMYCIN PHOSPHATE 900 MG/50ML IV SOLN
INTRAVENOUS | Status: DC | PRN
  Administered 2018-01-18: 900 mg via INTRAVENOUS

## 2018-01-18 MED ORDER — FENTANYL CITRATE (PF) 100 MCG/2ML IJ SOLN
25.0000 ug | INTRAMUSCULAR | Status: DC | PRN
  Administered 2018-01-18: 25 ug via INTRAVENOUS
  Filled 2018-01-18: qty 1

## 2018-01-18 MED ORDER — CLINDAMYCIN PHOSPHATE 900 MG/50ML IV SOLN
INTRAVENOUS | Status: AC
  Filled 2018-01-18: qty 50

## 2018-01-18 MED ORDER — IOHEXOL 300 MG/ML  SOLN
INTRAMUSCULAR | Status: DC | PRN
  Administered 2018-01-18: 5 mL

## 2018-01-18 MED ORDER — LACTATED RINGERS IV SOLN
INTRAVENOUS | Status: DC
  Administered 2018-01-18: 09:00:00 via INTRAVENOUS
  Filled 2018-01-18: qty 1000

## 2018-01-18 MED ORDER — MIDAZOLAM HCL 2 MG/2ML IJ SOLN
INTRAMUSCULAR | Status: DC | PRN
  Administered 2018-01-18: 1 mg via INTRAVENOUS

## 2018-01-18 MED ORDER — PHENAZOPYRIDINE HCL 100 MG PO TABS
ORAL_TABLET | ORAL | Status: AC
  Filled 2018-01-18: qty 2

## 2018-01-18 MED ORDER — PROPOFOL 10 MG/ML IV BOLUS
INTRAVENOUS | Status: AC
  Filled 2018-01-18: qty 40

## 2018-01-18 MED ORDER — PROPOFOL 10 MG/ML IV BOLUS
INTRAVENOUS | Status: DC | PRN
  Administered 2018-01-18: 20 mg via INTRAVENOUS
  Administered 2018-01-18: 200 mg via INTRAVENOUS

## 2018-01-18 MED ORDER — ONDANSETRON HCL 4 MG PO TABS: 4.0000 mg | ORAL_TABLET | Freq: Every day | ORAL | 1 refills | Status: AC | PRN

## 2018-01-18 MED ORDER — MIDAZOLAM HCL 2 MG/2ML IJ SOLN
INTRAMUSCULAR | Status: AC
  Filled 2018-01-18: qty 2

## 2018-01-18 MED ORDER — INDIGOTINDISULFONATE SODIUM 8 MG/ML IJ SOLN
INTRAMUSCULAR | Status: DC | PRN
  Administered 2018-01-18: 4 mL

## 2018-01-18 MED ORDER — KETOROLAC TROMETHAMINE 10 MG PO TABS: 10.0000 mg | ORAL_TABLET | Freq: Four times a day (QID) | ORAL | 0 refills | Status: DC | PRN

## 2018-01-18 MED ORDER — SODIUM CHLORIDE 0.9 % IR SOLN
Status: DC | PRN
  Administered 2018-01-18: 1000 mL via INTRAVESICAL
  Administered 2018-01-18 (×2): 3000 mL via INTRAVESICAL

## 2018-01-18 MED ORDER — OXYCODONE HCL 5 MG PO TABS
5.0000 mg | ORAL_TABLET | Freq: Once | ORAL | Status: DC | PRN
  Filled 2018-01-18: qty 1

## 2018-01-18 MED ORDER — PROMETHAZINE HCL 25 MG/ML IJ SOLN
6.2500 mg | INTRAMUSCULAR | Status: DC | PRN
  Filled 2018-01-18: qty 1

## 2018-01-18 MED ORDER — LIDOCAINE 2% (20 MG/ML) 5 ML SYRINGE
INTRAMUSCULAR | Status: AC
  Filled 2018-01-18: qty 5

## 2018-01-18 MED ORDER — PHENAZOPYRIDINE HCL 200 MG PO TABS: 200.0000 mg | ORAL_TABLET | Freq: Three times a day (TID) | ORAL | 0 refills | Status: AC | PRN

## 2018-01-18 MED ORDER — FENTANYL CITRATE (PF) 250 MCG/5ML IJ SOLN
INTRAMUSCULAR | Status: DC | PRN
  Administered 2018-01-18: 100 ug via INTRAVENOUS

## 2018-01-18 MED ORDER — SULFAMETHOXAZOLE-TRIMETHOPRIM 800-160 MG PO TABS: 1.0000 | ORAL_TABLET | Freq: Two times a day (BID) | ORAL | 0 refills | Status: DC

## 2018-01-18 MED ORDER — ONDANSETRON HCL 4 MG/2ML IJ SOLN
INTRAMUSCULAR | Status: DC | PRN
  Administered 2018-01-18: 4 mg via INTRAVENOUS

## 2018-01-18 SURGICAL SUPPLY — 16 items
BAG DRAIN URO-CYSTO SKYTR STRL (DRAIN) ×2 IMPLANT
CATH URET 5FR 28IN OPEN ENDED (CATHETERS) ×2 IMPLANT
CLOTH BEACON ORANGE TIMEOUT ST (SAFETY) ×2 IMPLANT
GOWN STRL REUS W/ TWL XL LVL3 (GOWN DISPOSABLE) ×2 IMPLANT
GOWN STRL REUS W/TWL XL LVL3 (GOWN DISPOSABLE) ×2
GUIDEWIRE STR DUAL SENSOR (WIRE) ×2 IMPLANT
IV NS 1000ML (IV SOLUTION) ×1
IV NS 1000ML BAXH (IV SOLUTION) ×1 IMPLANT
IV NS IRRIG 3000ML ARTHROMATIC (IV SOLUTION) ×4 IMPLANT
KIT TURNOVER CYSTO (KITS) ×2 IMPLANT
MANIFOLD NEPTUNE II (INSTRUMENTS) ×2 IMPLANT
NEEDLE HYPO 18GX1.5 BLUNT FILL (NEEDLE) IMPLANT
PACK CYSTO (CUSTOM PROCEDURE TRAY) ×2 IMPLANT
SYR 20CC LL (SYRINGE) IMPLANT
SYR CONTROL 10ML LL (SYRINGE) ×2 IMPLANT
TUBE CONNECTING 12X1/4 (SUCTIONS) ×2 IMPLANT

## 2018-01-18 NOTE — Op Note (Signed)
Operative Note  Preoperative diagnosis:  1.  Continuous incontinence  Postoperative diagnosis: 1.  Continuous incontinence 2.  Right ureterovaginal fistula  Procedure(s): 1.  Cystoscopy 2.  Right retrograde pyelogram with intraoperative interpretation of fluoroscopic imaging 3.  Right ureteroscopy  Surgeon: Ellison Hughs, MD  Assistants: None  Anesthesia: General LMA  Complications: None  EBL: Less than 5 mL  Specimens: 1.  None  Drains/Catheters: 1.  None  Intraoperative findings:   1.   Right retrograde pyelogram revealed evidence of contrast extravasation extending beyond the expected luminal course of the right ureter.  There were no other filling defects seen along the remainder of the right ureter and there was crisp outlining of all renal calyces.  2.  Urine effluxing from a fistulous connection was seen during vaginoscopy.  Methylene blue was then injected into the right ureter and a vaginal pad test confirmed a ureterovaginal fistula  3.  Distal right ureteral defect measuring approximately 5 mm was seen at the 3 o'clock position, consistent with the os of the a ureterovaginal fistula  Indication:  [name] is a 37 y.o. female seen in consultation for Dr. Philis Pique after the patient was found to have acute right flank pain and right sided hydronephrosis on CT following a BSO/TAH on 09/28/17. She underwent an uneventful right RPG and stent placement on 10/03/17. She had her stent removed on 11/10/17 and reports constant leakage of urine per vagina, since that time. There was no obvious intravesical pathology noted on her cysto at that time. She is having to wear 4-5 pads/day due to the constant leakage. She denies urinary urgency/frequency or dysuria. She is able to void what she perceives as normal amounts of non-bloody urine.    She had a CT urogram on 01/03/2018 that showed a mild irregularity of the right distal ureter just proximal to the right UVJ on  delayed imaging with no definitive evidence of a fistulous connection between the ureter and the vagina.  Description of procedure:  After informed consent was obtained, the patient was brought to the operating room and general LMA anesthesia was administered. The patient was then placed in the dorsolithotomy position and prepped and draped in usual sterile fashion. A timeout was performed. A 21 French rigid cystoscope was then inserted into the urethral meatus and advanced into the bladder under direct vision. A complete bladder survey revealed no intravesical pathology.  A right retrograde pyelogram was then obtained with evidence of contrast extravasation extending beyond the expected luminal course of the right ureter.  There were no other filling defects seen along the remainder of the right ureter and there was crisp outlining of all renal calyces.  A vaginoscopy was then performed that revealed a small aperture measuring 2-3 mm at the rightward vaginal cuff that appeared to be effluxing urine mixed with contrast.  There was no evidence of a foreign body or an infectious process involving the vaginal cuff.  A 4 x 4 gauze pad was then inserted into the posterior vaginal fornix and methylene blue was injected into the right ureteral orifice, cystoscopically.  The gauze pad was then removed and had methylene blue on it, confirming a ureterovaginal fistula.    A Glidewire was then advanced into the right ureteral orifice and up to the right renal pelvis, under fluoroscopic guidance.  A semirigid ureteroscope was then gently inserted into the right ureter and advanced until a 5 mm defect was seen at the 3 o'clock position, consistent with the a  fistulous os/opening.  There were no other concerning findings on ureteroscopy.  The patient's bladder was then drained.  She tolerated the procedure well and was transferred to the postanesthesia unit in stable condition.  Plan: I will have the patient follow-up  with me in 1 week to discuss a robotic right ureteral reimplant to treat her ureterovaginal fistula   Cc: Dr. Bobbye Charleston

## 2018-01-18 NOTE — Anesthesia Procedure Notes (Signed)
Procedure Name: LMA Insertion Date/Time: 01/18/2018 10:23 AM Performed by: Flossie Dibble, CRNA Pre-anesthesia Checklist: Patient identified, Patient being monitored, Emergency Drugs available, Timeout performed and Suction available Patient Re-evaluated:Patient Re-evaluated prior to induction Oxygen Delivery Method: Circle System Utilized Preoxygenation: Pre-oxygenation with 100% oxygen Induction Type: IV induction LMA: LMA inserted LMA Size: 4.0 Number of attempts: 1 Placement Confirmation: positive ETCO2 and breath sounds checked- equal and bilateral Tube secured with: Tape Dental Injury: Teeth and Oropharynx as per pre-operative assessment

## 2018-01-18 NOTE — Anesthesia Postprocedure Evaluation (Signed)
Anesthesia Post Note  Patient: [name]  Procedure(s) Performed: CYSTOSCOPY, RETROGRADE / PYELOGRAM, URETEROSCOPY (Right Ureter)     Patient location during evaluation: PACU Anesthesia Type: General Level of consciousness: awake and alert Pain management: pain level controlled Vital Signs Assessment: post-procedure vital signs reviewed and stable Respiratory status: spontaneous breathing, nonlabored ventilation and respiratory function stable Cardiovascular status: blood pressure returned to baseline and stable Postop Assessment: no apparent nausea or vomiting Anesthetic complications: no    Last Vitals:  Vitals:   01/18/18 1133 01/18/18 1216  BP:  138/65  Pulse: (!) 55 (!) 58  Resp: 17 17  Temp:  36.5 C  SpO2: 100% 100%    Last Pain:  Vitals:   01/18/18 1216  TempSrc: Oral  PainSc: 3                  Audry Pili

## 2018-01-18 NOTE — Transfer of Care (Signed)
Last Vitals:  Vitals:   01/18/18 0757 01/18/18 1107  BP: (!) 160/90 (!) 148/73  Pulse: 64 65  Resp: 18 18  Temp: 36.9 C 36.8 C  SpO2: 100% 99%    Last Pain:  Vitals:   01/18/18 0905  TempSrc:   PainSc: 0-No pain      Patients Stated Pain Goal: 10 (01/18/18 0905)  Immediate Anesthesia Transfer of Care Note  Patient: [name]  Procedure(s) Performed: Procedure(s) (LRB): CYSTOSCOPY, RETROGRADE / PYELOGRAM, URETEROSCOPY (Right)  Patient Location: PACU  Anesthesia Type: General  Level of Consciousness: awake, alert  and oriented  Airway & Oxygen Therapy: Patient Spontanous Breathing and Patient connected to nasal cannula oxygen  Post-op Assessment: Report given to PACU RN and Post -op Vital signs reviewed and stable  Post vital signs: Reviewed and stable  Complications: No apparent anesthesia complications

## 2018-01-18 NOTE — Anesthesia Preprocedure Evaluation (Signed)
Anesthesia Evaluation  Patient identified by MRN, date of birth, ID band Patient awake    Reviewed: Allergy & Precautions, H&P , NPO status , Patient's Chart, lab work & pertinent test results  Airway Mallampati: II  TM Distance: >3 FB Neck ROM: full    Dental  (+) Dental Advisory Given   Pulmonary Current Smoker,    Pulmonary exam normal breath sounds clear to auscultation       Cardiovascular hypertension, Pt. on medications Normal cardiovascular exam Rhythm:Regular Rate:Normal     Neuro/Psych  Headaches, Anxiety Occasional blurred vision (mostly with migraines); peripheral neuropathy  Neuromuscular disease    GI/Hepatic GERD  Controlled,  Endo/Other  diabetes, Type 2Obesity  Renal/GU      Musculoskeletal  (+) Arthritis ,   Abdominal   Peds  Hematology negative hematology ROS (+)   Anesthesia Other Findings   Reproductive/Obstetrics                             Anesthesia Physical  Anesthesia Plan  ASA: II  Anesthesia Plan: General   Post-op Pain Management:    Induction: Intravenous  PONV Risk Score and Plan: 2 and Ondansetron, Dexamethasone, Treatment may vary due to age or medical condition and Midazolam  Airway Management Planned: LMA  Additional Equipment: None  Intra-op Plan:   Post-operative Plan: Extubation in OR  Informed Consent: I have reviewed the patients History and Physical, chart, labs and discussed the procedure including the risks, benefits and alternatives for the proposed anesthesia with the patient or authorized representative who has indicated his/her understanding and acceptance.   Dental advisory given  Plan Discussed with: CRNA and Anesthesiologist  Anesthesia Plan Comments:         Anesthesia Quick Evaluation

## 2018-01-18 NOTE — H&P (Signed)
Urology Preoperative H&P   Chief Complaint: Urine leaking from vagina  History of Present Illness: [name] is a 37 y.o. female  seen in consultation for Dr. Philis Pique after the patient was found to have acute right flank pain and right sided hydronephrosis on CT following a BSO/TAH on 09/28/17. She underwent an uneventful right RPG and stent placement on 10/03/17. She had her stent removed on 11/10/17 and reports constant leakage of urine since that time. There was no obvious intravesical pathology noted on her cysto at that time. She is having to wear 4-5 pads/day due to the constant leakage. She denies urinary urgency/frequency or dysuria. She is able to void what she perceives as normal amounts of non-bloody urine.    Pelvic US from Dr. Malachi Carl office reviewed, but is difficult to interpret 2/2 static images. RUS from 12/23/17 showed no signs of right sided hydronephrosis.   CT Urogram (01/03/18)  IMPRESSION: Mild irregularity of the distal right ureter just above the UVJ on delayed imaging. However, there is no associated contrast extravasation, fistulous communication, or residual hydronephrosis. No intravaginal contrast on delayed imaging. Bladder is within normal limits on CT.   Past Medical History:  Diagnosis Date  . Abdominal lipoma 05/2015  . Abdominal pain, RLQ 10/03/2017  . Arthritis    lower back  . Diabetes mellitus without complication ([place])    type 2  . DM type 2 with diabetic peripheral neuropathy ([facility])    legs and feet  . GERD (gastroesophageal reflux disease)   . Headache    Migraines  . History of MRSA infection ~ 2006   leg  . History of shingles   . Hx of hydronephrosis    following total hysterectomy surgery ; Right kidney ; see CT abdomen 10-02-17 epic; states " a plug had got damaged during the surgery so thats why they put the stent in"   . Hypertension    under control with med., has been on med. x 6 mos.  . Incontinence of urine in female   .  Lumbosacral spondylolysis    see MRI 06-01-17 epic   . Mood swing   . Nocturia   . Optic nerve swelling    seen by Dr Mora Bellman 09-07-17, did lumbar puncture 09-19-17; patient states " they never called me about results ut i have an appt with him in april"; patient also states  " my vision is still blurry and worse when i get the migraines, they think the migraines are causing it"  . Psoriasis     Past Surgical History:  Procedure Laterality Date  . CESAREAN SECTION  06/21/2008; 08/18/2010  . CESAREAN SECTION N/A 07/10/2014   Procedure: REPEAT CESAREAN SECTION;  Surgeon: Daria Pastures, MD;  Location: [facility];  Service: Obstetrics;  Laterality: N/A;  . CHOLECYSTECTOMY  07/10/2008  . CYSTOSCOPY N/A 09/28/2017   Procedure: CYSTOSCOPY;  Surgeon: Bobbye Charleston, MD;  Location: [facility];  Service: Gynecology;  Laterality: N/A;  . CYSTOSCOPY W/ URETERAL STENT PLACEMENT Right 10/03/2017   Procedure: CYSTOSCOPY WITH RETROGRADE PYELOGRAM/URETERAL STENT PLACEMENT;  Surgeon: Ceasar Mons, MD;  Location: [facility];  Service: Urology;  Laterality: Right;  . LIPOMA EXCISION  2014   back  . LIPOMA EXCISION N/A 05/21/2015   Procedure: EXCISION LIPOMAS ON ABDOMEN;  Surgeon: Erroll Luna, MD;  Location: [facility];  Service: General;  Laterality: N/A;  . LUMBAR PUNCTURE  09/19/17   [facility], Dr. Curt Bears  . OOPHORECTOMY Left 09/28/2017  Procedure: OOPHORECTOMY;  Surgeon: Bobbye Charleston, MD;  Location: [facility];  Service: Gynecology;  Laterality: Left;  . ROBOTIC ASSISTED LAPAROSCOPIC LYSIS OF ADHESION N/A 09/28/2017   Procedure: ROBOTIC ASSISTED LAPAROSCOPIC LYSIS OF ADHESION;  Surgeon: Bobbye Charleston, MD;  Location: [facility];  Service: Gynecology;  Laterality: N/A;  . ROBOTIC ASSISTED TOTAL HYSTERECTOMY WITH BILATERAL SALPINGO OOPHERECTOMY Bilateral 09/28/2017   Procedure: ROBOTIC ASSISTED TOTAL HYSTERECTOMY WITH BILATERAL SALPINGECTOMY;  Surgeon: Bobbye Charleston,  MD;  Location: [facility];  Service: Gynecology;  Laterality: Bilateral;  . TUBAL LIGATION    . WISDOM TOOTH EXTRACTION      Allergies:  Allergies  Allergen Reactions  . Bee Venom Anaphylaxis  . Penicillins Anaphylaxis and Other (See Comments)    Has patient had a PCN reaction causing immediate rash, facial/tongue/throat swelling, SOB or lightheadedness with hypotension: Yes Has patient had a PCN reaction causing severe rash involving mucus membranes or skin necrosis: No Has patient had a PCN reaction that required hospitalization: Yes Has patient had a PCN reaction occurring within the last 10 years: No If all of the above answers are "NO", then may proceed with Cephalosporin use.   . Mushroom Extract Complex Nausea And Vomiting  . Powder Itching, Rash and Other (See Comments)    The powder from latex gloves    Family History  Problem Relation Age of Onset  . Drug abuse Mother   . Heart disease Maternal Grandmother   . Heart disease Maternal Grandfather     Social History:  reports that she has been smoking cigarettes.  She started smoking about 3 years ago. She has a 8.00 pack-year smoking history. she has never used smokeless tobacco. She reports that she does not drink alcohol or use drugs.  ROS: A complete review of systems was performed.  All systems are negative except for pertinent findings as noted.  Physical Exam:  Vital signs in last 24 hours: Temp:  [98.6 F (37 C)] 98.6 F (37 C) (03/19 1552) Pulse Rate:  [74] 74 (03/19 1552) BP: (152)/(92) 152/92 (03/19 1552) SpO2:  [99 %] 99 % (03/19 1552) Weight:  [92.1 kg (203 lb)] 92.1 kg (203 lb) (03/19 1552) Constitutional:  Alert and oriented, No acute distress Cardiovascular: Regular rate and rhythm, No JVD Respiratory: Normal respiratory effort, Lungs clear bilaterally GI: Abdomen is soft, nontender, nondistended, no abdominal masses GU: No CVA tenderness Lymphatic: No lymphadenopathy Neurologic: Grossly intact, no  focal deficits Psychiatric: Normal mood and affect  Laboratory Data:  No results for input(s): WBC, HGB, HCT, PLT in the last 72 hours.  No results for input(s): NA, K, CL, GLUCOSE, BUN, CALCIUM, CREATININE in the last 72 hours.  Invalid input(s): CO3   Results for orders placed or performed in visit on 01/17/18 (from the past 24 hour(s))  POCT glycosylated hemoglobin (Hb A1C)     Status: None   Collection Time: 01/17/18  4:20 PM  Result Value Ref Range   Hemoglobin A1C 6.3    No results found for this or any previous visit (from the past 240 hour(s)).  Renal Function: No results for input(s): CREATININE in the last 168 hours. CrCl cannot be calculated (Patient's most recent lab result is older than the maximum 21 days allowed.).  Radiologic Imaging: No results found.  I independently reviewed the above imaging studies.  Assessment and Plan Bowie Doiron is a 37 y.o. 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 CT urogram pending. The risks, benefits and alternatives of cystoscopy with right retrograde pyelogram, cystogram and exam under anesthesia was discussed with the patient. She voices understanding and wishes to proceed. I briefly discussed the potential need for surgical repair of this issue pending the findings on her CT/cystoscopy.   Ellison Hughs, MD 01/18/2018, 7:11 AM  [facility] Pager: [phone]

## 2018-01-18 NOTE — Discharge Instructions (Signed)

## 2018-01-19 ENCOUNTER — Telehealth: Payer: Self-pay | Admitting: Internal Medicine

## 2018-01-19 ENCOUNTER — Encounter: Payer: Self-pay | Admitting: Pharmacist

## 2018-01-19 ENCOUNTER — Encounter (HOSPITAL_BASED_OUTPATIENT_CLINIC_OR_DEPARTMENT_OTHER): Payer: Self-pay | Admitting: Urology

## 2018-01-19 DIAGNOSIS — F17200 Nicotine dependence, unspecified, uncomplicated: Secondary | ICD-10-CM

## 2018-01-19 MED ORDER — VARENICLINE TARTRATE 0.5 MG X 11 & 1 MG X 42 PO MISC: ORAL | 0 refills | Status: DC

## 2018-01-19 NOTE — Telephone Encounter (Signed)
Pt called and said her pharmacy didn't get the Rx for Chantix. Please advise

## 2018-01-19 NOTE — Progress Notes (Unsigned)
   S:  Patient arrives with husband, Evett Kassa, for his appointment for tobacco cessation.   Patient arrives for evaluation/assistance with tobacco dependence.  Patient case was discussed with PCP, Dr. Emmaline Life, who saw the patient last on 01/17/2018 and agrees that this would be a good time for her to stop smoking. Patient reports that she is very motivated to stop smoking along with her husband. She is motivated for health reasons and not wanting to smoke around her young children. Patient reports that until recently she has always worked and now that she is not working that boredom plays a large role in how much she smokes per day and why she continues to smoke.   Number of Cigarettes per day 10, splits a pack per day with husband. Brand smoked [place].    Most recent quit attempt last pregnancy. Has been able to quit with each of her pregnancies and also for at least 6 months with each time she has breastfed.  Longest time ever been tobacco free about 1.5 years.  Medications (NRT, bupropion, varenicline) used in prior in past cessation efforts include: NRT patches only.   Rates IMPORTANCE of quitting tobacco on 1-10 scale of 10. Rates CONFIDENCE of quitting tobacco on 1-10 scale of 10.  Most common triggers to use tobacco include: boredom and other smokers in the house.    Motivation to quit: overall health   A/P: moderate Nicotine Dependence of many years years duration in a patient who is excellent candidate for success b/c of her overall motivation and willingness to complete along with her husband.     Initiated varenicline tx with 1 month starter pack. Patient counseled on purpose, proper use, and potential adverse effects, including GI upset, and potential change in mood. Patient instructed to pick a potential quit day about 10 days from today.  Written information provided. Provided information on 1 800-QUIT NOW support program. Will follow up with patient progress at husband's  next appointment. F/U Rx Clinic Visit  Total time in face-to-face counseling 45 minutes.  Patient seen with Hildred Alamin, PharmD Candidate and Jalene Mullet, PharmD, PGY1 Resident and Deirdre Pippins, PGY2 Pharmacy Resident, PharmD, BCPS.

## 2018-01-19 NOTE — Telephone Encounter (Signed)
New Rx for Chantix staring month pack sent to pharmacy.   Plans to quit with husband.    Plan discussed with Dr. Emmaline Life - agreed with plan to start Chantix.

## 2018-01-19 NOTE — Patient Instructions (Signed)
Thank you for coming to see me today. It was great to see you and I am glad that you are trying to stop smoking. This is the BEST thing you can do for your health. You have been successful before, so I know you can do this!    1. I have sent in a prescription for Chantix to your pharmacy. This will be a 1 month starter pack and the medication will be in a punch out card with directions. Take the medication each day with food and follow the directions on the punch out card for the daily doses. 2. When you get to the blue tablets and are tolerating that dose for a few days you can set your quit date.  3. On your quit date you will stop smoking, if you do smoke you will probably notice that you don't get the same effect. 4. It will be important for you to find a way to avoid your triggers for smoking.   I will see you back in 3-4 weeks at the appointment with your husband.   Good luck!

## 2018-01-20 DIAGNOSIS — E785 Hyperlipidemia, unspecified: Secondary | ICD-10-CM | POA: Insufficient documentation

## 2018-01-20 NOTE — Assessment & Plan Note (Signed)
obtain A1c  restart metformin

## 2018-01-20 NOTE — Assessment & Plan Note (Addendum)
  Not controlled, due to patient noncompliance  restart losartan Obtain be met Follow-up in 1 month

## 2018-01-20 NOTE — Assessment & Plan Note (Signed)
Restart statin  

## 2018-01-20 NOTE — Assessment & Plan Note (Addendum)
Gad 7 of 11 High level of anxiety  restart Zoloft-will start at lower dose and increase back up to regular dose over the next month Follow-up in 1 month

## 2018-01-21 ENCOUNTER — Emergency Department (HOSPITAL_COMMUNITY): Payer: Medicaid Other

## 2018-01-21 ENCOUNTER — Encounter (HOSPITAL_COMMUNITY): Payer: Self-pay | Admitting: Emergency Medicine

## 2018-01-21 ENCOUNTER — Emergency Department (HOSPITAL_COMMUNITY)
Admission: EM | Admit: 2018-01-21 | Discharge: 2018-01-21 | Disposition: A | Discharge status: Home / Self Care | Payer: Medicaid Other | Attending: Emergency Medicine | Admitting: Emergency Medicine

## 2018-01-21 DIAGNOSIS — R079 Chest pain, unspecified: Secondary | ICD-10-CM | POA: Diagnosis present

## 2018-01-21 DIAGNOSIS — E1140 Type 2 diabetes mellitus with diabetic neuropathy, unspecified: Secondary | ICD-10-CM | POA: Insufficient documentation

## 2018-01-21 DIAGNOSIS — F17210 Nicotine dependence, cigarettes, uncomplicated: Secondary | ICD-10-CM | POA: Insufficient documentation

## 2018-01-21 DIAGNOSIS — I10 Essential (primary) hypertension: Secondary | ICD-10-CM | POA: Diagnosis not present

## 2018-01-21 DIAGNOSIS — Z7984 Long term (current) use of oral hypoglycemic drugs: Secondary | ICD-10-CM | POA: Insufficient documentation

## 2018-01-21 DIAGNOSIS — R509 Fever, unspecified: Secondary | ICD-10-CM

## 2018-01-21 DIAGNOSIS — R51 Headache: Secondary | ICD-10-CM | POA: Diagnosis not present

## 2018-01-21 DIAGNOSIS — Z79899 Other long term (current) drug therapy: Secondary | ICD-10-CM | POA: Diagnosis not present

## 2018-01-21 DIAGNOSIS — R52 Pain, unspecified: Secondary | ICD-10-CM

## 2018-01-21 DIAGNOSIS — R197 Diarrhea, unspecified: Secondary | ICD-10-CM | POA: Diagnosis not present

## 2018-01-21 LAB — COMPREHENSIVE METABOLIC PANEL
ALT: 15 U/L (ref 14–54)
AST: 16 U/L (ref 15–41)
Albumin: 3.8 g/dL (ref 3.5–5.0)
Alkaline Phosphatase: 64 U/L (ref 38–126)
Anion gap: 11 (ref 5–15)
BUN: 7 mg/dL (ref 6–20)
CO2: 21 mmol/L — ABNORMAL LOW (ref 22–32)
Calcium: 8.7 mg/dL — ABNORMAL LOW (ref 8.9–10.3)
Chloride: 97 mmol/L — ABNORMAL LOW (ref 101–111)
Creatinine, Ser: 0.69 mg/dL (ref 0.44–1.00)
GFR calc Af Amer: >60 mL/min (ref >60)
GFR calc non Af Amer: >60 mL/min (ref >60)
Glucose, Bld: 129 mg/dL — ABNORMAL HIGH (ref 65–99)
Potassium: 3.1 mmol/L — ABNORMAL LOW (ref 3.5–5.1)
Sodium: 129 mmol/L — ABNORMAL LOW (ref 135–145)
Total Bilirubin: 0.9 mg/dL (ref 0.3–1.2)
Total Protein: 7.6 g/dL (ref 6.5–8.1)

## 2018-01-21 LAB — URINALYSIS, ROUTINE W REFLEX MICROSCOPIC
Bilirubin Urine: NEGATIVE
Glucose, UA: NEGATIVE mg/dL
Ketones, ur: NEGATIVE mg/dL
Leukocytes, UA: NEGATIVE
Nitrite: NEGATIVE
Protein, ur: NEGATIVE mg/dL
Specific Gravity, Urine: 1.018 (ref 1.005–1.030)
pH: 6 (ref 5.0–8.0)

## 2018-01-21 LAB — CBC WITH DIFFERENTIAL/PLATELET
Basophils Absolute: 0 10*3/uL (ref 0.0–0.1)
Basophils Relative: 0 %
Eosinophils Absolute: 0 10*3/uL (ref 0.0–0.7)
Eosinophils Relative: 0 %
HCT: 40.4 % (ref 36.0–46.0)
Hemoglobin: 13.6 g/dL (ref 12.0–15.0)
Lymphocytes Relative: 17 %
Lymphs Abs: 2.5 10*3/uL (ref 0.7–4.0)
MCH: 27.2 pg (ref 26.0–34.0)
MCHC: 33.7 g/dL (ref 30.0–36.0)
MCV: 80.8 fL (ref 78.0–100.0)
Monocytes Absolute: 1.6 10*3/uL — ABNORMAL HIGH (ref 0.1–1.0)
Monocytes Relative: 11 %
Neutro Abs: 10.8 10*3/uL — ABNORMAL HIGH (ref 1.7–7.7)
Neutrophils Relative %: 72 %
Platelets: 236 10*3/uL (ref 150–400)
RBC: 5 MIL/uL (ref 3.87–5.11)
RDW: 16.6 % — ABNORMAL HIGH (ref 11.5–15.5)
WBC: 15 10*3/uL — ABNORMAL HIGH (ref 4.0–10.5)

## 2018-01-21 LAB — I-STAT TROPONIN, ED: Troponin i, poc: 0 ng/mL (ref 0.00–0.08)

## 2018-01-21 LAB — D-DIMER, QUANTITATIVE: D-Dimer, Quant: 1.22 ug/mL-FEU — ABNORMAL HIGH (ref 0.00–0.50)

## 2018-01-21 LAB — I-STAT CG4 LACTIC ACID, ED: Lactic Acid, Venous: 1.28 mmol/L (ref 0.5–1.9)

## 2018-01-21 LAB — INFLUENZA PANEL BY PCR (TYPE A & B)
Influenza A By PCR: NEGATIVE
Influenza B By PCR: NEGATIVE

## 2018-01-21 MED ORDER — MORPHINE SULFATE (PF) 4 MG/ML IV SOLN
4.0000 mg | Freq: Once | INTRAVENOUS | Status: AC
  Administered 2018-01-21: 4 mg via INTRAVENOUS
  Filled 2018-01-21: qty 1

## 2018-01-21 MED ORDER — POTASSIUM CHLORIDE CRYS ER 20 MEQ PO TBCR
40.0000 meq | EXTENDED_RELEASE_TABLET | Freq: Once | ORAL | Status: AC
  Administered 2018-01-21: 40 meq via ORAL
  Filled 2018-01-21: qty 2

## 2018-01-21 MED ORDER — KETOROLAC TROMETHAMINE 30 MG/ML IJ SOLN
30.0000 mg | Freq: Once | INTRAMUSCULAR | Status: AC
  Administered 2018-01-21: 30 mg via INTRAVENOUS
  Filled 2018-01-21: qty 1

## 2018-01-21 MED ORDER — ACETAMINOPHEN 325 MG PO TABS
650.0000 mg | ORAL_TABLET | Freq: Once | ORAL | Status: AC | PRN
  Administered 2018-01-21: 650 mg via ORAL
  Filled 2018-01-21: qty 2

## 2018-01-21 MED ORDER — IOPAMIDOL (ISOVUE-370) INJECTION 76%
INTRAVENOUS | Status: AC
  Administered 2018-01-21: 100 mL via INTRAVENOUS
  Filled 2018-01-21: qty 100

## 2018-01-21 NOTE — ED Notes (Signed)
Patient transported to CT 

## 2018-01-21 NOTE — ED Triage Notes (Addendum)
Pt states she had a urinary tract procedure done 3/20 at [facility]. Pt states Friday morning began have diarrhea and body aches, bilateral leg pain, pt states her whole body hurts including her head. Pain 10/10 to head and legs and body. >5 episodes of diarrhea in the last 24 hours. Pt unable to sit still in triage.

## 2018-01-21 NOTE — ED Provider Notes (Signed)
[facility] Provider Note   CSN: [id number] Arrival date & time: 01/21/18  1141     History   Chief Complaint Chief Complaint  Patient presents with  . Diarrhea  . Generalized Body Aches  . Fever    HPI Havilah Topor is a 37 y.o. female.  The history is provided by the patient and medical records. No language interpreter was used.  Diarrhea   Associated symptoms include headaches. Pertinent negatives include no abdominal pain, no vomiting and no cough.  Fever   Associated symptoms include chest pain, diarrhea and headaches. Pertinent negatives include no vomiting, no congestion and no cough.   Ivyrose Hashman Ludlam is a 37 y.o. female  with a PMH of DM2, GERD who presents to the Emergency Department complaining of sharp central chest pain as well as sharp back pain at that same level which began yesterday morning. Associated with generalized body aches, subjective fevers, bilateral lower extremity pain and headaches. She also endorses loose, non bloody stools which began yesterday. She reports 2-3 episodes yesterday as well as today. She did have cystoscopy and ureteroscopy on 3/20. She was given one time dose of cipro with procedure. Not discharged on antibiotics.  She denies any abdominal pain, nausea, vomiting or urinary symptoms.  She did have cystoscopy and ureteroscopy 3 days ago.  No stents were placed.  She has follow-up in a few days to discuss further urologic care.  She has been taking Tylenol and ibuprofen with little improvement in her symptoms.  No alleviating or aggravating factors noted.   Past Medical History:  Diagnosis Date  . Abdominal lipoma 05/2015  . Abdominal pain, RLQ 10/03/2017  . Anxiety   . Arthritis    lower back  . Diabetes mellitus without complication ([place])    type 2  . DM type 2 with diabetic peripheral neuropathy (HCC)    legs and feet  . GERD (gastroesophageal reflux disease)   . Headache    Migraines  . History of MRSA infection ~ 2006   leg  . History of shingles   . Hx of hydronephrosis    following total hysterectomy surgery ; Right kidney ; see CT abdomen 10-02-17 epic; states " a plug had got damaged during the surgery so thats why they put the stent in"   . Hypertension    under control with med., has been on med. x 6 mos.  . Incontinence of urine in female   . Lumbosacral spondylolysis    see MRI 06-01-17 epic   . Mood swing   . Nocturia   . Optic nerve swelling    seen by Dr Mora Bellman 09-07-17, did lumbar puncture 09-19-17; patient states " they never called me about results ut i have an appt with him in april"; patient also states  " my vision is still blurry and worse when i get the migraines, they think the migraines are causing it"  . Psoriasis     Patient Active Problem List   Diagnosis Date Noted  . Hyperlipidemia 01/20/2018  . Tobacco use disorder 01/19/2018  . Anxiety state 01/17/2018  . Abdominal pain, RLQ 10/03/2017  . Postoperative state 09/28/2017  . Diabetic neuropathy ([place]) 06/21/2017  . Anemia 06/20/2017  . Diabetes type 2, controlled ([place]) 05/16/2017  . H/O: C-section 05/16/2017  . Vitamin B12 deficiency 05/16/2017  . Health care maintenance 10/18/2016  . Psoriasis 10/01/2013  . Hypertension 09/26/2012  . Obesity 09/26/2012  . Subcutaneous nodule 09/26/2012  .  History of abnormal Pap smear 09/26/2012    Past Surgical History:  Procedure Laterality Date  . CESAREAN SECTION  06/21/2008; 08/18/2010  . CESAREAN SECTION N/A 07/10/2014   Procedure: REPEAT CESAREAN SECTION;  Surgeon: Daria Pastures, MD;  Location: [facility];  Service: Obstetrics;  Laterality: N/A;  . CHOLECYSTECTOMY  07/10/2008  . CYSTOSCOPY N/A 09/28/2017   Procedure: CYSTOSCOPY;  Surgeon: Bobbye Charleston, MD;  Location: [facility];  Service: Gynecology;  Laterality: N/A;  . CYSTOSCOPY Right 01/18/2018   Procedure: CYSTOSCOPY, RETROGRADE / PYELOGRAM, URETEROSCOPY;  Surgeon: Ceasar Mons, MD;  Location: [facility];  Service: Urology;  Laterality: Right;  . CYSTOSCOPY W/ URETERAL STENT PLACEMENT Right 10/03/2017   Procedure: CYSTOSCOPY WITH RETROGRADE PYELOGRAM/URETERAL STENT PLACEMENT;  Surgeon: Ceasar Mons, MD;  Location: [facility];  Service: Urology;  Laterality: Right;  . LIPOMA EXCISION  2014   back  . LIPOMA EXCISION N/A 05/21/2015   Procedure: EXCISION LIPOMAS ON ABDOMEN;  Surgeon: Erroll Luna, MD;  Location: [facility];  Service: General;  Laterality: N/A;  . LUMBAR PUNCTURE  09/19/17   [facility], Dr. Curt Bears  . OOPHORECTOMY Left 09/28/2017   Procedure: OOPHORECTOMY;  Surgeon: Bobbye Charleston, MD;  Location: [facility];  Service: Gynecology;  Laterality: Left;  . ROBOTIC ASSISTED LAPAROSCOPIC LYSIS OF ADHESION N/A 09/28/2017   Procedure: ROBOTIC ASSISTED LAPAROSCOPIC LYSIS OF ADHESION;  Surgeon: Bobbye Charleston, MD;  Location: [facility];  Service: Gynecology;  Laterality: N/A;  . ROBOTIC ASSISTED TOTAL HYSTERECTOMY WITH BILATERAL SALPINGO OOPHERECTOMY Bilateral 09/28/2017   Procedure: ROBOTIC ASSISTED TOTAL HYSTERECTOMY WITH BILATERAL SALPINGECTOMY;  Surgeon: Bobbye Charleston, MD;  Location: [facility];  Service: Gynecology;  Laterality: Bilateral;  . TUBAL LIGATION    . WISDOM TOOTH EXTRACTION       OB History    Gravida  8   Para  3   Term  3   Preterm      AB  5   Living  3     SAB  5   TAB      Ectopic      Multiple      Live Births  3            Home Medications    Prior to Admission medications   Medication Sig Start Date End Date Taking? Authorizing Provider  acetaminophen (TYLENOL) 500 MG tablet Take 1,000 mg by mouth every 6 (six) hours as needed for fever or headache (pain).   Yes [provider]  losartan (COZAAR) 25 MG tablet Take 1 tablet (25 mg total) by mouth daily. 01/17/18  Yes Mikell, Jeani Sow, MD  metFORMIN (GLUCOPHAGE) 500 MG tablet Take 1  tablet (500 mg total) by mouth 2 (two) times daily with a meal. 01/17/18  Yes Mikell, Jeani Sow, MD  OVER THE COUNTER MEDICATION Apply 1 application topically 3 (three) times daily as needed (psoraisis). OTC psoriasis cream   Yes [provider]  rosuvastatin (CRESTOR) 20 MG tablet Take 1 tablet (20 mg total) by mouth daily. 01/17/18  Yes Mikell, Jeani Sow, MD  sertraline (ZOLOFT) 100 MG tablet Take 1 tablet (100 mg total) by mouth daily. 01/17/18  Yes Mikell, Jeani Sow, MD  ketorolac (TORADOL) 10 MG tablet Take 1 tablet (10 mg total) by mouth every 6 (six) hours as needed. Patient not taking: Reported on 01/21/2018 01/18/18   Ceasar Mons, MD  ondansetron (ZOFRAN) 4 MG tablet Take 1 tablet (4 mg total) by  mouth daily as needed for nausea or vomiting. Patient not taking: Reported on 01/21/2018 01/18/18 01/18/19  Ceasar Mons, MD  phenazopyridine (PYRIDIUM) 200 MG tablet Take 1 tablet (200 mg total) by mouth 3 (three) times daily as needed for pain. Patient not taking: Reported on 01/21/2018 01/18/18 01/18/19  Ceasar Mons, MD  varenicline (CHANTIX STARTING MONTH PAK) 0.5 MG X 11 & 1 MG X 42 tablet Take 0.5 mg tablet daily for 3 days, then 0.5 mg tablet twice daily for 4 days, then 1 mg tablet twice daily. 01/19/18   Zenia Resides, MD    Family History Family History  Problem Relation Age of Onset  . Drug abuse Mother   . Heart disease Maternal Grandmother   . Heart disease Maternal Grandfather     Social History Social History   Tobacco Use  . Smoking status: Current Some Day Smoker    Packs/day: 0.50    Years: 16.00    Pack years: 8.00    Types: Cigarettes    Start date: 05/01/2014  . Smokeless tobacco: Never Used  Substance Use Topics  . Alcohol use: No    Alcohol/week: 0.0 oz  . Drug use: No     Allergies   Bee venom; Penicillins; Mushroom extract complex; Ciprofloxacin; and Powder   Review of Systems Review of Systems    Constitutional: Positive for fever.  HENT: Negative for congestion.   Respiratory: Negative for cough and shortness of breath.   Cardiovascular: Positive for chest pain. Negative for palpitations and leg swelling.  Gastrointestinal: Positive for diarrhea. Negative for abdominal pain, constipation, nausea and vomiting.  Genitourinary: Negative for dysuria, frequency and urgency.  Musculoskeletal: Positive for back pain. Negative for neck pain.  Skin: Negative for rash.  Neurological: Positive for headaches. Negative for dizziness, syncope, weakness and numbness.  All other systems reviewed and are negative.    Physical Exam Updated Vital Signs BP 113/73   Pulse 75   Temp 98.5 F (36.9 C) (Oral)   Resp (!) 21   Ht 5\' 6"  (1.676 m)   Wt 91.6 kg (202 lb)   LMP 09/11/2017 (Approximate)   SpO2 95%   BMI 32.60 kg/m   Physical Exam  Constitutional: She is oriented to person, place, and time. She appears well-developed and well-nourished. No distress.  HENT:  Head: Normocephalic and atraumatic.  Neck:  No meningeal signs.  Cardiovascular: Normal rate, regular rhythm and normal heart sounds.  No murmur heard. Pulmonary/Chest: Effort normal and breath sounds normal. No respiratory distress.  Abdominal: Soft. Bowel sounds are normal. She exhibits no distension.  No abdominal tenderness.  Musculoskeletal:       Arms: Bilateral lower extremities with full ROM and 5/5 strength. 2+ DP. Sensation equal and intact. No midline C/T/L spine tenderness.  Bilateral paraspinal thoracic tenderness as depicted in image.  Neurological: She is alert and oriented to person, place, and time.  Speech clear and goal oriented. CN 2-12 grossly intact. Normal finger-to-nose and rapid alternating movements. No drift. Strength and sensation intact.  Skin: Skin is warm and dry.  Nursing note and vitals reviewed.    ED Treatments / Results  Labs (all labs ordered are listed, but only abnormal results are  displayed) Labs Reviewed  COMPREHENSIVE METABOLIC PANEL - Abnormal; Notable for the following components:      Result Value   Sodium 129 (*)    Potassium 3.1 (*)    Chloride 97 (*)    CO2 21 (*)  Glucose, Bld 129 (*)    Calcium 8.7 (*)    All other components within normal limits  CBC WITH DIFFERENTIAL/PLATELET - Abnormal; Notable for the following components:   WBC 15.0 (*)    RDW 16.6 (*)    Neutro Abs 10.8 (*)    Monocytes Absolute 1.6 (*)    All other components within normal limits  URINALYSIS, ROUTINE W REFLEX MICROSCOPIC - Abnormal; Notable for the following components:   Hgb urine dipstick MODERATE (*)    Bacteria, UA RARE (*)    Squamous Epithelial / LPF 0-5 (*)    All other components within normal limits  D-DIMER, QUANTITATIVE (NOT AT [facility]) - Abnormal; Notable for the following components:   D-Dimer, Quant 1.22 (*)    All other components within normal limits  C DIFFICILE QUICK SCREEN W PCR REFLEX  INFLUENZA PANEL BY PCR (TYPE A & B)  I-STAT CG4 LACTIC ACID, ED  I-STAT TROPONIN, ED    EKG None ED ECG REPORT   Date: 01/21/2018  Rate: 89  Rhythm: normal sinus rhythm  QRS Axis: normal  Intervals: normal  ST/T Wave abnormalities: normal  Conduction Disutrbances:none  Narrative Interpretation: No acute ischemic changes.  I have personally reviewed the EKG tracing and agree with the computerized printout as noted.   Radiology Dg Chest 2 View  Result Date: 01/21/2018 CLINICAL DATA:  Fever and body aches for 2 days, hypertension, diabetes mellitus, smoker EXAM: CHEST - 2 VIEW COMPARISON:  03/11/2017 FINDINGS: Normal heart size, mediastinal contours, and pulmonary vascularity. Lungs clear. No pleural effusion or pneumothorax. Bones unremarkable. IMPRESSION: Normal exam. Electronically Signed   By: Lavonia Dana M.D.   On: 01/21/2018 12:59   Ct Angio Chest Pe W And/or Wo Contrast  Result Date: 01/21/2018 CLINICAL DATA:  Body aches with diarrhea and leg pain as  well as headache beginning yesterday. Recent urinary tract procedure 01/18/2018. Elevated D-dimer with mid chest pain since yesterday. EXAM: CT ANGIOGRAPHY CHEST WITH CONTRAST TECHNIQUE: Multidetector CT imaging of the chest was performed using the standard protocol during bolus administration of intravenous contrast. Multiplanar CT image reconstructions and MIPs were obtained to evaluate the vascular anatomy. CONTRAST:  64 mL ISOVUE-370 IOPAMIDOL (ISOVUE-370) INJECTION 76% COMPARISON:  CT abdomen 01/03/2018 and 10/02/2017 FINDINGS: Cardiovascular: Heart is normal size. Pulmonary arterial system is normal without evidence of emboli. Remaining vascular structures are unremarkable. Mediastinum/Nodes: No evidence of mediastinal or hilar adenopathy. Remaining mediastinal structures are within normal. Lungs/Pleura: Lungs are adequately inflated without consolidation or effusion. Airways are normal. Upper Abdomen: No acute abnormality. Musculoskeletal: Mild degenerate change of the spine. Review of the MIP images confirms the above findings. IMPRESSION: No evidence of pulmonary embolism. No acute cardiopulmonary disease. Electronically Signed   By: Marin Olp M.D.   On: 01/21/2018 16:58    Procedures Procedures (including critical care time)  Medications Ordered in ED Medications  acetaminophen (TYLENOL) tablet 650 mg (650 mg Oral Given 01/21/18 1229)  morphine 4 MG/ML injection 4 mg (4 mg Intravenous Given 01/21/18 1418)  potassium chloride SA (K-DUR,KLOR-CON) CR tablet 40 mEq (40 mEq Oral Given 01/21/18 1418)  iopamidol (ISOVUE-370) 76 % injection (100 mLs Intravenous Contrast Given 01/21/18 1621)  ketorolac (TORADOL) 30 MG/ML injection 30 mg (30 mg Intravenous Given 01/21/18 1610)     Initial Impression / Assessment and Plan / ED Course  I have reviewed the triage vital signs and the nursing notes.  Pertinent labs & imaging results that were available during my care of the  patient were reviewed by me  and considered in my medical decision making (see chart for details).    Meliss Fleek Winograd is a 37 y.o. female who presents to ED for a constellation of symptoms including chest and upper back pain, headaches, body aches, subjective fever and diarrhea which began yesterday.  She denies any abdominal pain, nausea, vomiting or urinary symptoms.  She did have a cystoscopy and ureteroscopy on 3/20.  Or materials were placed during procedure.  She has no tenderness on abdominal examination.  UA with blood but no signs of infection.  No low back pain.  Unlikely that her symptoms today are related to prior procedure.  Did have a low-grade temperature upon arrival.  Labs reviewed: Leukocytosis of 15.  Hypokalemia which was replenished in ED.  Flu was negative.  Troponin negative.  D-dimer was elevated, therefore proceeded with CT angios for further evaluation of chest pain.  CT angio was negative. Evaluation does not show pathology that would require ongoing emergent intervention or inpatient treatment.  Discussed reassuring workup, symptomatic home care, follow-up care and return precautions.  All questions answered.   Patient discussed with Dr. Rex Kras who agrees with treatment plan.    Final Clinical Impressions(s) / ED Diagnoses   Final diagnoses:  Chest pain with low risk for cardiac etiology  Fever, unspecified fever cause  Body aches    ED Discharge Orders    None       , Ozella Almond, PA-C 01/21/18 1842    Little, Wenda Overland, MD 01/24/18 1057

## 2018-01-21 NOTE — Discharge Instructions (Signed)
It was my pleasure taking care of you today!   Fortunately, your labs and imaging studies today were reassuring.   Please follow up with your doctor as directed.   Return to ER for new or worsening symptoms, any additional concerns.

## 2018-01-24 ENCOUNTER — Encounter: Payer: Self-pay | Admitting: Internal Medicine

## 2018-01-26 ENCOUNTER — Other Ambulatory Visit: Payer: Self-pay | Admitting: Urology

## 2018-02-14 ENCOUNTER — Ambulatory Visit: Payer: Medicaid Other | Admitting: Diagnostic Neuroimaging

## 2018-02-14 ENCOUNTER — Telehealth: Payer: Self-pay | Admitting: *Deleted

## 2018-02-14 DIAGNOSIS — Z0289 Encounter for other administrative examinations: Secondary | ICD-10-CM

## 2018-02-14 NOTE — Telephone Encounter (Signed)
Patient arrived at 8:29 am for 9:00 am appointment. She left stating she thought her appointment time was 8:30 am, and she had to take her children to an appointment. [name] explained no show policy to her. She rescheduled her FU.

## 2018-02-15 ENCOUNTER — Encounter: Payer: Self-pay | Admitting: Diagnostic Neuroimaging

## 2018-02-20 ENCOUNTER — Ambulatory Visit (INDEPENDENT_AMBULATORY_CARE_PROVIDER_SITE_OTHER): Payer: Medicaid Other | Admitting: Internal Medicine

## 2018-02-20 ENCOUNTER — Encounter: Payer: Self-pay | Admitting: Internal Medicine

## 2018-02-20 VITALS — BP 140/90 | HR 73 | Temp 98.0°F | Ht 66.0 in | Wt 202.2 lb

## 2018-02-20 DIAGNOSIS — F411 Generalized anxiety disorder: Secondary | ICD-10-CM | POA: Diagnosis not present

## 2018-02-20 DIAGNOSIS — F17200 Nicotine dependence, unspecified, uncomplicated: Secondary | ICD-10-CM

## 2018-02-20 DIAGNOSIS — E119 Type 2 diabetes mellitus without complications: Secondary | ICD-10-CM | POA: Diagnosis present

## 2018-02-20 MED ORDER — METFORMIN HCL ER 500 MG PO TB24: 500.0000 mg | ORAL_TABLET | Freq: Every day | ORAL | 0 refills | Status: DC

## 2018-02-20 MED ORDER — FLUOXETINE HCL 20 MG PO CAPS: 20.0000 mg | ORAL_CAPSULE | Freq: Every day | ORAL | 0 refills | Status: DC

## 2018-02-20 MED ORDER — VARENICLINE TARTRATE 0.5 MG X 11 & 1 MG X 42 PO MISC: ORAL | 0 refills | Status: DC

## 2018-02-20 NOTE — Patient Instructions (Addendum)
Kinnedy Lorin Davies  02/20/2018   Your procedure is scheduled on: 02-22-18   Report to [facility]  Entrance Report to Admitting at 10:15 AM    Call this number if you have problems the morning of surgery [phone]   Remember: Do not eat food or drink liquids :After Midnight.     Take these medicines the morning of surgery with A SIP OF WATER: None-Per pt's preference                              You may not have any metal on your body including hair pins and              piercings  Do not wear jewelry, make-up, lotions, powders or perfumes, deodorant             Do not wear nail polish.  Do not shave  48 hours prior to surgery.                 Do not bring valuables to the hospital. [place].  Contacts, dentures or bridgework may not be worn into surgery.  Leave suitcase in the car. After surgery it may be brought to your room.                 Please read over the following fact sheets you were given: _____________________________________________________________________             How to Manage Your Diabetes Before and After Surgery  Why is it important to control my blood sugar before and after surgery? . Improving blood sugar levels before and after surgery helps healing and can limit problems. . A way of improving blood sugar control is eating a healthy diet by: o  Eating less sugar and carbohydrates o  Increasing activity/exercise o  Talking with your doctor about reaching your blood sugar goals . High blood sugars (greater than 180 mg/dL) can raise your risk of infections and slow your recovery, so you will need to focus on controlling your diabetes during the weeks before surgery. . Make sure that the doctor who takes care of your diabetes knows about your planned surgery including the date and location.  How do I manage my blood sugar before surgery? . Check your blood sugar at least 4  times a day, starting 2 days before surgery, to make sure that the level is not too high or low. o Check your blood sugar the morning of your surgery when you wake up and every 2 hours until you get to the Short Stay unit. . If your blood sugar is less than 70 mg/dL, you will need to treat for low blood sugar: o Do not take insulin. o Treat a low blood sugar (less than 70 mg/dL) with  cup of clear juice (cranberry or apple), 4 glucose tablets, OR glucose gel. o Recheck blood sugar in 15 minutes after treatment (to make sure it is greater than 70 mg/dL). If your blood sugar is not greater than 70 mg/dL on recheck, call [phone] for further instructions. . Report your blood sugar to the short stay nurse when you get to Short Stay.  . If you are admitted to the hospital after surgery: o Your  blood sugar will be checked by the staff and you will probably be given insulin after surgery (instead of oral diabetes medicines) to make sure you have good blood sugar levels. o The goal for blood sugar control after surgery is 80-180 mg/dL.   WHAT DO I DO ABOUT MY DIABETES MEDICATION?  Marland Kitchen Do not take oral diabetes medicines (pills) the morning of surgery.  . THE DAY BEFORE SURGERY, take your usual dose of Metformin        Patient Signature:  Date:   Nurse Signature:  Date:   Reviewed and Endorsed by [facility] Patient Education Committee, August 2015  [facility] - Preparing for Surgery Before surgery, you can play an important role.  Because skin is not sterile, your skin needs to be as free of germs as possible.  You can reduce the number of germs on your skin by washing with CHG (chlorahexidine gluconate) soap before surgery.  CHG is an antiseptic cleaner which kills germs and bonds with the skin to continue killing germs even after washing. Please DO NOT use if you have an allergy to CHG or antibacterial soaps.  If your skin becomes reddened/irritated stop using the CHG and inform your nurse  when you arrive at Short Stay. Do not shave (including legs and underarms) for at least 48 hours prior to the first CHG shower.  You may shave your face/neck. Please follow these instructions carefully:  1.  Shower with CHG Soap the night before surgery and the  morning of Surgery.  2.  If you choose to wash your hair, wash your hair first as usual with your  normal  shampoo.  3.  After you shampoo, rinse your hair and body thoroughly to remove the  shampoo.                           4.  Use CHG as you would any other liquid soap.  You can apply chg directly  to the skin and wash                       Gently with a scrungie or clean washcloth.  5.  Apply the CHG Soap to your body ONLY FROM THE NECK DOWN.   Do not use on face/ open                           Wound or open sores. Avoid contact with eyes, ears mouth and genitals (private parts).                       Wash face,  Genitals (private parts) with your normal soap.             6.  Wash thoroughly, paying special attention to the area where your surgery  will be performed.  7.  Thoroughly rinse your body with warm water from the neck down.  8.  DO NOT shower/wash with your normal soap after using and rinsing off  the CHG Soap.                9.  Pat yourself dry with a clean towel.            10.  Wear clean pajamas.            11.  Place clean sheets on your bed the night of your first  shower and do not  sleep with pets. Day of Surgery : Do not apply any lotions/deodorants the morning of surgery.  Please wear clean clothes to the hospital/surgery center.  FAILURE TO FOLLOW THESE INSTRUCTIONS MAY RESULT IN THE CANCELLATION OF YOUR SURGERY PATIENT SIGNATURE_________________________________  NURSE SIGNATURE__________________________________  ________________________________________________________________________  WHAT IS A BLOOD TRANSFUSION? Blood Transfusion Information  A transfusion is the replacement of blood or some of its  parts. Blood is made up of multiple cells which provide different functions.  Red blood cells carry oxygen and are used for blood loss replacement.  White blood cells fight against infection.  Platelets control bleeding.  Plasma helps clot blood.  Other blood products are available for specialized needs, such as hemophilia or other clotting disorders. BEFORE THE TRANSFUSION  Who gives blood for transfusions?   Healthy volunteers who are fully evaluated to make sure their blood is safe. This is blood bank blood. Transfusion therapy is the safest it has ever been in the practice of medicine. Before blood is taken from a donor, a complete history is taken to make sure that person has no history of diseases nor engages in risky social behavior (examples are intravenous drug use or sexual activity with multiple partners). The donor's travel history is screened to minimize risk of transmitting infections, such as malaria. The donated blood is tested for signs of infectious diseases, such as HIV and hepatitis. The blood is then tested to be sure it is compatible with you in order to minimize the chance of a transfusion reaction. If you or a relative donates blood, this is often done in anticipation of surgery and is not appropriate for emergency situations. It takes many days to process the donated blood. RISKS AND COMPLICATIONS Although transfusion therapy is very safe and saves many lives, the main dangers of transfusion include:   Getting an infectious disease.  Developing a transfusion reaction. This is an allergic reaction to something in the blood you were given. Every precaution is taken to prevent this. The decision to have a blood transfusion has been considered carefully by your caregiver before blood is given. Blood is not given unless the benefits outweigh the risks. AFTER THE TRANSFUSION  Right after receiving a blood transfusion, you will usually feel much better and more energetic.  This is especially true if your red blood cells have gotten low (anemic). The transfusion raises the level of the red blood cells which carry oxygen, and this usually causes an energy increase.  The nurse administering the transfusion will monitor you carefully for complications. HOME CARE INSTRUCTIONS  No special instructions are needed after a transfusion. You may find your energy is better. Speak with your caregiver about any limitations on activity for underlying diseases you may have. SEEK MEDICAL CARE IF:   Your condition is not improving after your transfusion.  You develop redness or irritation at the intravenous (IV) site. SEEK IMMEDIATE MEDICAL CARE IF:  Any of the following symptoms occur over the next 12 hours:  Shaking chills.  You have a temperature by mouth above 102 F (38.9 C), not controlled by medicine.  Chest, back, or muscle pain.  People around you feel you are not acting correctly or are confused.  Shortness of breath or difficulty breathing.  Dizziness and fainting.  You get a rash or develop hives.  You have a decrease in urine output.  Your urine turns a dark color or changes to pink, red, or brown. Any of the following symptoms occur  over the next 10 days:  You have a temperature by mouth above 102 F (38.9 C), not controlled by medicine.  Shortness of breath.  Weakness after normal activity.  The white part of the eye turns yellow (jaundice).  You have a decrease in the amount of urine or are urinating less often.  Your urine turns a dark color or changes to pink, red, or brown. Document Released: 10/15/2000 Document Revised: 01/10/2012 Document Reviewed: 06/03/2008 [facility] Patient Information 2014 [place], [place].  _______________________________________________________________________

## 2018-02-20 NOTE — Assessment & Plan Note (Addendum)
Patient is trying to quit smoking, she is taking Chantix Currently taking 1 mg in the morning 1 mg at night -having symptoms associated with taking the nighttime dose - will have her decrease to 1 mg in the morning -Follow-up in 1 month

## 2018-02-20 NOTE — Assessment & Plan Note (Signed)
Will stop metformin 500 mg twice daily given patient has been having nausea and vomiting associated with it - will change to metformin 500 mg XR -Follow-up in about 1 month

## 2018-02-20 NOTE — Assessment & Plan Note (Signed)
Has not had significant improvement on Zoloft.  Would like to switch to another medication. Recommend patient starts Prozac 20 mg -Follow-up in about 1 months to increase dose

## 2018-02-20 NOTE — Progress Notes (Signed)
   [facility] Kerrin Mo, MD Phone: [phone]   Reason For Visit: Follow up   # Anxiety  Has been taking the Zoloft about 1 month.  She states she has not had much improvement.  She still is having a lot of mood swings.  She still has a lot of anxiety and worries.  She states that this medication seems to help previously when she took it but does not seem to be helping that much currently.  She is interested in trying a different medication. GAD7:14   # CHRONIC DM, Type 2: She states she is restarted taking her metformin however has been having nausea and vomiting associated with it.  She states previously she had not had any of these symptoms. She has been taking it regularly.  No polyuria, polydipsia   #Smoking cessation -Has been doing well with smoking cessation.  She has not been smoking.  She has been taking her Chantix.  She states that the Chantix does well for her in the morning.  However she states that night she often has trouble sleeping with the Chantix.  She has had wild dreams.  She feels like she is talking in her sleep often.  Past Medical History Reviewed problem list.  Medications- reviewed and updated No additions to family history Social history- patient is a  Recently quit smoking  Objective: BP 140/90 (BP Location: Right Arm, Patient Position: Sitting, Cuff Size: Normal)   Pulse 73   Temp 98 F (36.7 C) (Oral)   Ht 5\' 6"  (1.676 m)   Wt 202 lb 3.2 oz (91.7 kg)   LMP 09/11/2017 (Approximate)   SpO2 98%   BMI 32.64 kg/m  Gen: NAD, alert, cooperative with exam Cardio: regular rate and rhythm, S1S2 heard, no murmurs appreciated Pulm: clear to auscultation bilaterally, no wheezes, rhonchi or rales Skin: dry, intact, no rashes or lesions   Assessment/Plan: See problem based a/p  Diabetes type 2, controlled (HCC) Will stop metformin 500 mg twice daily given patient has been having nausea and vomiting associated with it - will  change to metformin 500 mg XR -Follow-up in about 1 month  Anxiety state Has not had significant improvement on Zoloft.  Would like to switch to another medication. Recommend patient starts Prozac 20 mg -Follow-up in about 1 months to increase dose

## 2018-02-20 NOTE — Progress Notes (Signed)
01-21-18 (Epic) SR, and CXR

## 2018-02-20 NOTE — Patient Instructions (Signed)
It was nice seeing you today.  I am going to change your metformin to long-acting dose please take this once daily in the morning to see if that helps with your nausea and vomiting.  For your anxiety we are going to change you to Prozac and  start a reduced dose of 20 mg and then I want to follow-up in about 1 month and we will increase the dose.  Concerning your smoking cessation continue the Chantix morning dose please stop the nighttime dose.  Follow-up in 1 month or sooner.

## 2018-02-21 ENCOUNTER — Encounter (HOSPITAL_COMMUNITY)
Admission: RE | Admit: 2018-02-21 | Discharge: 2018-02-21 | Disposition: A | Discharge status: Home / Self Care | Payer: Medicaid Other | Source: Ambulatory Visit | Attending: Urology | Admitting: Urology

## 2018-02-21 ENCOUNTER — Encounter (HOSPITAL_COMMUNITY): Payer: Self-pay

## 2018-02-21 ENCOUNTER — Other Ambulatory Visit: Payer: Self-pay

## 2018-02-21 LAB — URINALYSIS, COMPLETE (UACMP) WITH MICROSCOPIC
Bacteria, UA: NONE SEEN
Bilirubin Urine: NEGATIVE
Glucose, UA: NEGATIVE mg/dL
Hgb urine dipstick: NEGATIVE
Ketones, ur: NEGATIVE mg/dL
Leukocytes, UA: NEGATIVE
Nitrite: NEGATIVE
Protein, ur: NEGATIVE mg/dL
Specific Gravity, Urine: 1.019 (ref 1.005–1.030)
pH: 5 (ref 5.0–8.0)

## 2018-02-21 LAB — SURGICAL PCR SCREEN
MRSA, PCR: NEGATIVE
Staphylococcus aureus: NEGATIVE

## 2018-02-21 LAB — BASIC METABOLIC PANEL
Anion gap: 10 (ref 5–15)
BUN: 9 mg/dL (ref 6–20)
CO2: 22 mmol/L (ref 22–32)
Calcium: 9 mg/dL (ref 8.9–10.3)
Chloride: 105 mmol/L (ref 101–111)
Creatinine, Ser: 0.68 mg/dL (ref 0.44–1.00)
GFR calc Af Amer: >60 mL/min (ref >60)
GFR calc non Af Amer: >60 mL/min (ref >60)
Glucose, Bld: 100 mg/dL — ABNORMAL HIGH (ref 65–99)
Potassium: 4.1 mmol/L (ref 3.5–5.1)
Sodium: 137 mmol/L (ref 135–145)

## 2018-02-21 LAB — CBC
HCT: 40.6 % (ref 36.0–46.0)
Hemoglobin: 13.6 g/dL (ref 12.0–15.0)
MCH: 27.8 pg (ref 26.0–34.0)
MCHC: 33.5 g/dL (ref 30.0–36.0)
MCV: 82.9 fL (ref 78.0–100.0)
Platelets: 257 10*3/uL (ref 150–400)
RBC: 4.9 MIL/uL (ref 3.87–5.11)
RDW: 15.6 % — ABNORMAL HIGH (ref 11.5–15.5)
WBC: 8.6 10*3/uL (ref 4.0–10.5)

## 2018-02-21 LAB — ABO/RH: ABO/RH(D): B POS

## 2018-02-21 LAB — HEMOGLOBIN A1C
Hgb A1c MFr Bld: 6.5 % — ABNORMAL HIGH (ref 4.8–5.6)
Mean Plasma Glucose: 139.85 mg/dL

## 2018-02-21 LAB — GLUCOSE, CAPILLARY: Glucose-Capillary: 115 mg/dL — ABNORMAL HIGH (ref 65–99)

## 2018-02-21 NOTE — H&P (Addendum)
Urology Preoperative H&P   Chief Complaint: Continuous incontinence  History of Present Illness: [name] is a 37 y.o. female seen in consultation for Dr. Philis Pique after the patient was found to have acute right flank pain and right sided hydronephrosis on CT following a TAH/BSO on 09/28/17. She underwent an uneventful right RPG and stent placement on 10/03/17. She had her stent removed on 11/10/17 and reports constant leakage of urine per her vagina since that time. There was no obvious intravesical pathology noted on her cysto at that time. She is having to wear 4-5 pads/day due to the constant leakage.   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 She is here today to discuss surgical options. She reports stable, but persistent leakage per vagina with minimal change since surgery. She is otherwise voiding normal amounts per her urethra. She denies nausea/vomiting, fever/chills, dysuria, hematuria or flank pain.     Past Medical History:  Diagnosis Date  . Abdominal lipoma 05/2015  . Abdominal pain, RLQ 10/03/2017  . Anxiety   . Arthritis    lower back  . Diabetes mellitus without complication ([place])    type 2  . DM type 2 with diabetic peripheral neuropathy (HCC)    legs and feet  . GERD (gastroesophageal reflux disease)   . Headache    Migraines  . History of MRSA infection ~ 2006   leg  . History of shingles   . Hx of hydronephrosis    following total hysterectomy surgery ; Right kidney ; see CT abdomen 10-02-17 epic; states " a plug had got damaged during the surgery so thats why they put the stent in"   . Hypertension    under control with med., has been on med. x 6 mos.  . Incontinence of urine in female   . Lumbosacral spondylolysis    see MRI 06-01-17 epic   . Mood swing   . Nocturia   . Optic nerve swelling    seen by Dr Mora Bellman 09-07-17, did lumbar puncture 09-19-17;  patient states " they never called me about results ut i have an appt with him in april"; patient also states  " my vision is still blurry and worse when i get the migraines, they think the migraines are causing it"  . Psoriasis     Past Surgical History:  Procedure Laterality Date  . CESAREAN SECTION  06/21/2008; 08/18/2010  . CESAREAN SECTION N/A 07/10/2014   Procedure: REPEAT CESAREAN SECTION;  Surgeon: Daria Pastures, MD;  Location: [facility];  Service: Obstetrics;  Laterality: N/A;  . CHOLECYSTECTOMY  07/10/2008  . CYSTOSCOPY N/A 09/28/2017   Procedure: CYSTOSCOPY;  Surgeon: Bobbye Charleston, MD;  Location: [facility];  Service: Gynecology;  Laterality: N/A;  . CYSTOSCOPY Right 01/18/2018   Procedure: CYSTOSCOPY, RETROGRADE / PYELOGRAM, URETEROSCOPY;  Surgeon: Ceasar Mons, MD;  Location: [facility];  Service: Urology;  Laterality: Right;  . CYSTOSCOPY W/ URETERAL STENT PLACEMENT Right 10/03/2017   Procedure: CYSTOSCOPY WITH RETROGRADE PYELOGRAM/URETERAL STENT PLACEMENT;  Surgeon: Ceasar Mons, MD;  Location: [facility];  Service: Urology;  Laterality: Right;  . LIPOMA EXCISION  2014   back  . LIPOMA EXCISION N/A 05/21/2015   Procedure: EXCISION LIPOMAS ON ABDOMEN;  Surgeon: Erroll Luna, MD;  Location: [facility];  Service: General;  Laterality: N/A;  . LUMBAR PUNCTURE  09/19/17   [facility], Dr. Curt Bears  . OOPHORECTOMY  Left 09/28/2017   Procedure: OOPHORECTOMY;  Surgeon: Bobbye Charleston, MD;  Location: [facility];  Service: Gynecology;  Laterality: Left;  . ROBOTIC ASSISTED LAPAROSCOPIC LYSIS OF ADHESION N/A 09/28/2017   Procedure: ROBOTIC ASSISTED LAPAROSCOPIC LYSIS OF ADHESION;  Surgeon: Bobbye Charleston, MD;  Location: [facility];  Service: Gynecology;  Laterality: N/A;  . ROBOTIC ASSISTED TOTAL HYSTERECTOMY WITH BILATERAL SALPINGO OOPHERECTOMY Bilateral 09/28/2017   Procedure: ROBOTIC ASSISTED TOTAL HYSTERECTOMY WITH BILATERAL  SALPINGECTOMY;  Surgeon: Bobbye Charleston, MD;  Location: [facility];  Service: Gynecology;  Laterality: Bilateral;  . TUBAL LIGATION    . WISDOM TOOTH EXTRACTION      Allergies:  Allergies  Allergen Reactions  . Bee Venom Anaphylaxis  . Penicillins Anaphylaxis and Other (See Comments)    Has patient had a PCN reaction causing immediate rash, facial/tongue/throat swelling, SOB or lightheadedness with hypotension: Yes Has patient had a PCN reaction causing severe rash involving mucus membranes or skin necrosis: No Has patient had a PCN reaction that required hospitalization: Yes Has patient had a PCN reaction occurring within the last 10 years: No If all of the above answers are "NO", then may proceed with Cephalosporin use.   . Mushroom Extract Complex Nausea And Vomiting  . Ciprofloxacin Rash  . Powder Itching, Rash and Other (See Comments)    The powder from latex gloves    Family History  Problem Relation Age of Onset  . Drug abuse Mother   . Heart disease Maternal Grandmother   . Heart disease Maternal Grandfather     Social History:  reports that she has been smoking cigarettes.  She started smoking about 3 years ago. She has a 8.00 pack-year smoking history. She has never used smokeless tobacco. She reports that she does not drink alcohol or use drugs.  ROS: A complete review of systems was performed.  All systems are negative except for pertinent findings as noted.  Physical Exam:  Vital signs in last 24 hours: Temp:  [98.5 F (36.9 C)] 98.5 F (36.9 C) (04/23 1019) Pulse Rate:  [72] 72 (04/23 1019) Resp:  [18] 18 (04/23 1019) BP: (149)/(94) 149/94 (04/23 1019) SpO2:  [98 %] 98 % (04/23 1019) Weight:  [90.3 kg (199 lb)] 90.3 kg (199 lb) (04/23 1019) Constitutional:  Alert and oriented, No acute distress Cardiovascular: Regular rate and rhythm, No JVD Respiratory: Normal respiratory effort, Lungs clear bilaterally GI: Abdomen is soft, nontender, nondistended, no  abdominal masses GU: No CVA tenderness Lymphatic: No lymphadenopathy Neurologic: Grossly intact, no focal deficits Psychiatric: Normal mood and affect  Laboratory Data:  No results for input(s): WBC, HGB, HCT, PLT in the last 72 hours.  No results for input(s): NA, K, CL, GLUCOSE, BUN, CALCIUM, CREATININE in the last 72 hours.  Invalid input(s): CO3   Results for orders placed or performed during the hospital encounter of 02/21/18 (from the past 24 hour(s))  Glucose, capillary     Status: Abnormal   Collection Time: 02/21/18 10:12 AM  Result Value Ref Range   Glucose-Capillary 115 (H) 65 - 99 mg/dL   No results found for this or any previous visit (from the past 240 hour(s)).  Renal Function: No results for input(s): CREATININE in the last 168 hours. CrCl cannot be calculated (Patient's most recent lab result is older than the maximum 21 days allowed.).  Radiologic Imaging: No results found.  I independently reviewed the above imaging studies.  Assessment and Plan [name] is a 37 y.o. female with a right ureterovaginal  fistula  -The risk, benefits and alternatives of cystoscopy with right JJ stent placement and robotic assisted, laparoscopic right ureteral reimplant was discussed with the patient. Risk of robotic right ureteral reimplant include, but are not limited to, bleeding, infection, ureteral stricture disease, urine leak, the need for prolonged right ureteral stent placement, right renal loss, adjacent organ injury, MI, stroke, PE and the inherent risk with general anesthesia. She voices understanding and wishes to proceed    Ellison Hughs, MD 02/21/2018, 10:46 AM  [facility] Pager: [phone]

## 2018-02-22 ENCOUNTER — Ambulatory Visit (HOSPITAL_COMMUNITY): Payer: Medicaid Other | Admitting: Certified Registered Nurse Anesthetist

## 2018-02-22 ENCOUNTER — Encounter (HOSPITAL_COMMUNITY)
Admission: RE | Disposition: A | Discharge status: Home / Self Care | Payer: Self-pay | Source: Ambulatory Visit | Attending: Urology

## 2018-02-22 ENCOUNTER — Encounter (HOSPITAL_COMMUNITY): Payer: Self-pay | Admitting: General Practice

## 2018-02-22 ENCOUNTER — Inpatient Hospital Stay (HOSPITAL_COMMUNITY)
Admission: RE | Admit: 2018-02-22 | Discharge: 2018-02-23 | DRG: 983 | Disposition: A | Discharge status: Home / Self Care | Payer: Medicaid Other | Source: Ambulatory Visit | Attending: Urology | Admitting: Urology

## 2018-02-22 DIAGNOSIS — Z881 Allergy status to other antibiotic agents status: Secondary | ICD-10-CM | POA: Diagnosis not present

## 2018-02-22 DIAGNOSIS — Z8614 Personal history of Methicillin resistant Staphylococcus aureus infection: Secondary | ICD-10-CM | POA: Diagnosis not present

## 2018-02-22 DIAGNOSIS — Z91030 Bee allergy status: Secondary | ICD-10-CM | POA: Diagnosis not present

## 2018-02-22 DIAGNOSIS — Z813 Family history of other psychoactive substance abuse and dependence: Secondary | ICD-10-CM

## 2018-02-22 DIAGNOSIS — F17210 Nicotine dependence, cigarettes, uncomplicated: Secondary | ICD-10-CM | POA: Diagnosis present

## 2018-02-22 DIAGNOSIS — Z8619 Personal history of other infectious and parasitic diseases: Secondary | ICD-10-CM

## 2018-02-22 DIAGNOSIS — Z91018 Allergy to other foods: Secondary | ICD-10-CM

## 2018-02-22 DIAGNOSIS — Z91048 Other nonmedicinal substance allergy status: Secondary | ICD-10-CM

## 2018-02-22 DIAGNOSIS — E1142 Type 2 diabetes mellitus with diabetic polyneuropathy: Secondary | ICD-10-CM | POA: Diagnosis present

## 2018-02-22 DIAGNOSIS — N821 Other female urinary-genital tract fistulae: Principal | ICD-10-CM

## 2018-02-22 DIAGNOSIS — Z8249 Family history of ischemic heart disease and other diseases of the circulatory system: Secondary | ICD-10-CM | POA: Diagnosis not present

## 2018-02-22 DIAGNOSIS — R32 Unspecified urinary incontinence: Secondary | ICD-10-CM | POA: Diagnosis present

## 2018-02-22 DIAGNOSIS — Z880 Allergy status to penicillin: Secondary | ICD-10-CM | POA: Diagnosis not present

## 2018-02-22 DIAGNOSIS — Z23 Encounter for immunization: Secondary | ICD-10-CM | POA: Diagnosis not present

## 2018-02-22 DIAGNOSIS — K219 Gastro-esophageal reflux disease without esophagitis: Secondary | ICD-10-CM | POA: Diagnosis present

## 2018-02-22 DIAGNOSIS — Z90710 Acquired absence of both cervix and uterus: Secondary | ICD-10-CM | POA: Diagnosis not present

## 2018-02-22 DIAGNOSIS — I10 Essential (primary) hypertension: Secondary | ICD-10-CM | POA: Diagnosis present

## 2018-02-22 DIAGNOSIS — Z90721 Acquired absence of ovaries, unilateral: Secondary | ICD-10-CM | POA: Diagnosis not present

## 2018-02-22 HISTORY — PX: CYSTOSCOPY W/ URETERAL STENT PLACEMENT: SHX1429

## 2018-02-22 HISTORY — PX: ROBOTICALLY ASSISTED LAPAROSCOPIC URETERAL RE-IMPLANTATION: SHX6481

## 2018-02-22 LAB — GLUCOSE, CAPILLARY
Glucose-Capillary: 103 mg/dL — ABNORMAL HIGH (ref 65–99)
Glucose-Capillary: 148 mg/dL — ABNORMAL HIGH (ref 65–99)

## 2018-02-22 LAB — HEMOGLOBIN AND HEMATOCRIT, BLOOD
HCT: 41 % (ref 36.0–46.0)
Hemoglobin: 13.4 g/dL (ref 12.0–15.0)

## 2018-02-22 LAB — URINE CULTURE: Culture: NO GROWTH

## 2018-02-22 LAB — TYPE AND SCREEN
ABO/RH(D): B POS
Antibody Screen: NEGATIVE

## 2018-02-22 SURGERY — ROBOTICALLY ASSISTED LAPAROSCOPIC URETERAL RE-IMPLANTATION
Anesthesia: General | Laterality: Right

## 2018-02-22 MED ORDER — MIDAZOLAM HCL 5 MG/5ML IJ SOLN
INTRAMUSCULAR | Status: DC | PRN
  Administered 2018-02-22: 2 mg via INTRAVENOUS

## 2018-02-22 MED ORDER — ROSUVASTATIN CALCIUM 20 MG PO TABS
20.0000 mg | ORAL_TABLET | Freq: Every day | ORAL | Status: DC
  Administered 2018-02-22 – 2018-02-23 (×2): 20 mg via ORAL
  Filled 2018-02-22 (×2): qty 1

## 2018-02-22 MED ORDER — LIDOCAINE 2% (20 MG/ML) 5 ML SYRINGE
INTRAMUSCULAR | Status: DC | PRN
  Administered 2018-02-22: 100 mg via INTRAVENOUS

## 2018-02-22 MED ORDER — DOCUSATE SODIUM 100 MG PO CAPS: 100.0000 mg | ORAL_CAPSULE | Freq: Two times a day (BID) | ORAL | 0 refills | Status: DC

## 2018-02-22 MED ORDER — HYDROCODONE-ACETAMINOPHEN 5-325 MG PO TABS: 1.0000 | ORAL_TABLET | Freq: Four times a day (QID) | ORAL | 0 refills | Status: DC | PRN

## 2018-02-22 MED ORDER — FENTANYL CITRATE (PF) 100 MCG/2ML IJ SOLN
INTRAMUSCULAR | Status: AC
  Filled 2018-02-22: qty 2

## 2018-02-22 MED ORDER — CLINDAMYCIN PHOSPHATE 900 MG/50ML IV SOLN
900.0000 mg | Freq: Once | INTRAVENOUS | Status: AC
  Administered 2018-02-22: 900 mg via INTRAVENOUS
  Filled 2018-02-22: qty 50

## 2018-02-22 MED ORDER — MEPERIDINE HCL 50 MG/ML IJ SOLN: 6.2500 mg | INTRAMUSCULAR | Status: DC | PRN

## 2018-02-22 MED ORDER — FENTANYL CITRATE (PF) 100 MCG/2ML IJ SOLN
INTRAMUSCULAR | Status: DC | PRN
  Administered 2018-02-22: 100 ug via INTRAVENOUS
  Administered 2018-02-22 (×2): 50 ug via INTRAVENOUS
  Administered 2018-02-22: 100 ug via INTRAVENOUS
  Administered 2018-02-22 (×4): 50 ug via INTRAVENOUS

## 2018-02-22 MED ORDER — SUGAMMADEX SODIUM 500 MG/5ML IV SOLN
INTRAVENOUS | Status: AC
  Filled 2018-02-22: qty 5

## 2018-02-22 MED ORDER — SODIUM CHLORIDE 0.9 % IJ SOLN
INTRAMUSCULAR | Status: DC | PRN
  Administered 2018-02-22: 20 mL

## 2018-02-22 MED ORDER — GLYCOPYRROLATE 0.2 MG/ML IV SOSY
PREFILLED_SYRINGE | INTRAVENOUS | Status: DC | PRN
  Administered 2018-02-22: .2 mg via INTRAVENOUS

## 2018-02-22 MED ORDER — LOSARTAN POTASSIUM 25 MG PO TABS
25.0000 mg | ORAL_TABLET | Freq: Every day | ORAL | Status: DC
  Administered 2018-02-22 – 2018-02-23 (×2): 25 mg via ORAL
  Filled 2018-02-22 (×2): qty 1

## 2018-02-22 MED ORDER — ROCURONIUM BROMIDE 10 MG/ML (PF) SYRINGE
PREFILLED_SYRINGE | INTRAVENOUS | Status: AC
  Filled 2018-02-22: qty 5

## 2018-02-22 MED ORDER — METOCLOPRAMIDE HCL 5 MG/ML IJ SOLN: 10.0000 mg | Freq: Once | INTRAMUSCULAR | Status: DC | PRN

## 2018-02-22 MED ORDER — SODIUM CHLORIDE 0.9 % IJ SOLN
INTRAMUSCULAR | Status: AC
  Filled 2018-02-22: qty 20

## 2018-02-22 MED ORDER — SODIUM CHLORIDE 0.9 % IR SOLN
Status: DC | PRN
  Administered 2018-02-22: 1000 mL

## 2018-02-22 MED ORDER — SODIUM CHLORIDE 0.45 % IV SOLN
INTRAVENOUS | Status: DC
  Administered 2018-02-22 – 2018-02-23 (×2): via INTRAVENOUS

## 2018-02-22 MED ORDER — GLYCOPYRROLATE 0.2 MG/ML IV SOSY
PREFILLED_SYRINGE | INTRAVENOUS | Status: AC
  Filled 2018-02-22: qty 5

## 2018-02-22 MED ORDER — BUPIVACAINE LIPOSOME 1.3 % IJ SUSP
20.0000 mL | Freq: Once | INTRAMUSCULAR | Status: AC
  Administered 2018-02-22: 20 mL
  Filled 2018-02-22: qty 20

## 2018-02-22 MED ORDER — OXYCODONE HCL 5 MG PO TABS
5.0000 mg | ORAL_TABLET | ORAL | Status: DC | PRN
  Administered 2018-02-22: 5 mg via ORAL
  Filled 2018-02-22: qty 1

## 2018-02-22 MED ORDER — ONDANSETRON HCL 4 MG/2ML IJ SOLN: 4.0000 mg | INTRAMUSCULAR | Status: DC | PRN

## 2018-02-22 MED ORDER — LACTATED RINGERS IV SOLN
Freq: Once | INTRAVENOUS | Status: AC
  Administered 2018-02-22: 11:00:00 via INTRAVENOUS

## 2018-02-22 MED ORDER — PROPOFOL 10 MG/ML IV BOLUS
INTRAVENOUS | Status: AC
  Filled 2018-02-22: qty 20

## 2018-02-22 MED ORDER — ALBUTEROL SULFATE HFA 108 (90 BASE) MCG/ACT IN AERS
INHALATION_SPRAY | RESPIRATORY_TRACT | Status: DC | PRN
  Administered 2018-02-22: 5 via RESPIRATORY_TRACT

## 2018-02-22 MED ORDER — STERILE WATER FOR IRRIGATION IR SOLN
Status: DC | PRN
  Administered 2018-02-22: 1000 mL

## 2018-02-22 MED ORDER — HYDROMORPHONE HCL 1 MG/ML IJ SOLN: 0.2500 mg | INTRAMUSCULAR | Status: DC | PRN

## 2018-02-22 MED ORDER — FENTANYL CITRATE (PF) 250 MCG/5ML IJ SOLN
INTRAMUSCULAR | Status: AC
  Filled 2018-02-22: qty 5

## 2018-02-22 MED ORDER — ONDANSETRON HCL 4 MG/2ML IJ SOLN
INTRAMUSCULAR | Status: AC
  Filled 2018-02-22: qty 2

## 2018-02-22 MED ORDER — ALBUTEROL SULFATE HFA 108 (90 BASE) MCG/ACT IN AERS
INHALATION_SPRAY | RESPIRATORY_TRACT | Status: AC
  Filled 2018-02-22: qty 6.7

## 2018-02-22 MED ORDER — HYDROMORPHONE HCL 1 MG/ML IJ SOLN
0.5000 mg | INTRAMUSCULAR | Status: DC | PRN
  Administered 2018-02-22: 0.5 mg via INTRAVENOUS
  Administered 2018-02-22 – 2018-02-23 (×4): 1 mg via INTRAVENOUS
  Filled 2018-02-22 (×5): qty 1

## 2018-02-22 MED ORDER — SUCCINYLCHOLINE CHLORIDE 200 MG/10ML IV SOSY
PREFILLED_SYRINGE | INTRAVENOUS | Status: AC
  Filled 2018-02-22: qty 10

## 2018-02-22 MED ORDER — BELLADONNA-OPIUM 16.2-30 MG RE SUPP
RECTAL | Status: AC
Start: 2018-02-22 — End: ?
  Filled 2018-02-22: qty 1

## 2018-02-22 MED ORDER — LIDOCAINE 2% (20 MG/ML) 5 ML SYRINGE
INTRAMUSCULAR | Status: AC
  Filled 2018-02-22: qty 5

## 2018-02-22 MED ORDER — MIDAZOLAM HCL 2 MG/2ML IJ SOLN
INTRAMUSCULAR | Status: AC
  Filled 2018-02-22: qty 2

## 2018-02-22 MED ORDER — OXYBUTYNIN CHLORIDE 5 MG PO TABS: 5.0000 mg | ORAL_TABLET | Freq: Three times a day (TID) | ORAL | Status: DC | PRN

## 2018-02-22 MED ORDER — DIPHENHYDRAMINE HCL 50 MG/ML IJ SOLN: 12.5000 mg | Freq: Four times a day (QID) | INTRAMUSCULAR | Status: DC | PRN

## 2018-02-22 MED ORDER — PROPOFOL 10 MG/ML IV BOLUS
INTRAVENOUS | Status: DC | PRN
  Administered 2018-02-22: 150 mg via INTRAVENOUS

## 2018-02-22 MED ORDER — HYDROMORPHONE HCL 1 MG/ML IJ SOLN
INTRAMUSCULAR | Status: DC
Start: 2018-02-22 — End: 2018-02-22
  Filled 2018-02-22: qty 1

## 2018-02-22 MED ORDER — LACTATED RINGERS IV SOLN
INTRAVENOUS | Status: DC | PRN
  Administered 2018-02-22 (×2): via INTRAVENOUS

## 2018-02-22 MED ORDER — METFORMIN HCL ER 500 MG PO TB24
500.0000 mg | ORAL_TABLET | Freq: Every day | ORAL | Status: DC
  Administered 2018-02-23: 500 mg via ORAL
  Filled 2018-02-22: qty 1

## 2018-02-22 MED ORDER — DEXAMETHASONE SODIUM PHOSPHATE 10 MG/ML IJ SOLN
INTRAMUSCULAR | Status: AC
  Filled 2018-02-22: qty 1

## 2018-02-22 MED ORDER — ACETAMINOPHEN 500 MG PO TABS
1000.0000 mg | ORAL_TABLET | Freq: Four times a day (QID) | ORAL | Status: DC
  Filled 2018-02-22 (×2): qty 2

## 2018-02-22 MED ORDER — VARENICLINE TARTRATE 0.5 MG X 11 & 1 MG X 42 PO MISC: 1.0000 | Freq: Every day | ORAL | Status: DC

## 2018-02-22 MED ORDER — SCOPOLAMINE 1 MG/3DAYS TD PT72
1.0000 | MEDICATED_PATCH | TRANSDERMAL | Status: DC
  Administered 2018-02-22: 1.5 mg via TRANSDERMAL
  Filled 2018-02-22: qty 1

## 2018-02-22 MED ORDER — VARENICLINE TARTRATE 1 MG PO TABS
1.0000 mg | ORAL_TABLET | Freq: Every day | ORAL | Status: DC
  Administered 2018-02-22 – 2018-02-23 (×2): 1 mg via ORAL
  Filled 2018-02-22 (×2): qty 1

## 2018-02-22 MED ORDER — BELLADONNA ALKALOIDS-OPIUM 16.2-60 MG RE SUPP: 1.0000 | Freq: Four times a day (QID) | RECTAL | Status: DC | PRN

## 2018-02-22 MED ORDER — DIPHENHYDRAMINE HCL 12.5 MG/5ML PO ELIX: 12.5000 mg | ORAL_SOLUTION | Freq: Four times a day (QID) | ORAL | Status: DC | PRN

## 2018-02-22 MED ORDER — SUGAMMADEX SODIUM 200 MG/2ML IV SOLN
INTRAVENOUS | Status: DC | PRN
  Administered 2018-02-22 (×2): 200 mg via INTRAVENOUS

## 2018-02-22 MED ORDER — ONDANSETRON HCL 4 MG/2ML IJ SOLN
INTRAMUSCULAR | Status: DC | PRN
  Administered 2018-02-22: 4 mg via INTRAVENOUS

## 2018-02-22 MED ORDER — PNEUMOCOCCAL VAC POLYVALENT 25 MCG/0.5ML IJ INJ
0.5000 mL | INJECTION | INTRAMUSCULAR | Status: AC
  Administered 2018-02-23: 0.5 mL via INTRAMUSCULAR
  Filled 2018-02-22: qty 0.5

## 2018-02-22 MED ORDER — DEXAMETHASONE SODIUM PHOSPHATE 10 MG/ML IJ SOLN
INTRAMUSCULAR | Status: DC | PRN
  Administered 2018-02-22: 5 mg via INTRAVENOUS

## 2018-02-22 MED ORDER — FLUOXETINE HCL 20 MG PO CAPS
20.0000 mg | ORAL_CAPSULE | Freq: Every day | ORAL | Status: DC
  Administered 2018-02-22 – 2018-02-23 (×2): 20 mg via ORAL
  Filled 2018-02-22 (×2): qty 1

## 2018-02-22 MED ORDER — ROCURONIUM BROMIDE 50 MG/5ML IV SOSY
PREFILLED_SYRINGE | INTRAVENOUS | Status: DC | PRN
  Administered 2018-02-22 (×2): 20 mg via INTRAVENOUS
  Administered 2018-02-22: 50 mg via INTRAVENOUS
  Administered 2018-02-22 (×2): 10 mg via INTRAVENOUS
  Administered 2018-02-22: 20 mg via INTRAVENOUS

## 2018-02-22 MED ORDER — INSULIN ASPART 100 UNIT/ML ~~LOC~~ SOLN: 0.0000 [IU] | Freq: Three times a day (TID) | SUBCUTANEOUS | Status: DC

## 2018-02-22 MED ORDER — LACTATED RINGERS IR SOLN
Status: DC | PRN
  Administered 2018-02-22: 1000 mL

## 2018-02-22 SURGICAL SUPPLY — 93 items
ADAPTER GOLDBERG URETERAL (ADAPTER) ×2 IMPLANT
APPLICATOR COTTON TIP 6IN STRL (MISCELLANEOUS) ×2 IMPLANT
APPLICATOR SURGIFLO ENDO (HEMOSTASIS) IMPLANT
BAG URO CATCHER STRL LF (MISCELLANEOUS) IMPLANT
BLADE SURG SZ10 CARB STEEL (BLADE) IMPLANT
CATH FOLEY 2WAY SLVR  5CC 16FR (CATHETERS) ×1
CATH FOLEY 2WAY SLVR 5CC 16FR (CATHETERS) ×1 IMPLANT
CATH FOLEY 3WAY  5CC 18FR (CATHETERS)
CATH FOLEY 3WAY 5CC 18FR (CATHETERS) IMPLANT
CATH URET 5FR 28IN OPEN ENDED (CATHETERS) ×2 IMPLANT
CHLORAPREP W/TINT 26ML (MISCELLANEOUS) ×2 IMPLANT
CLIP VESOLOCK LG 6/CT PURPLE (CLIP) ×4 IMPLANT
CLIP VESOLOCK XL 6/CT (CLIP) ×2 IMPLANT
CLOTH BEACON ORANGE TIMEOUT ST (SAFETY) ×2 IMPLANT
COVER FOOTSWITCH UNIV (MISCELLANEOUS) IMPLANT
COVER SURGICAL LIGHT HANDLE (MISCELLANEOUS) ×4 IMPLANT
COVER TIP SHEARS 8 DVNC (MISCELLANEOUS) ×1 IMPLANT
COVER TIP SHEARS 8MM DA VINCI (MISCELLANEOUS) ×1
DECANTER SPIKE VIAL GLASS SM (MISCELLANEOUS) ×2 IMPLANT
DERMABOND ADVANCED (GAUZE/BANDAGES/DRESSINGS) ×1
DERMABOND ADVANCED .7 DNX12 (GAUZE/BANDAGES/DRESSINGS) ×1 IMPLANT
DRAIN CHANNEL 15F RND FF 3/16 (WOUND CARE) IMPLANT
DRAPE ARM DVNC X/XI (DISPOSABLE) ×4 IMPLANT
DRAPE COLUMN DVNC XI (DISPOSABLE) ×1 IMPLANT
DRAPE DA VINCI XI ARM (DISPOSABLE) ×4
DRAPE DA VINCI XI COLUMN (DISPOSABLE) ×1
DRSG TEGADERM 4X4.75 (GAUZE/BANDAGES/DRESSINGS) ×2 IMPLANT
DRSG TEGADERM 6X8 (GAUZE/BANDAGES/DRESSINGS) ×2 IMPLANT
ELECT REM PT RETURN 15FT ADLT (MISCELLANEOUS) ×2 IMPLANT
GLOVE BIO SURGEON STRL SZ 6.5 (GLOVE) ×2 IMPLANT
GLOVE BIOGEL M STRL SZ7.5 (GLOVE) ×6 IMPLANT
GOWN STRL REUS W/TWL LRG LVL3 (GOWN DISPOSABLE) ×6 IMPLANT
GUIDEWIRE ANG ZIPWIRE 038X150 (WIRE) ×4 IMPLANT
GUIDEWIRE STR DUAL SENSOR (WIRE) IMPLANT
HOLDER FOLEY CATH W/STRAP (MISCELLANEOUS) ×2 IMPLANT
IRRIG SUCT STRYKERFLOW 2 WTIP (MISCELLANEOUS) ×2
IRRIGATION SUCT STRKRFLW 2 WTP (MISCELLANEOUS) ×1 IMPLANT
LOOP VESSEL MAXI BLUE (MISCELLANEOUS) ×2 IMPLANT
MANIFOLD NEPTUNE II (INSTRUMENTS) ×2 IMPLANT
NDL SAFETY ECLIPSE 18X1.5 (NEEDLE) ×1 IMPLANT
NEEDLE HYPO 18GX1.5 SHARP (NEEDLE) ×1
NEEDLE INSUFFLATION 14GA 120MM (NEEDLE) ×2 IMPLANT
PACK CYSTO (CUSTOM PROCEDURE TRAY) ×2 IMPLANT
PACK ROBOT UROLOGY CUSTOM (CUSTOM PROCEDURE TRAY) ×2 IMPLANT
PAD POSITIONING PINK XL (MISCELLANEOUS) ×2 IMPLANT
PLUG CATH AND CAP STER (CATHETERS) ×2 IMPLANT
PORT ACCESS TROCAR AIRSEAL 12 (TROCAR) ×1 IMPLANT
PORT ACCESS TROCAR AIRSEAL 5M (TROCAR) ×1
POUCH SPECIMEN RETRIEVAL 10MM (ENDOMECHANICALS) IMPLANT
SEAL CANN UNIV 5-8 DVNC XI (MISCELLANEOUS) ×4 IMPLANT
SEAL XI 5MM-8MM UNIVERSAL (MISCELLANEOUS) ×4
SET TRI-LUMEN FLTR TB AIRSEAL (TUBING) ×2 IMPLANT
SHEET LAVH (DRAPES) ×2 IMPLANT
SOLUTION ELECTROLUBE (MISCELLANEOUS) ×2 IMPLANT
SPONGE LAP 18X18 RF (DISPOSABLE) IMPLANT
STENT CONTOUR 7FRX24X.038 (STENTS) IMPLANT
STENT URET 6FRX24 CONTOUR (STENTS) ×2 IMPLANT
SURGIFLO W/THROMBIN 8M KIT (HEMOSTASIS) IMPLANT
SUT ETHILON 3 0 PS 1 (SUTURE) ×2 IMPLANT
SUT MNCRL 3 0 VIOLET RB1 (SUTURE) IMPLANT
SUT MNCRL AB 4-0 PS2 18 (SUTURE) ×4 IMPLANT
SUT MON AB 2-0 SH 27 (SUTURE)
SUT MON AB 2-0 SH27 (SUTURE) IMPLANT
SUT MONOCRYL 3 0 RB1 (SUTURE)
SUT PDS AB 1 CTX 36 (SUTURE) IMPLANT
SUT PDS AB 3-0 SH 27 (SUTURE) IMPLANT
SUT PDS AB 4-0 RB1 27 (SUTURE) IMPLANT
SUT PDS AB 4-0 SH 27 (SUTURE) IMPLANT
SUT PROLENE 2 0 SH DA (SUTURE) IMPLANT
SUT SILK 0 (SUTURE)
SUT SILK 0 30XBRD TIE 6 (SUTURE) IMPLANT
SUT SILK 2 0 (SUTURE) ×1
SUT SILK 2 0 SH CR/8 (SUTURE) IMPLANT
SUT SILK 2-0 18XBRD TIE 12 (SUTURE) ×1 IMPLANT
SUT SILK 2-0 30XBRD TIE 12 (SUTURE) IMPLANT
SUT SILK 3 0 (SUTURE)
SUT SILK 3 0 12 30 (SUTURE) IMPLANT
SUT SILK 3-0 18XBRD TIE 12 (SUTURE) IMPLANT
SUT VIC AB 0 CT1 27 (SUTURE) ×1
SUT VIC AB 0 CT1 27XBRD ANTBC (SUTURE) ×1 IMPLANT
SUT VIC AB 2-0 SH 27 (SUTURE)
SUT VIC AB 2-0 SH 27X BRD (SUTURE) IMPLANT
SUT VIC AB 3-0 SH 27 (SUTURE)
SUT VIC AB 3-0 SH 27X BRD (SUTURE) IMPLANT
SUT VIC AB 4-0 RB1 27 (SUTURE) ×4
SUT VIC AB 4-0 RB1 27XBRD (SUTURE) ×4 IMPLANT
SUT VIC AB 4-0 SH 18 (SUTURE) IMPLANT
SUT VICRYL 0 UR6 27IN ABS (SUTURE) ×4 IMPLANT
SYRINGE IRR TOOMEY STRL 70CC (SYRINGE) ×2 IMPLANT
TROCAR XCEL 12X100 BLDLESS (ENDOMECHANICALS) IMPLANT
TUBING CONNECTING 10 (TUBING) IMPLANT
URINEMETER 200ML W/220 (MISCELLANEOUS) IMPLANT
YANKAUER SUCT BULB TIP 10FT TU (MISCELLANEOUS) IMPLANT

## 2018-02-22 NOTE — Transfer of Care (Signed)
Immediate Anesthesia Transfer of Care Note  Patient: Ayano Douthitt Troup  Procedure(s) Performed: Procedure(s) with comments: ROBOTICALLY ASSISTED LAPAROSCOPIC URETERAL RE-IMPLANTATION (Right) - ONLY NEEDS 240 MIN TOTAL FOR ALL PROCEDURES CYSTOSCOPY WITH URETERAL STENT PLACEMENT (Right)  Patient Location: PACU  Anesthesia Type:General  Level of Consciousness: Patient easily awoken, sedated, comfortable, cooperative, following commands, responds to stimulation.   Airway & Oxygen Therapy: Patient spontaneously breathing, ventilating well, oxygen via simple oxygen mask.  Post-op Assessment: Report given to PACU RN, vital signs reviewed and stable, moving all extremities.   Post vital signs: Reviewed and stable.  Complications: No apparent anesthesia complications  Last Vitals:  Vitals Value Taken Time  BP 131/76 02/22/2018  4:36 PM  Temp    Pulse 79 02/22/2018  4:42 PM  Resp 17 02/22/2018  4:42 PM  SpO2 99 % 02/22/2018  4:42 PM  Vitals shown include unvalidated device data.  Last Pain:  Vitals:   02/22/18 1636  TempSrc:   PainSc: (P) Asleep         Complications: No apparent anesthesia complications

## 2018-02-22 NOTE — Discharge Instructions (Signed)

## 2018-02-22 NOTE — Anesthesia Procedure Notes (Signed)
Procedure Name: Intubation Date/Time: 02/22/2018 1:01 PM Performed by: Maxwell Caul, CRNA Pre-anesthesia Checklist: Patient identified, Emergency Drugs available, Suction available and Patient being monitored Patient Re-evaluated:Patient Re-evaluated prior to induction Oxygen Delivery Method: Circle system utilized Preoxygenation: Pre-oxygenation with 100% oxygen Induction Type: IV induction Ventilation: Mask ventilation without difficulty Laryngoscope Size: Mac and 4 Grade View: Grade I Tube type: Oral Number of attempts: 1 Airway Equipment and Method: Stylet and Oral airway Placement Confirmation: ETT inserted through vocal cords under direct vision,  positive ETCO2 and breath sounds checked- equal and bilateral Secured at: 21 cm Tube secured with: Tape Dental Injury: Teeth and Oropharynx as per pre-operative assessment

## 2018-02-22 NOTE — Op Note (Signed)
Operative Note  Preoperative diagnosis:  1.  Right ureterovaginal fistula 2.  Continuous Urinary Incontinence  Postoperative diagnosis: 1.  Right ureterovaginal fistula 2.  Continuous Urinary Incontinence  Procedure(s): 1.  Robotic-assisted laparoscopic right ureteral reimplant  2.  Cystoscopy with right ureteral catheter placement  Surgeon: Ellison Hughs, MD  Assistants:  Debbrah Alar, [facility] An assistant was required for this surgical procedure.  The duties of the assistant included but were not limited to suctioning, passing suture, camera manipulation, retraction.  This procedure would not be able to be performed without an Environmental consultant.   Anesthesia:  General Endotracheal  Complications:  None  EBL:  50 mL   Specimens: 1. Distal right ureter   Drains/Catheters: 1.  Right 6 F x 24 cm JJ stent w/o tether 2.  16 F Foley 3.  LLQ JP drain  Intraoperative findings:   1. Right distal ureterovaginal fistula 2. Watertight vesicoureteral anastomosis   Indication:  [name] is a 37 y.o. female seen in consultation for Dr. Philis Pique after the patient was found to have acute right flank pain and right sided hydronephrosis on CT following a TAH, left oophorectomy and bilateral salpingectomy on 09/28/17. She underwent an uneventful right RPG and stent placement on 10/03/17. She had her stent removed on 11/10/17 and reports constant leakage of urine per her vagina since that time. There was no obvious intravesical pathology noted on her cysto at that time. She is having to wear 4-5 pads/day due to the constant leakage.   She underwent cystoscopy/vaginoscopy with right ureteroscopy on 01/18/18 which confirmed a distal right ureterovaginal fistula. She is here today to discuss surgical options. She reports stable, but persistent leakage per vagina with minimal change since surgery. She is otherwise voiding normal amounts per her urethra. She denies nausea/vomiting, fever/chills,  dysuria, hematuria or flank pain.   Description of procedure:  After informed consent was obtained and signed, the patient was brought back to the operating room and placed on the operating room table in the supine position. General endotracheal anesthesia was induced.  The patient appropriately padded and was converted to lithotomy position. Her abdomen, genitals, and perineum were widely prepped and draped into a sterile field.  A timeout was performed. A 23 French rigid cystoscope was then inserted into the urethral meatus and advanced into the bladder under direct vision. A complete bladder survey revealed no intravesical pathology.  A Glidewire was then used to intubate the right ureteral orifice and was advanced up to the right renal pelvis.  A 5 French open-ended ureteral catheter was then advanced over the wire and into position within the right collecting system.  A 16 French Foley catheter was then placed sterilely with drainage of clear urine observed.  The ureteral catheter and Foley catheter was then secured to another with a 2-0 silk suture.  A suprapubic 8 mm horizontal incision was made. The Veress needle was then inserted into the peritoneal cavity.  Saline drop test of the Veress needle revealed no signs of obstruction and aspiration of the needle revealed no blood or sucus.  The abdomen was then insufflated with CO2 gas to a maximum pressure of 15 mmHg. The Veress needle was removed, and a 8 mm trocar was placed atraumatically.  The robotic camera was used to inspect the sight of entry into the abdominal cavity and revealed no signs of adjacent organ or vessel injury.  Under direct laparoscopic vision, three additional 8 mm trochars and one 12 mm trocar was placed  at or below the level of the umbilicus, in a standard sawtooth configuration. The patient was then placed in steep Trendelenburg position and the robot was docked.  The patient's left colon was moderately adherent to the lateral  sidewall. This was taken down with a combination of sharp and blunt dissection.  The right ureter was identified as it crossed over the right iliac vessels.  A combination of blunt dissection and electrocautery was then used to free the right ureter until it intersected with the vaginal cuff.  There were dense adhesions where the ureterovaginal fistula had formed.  We were able to dissect just distal to the area of fistulization to the vaginal cuff and the ureter was clipped with a Hemoclip and then incised with scissors.  The space of Retzius was then developed in the perivesical tissue overlying the right upper bladder dome was cleared until the detrusor musculature and bladder mucosa was identified.  The right ureter was then debrided back to healthy-appearing tissue and then widely spatulated on the posterior side.  An incision was then made into the right upper bladder dome entering the bladder mucosa.  The right ureter was then reimplanted into the bladder dome, placing a 6 [place] by 24 cm JJ stent during the anastomosis.  Testing the anastomosis proved to be watertight with approximately 250 mL.  The overlying detrusor musculature and perivesical fat was then closed over the anastomosis.  A JP drain was then placed through the left lateral incision and down into the pelvis and secured in place.  The JP drain was inset the bulb suction.  The 12 mm port was then closed using a 0 Vicryl suture.  The skin was then approximated using running 4-0 Monocryl and dressed with Dermabond.  The patient tolerated the procedure well and was transferred to the postanesthesia unit in stable condition.  Plan: Monitor on the floor overnight.  Remove JP drain tomorrow.  She will need her Foley catheter for 2 weeks and her right JJ stent for 6 weeks

## 2018-02-22 NOTE — Progress Notes (Signed)
DC    A1C  Per Dr Royce Macadamia.

## 2018-02-22 NOTE — Anesthesia Preprocedure Evaluation (Addendum)
Anesthesia Evaluation  Patient identified by MRN, date of birth, ID band Patient awake    Reviewed: Allergy & Precautions, NPO status , Patient's Chart, lab work & pertinent test results  Airway Mallampati: II  TM Distance: >3 FB Neck ROM: Full    Dental no notable dental hx. (+) Teeth Intact   Pulmonary Current Smoker,    breath sounds clear to auscultation       Cardiovascular hypertension, Pt. on medications Normal cardiovascular exam Rhythm:Regular Rate:Normal     Neuro/Psych  Headaches, PSYCHIATRIC DISORDERS Anxiety Diabetic peripheral neuropathy  Neuromuscular disease    GI/Hepatic Neg liver ROS, GERD  Medicated and Controlled,  Endo/Other  diabetes, Well Controlled, Type 2, Oral Hypoglycemic AgentsObesity Hyperlipidemia  Renal/GU negative Renal ROS   Ureterovaginal fistula    Musculoskeletal  (+) Arthritis , Osteoarthritis,    Abdominal (+) + obese,   Peds  Hematology  (+) anemia ,   Anesthesia Other Findings   Reproductive/Obstetrics                            Anesthesia Physical Anesthesia Plan  ASA: II  Anesthesia Plan: General   Post-op Pain Management:    Induction: Intravenous  PONV Risk Score and Plan: 4 or greater and Scopolamine patch - Pre-op, Midazolam, Dexamethasone, Ondansetron and Treatment may vary due to age or medical condition  Airway Management Planned: Oral ETT  Additional Equipment:   Intra-op Plan:   Post-operative Plan: Extubation in OR  Informed Consent: I have reviewed the patients History and Physical, chart, labs and discussed the procedure including the risks, benefits and alternatives for the proposed anesthesia with the patient or authorized representative who has indicated his/her understanding and acceptance.   Dental advisory given  Plan Discussed with: CRNA, Anesthesiologist and Surgeon  Anesthesia Plan Comments:          Anesthesia Quick Evaluation

## 2018-02-22 NOTE — Anesthesia Postprocedure Evaluation (Signed)
Anesthesia Post Note  Patient: [name]  Procedure(s) Performed: ROBOTICALLY ASSISTED LAPAROSCOPIC URETERAL RE-IMPLANTATION (Right ) CYSTOSCOPY WITH URETERAL STENT PLACEMENT (Right )     Patient location during evaluation: PACU Anesthesia Type: General Level of consciousness: awake and alert Pain management: pain level controlled Vital Signs Assessment: post-procedure vital signs reviewed and stable Respiratory status: spontaneous breathing, nonlabored ventilation, respiratory function stable and patient connected to nasal cannula oxygen Cardiovascular status: blood pressure returned to baseline and stable Postop Assessment: no apparent nausea or vomiting Anesthetic complications: no    Last Vitals:  Vitals:   02/22/18 1730 02/22/18 1745  BP: (!) 145/83 (!) 151/85  Pulse: 68 71  Resp: 19 20  Temp:  36.9 C  SpO2: 98% 98%    Last Pain:  Vitals:   02/22/18 1745  TempSrc:   PainSc: Asleep                 Tahjae Durr A.

## 2018-02-23 ENCOUNTER — Encounter (HOSPITAL_COMMUNITY): Payer: Self-pay | Admitting: Urology

## 2018-02-23 LAB — BASIC METABOLIC PANEL
Anion gap: 11 (ref 5–15)
BUN: 10 mg/dL (ref 6–20)
CO2: 25 mmol/L (ref 22–32)
Calcium: 8.7 mg/dL — ABNORMAL LOW (ref 8.9–10.3)
Chloride: 98 mmol/L — ABNORMAL LOW (ref 101–111)
Creatinine, Ser: 0.63 mg/dL (ref 0.44–1.00)
GFR calc Af Amer: >60 mL/min (ref >60)
GFR calc non Af Amer: >60 mL/min (ref >60)
Glucose, Bld: 150 mg/dL — ABNORMAL HIGH (ref 65–99)
Potassium: 4.4 mmol/L (ref 3.5–5.1)
Sodium: 134 mmol/L — ABNORMAL LOW (ref 135–145)

## 2018-02-23 LAB — HEMOGLOBIN AND HEMATOCRIT, BLOOD
HCT: 40.4 % (ref 36.0–46.0)
Hemoglobin: 13.4 g/dL (ref 12.0–15.0)

## 2018-02-23 LAB — GLUCOSE, CAPILLARY: Glucose-Capillary: 119 mg/dL — ABNORMAL HIGH (ref 65–99)

## 2018-02-23 MED ORDER — OXYBUTYNIN CHLORIDE ER 10 MG PO TB24: 10.0000 mg | ORAL_TABLET | Freq: Every day | ORAL | 1 refills | Status: AC

## 2018-02-23 NOTE — Progress Notes (Signed)
Patient encouraged to ambulate the hallway. Patient refused. Patient educated on the importance of ambulating after surgery. Will attempt to ambulate patient at a later time.

## 2018-02-23 NOTE — Progress Notes (Signed)
Discharge instructions given and explained to patient, reviewed and demonstrated foley/leg bag care/management at home, patient verbalized understanding, surgical incision clean/dry/intact, no sign of infection noted. Patient waiting on transportation for home.

## 2018-02-23 NOTE — Progress Notes (Signed)
Patient alert/oriented, in no distress. Patient accompanied home by mother, transported to the car by staff via wheelchair.

## 2018-02-23 NOTE — Progress Notes (Signed)
1 Day Post-Op Subjective: C/o abdominal pain this AM.  Has not ambulated yet 2/2 to pain.  Denies N/V.  Tolerating liquids.  Denies leakage per vagina.    Objective: Vital signs in last 24 hours: Temp:  [97.9 F (36.6 C)-98.5 F (36.9 C)] 97.9 F (36.6 C) (04/25 0512) Pulse Rate:  [57-80] 57 (04/25 0512) Resp:  [16-20] 18 (04/25 0512) BP: (131-151)/(76-93) 151/81 (04/25 0512) SpO2:  [97 %-100 %] 99 % (04/25 0512) Weight:  [90.3 kg (199 lb)-91.3 kg (201 lb 4.5 oz)] 91.3 kg (201 lb 4.5 oz) (04/24 1806)  Intake/Output from previous day: 04/24 0701 - 04/25 0700 In: 1300 [I.V.:1300] Out: 1525 [Urine:1425; Drains:50; Blood:50]  Intake/Output this shift: No intake/output data recorded.  Physical Exam:  General: Alert and oriented CV: RRR, palpable distal pulses Lungs: CTAB, equal chest rise Abdomen: Soft, NTND, no rebound or guarding Incisions: c/d/i Gu: JP w/ serosang output.  Foley draining clear-yellow urine Ext: NT, No erythema  Lab Results: Recent Labs    02/21/18 1100 02/22/18 1647 02/23/18 0416  HGB 13.6 13.4 13.4  HCT 40.6 41.0 40.4   BMET Recent Labs    02/21/18 1130 02/23/18 0416  NA 137 134*  K 4.1 4.4  CL 105 98*  CO2 22 25  GLUCOSE 100* 150*  BUN 9 10  CREATININE 0.68 0.63  CALCIUM 9.0 8.7*     Studies/Results: No results found.  Assessment/Plan: POD 1 s/p right robotic ureteral reimplant  -OOBTC and ambulate -d/c JP drain -ADAT -Home today with Foley.   LOS: 1 day   Ellison Hughs, MD [facility] Pager: [phone]  02/23/2018, 8:44 AM

## 2018-02-27 ENCOUNTER — Telehealth: Payer: Self-pay | Admitting: Pharmacist

## 2018-02-27 MED ORDER — VARENICLINE TARTRATE 0.5 MG PO TABS
0.5000 mg | ORAL_TABLET | Freq: Two times a day (BID) | ORAL | 0 refills | Status: DC
Start: 2018-02-27 — End: 2019-06-06

## 2018-02-27 NOTE — Telephone Encounter (Signed)
Refillled chantix at lower dose of 0.5mg  once or twice daily.

## 2018-02-27 NOTE — Telephone Encounter (Signed)
Pt reports decreased in smoking urges since starting Chantix. Additionally, mentions that since starting Chantix, taste of cigarettes has changed which has contributed to decreased urges. Recommended continued therapy with Chantix, however due to significant ADEs associated with full 1mg  BID dose, changed dose from 1mg  BID to 0.5mg  BID. Sent e-script for 0.5mg  tablets to pharmacy. Set final quit date of May 2019. Counseled to continue Chantix for at least 2 weeks after quitting.

## 2018-03-01 NOTE — Discharge Summary (Signed)
Date of admission: 02/22/2018  Date of discharge: 02/23/2018  Admission diagnosis:   1.  Right ureterovaginal fistula 2.  Continuous urinary incontinence per vagina  Discharge diagnosis: Same   History and Physical: For full details, please see admission history and physical. Briefly, [name] is a 37 y.o. year old patient seen in consultation for Dr. Philis Pique after the patient was found to have acute right flank pain and right sided hydronephrosis on CT following a TAH, left oophorectomy and bilateral salpingectomyon 09/28/17. She underwent an uneventful right RPG and stent placement on 10/03/17 secondary to the right sided hydronephrosis. She had her stent removed on 11/10/17 and reports constant leakage of urine per her vagina since that time. There was no obvious intravesical pathology noted on her cysto at that time. She is having to wear 4-5 pads/day due to the constant leakage.   She underwent cystoscopy/vaginoscopy with right ureteroscopy on 01/18/18 which confirmed a distal right ureterovaginal fistula.   She is s/p robotic right ureteral reimplantation with right JJ stent placement  Hospital Course: The patient was monitored on the floor following surgery and had a routine post-op course.  Physical Exam:  General: Alert and oriented CV: RRR, palpable distal pulses Lungs: CTAB, equal chest rise Abdomen: Soft, NTND, no rebound or guarding Incisions: c/d/i GU: Foley draining clear-yellow urine Ext: NT, No erythema  Laboratory values: No results for input(s): HGB, HCT in the last 72 hours. No results for input(s): CREATININE in the last 72 hours.  Disposition: Home  Discharge instruction: The patient was instructed to be ambulatory but told to refrain from heavy lifting, strenuous activity, or driving.  Discharge medications:  Allergies as of 02/23/2018      Reactions   Bee Venom Anaphylaxis   Penicillins Anaphylaxis, Other (See Comments)   Has patient had a PCN  reaction causing immediate rash, facial/tongue/throat swelling, SOB or lightheadedness with hypotension: Yes Has patient had a PCN reaction causing severe rash involving mucus membranes or skin necrosis: No Has patient had a PCN reaction that required hospitalization: Yes Has patient had a PCN reaction occurring within the last 10 years: No If all of the above answers are "NO", then may proceed with Cephalosporin use.   Mushroom Extract Complex Nausea And Vomiting   Ciprofloxacin Rash   Powder Itching, Rash, Other (See Comments)   The powder from latex gloves      Medication List    STOP taking these medications   ketorolac 10 MG tablet Commonly known as:  TORADOL     TAKE these medications   acetaminophen 500 MG tablet Commonly known as:  TYLENOL Take 1,000 mg by mouth every 6 (six) hours as needed for moderate pain, fever or headache.   Diclofenac Sodium 3 % Gel Place 1 application onto the skin daily as needed (for pain).   docusate sodium 100 MG capsule Commonly known as:  COLACE Take 1 capsule (100 mg total) by mouth 2 (two) times daily.   Doxepin HCl 5 % Crea Apply 1 application topically daily as needed (for pain).   FLUoxetine 20 MG capsule Commonly known as:  PROZAC Take 1 capsule (20 mg total) by mouth daily.   HYDROcodone-acetaminophen 5-325 MG tablet Commonly known as:  NORCO Take 1-2 tablets by mouth every 6 (six) hours as needed for moderate pain or severe pain.   lidocaine 5 % ointment Commonly known as:  XYLOCAINE Apply 1 application topically as needed for moderate pain.   losartan 25 MG tablet Commonly  known as:  COZAAR Take 1 tablet (25 mg total) by mouth daily.   metFORMIN 500 MG 24 hr tablet Commonly known as:  GLUCOPHAGE XR Take 1 tablet (500 mg total) by mouth daily with breakfast.   ondansetron 4 MG tablet Commonly known as:  ZOFRAN Take 1 tablet (4 mg total) by mouth daily as needed for nausea or vomiting.   OVER THE COUNTER  MEDICATION Apply 1 application topically 3 (three) times daily as needed (psoraisis). OTC psoriasis cream   oxybutynin 10 MG 24 hr tablet Commonly known as:  DITROPAN XL Take 1 tablet (10 mg total) by mouth daily.   phenazopyridine 200 MG tablet Commonly known as:  PYRIDIUM Take 1 tablet (200 mg total) by mouth 3 (three) times daily as needed for pain.   rosuvastatin 20 MG tablet Commonly known as:  CRESTOR Take 1 tablet (20 mg total) by mouth daily.       Followup:  Follow-up Information    Ceasar Mons, MD Follow up on 03/09/2018.   Specialty:  Urology Why:  at 8:30 Contact information: [address] [place] [id number] [phone]

## 2018-03-24 ENCOUNTER — Ambulatory Visit: Payer: Medicaid Other | Admitting: Internal Medicine

## 2018-03-29 ENCOUNTER — Ambulatory Visit: Payer: Medicaid Other | Admitting: Internal Medicine

## 2018-05-01 ENCOUNTER — Telehealth: Payer: Self-pay

## 2018-05-01 NOTE — Telephone Encounter (Signed)
Claiborne Billings from [facility] called nurse line requesting authorization for patients upcoming ultrasound apt, please advise.

## 2018-07-07 ENCOUNTER — Encounter: Payer: Self-pay | Admitting: Family Medicine

## 2018-07-31 ENCOUNTER — Telehealth: Payer: Self-pay | Admitting: *Deleted

## 2018-07-31 ENCOUNTER — Ambulatory Visit: Payer: Medicaid Other | Admitting: Diagnostic Neuroimaging

## 2018-07-31 NOTE — Telephone Encounter (Signed)
Patient no-showed today's follow up appointment.

## 2018-08-01 ENCOUNTER — Encounter: Payer: Self-pay | Admitting: Diagnostic Neuroimaging

## 2018-11-21 ENCOUNTER — Encounter: Payer: Self-pay | Admitting: Family Medicine

## 2018-11-21 ENCOUNTER — Other Ambulatory Visit: Payer: Self-pay

## 2018-11-21 ENCOUNTER — Ambulatory Visit (INDEPENDENT_AMBULATORY_CARE_PROVIDER_SITE_OTHER): Payer: Medicaid Other | Admitting: Family Medicine

## 2018-11-21 VITALS — BP 154/86 | HR 86 | Temp 98.2°F | Ht 66.0 in | Wt 205.4 lb

## 2018-11-21 DIAGNOSIS — E1142 Type 2 diabetes mellitus with diabetic polyneuropathy: Secondary | ICD-10-CM | POA: Diagnosis present

## 2018-11-21 LAB — POCT GLYCOSYLATED HEMOGLOBIN (HGB A1C): HbA1c, POC (controlled diabetic range): 6.9 % (ref 0.0–7.0)

## 2018-11-21 MED ORDER — SUMATRIPTAN SUCCINATE 50 MG PO TABS: 50.0000 mg | ORAL_TABLET | ORAL | 0 refills | Status: DC | PRN

## 2018-11-21 MED ORDER — PROPRANOLOL HCL 40 MG PO TABS: 40.0000 mg | ORAL_TABLET | Freq: Every day | ORAL | 2 refills | Status: DC

## 2018-11-21 MED ORDER — DICLOFENAC SODIUM 3 % TD GEL: 1.0000 "application " | Freq: Every day | TRANSDERMAL | 1 refills | Status: DC | PRN

## 2018-11-21 NOTE — Progress Notes (Deleted)
Subjective:    [name], is a 38 y.o. female   History provider by patient No interpreter necessary.  Chief Complaint  Patient presents with  . Annual Exam    HPI: *** [name] is a 38yo F who presents for annual exam. Her chief concerns include weakness and burning in knees, blurry vision, and headaches.   LE Weakness About 2-3x/month, [name] reports feeling a burning sensation in BL knees followed by numbness below the knees. Sometimes she feels a sharp burning pain running down both legs. She reports this pain worsens with movement, but is often ocurring at rest. She states Tylenol has helped in the past. She reports falling due to the pain/numbness/weakness about 1-2x/month in the past few months.  Blurry Vision [name] states she has experienced episodes of blurry vision over the past 2 years. Recently, she states it is becoming more often with almost daily episodes of either blurry or double vision. She says she saw an Optometrist last year who suggested it may be tied to blood pressure, but she is doubtful as she feels her vision is worsening at a faster rate than her BP is rising.    Migraines [name] reports migraines occurring multiple times a month. They begin with sinus pressure and develop to BL frontal then occipital pressure/pain. She reports sensitivity to light and sound with these migraines and finds her vision becomes even more blurry than normal. Taking Excedrin Migraine has helped in the past.   Diabetes A1c increased today from 6.5 to 6.9. She reports no longer taking her Metformin as she states the pill is too difficult to swallow. She reports worsening numbness and burning pain in her legs and worsening vision as described above.   Hypertension BP today 152/86. [name] reports taking Losartan as prescribed. She was recently turned away from a dental procedure due to HTN. She endorses changes in vision and headaches as described  above.   Review of Systems  Constitutional: Negative for chills and fever.  Respiratory: Negative for cough.   Gastrointestinal: Negative for abdominal pain.    Patient's history was reviewed and updated as appropriate: allergies, current medications, past family history, past medical history, past social history, past surgical history and problem list.     Objective:     BP (!) 154/86   Pulse 86   Temp 98.2 F (36.8 C) (Oral)   Ht 5\' 6"  (1.676 m)   Wt 205 lb 6.4 oz (93.2 kg)   LMP 09/11/2017 (Approximate)   SpO2 97%   BMI 33.15 kg/m   Physical Exam Constitutional:      General: She is not in acute distress.    Appearance: Normal appearance.  Eyes:     Extraocular Movements: Extraocular movements intact.     Conjunctiva/sclera: Conjunctivae normal.  Cardiovascular:     Rate and Rhythm: Normal rate and regular rhythm.     Heart sounds: Normal heart sounds.  Pulmonary:     Effort: Pulmonary effort is normal.     Breath sounds: Normal breath sounds.  Abdominal:     General: Bowel sounds are normal.     Tenderness: There is no abdominal tenderness.  Musculoskeletal:     Right shoulder: She exhibits normal strength.     Right hip: She exhibits tenderness.     Left hip: She exhibits tenderness.     Comments: BL LE 5/5 strength  Neurological:     Mental Status: She is alert.  Comments: R-sided leg numbness        Assessment & Plan:   *** [name] is a 38yo F who presents with HTN, migraines, blurry vision, knee pain.  HTN -In office BP was 154/86 -Currently taking Losartan -Will add Propranolol for better BP control given migraine hx  Migraines Given sensitivity to light/sound, severity of pain, shifting BL headache relieved only by rest, more likely to be Migraines than Tension HA. Occurring almost every other day now. -Will start Propranolol 40mg /day as preventative measure -Will give Sumatriptan for abortive measures as needed. 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 Potentially tied to HTN. -Encouraged to continue f/u with optometrist   Knee Pain and Falls Episodes of BL burning knee pain with numbness and tingling in lower extremities, occasionally causing weakness and falls. Less    Supportive care and return precautions reviewed.  No follow-ups on file.  Boykin Peek, Medical Student

## 2018-11-21 NOTE — Patient Instructions (Signed)
It was great to meet you today! Thank you for letting me participate in your care!  Today, we discussed knee pain and your headaches. I am curious to see if we can help both by stopping your headaches. I have sent in two new prescriptions for you to see if they help. Please take them as directed. Propranolol will be taken every day and Sumatriptan will be taken at the FIRST sign of headache to stop the headache. If it works continue to use it but if not then stop taking it.  I will see you in 2 months.  Be well, Harolyn Rutherford, DO PGY-2, [facility]

## 2018-11-23 NOTE — Progress Notes (Signed)
Subjective: Chief Complaint  Patient presents with  . Annual Exam    HPI: [name] is a 38 y.o. presenting to clinic today to discuss the following:  HPI:  Ms. [name] is a 38yo F who presents for annual exam. Her chief concerns include weakness and burning in knees, blurry vision, and headaches.   LE Weakness About 2-3x/month, Ms. [name] reports feeling a burning sensation in BL knees followed by numbness below the knees. Sometimes she feels a sharp burning pain running down both legs. She reports this pain worsens with movement, but is often ocurring at rest. She states Tylenol has helped in the past. She reports falling due to the pain/numbness/weakness about 1-2x/month in the past few months.  Blurry Vision Ms. [name] states she has experienced episodes of blurry vision over the past 2 years. Recently, she states it is becoming more often with almost daily episodes of either blurry or double vision. She says she saw an Optometrist last year who suggested it may be tied to blood pressure, but she is doubtful as she feels her vision is worsening at a faster rate than her BP is rising.    Migraines Ms. [name] reports migraines occurring multiple times a month. They begin with sinus pressure and develop to BL frontal then occipital pressure/pain. She reports sensitivity to light and sound with these migraines and finds her vision becomes even more blurry than normal. Taking Excedrin Migraine has helped in the past.   Diabetes A1c increased today from 6.5 to 6.9. She reports no longer taking her Metformin as she states the pill is too difficult to swallow. She reports worsening numbness and burning pain in her legs and worsening vision as described above.   Hypertension BP today 152/86. Ms. [name] reports taking Losartan as prescribed. She was recently turned away from a dental procedure due to HTN. She endorses changes in vision and headaches as described above.     Review of Systems  Constitutional: Negative for chills and fever.  Respiratory: Negative for cough.   Gastrointestinal: Negative for abdominal pain.    Patient's history was reviewed and updated as appropriate: allergies, current medications, past family history, past medical history, past social history, past surgical history and problem list.  Health Maintenance: referral to opthamology for eye exam     ROS noted in HPI.   Past Medical, Surgical, Social, and Family History Reviewed & Updated per EMR.   Pertinent Historical Findings include:   Social History   Tobacco Use  Smoking Status Current Some Day Smoker  . Packs/day: 0.50  . Years: 16.00  . Pack years: 8.00  . Types: Cigarettes  . Start date: 05/01/2014  Smokeless Tobacco Never Used    Objective: BP (!) 154/86   Pulse 86   Temp 98.2 F (36.8 C) (Oral)   Ht 5\' 6"  (1.676 m)   Wt 205 lb 6.4 oz (93.2 kg)   LMP 09/11/2017 (Approximate)   SpO2 97%   BMI 33.15 kg/m  Vitals and nursing notes reviewed  Physical Exam Constitutional:      General: She is not in acute distress.    Appearance: Normal appearance.  Eyes:     Extraocular Movements: Extraocular movements intact.     Conjunctiva/sclera: Conjunctivae normal.  Cardiovascular:     Rate and Rhythm: Normal rate and regular rhythm.     Heart sounds: Normal heart sounds.  Pulmonary:     Effort: Pulmonary effort is normal.  Breath sounds: Normal breath sounds.  Abdominal:     General: Bowel sounds are normal.     Tenderness: There is no abdominal tenderness.  Musculoskeletal:     Right shoulder: She exhibits normal strength.     Right hip: She exhibits tenderness.     Left hip: She exhibits tenderness.     Comments: BL LE 5/5 strength  Neurological:     Mental Status: She is alert.     Comments: R-sided leg numbness   Results for orders placed or performed in visit on 11/21/18 (from the past 72 hour(s))  HgB A1c     Status: None   Collection  Time: 11/21/18  2:07 PM  Result Value Ref Range   Hemoglobin A1C     HbA1c POC (<> result, manual entry)     HbA1c, POC (prediabetic range)     HbA1c, POC (controlled diabetic range) 6.9 0.0 - 7.0 %    Assessment/Plan:  No problem-specific Assessment & Plan notes found for this encounter. HTN -In office BP was 154/86 -Currently taking Losartan -Will add Propranolol for better BP control given migraine hx  Migraines Given sensitivity to light/sound, severity of pain, shifting BL headache relieved only by rest, more likely to be Migraines than Tension HA. Occurring almost every other day now. -Will start Propranolol 40mg /day as preventative measure -Will give Sumatriptan for abortive measures as needed. Discussed stopping this if not working after 2-3 doses  Blurry Vision Less likely Diabetic Retinopathy given well controlled A1c. Potentially tied to HTN. -Encouraged to continue f/u with optometrist and made referral to opthalmology for yearly diabetic eye exam  Knee Pain and Falls Episodes of BL burning knee pain with numbness and tingling in lower extremities, occasionally causing weakness and falls. Her description an d pattern do not match any consistent MSK issue. Her neuro exam was completely normal except report mild numbness on the left side. Unclear what is causing this but possibly sequela from migraines. Will continue to follow.   Supportive care and return precautions reviewed.  PATIENT EDUCATION PROVIDED: See AVS    Diagnosis and plan along with any newly prescribed medication(s) were discussed in detail with this patient today. The patient verbalized understanding and agreed with the plan. Patient advised if symptoms worsen return to clinic or ER.   Health Maintainance:   Orders Placed This Encounter  Procedures  . HgB A1c    Meds ordered this encounter  Medications  . Diclofenac Sodium 3 % GEL    Sig: Place 1 application onto the skin daily as needed  (for pain).    Dispense:  100 g    Refill:  1  . propranolol (INDERAL) 40 MG tablet    Sig: Take 1 tablet (40 mg total) by mouth daily.    Dispense:  30 tablet    Refill:  2  . SUMAtriptan (IMITREX) 50 MG tablet    Sig: Take 1 tablet (50 mg total) by mouth every 2 (two) hours as needed for migraine. May repeat in 2 hours if headache persists or recurs.    Dispense:  20 tablet    Refill:  0    Boykin Peek, Medical Student  Resident Attestation  I saw and evaluated the patient, performing the key elements of the service.I personally performed or re-performed the history, physical exam, and medical decision making activities of this service and have verified that the service and findings are accurately documented in the student's note.I developed the management plan that  is described in the medical student's note, and I agree with the content, with my edits above.   Harolyn Rutherford, DO 11/23/2018, 8:35 AM PGY-2 [place]

## 2019-01-22 ENCOUNTER — Ambulatory Visit: Payer: Medicaid Other | Admitting: Family Medicine

## 2019-01-26 IMAGING — XA DG FLUORO GUIDE LUMBAR PUNCTURE
1 series · 1 of 1 positions shown · non-contrast
Comparison: none

CLINICAL DATA: Optic nerve edema

[Series 1: ortho standard · 1 of 1 slices shown]
[im 1/1]
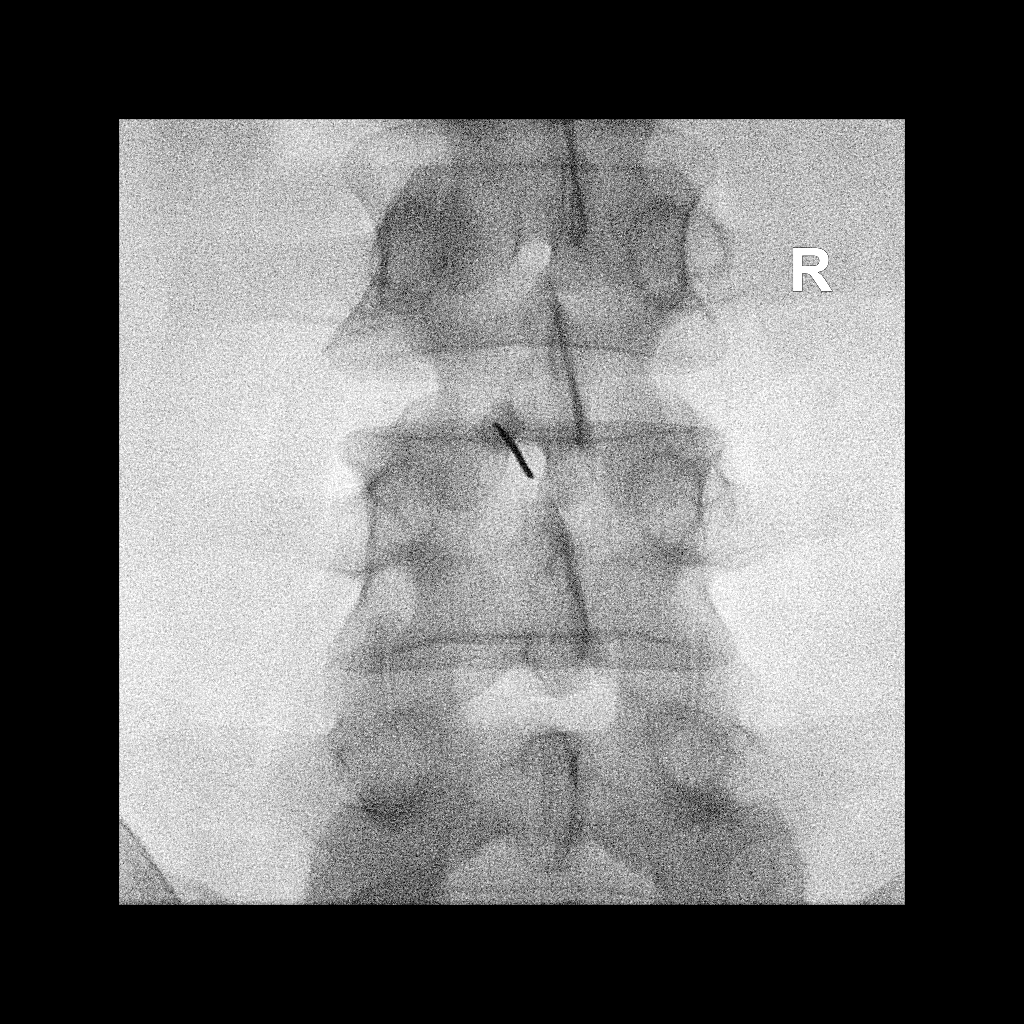

[1 of 1 positions shown; findings below may reference images not displayed]

EXAM:
DIAGNOSTIC LUMBAR PUNCTURE UNDER FLUOROSCOPIC GUIDANCE

FLUOROSCOPY TIME:  9 seconds

PROCEDURE:
Informed consent was obtained from the patient prior to the
procedure, including potential complications of headache, allergy,
and pain. With the patient prone, the lower back was prepped with
Betadine. 1% Lidocaine was used for local anesthesia. Lumbar
puncture was performed at the left L3-4 level using a 20 gauge
needle with return of clear CSF with an opening pressure of 20 cm
water. 8.5ml of CSF were obtained for laboratory studies. Closing
pressure was 13 cm water. The patient tolerated the procedure well
and there were no apparent complications.
FINDINGS: Images document needle placement in the CSF space at L3-4 on the
left.
IMPRESSION: Successful lumbar puncture for pressure measurements and fluid
testing.

## 2019-02-05 ENCOUNTER — Ambulatory Visit: Payer: Medicaid Other | Admitting: Family Medicine

## 2019-02-27 ENCOUNTER — Telehealth (INDEPENDENT_AMBULATORY_CARE_PROVIDER_SITE_OTHER): Payer: Medicaid Other | Admitting: Family Medicine

## 2019-02-27 ENCOUNTER — Other Ambulatory Visit: Payer: Self-pay

## 2019-02-27 DIAGNOSIS — E1142 Type 2 diabetes mellitus with diabetic polyneuropathy: Secondary | ICD-10-CM | POA: Diagnosis not present

## 2019-02-27 MED ORDER — PREGABALIN 50 MG PO CAPS: 50.0000 mg | ORAL_CAPSULE | Freq: Every day | ORAL | 1 refills | Status: DC

## 2019-02-27 NOTE — Progress Notes (Signed)
[facility] Visit  Patient consented to have virtual visit. Method of visit: Telephone  Encounter participants: Patient: [name] - located at home Provider: Benay Pike - located at [facility] Others (if applicable): none  Chief Complaint: b/l leg pain  HPI: Pain below knees is the same as before but now Going up into hips.  Feels like someone is pouring hot grease on her legs. Now it is knees and above.  She describes it as burning w/ numbness. This has been a few weeks.  10/10 in intensity.   Constant pain.  Walking makes it worse.  Diclofenac Gel will work for a little bit.  Just takes tylenol 1000mg  at bedtime usually which helps a little during the night but not during the day.  No saddle anesthesia or incontinence.   Was taking gabapentin previously and maxed out and it didn't help so she stopped taking it. Patient not taking metformin due to GI side effects.    Patient is taking the propranolol for her migraines. .    ROS: per HPI  Pertinent PMHx: Diabetes,   Exam:  Respiratory: no cough.  Able to speak full sentences without difficulty.    Assessment/Plan:  Diabetic neuropathy (HCC) - pain describes neuropathic pain symptoms with paresthesia, numbness bilaterally that is worsening proximally.  She is not currently on any medications to treat neuropathic pain.  She states gabapentin did not work for her previously.  She has had a lumbar MRI two years ago that showed mild degenerative changes.   - start pt on lyrica 50mg  qdaily.  If pt does not improve may need to try nortriptyline, but holding for now as she is already on an SSRI.       Time spent during visit with patient: 12 minutes

## 2019-02-28 ENCOUNTER — Telehealth: Payer: Self-pay | Admitting: *Deleted

## 2019-02-28 NOTE — Telephone Encounter (Signed)
Pregabalin not covered, see below:     Let me know if you would like to pursue a PA. Christen Bame, CMA

## 2019-02-28 NOTE — Assessment & Plan Note (Signed)
-   pain describes neuropathic pain symptoms with paresthesia, numbness bilaterally that is worsening proximally.  She is not currently on any medications to treat neuropathic pain.  She states gabapentin did not work for her previously.  She has had a lumbar MRI two years ago that showed mild degenerative changes.   - start pt on lyrica 50mg  qdaily.  If pt does not improve may need to try nortriptyline, but holding for now as she is already on an SSRI.

## 2019-04-12 ENCOUNTER — Other Ambulatory Visit: Payer: Self-pay | Admitting: Family Medicine

## 2019-04-20 ENCOUNTER — Ambulatory Visit: Payer: Medicaid Other | Admitting: Family Medicine

## 2019-04-20 ENCOUNTER — Other Ambulatory Visit: Payer: Self-pay

## 2019-04-20 ENCOUNTER — Ambulatory Visit
Admission: RE | Admit: 2019-04-20 | Discharge: 2019-04-20 | Disposition: A | Discharge status: Home / Self Care | Payer: Medicaid Other | Source: Ambulatory Visit | Attending: Family Medicine | Admitting: Family Medicine

## 2019-04-20 VITALS — BP 148/90 | HR 79

## 2019-04-20 DIAGNOSIS — M25561 Pain in right knee: Secondary | ICD-10-CM | POA: Diagnosis not present

## 2019-04-20 DIAGNOSIS — E119 Type 2 diabetes mellitus without complications: Secondary | ICD-10-CM

## 2019-04-20 LAB — POCT GLYCOSYLATED HEMOGLOBIN (HGB A1C): Hemoglobin A1C: 6.8 % — AB (ref 4.0–5.6)

## 2019-04-20 MED ORDER — MELOXICAM 15 MG PO TABS: 15.0000 mg | ORAL_TABLET | Freq: Every day | ORAL | 0 refills | Status: DC

## 2019-04-20 NOTE — Assessment & Plan Note (Signed)
Although exam today was limited due to patient's pain, I am concerned that she may have a meniscal injury or osteoarthritis that has flared recently.  Osteoarthritis is less likely since she is only 46, but patient says that she has been told that she has knee osteoarthritis before.  She would likely benefit from an ultrasound, so I will refer her to sports medicine for further assessment and management.  I will also obtain 4 view standing x-rays to evaluate for any bony abnormality including osteoarthritis.  Patient was counseled to continue icing the knee and to elevated as much as possible.  She was given a prescription for meloxicam 15 mg once daily for 15 days as needed and told that she can also take up to 4 g of Tylenol per day for pain.

## 2019-04-20 NOTE — Progress Notes (Signed)
Subjective:    [name] - 38 y.o. female MRN [id number]  Date of birth: [birth year]  CC:  [name] is here for R knee pain.  HPI: Patient reports that she has been starting a new workout regimen to try to lose weight over the last few days, which has involved some squats and other weightbearing exercises.  She is tolerated these well, but yesterday, she went on a 20-minute easy walk with her daughter and about an hour afterward, she noticed pain and swelling of her right knee.  She says that she has had intermittent swelling of both of her knees in the past, but the swelling is never been this bad.  She has been icing it and taking some Tylenol, which is been somewhat helpful.  She noted that it was swollen even worse this morning.  It is painful to put weight on that foot and she is using her mother-in-law's cane to help her with ambulation.  Health Maintenance:  Health Maintenance Due  Topic Date Due  . OPHTHALMOLOGY EXAM  05/31/2018  . FOOT EXAM  07/26/2018    -  reports that she has been smoking cigarettes. She started smoking about 4 years ago. She has a 8.00 pack-year smoking history. She has never used smokeless tobacco. - Review of Systems: Per HPI. - Past Medical History: Patient Active Problem List   Diagnosis Date Noted  . Acute pain of right knee 04/20/2019  . 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 Hyperlipidemia 01/20/2018  . Tobacco use disorder 01/19/2018  . Anxiety state 01/17/2018  . Abdominal pain, RLQ 10/03/2017  . Postoperative state 09/28/2017  . Diabetic neuropathy ([place]) 06/21/2017  . Anemia 06/20/2017  . Diabetes type 2, controlled ([place]) 05/16/2017  . H/O: C-section 05/16/2017  . Vitamin B12 deficiency 05/16/2017  . Health care maintenance 10/18/2016  . Psoriasis 10/01/2013  . Hypertension 09/26/2012  . Obesity 09/26/2012  . Subcutaneous nodule 09/26/2012  . History of abnormal Pap smear 09/26/2012   - Medications: reviewed and  updated   Objective:   Physical Exam BP (!) 148/90   Pulse 79   LMP 09/11/2017 (Approximate)   SpO2 98%  Gen: NAD, alert, cooperative with exam, well-appearing Knee, right: Inspection was negative for erythema, positive for effusion, and negative for obvious bony abnormalities. Palpation was negative for obvious Baker's cyst development, negative for asymmetric warmth, positive for joint line tenderness, positive for condyle tenderness, positive for patellar tenderness, negative for patellar crepitus, and negative for tenderness of the pes anserine bursa. Patellar and quadriceps tendons were unremarkable. ROM normal in flexion (135 degrees) and extension (0 degrees) and lower leg rotation. Normal hamstring and quadriceps strength. Neurovascularly intact bilaterally. [place], [place], drawer testing were equivocal due to patient's pain. Knee, left: Inspection was negative for abnormality, nontender to palpation, ligament testing was normal, normal strength and normal range of motion.  Neurovascularly intact Skin: no rashes, normal turgor  Neuro: no gross deficits.  Psych: good insight, alert and oriented        Assessment & Plan:   Acute pain of right knee Although exam today was limited due to patient's pain, I am concerned that she may have a meniscal injury or osteoarthritis that has flared recently.  Osteoarthritis is less likely since she is only 69, but patient says that she has been told that she has knee osteoarthritis before.  She would likely benefit from an ultrasound, so I will refer her to sports medicine for further assessment and management.  I will also obtain 4 view standing x-rays to evaluate for any bony abnormality including osteoarthritis.  Patient was counseled to continue icing the knee and to elevated as much as possible.  She was given a prescription for meloxicam 15 mg once daily for 15 days as needed and told that she can also take up to 4 g of Tylenol per day for  pain.    [name], M.D. 04/20/2019, 11:19 AM PGY-2, [place]

## 2019-04-20 NOTE — Patient Instructions (Addendum)
It was nice meeting you today Ms. [name]!  Please continue to ice your knee and elevate it as much as possible.  I am ordering x-rays which can be done at [address] today before 5 PM.  I am also sending a referral to the sports medicine center next to [place].  They may want to ultrasound your knee and will likely have further recommendations for you.  I am prescribing meloxicam 15 mg, which you can take up to once per day as needed.  You can also take Tylenol up to 4 g/day as needed.  I hope you feel better soon.  If you have any questions or concerns, please feel free to call the clinic.   Be well,  Dr. Shan Levans

## 2019-04-25 ENCOUNTER — Encounter: Payer: Self-pay | Admitting: Family Medicine

## 2019-04-25 ENCOUNTER — Ambulatory Visit: Payer: Medicaid Other | Admitting: Family Medicine

## 2019-04-25 ENCOUNTER — Other Ambulatory Visit: Payer: Self-pay

## 2019-04-25 VITALS — BP 148/90 | Ht 66.0 in | Wt 205.0 lb

## 2019-04-25 DIAGNOSIS — M25561 Pain in right knee: Secondary | ICD-10-CM

## 2019-04-25 MED ORDER — METHYLPREDNISOLONE ACETATE 40 MG/ML IJ SUSP
40.0000 mg | Freq: Once | INTRAMUSCULAR | Status: AC
  Administered 2019-04-25: 40 mg via INTRA_ARTICULAR

## 2019-04-25 NOTE — Progress Notes (Signed)
PCP: Nuala Alpha, DO  Subjective:   HPI: Patient is a 38 y.o. female here for right knee swelling.  Patient states she's had problems with her knees swelling off and on for several years. Current issue started a week ago - she did sumo squats that day but doesn't recall pain or an injury. Then Thursday went for a 20 minute walk and after this her right knee swelled a lot. Associated pain and soreness described as 52/10 in pain. Worse with any motion of knee, walking. Radiates up her thigh. States leg will go numb with her swelling. No skin changes. History of psoriasis.  Past Medical History:  Diagnosis Date  . Abdominal lipoma 05/2015  . Abdominal pain, RLQ 10/03/2017  . Anxiety   . Arthritis    lower back  . Diabetes mellitus without complication ([place])    type 2  . DM type 2 with diabetic peripheral neuropathy (HCC)    legs and feet  . GERD (gastroesophageal reflux disease)   . Headache    Migraines  . History of MRSA infection ~ 2006   leg  . History of shingles   . Hx of hydronephrosis    following total hysterectomy surgery ; Right kidney ; see CT abdomen 10-02-17 epic; states " a plug had got damaged during the surgery so thats why they put the stent in"   . Hypertension    under control with med., has been on med. x 6 mos.  . Incontinence of urine in female   . Lumbosacral spondylolysis    see MRI 06-01-17 epic   . Mood swing   . Nocturia   . Optic nerve swelling    seen by Dr Mora Bellman 09-07-17, did lumbar puncture 09-19-17; patient states " they never called me about results ut i have an appt with him in april"; patient also states  " my vision is still blurry and worse when i get the migraines, they think the migraines are causing it"  . Psoriasis     Current Outpatient Medications on File Prior to Visit  Medication Sig Dispense Refill  . estradiol (ESTRACE) 1 MG tablet Take 1 mg by mouth daily.    Marland Kitchen FLUoxetine (PROZAC) 20 MG capsule Take 1 capsule (20 mg  total) by mouth daily. (Patient taking differently: Take 20 mg by mouth daily. ) 30 capsule 0  . meloxicam (MOBIC) 15 MG tablet Take 1 tablet (15 mg total) by mouth daily. 15 tablet 0  . OVER THE COUNTER MEDICATION Apply 1 application topically 3 (three) times daily as needed (psoraisis). OTC psoriasis cream    . pregabalin (LYRICA) 50 MG capsule Take 1 capsule (50 mg total) by mouth daily. 30 capsule 1  . propranolol (INDERAL) 40 MG tablet TAKE 1 TABLET BY MOUTH EVERY DAY 30 tablet 2  . rosuvastatin (CRESTOR) 20 MG tablet Take 1 tablet (20 mg total) by mouth daily. 90 tablet 2  . SUMAtriptan (IMITREX) 50 MG tablet Take 1 tablet (50 mg total) by mouth every 2 (two) hours as needed for migraine. May repeat in 2 hours if headache persists or recurs. 20 tablet 0  . varenicline (CHANTIX) 0.5 MG tablet Take 1 tablet (0.5 mg total) by mouth 2 (two) times daily. 30 tablet 0   No current facility-administered medications on file prior to visit.     Past Surgical History:  Procedure Laterality Date  . CESAREAN SECTION  06/21/2008; 08/18/2010  . CESAREAN SECTION N/A 07/10/2014   Procedure: REPEAT CESAREAN  SECTION;  Surgeon: Daria Pastures, MD;  Location: [facility];  Service: Obstetrics;  Laterality: N/A;  . CHOLECYSTECTOMY  07/10/2008  . CYSTOSCOPY N/A 09/28/2017   Procedure: CYSTOSCOPY;  Surgeon: Bobbye Charleston, MD;  Location: [facility];  Service: Gynecology;  Laterality: N/A;  . CYSTOSCOPY Right 01/18/2018   Procedure: CYSTOSCOPY, RETROGRADE / PYELOGRAM, URETEROSCOPY;  Surgeon: Ceasar Mons, MD;  Location: [facility];  Service: Urology;  Laterality: Right;  . CYSTOSCOPY W/ URETERAL STENT PLACEMENT Right 10/03/2017   Procedure: CYSTOSCOPY WITH RETROGRADE PYELOGRAM/URETERAL STENT PLACEMENT;  Surgeon: Ceasar Mons, MD;  Location: [facility];  Service: Urology;  Laterality: Right;  . CYSTOSCOPY W/ URETERAL STENT PLACEMENT Right 02/22/2018   Procedure: CYSTOSCOPY WITH  URETERAL STENT PLACEMENT;  Surgeon: Ceasar Mons, MD;  Location: [facility];  Service: Urology;  Laterality: Right;  . LIPOMA EXCISION  2014   back  . LIPOMA EXCISION N/A 05/21/2015   Procedure: EXCISION LIPOMAS ON ABDOMEN;  Surgeon: Erroll Luna, MD;  Location: [facility];  Service: General;  Laterality: N/A;  . LUMBAR PUNCTURE  09/19/17   [facility], Dr. Curt Bears  . OOPHORECTOMY Left 09/28/2017   Procedure: OOPHORECTOMY;  Surgeon: Bobbye Charleston, MD;  Location: [facility];  Service: Gynecology;  Laterality: Left;  . ROBOTIC ASSISTED LAPAROSCOPIC LYSIS OF ADHESION N/A 09/28/2017   Procedure: ROBOTIC ASSISTED LAPAROSCOPIC LYSIS OF ADHESION;  Surgeon: Bobbye Charleston, MD;  Location: [facility];  Service: Gynecology;  Laterality: N/A;  . ROBOTIC ASSISTED TOTAL HYSTERECTOMY WITH BILATERAL SALPINGO OOPHERECTOMY Bilateral 09/28/2017   Procedure: ROBOTIC ASSISTED TOTAL HYSTERECTOMY WITH BILATERAL SALPINGECTOMY;  Surgeon: Bobbye Charleston, MD;  Location: [facility];  Service: Gynecology;  Laterality: Bilateral;  . ROBOTICALLY ASSISTED LAPAROSCOPIC URETERAL RE-IMPLANTATION Right 02/22/2018   Procedure: ROBOTICALLY ASSISTED LAPAROSCOPIC URETERAL RE-IMPLANTATION;  Surgeon: Ceasar Mons, MD;  Location: [facility];  Service: Urology;  Laterality: Right;  ONLY NEEDS 240 MIN TOTAL FOR ALL PROCEDURES  . TUBAL LIGATION    . WISDOM TOOTH EXTRACTION      Allergies  Allergen Reactions  . Bee Venom Anaphylaxis  . Penicillins Anaphylaxis and Other (See Comments)    Has patient had a PCN reaction causing immediate rash, facial/tongue/throat swelling, SOB or lightheadedness with hypotension: Yes Has patient had a PCN reaction causing severe rash involving mucus membranes or skin necrosis: No Has patient had a PCN reaction that required hospitalization: Yes Has patient had a PCN reaction occurring within the last 10 years: No If all of the above answers are "NO", then may  proceed with Cephalosporin use.   . Mushroom Extract Complex Nausea And Vomiting  . Ciprofloxacin Rash  . Powder Itching, Rash and Other (See Comments)    The powder from latex gloves    Social History   Socioeconomic History  . Marital status: Married    Spouse name: Not on file  . Number of children: Not on file  . Years of education: Not on file  . Highest education level: Not on file  Occupational History  . Not on file  Social Needs  . Financial resource strain: Not on file  . Food insecurity    Worry: Not on file    Inability: Not on file  . Transportation needs    Medical: Not on file    Non-medical: Not on file  Tobacco Use  . Smoking status: Current Some Day Smoker    Packs/day: 0.50    Years: 16.00    Pack years: 8.00  Types: Cigarettes    Start date: 05/01/2014  . Smokeless tobacco: Never Used  Substance and Sexual Activity  . Alcohol use: No    Alcohol/week: 0.0 standard drinks  . Drug use: No  . Sexual activity: Yes    Birth control/protection: None  Lifestyle  . Physical activity    Days per week: Not on file    Minutes per session: Not on file  . Stress: Not on file  Relationships  . Social Herbalist on phone: Not on file    Gets together: Not on file    Attends religious service: Not on file    Active member of club or organization: Not on file    Attends meetings of clubs or organizations: Not on file    Relationship status: Not on file  . Intimate partner violence    Fear of current or ex partner: Not on file    Emotionally abused: Not on file    Physically abused: Not on file    Forced sexual activity: Not on file  Other Topics Concern  . Not on file  Social History Narrative   Live home with husband and 3 kids.   Education HS grad.  Culinary grad.  Caffeine  8 glasses (sweet tea), some soda's occ.  Working: [employer].      Family History  Problem Relation Age of Onset  . Drug abuse Mother   . Heart disease Maternal  Grandmother   . Heart disease Maternal Grandfather     BP (!) 148/90   Ht 5\' 6"  (1.676 m)   Wt 205 lb (93 kg)   LMP 09/11/2017 (Approximate)   BMI 33.09 kg/m   Review of Systems: See HPI above.     Objective:  Physical Exam:  Gen: NAD, comfortable in exam room  Right knee: Mod effusion.  No other gross deformity, ecchymoses. Diffuse anterior TTP. ROM 0 - 90 degrees with 5/5 strength flexion and extension. Negative ant/post drawers. Negative valgus/varus testing. Negative lachmans. Negative patellar apprehension.  Pain attempting meniscal maneuvers. NV intact distally.  Left knee: No deformity. FROM with 5/5 strength. No tenderness to palpation. NVI distally.   Assessment & Plan:  1. Right knee effusion - Independently reviewed radiographs and no abnormalities noted aside from small effusion.  Aspiration and injection performed today - will send for cell count, crystals, culture.  Will contact patient with results and next steps.  Icing, compression, elevation, aleve or ibuprofen.    After informed written consent timeout was performed, patient was lying supine on exam table.  Right knee was prepped with alcohol swab.  Utilizing superolateral approach with ultrasound guidance, 3 mL of bupivicaine was used for local anesthesia.  Then using an 18g needle on 60cc syringe,12 mL of cloudy straw-colored fluid was aspirated from right knee.  Knee was then injected with 3:1 bupivicaine:depomedrol.  Patient tolerated procedure well without immediate complications

## 2019-04-25 NOTE — Patient Instructions (Addendum)
You have a large knee effusion. We drained this today and gave you a cortisone injection. Next steps will depend on the lab results from this fluid. Ice the knee 15 minutes at a time 3-4 times a day. Elevate it above your heart as much as possible. Aleve 2 tabs twice a day with food OR ibuprofen 600mg  three times a day with food for pain and inflammation as needed - take rarely because it can increase your blood pressure - the shot should help you not need this as much. Ok to take tylenol with this. Ace wrap or sleeve to help with swelling.

## 2019-04-26 LAB — SYNOVIAL FLUID, CELL COUNT
Eos, Fluid: 0 %
Lining Cells, Synovial: 0 %
Lymphs, Fluid: 5 %
Macrophages Fld: 5 %
Nuc cell # Fld: 2697 cells/uL — ABNORMAL HIGH (ref 0–200)
Polys, Fluid: 90 %
RBC, Fluid: 4000 /uL

## 2019-05-01 ENCOUNTER — Other Ambulatory Visit: Payer: Self-pay | Admitting: Family Medicine

## 2019-05-07 ENCOUNTER — Telehealth: Payer: Self-pay | Admitting: Family Medicine

## 2019-05-07 NOTE — Telephone Encounter (Signed)
Spoke with patient and scheduled her a follow up for July 8th.

## 2019-05-07 NOTE — Telephone Encounter (Signed)
That's disappointing.  Can she come in to be reevaluated?  I think we're going to have to do some additional testing/labwork.

## 2019-05-07 NOTE — Telephone Encounter (Signed)
Patient calling with an update. States she has not had any improvements with her right knee

## 2019-05-09 ENCOUNTER — Ambulatory Visit
Admission: RE | Admit: 2019-05-09 | Discharge: 2019-05-09 | Disposition: A | Discharge status: Home / Self Care | Payer: Medicaid Other | Source: Ambulatory Visit | Attending: Family Medicine | Admitting: Family Medicine

## 2019-05-09 ENCOUNTER — Other Ambulatory Visit: Payer: Self-pay

## 2019-05-09 ENCOUNTER — Ambulatory Visit: Payer: Medicaid Other | Admitting: Family Medicine

## 2019-05-09 VITALS — BP 156/90 | Temp 98.7°F | Ht 67.0 in | Wt 205.0 lb

## 2019-05-09 DIAGNOSIS — M25551 Pain in right hip: Secondary | ICD-10-CM | POA: Diagnosis not present

## 2019-05-09 DIAGNOSIS — M25561 Pain in right knee: Secondary | ICD-10-CM | POA: Diagnosis present

## 2019-05-09 MED ORDER — OXYCODONE-ACETAMINOPHEN 5-325 MG PO TABS: 1.0000 | ORAL_TABLET | Freq: Four times a day (QID) | ORAL | 0 refills | Status: DC | PRN

## 2019-05-09 NOTE — Patient Instructions (Signed)
Get x-rays of your right hip after you leave today - we'll call you with the results. Aleve 2 tabs twice a day with food as needed for pain and inflammation. Oxycodone as needed for severe pain - ration this as we don't refill this medication. Can use topical aspercreme up to 4 times a day, salon pas patches if needed. We will try to get an MRI of this knee approved. Continue home exercises of your knee with knee extensions, straight leg raises.

## 2019-05-10 ENCOUNTER — Encounter: Payer: Self-pay | Admitting: Family Medicine

## 2019-05-10 NOTE — Progress Notes (Signed)
PCP: Nuala Alpha, DO  Subjective:   HPI: Patient is a 38 y.o. female here for right knee swelling.  6/24: Patient states she's had problems with her knees swelling off and on for several years. Current issue started a week ago - she did sumo squats that day but doesn't recall pain or an injury. Then Thursday went for a 20 minute walk and after this her right knee swelled a lot. Associated pain and soreness described as 52/10 in pain. Worse with any motion of knee, walking. Radiates up her thigh. States leg will go numb with her swelling. No skin changes. History of psoriasis.  7/8: Patient reports the aspiration/injection of her knee didn't provide benefit. Feels like knee is giving out on her and she's fallen a couple times because of this. Wearing knee brace, took some of a family member's oxycodone due to severity of her pain. Now getting pain up into right hip. No skin changes. No fevers.  Past Medical History:  Diagnosis Date  . Abdominal lipoma 05/2015  . Abdominal pain, RLQ 10/03/2017  . Anxiety   . Arthritis    lower back  . Diabetes mellitus without complication ([place])    type 2  . DM type 2 with diabetic peripheral neuropathy (HCC)    legs and feet  . GERD (gastroesophageal reflux disease)   . Headache    Migraines  . History of MRSA infection ~ 2006   leg  . History of shingles   . Hx of hydronephrosis    following total hysterectomy surgery ; Right kidney ; see CT abdomen 10-02-17 epic; states " a plug had got damaged during the surgery so thats why they put the stent in"   . Hypertension    under control with med., has been on med. x 6 mos.  . Incontinence of urine in female   . Lumbosacral spondylolysis    see MRI 06-01-17 epic   . Mood swing   . Nocturia   . Optic nerve swelling    seen by Dr Mora Bellman 09-07-17, did lumbar puncture 09-19-17; patient states " they never called me about results ut i have an appt with him in april"; patient also states   " my vision is still blurry and worse when i get the migraines, they think the migraines are causing it"  . Psoriasis     Current Outpatient Medications on File Prior to Visit  Medication Sig Dispense Refill  . estradiol (ESTRACE) 1 MG tablet Take 1 mg by mouth daily.    Marland Kitchen FLUoxetine (PROZAC) 20 MG capsule Take 1 capsule (20 mg total) by mouth daily. (Patient taking differently: Take 20 mg by mouth daily. ) 30 capsule 0  . meloxicam (MOBIC) 15 MG tablet Take 1 tablet (15 mg total) by mouth daily. 15 tablet 0  . OVER THE COUNTER MEDICATION Apply 1 application topically 3 (three) times daily as needed (psoraisis). OTC psoriasis cream    . pregabalin (LYRICA) 50 MG capsule Take 1 capsule (50 mg total) by mouth daily. 30 capsule 1  . propranolol (INDERAL) 40 MG tablet TAKE 1 TABLET BY MOUTH EVERY DAY 30 tablet 2  . rosuvastatin (CRESTOR) 20 MG tablet Take 1 tablet (20 mg total) by mouth daily. 90 tablet 2  . SUMAtriptan (IMITREX) 50 MG tablet Take 1 tablet (50 mg total) by mouth every 2 (two) hours as needed for migraine. May repeat in 2 hours if headache persists or recurs. 20 tablet 0  . varenicline (CHANTIX)  0.5 MG tablet Take 1 tablet (0.5 mg total) by mouth 2 (two) times daily. 30 tablet 0   No current facility-administered medications on file prior to visit.     Past Surgical History:  Procedure Laterality Date  . CESAREAN SECTION  06/21/2008; 08/18/2010  . CESAREAN SECTION N/A 07/10/2014   Procedure: REPEAT CESAREAN SECTION;  Surgeon: Daria Pastures, MD;  Location: [facility];  Service: Obstetrics;  Laterality: N/A;  . CHOLECYSTECTOMY  07/10/2008  . CYSTOSCOPY N/A 09/28/2017   Procedure: CYSTOSCOPY;  Surgeon: Bobbye Charleston, MD;  Location: [facility];  Service: Gynecology;  Laterality: N/A;  . CYSTOSCOPY Right 01/18/2018   Procedure: CYSTOSCOPY, RETROGRADE / PYELOGRAM, URETEROSCOPY;  Surgeon: Ceasar Mons, MD;  Location[facility];  Service: Urology;  Laterality:  Right;  . CYSTOSCOPY W/ URETERAL STENT PLACEMENT Right 10/03/2017   Procedure: CYSTOSCOPY WITH RETROGRADE PYELOGRAM/URETERAL STENT PLACEMENT;  Surgeon: Ceasar Mons, MD;  Location: [facility];  Service: Urology;  Laterality: Right;  . CYSTOSCOPY W/ URETERAL STENT PLACEMENT Right 02/22/2018   Procedure: CYSTOSCOPY WITH URETERAL STENT PLACEMENT;  Surgeon: Ceasar Mons, MD;  Location: [facility];  Service: Urology;  Laterality: Right;  . LIPOMA EXCISION  2014   back  . LIPOMA EXCISION N/A 05/21/2015   Procedure: EXCISION LIPOMAS ON ABDOMEN;  Surgeon: Erroll Luna, MD;  Location: [facility];  Service: General;  Laterality: N/A;  . LUMBAR PUNCTURE  09/19/17   [facility], Dr. Curt Bears  . OOPHORECTOMY Left 09/28/2017   Procedure: OOPHORECTOMY;  Surgeon: Bobbye Charleston, MD;  Location: [facility];  Service: Gynecology;  Laterality: Left;  . ROBOTIC ASSISTED LAPAROSCOPIC LYSIS OF ADHESION N/A 09/28/2017   Procedure: ROBOTIC ASSISTED LAPAROSCOPIC LYSIS OF ADHESION;  Surgeon: Bobbye Charleston, MD;  Location: [facility];  Service: Gynecology;  Laterality: N/A;  . ROBOTIC ASSISTED TOTAL HYSTERECTOMY WITH BILATERAL SALPINGO OOPHERECTOMY Bilateral 09/28/2017   Procedure: ROBOTIC ASSISTED TOTAL HYSTERECTOMY WITH BILATERAL SALPINGECTOMY;  Surgeon: Bobbye Charleston, MD;  Location: [facility];  Service: Gynecology;  Laterality: Bilateral;  . ROBOTICALLY ASSISTED LAPAROSCOPIC URETERAL RE-IMPLANTATION Right 02/22/2018   Procedure: ROBOTICALLY ASSISTED LAPAROSCOPIC URETERAL RE-IMPLANTATION;  Surgeon: Ceasar Mons, MD;  Location: [facility];  Service: Urology;  Laterality: Right;  ONLY NEEDS 240 MIN TOTAL FOR ALL PROCEDURES  . TUBAL LIGATION    . WISDOM TOOTH EXTRACTION      Allergies  Allergen Reactions  . Bee Venom Anaphylaxis  . Penicillins Anaphylaxis and Other (See Comments)    Has patient had a PCN reaction causing immediate rash, facial/tongue/throat swelling,  SOB or lightheadedness with hypotension: Yes Has patient had a PCN reaction causing severe rash involving mucus membranes or skin necrosis: No Has patient had a PCN reaction that required hospitalization: Yes Has patient had a PCN reaction occurring within the last 10 years: No If all of the above answers are "NO", then may proceed with Cephalosporin use.   . Mushroom Extract Complex Nausea And Vomiting  . Ciprofloxacin Rash  . Powder Itching, Rash and Other (See Comments)    The powder from latex gloves    Social History   Socioeconomic History  . Marital status: Married    Spouse name: Not on file  . Number of children: Not on file  . Years of education: Not on file  . Highest education level: Not on file  Occupational History  . Not on file  Social Needs  . Financial resource strain: Not on file  . Food insecurity  Worry: Not on file    Inability: Not on file  . Transportation needs    Medical: Not on file    Non-medical: Not on file  Tobacco Use  . Smoking status: Current Some Day Smoker    Packs/day: 0.50    Years: 16.00    Pack years: 8.00    Types: Cigarettes    Start date: 05/01/2014  . Smokeless tobacco: Never Used  Substance and Sexual Activity  . Alcohol use: No    Alcohol/week: 0.0 standard drinks  . Drug use: No  . Sexual activity: Yes    Birth control/protection: None  Lifestyle  . Physical activity    Days per week: Not on file    Minutes per session: Not on file  . Stress: Not on file  Relationships  . Social Herbalist on phone: Not on file    Gets together: Not on file    Attends religious service: Not on file    Active member of club or organization: Not on file    Attends meetings of clubs or organizations: Not on file    Relationship status: Not on file  . Intimate partner violence    Fear of current or ex partner: Not on file    Emotionally abused: Not on file    Physically abused: Not on file    Forced sexual activity: Not  on file  Other Topics Concern  . Not on file  Social History Narrative   Live home with husband and 3 kids.   Education HS grad.  Culinary grad.  Caffeine  8 glasses (sweet tea), some soda's occ.  Working: [employer].      Family History  Problem Relation Age of Onset  . Drug abuse Mother   . Heart disease Maternal Grandmother   . Heart disease Maternal Grandfather     BP (!) 156/90   Temp 98.7 F (37.1 C) (Oral)   Ht 5\' 7"  (1.702 m)   Wt 205 lb (93 kg)   LMP 09/11/2017 (Approximate)   BMI 32.11 kg/m   Review of Systems: See HPI above.     Objective:  Physical Exam:  Gen: NAD, comfortable in exam room  Right knee: Minimal effusion.  No other gross deformity, ecchymoses. Diffuse anterior tenderness to palpation. ROM 0 - 100 degrees with 5/5 strength flexion and extension. Negative ant/post drawers.  Negative valgus/varus testing.  Negative lachman. Negative patellar apprehension.  Pain with mcmurrays and apleys. NVI distally.  Right hip: No deformity. FROM with 5/5 strength. No tenderness to palpation. NVI distally. Negative logroll.   Assessment & Plan:  1. Right knee effusion - Radiographs negative except small effusion.  Aspiration and injection did not help with her knee pain - fluid analysis showed mild increase in WBC count but no crystals, other abnormalities to suggest infection, gout, inflammatory arthropathy (would expect much higher WBC count).  Independently reviewed radiographs of hip and no evidence fracture, other bony abnormality.  Will go ahead with MRI to assess for meniscus tear and refer to ortho if present.  Aleve with short course of oxycodone for pain.  Continue home exercises, quad strengthening.

## 2019-05-30 ENCOUNTER — Other Ambulatory Visit: Payer: Self-pay

## 2019-05-30 ENCOUNTER — Ambulatory Visit
Admission: RE | Admit: 2019-05-30 | Discharge: 2019-05-30 | Disposition: A | Discharge status: Home / Self Care | Payer: Medicaid Other | Source: Ambulatory Visit | Attending: Family Medicine | Admitting: Family Medicine

## 2019-05-30 DIAGNOSIS — M25561 Pain in right knee: Secondary | ICD-10-CM

## 2019-06-04 ENCOUNTER — Other Ambulatory Visit: Payer: Self-pay

## 2019-06-04 ENCOUNTER — Ambulatory Visit: Payer: Medicaid Other | Admitting: Family Medicine

## 2019-06-04 VITALS — BP 140/90 | Ht 67.0 in | Wt 205.0 lb

## 2019-06-04 DIAGNOSIS — M25561 Pain in right knee: Secondary | ICD-10-CM

## 2019-06-04 NOTE — Patient Instructions (Addendum)
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 Sometimes we don't know for sure if you have this second thing until the surgeon looks at it directly during arthroscopy. You will see Dr Noemi Chapel tomorrow at 1045 to discuss arthroscopy. Their address is [address] in the [place] across the street from our office. [phone] is their number In meantime you can take aleve, topical aspercreme, the oxycodone as needed for pain. Continue the knee brace if it provides you with support.

## 2019-06-05 ENCOUNTER — Encounter: Payer: Self-pay | Admitting: Family Medicine

## 2019-06-05 NOTE — Progress Notes (Signed)
Patient returned today to go over her MRI results.  She does have a medial meniscus tear - also noted posteromedial aspect of joint appears to have complex appearing bakers cyst - question if there's an area of pigmented villonodular synovitis given the appearance.  As she's not improving will go ahead with referral to ortho to discuss arthroscopy.

## 2019-06-06 ENCOUNTER — Encounter (HOSPITAL_BASED_OUTPATIENT_CLINIC_OR_DEPARTMENT_OTHER): Payer: Self-pay | Admitting: *Deleted

## 2019-06-06 ENCOUNTER — Other Ambulatory Visit: Payer: Self-pay

## 2019-06-09 ENCOUNTER — Encounter (HOSPITAL_BASED_OUTPATIENT_CLINIC_OR_DEPARTMENT_OTHER): Payer: Self-pay | Admitting: Physician Assistant

## 2019-06-09 DIAGNOSIS — E1142 Type 2 diabetes mellitus with diabetic polyneuropathy: Secondary | ICD-10-CM | POA: Diagnosis present

## 2019-06-09 DIAGNOSIS — S83241A Other tear of medial meniscus, current injury, right knee, initial encounter: Secondary | ICD-10-CM

## 2019-06-09 HISTORY — DX: Other tear of medial meniscus, current injury, right knee, initial encounter: S83.241A

## 2019-06-09 NOTE — H&P (Signed)
[name] is an 38 y.o. female.   Chief Complaint: right knee medial meniscus tear HPI: 30 yof who began having knee pain a few years ago.  Recently she increased her workouts with sumo squats and walking.  Right knee began swelling and catching.  MRI shows medial meniscus tear.    Past Medical History:  Diagnosis Date  . Abdominal lipoma 05/2015  . Abdominal pain, RLQ 10/03/2017  . Anxiety   . Arthritis    lower back  . Diabetes mellitus without complication ([place])    type 2  . DM type 2 with diabetic peripheral neuropathy (HCC)    legs and feet  . GERD (gastroesophageal reflux disease)   . Headache    Migraines  . History of MRSA infection ~ 2006   leg  . History of shingles   . Hx of hydronephrosis    following total hysterectomy surgery ; Right kidney ; see CT abdomen 10-02-17 epic; states " a plug had got damaged during the surgery so thats why they put the stent in"   . Hypertension    under control with med., has been on med. x 6 mos.  . Incontinence of urine in female   . Lumbosacral spondylolysis    see MRI 06-01-17 epic   . Mood swing   . Nocturia   . Optic nerve swelling    seen by Dr Mora Bellman 09-07-17, did lumbar puncture 09-19-17; patient states " they never called me about results ut i have an appt with him in april"; patient also states  " my vision is still blurry and worse when i get the migraines, they think the migraines are causing it"  . Psoriasis     Past Surgical History:  Procedure Laterality Date  . CESAREAN SECTION  06/21/2008; 08/18/2010  . CESAREAN SECTION N/A 07/10/2014   Procedure: REPEAT CESAREAN SECTION;  Surgeon: Daria Pastures, MD;  Location: [facility];  Service: Obstetrics;  Laterality: N/A;  . CHOLECYSTECTOMY  07/10/2008  . CYSTOSCOPY N/A 09/28/2017   Procedure: CYSTOSCOPY;  Surgeon: Bobbye Charleston, MD;  Location: [facility];  Service: Gynecology;  Laterality: N/A;  . CYSTOSCOPY Right 01/18/2018   Procedure: CYSTOSCOPY, RETROGRADE /  PYELOGRAM, URETEROSCOPY;  Surgeon: Ceasar Mons, MD;  [facility];  Service: Urology;  Laterality: Right;  . CYSTOSCOPY W/ URETERAL STENT PLACEMENT Right 10/03/2017   Procedure: CYSTOSCOPY WITH RETROGRADE PYELOGRAM/URETERAL STENT PLACEMENT;  Surgeon: Ceasar Mons, MD;  Location: [facility];  Service: Urology;  Laterality: Right;  . CYSTOSCOPY W/ URETERAL STENT PLACEMENT Right 02/22/2018   Procedure: CYSTOSCOPY WITH URETERAL STENT PLACEMENT;  Surgeon: Ceasar Mons, MD;  Location: [facility];  Service: Urology;  Laterality: Right;  . LIPOMA EXCISION  2014   back  . LIPOMA EXCISION N/A 05/21/2015   Procedure: EXCISION LIPOMAS ON ABDOMEN;  Surgeon: Erroll Luna, MD;  Location: [place];  Service: General;  Laterality: N/A;  . LUMBAR PUNCTURE  09/19/17   [facility], Dr. Curt Bears  . OOPHORECTOMY Left 09/28/2017   Procedure: OOPHORECTOMY;  Surgeon: Bobbye Charleston, MD;  Location: [facility];  Service: Gynecology;  Laterality: Left;  . ROBOTIC ASSISTED LAPAROSCOPIC LYSIS OF ADHESION N/A 09/28/2017   Procedure: ROBOTIC ASSISTED LAPAROSCOPIC LYSIS OF ADHESION;  Surgeon: Bobbye Charleston, MD;  Location: [facility];  Service: Gynecology;  Laterality: N/A;  . ROBOTIC ASSISTED TOTAL HYSTERECTOMY WITH BILATERAL SALPINGO OOPHERECTOMY Bilateral 09/28/2017   Procedure: ROBOTIC ASSISTED TOTAL HYSTERECTOMY WITH BILATERAL SALPINGECTOMY;  Surgeon:  Bobbye Charleston, MD;  Location: [facility];  Service: Gynecology;  Laterality: Bilateral;  . ROBOTICALLY ASSISTED LAPAROSCOPIC URETERAL RE-IMPLANTATION Right 02/22/2018   Procedure: ROBOTICALLY ASSISTED LAPAROSCOPIC URETERAL RE-IMPLANTATION;  Surgeon: Ceasar Mons, MD;  Location: [facility];  Service: Urology;  Laterality: Right;  ONLY NEEDS 240 MIN TOTAL FOR ALL PROCEDURES  . TUBAL LIGATION    . WISDOM TOOTH EXTRACTION      Family History  Problem Relation Age of Onset  . Drug abuse  Mother   . Heart disease Maternal Grandmother   . Heart disease Maternal Grandfather    Social History:  reports that she has been smoking cigarettes. She started smoking about 5 years ago. She has a 8.00 pack-year smoking history. She has never used smokeless tobacco. She reports that she does not drink alcohol or use drugs.  Allergies:  Allergies  Allergen Reactions  . Bee Venom Anaphylaxis  . Penicillins Anaphylaxis and Other (See Comments)    Has patient had a PCN reaction causing immediate rash, facial/tongue/throat swelling, SOB or lightheadedness with hypotension: Yes Has patient had a PCN reaction causing severe rash involving mucus membranes or skin necrosis: No Has patient had a PCN reaction that required hospitalization: Yes Has patient had a PCN reaction occurring within the last 10 years: No If all of the above answers are "NO", then may proceed with Cephalosporin use.   . Mushroom Extract Complex Nausea And Vomiting  . Ciprofloxacin Rash  . Powder Itching, Rash and Other (See Comments)    The powder from latex gloves    No medications prior to admission.    No results found for this or any previous visit (from the past 48 hour(s)). No results found.  Review of Systems  Constitutional: Negative.   HENT: Negative.   Eyes: Negative.   Respiratory: Negative.   Cardiovascular: Negative.   Gastrointestinal: Negative.   Genitourinary: Negative.   Musculoskeletal: Positive for back pain and joint pain.  Skin: Negative.   Neurological: Negative.   Endo/Heme/Allergies: Negative.   Psychiatric/Behavioral: Negative.     Height 5\' 7"  (1.702 m), weight 92.1 kg, last menstrual period 09/11/2017. Physical Exam  Constitutional: She is oriented to person, place, and time. She appears well-developed and well-nourished.  HENT:  Head: Normocephalic and atraumatic.  Mouth/Throat: Oropharynx is clear and moist.  Eyes: Pupils are equal, round, and reactive to light.  Neck:  Neck supple.  Cardiovascular: Normal rate.  Respiratory: Effort normal.  GI: Soft.  Genitourinary:    Genitourinary Comments: Not pertinent to current symptomatology therefore not examined.   Musculoskeletal:     Comments: Right knee swollen with boggy synovitis.  Medial and lateral joint line pain.  Positive mcmurrays neg lachman.  DNVI  Left knee has full range of  Motion with 1+ criep 1+ synovitis and mild pain.  DNVI  Neurological: She is alert and oriented to person, place, and time.  Skin: Skin is warm and dry.  Psychiatric: She has a normal mood and affect. Her behavior is normal.     Assessment Principal Problem:   Acute medial meniscus tear of right knee Active Problems:   Hypertension   Obesity   Diabetes type 2, controlled (HCC)   Diabetic neuropathy (HCC)   Anxiety state   Acute pain of right knee   DM type 2 with diabetic peripheral neuropathy ([place])   Plan Right knee arthroscopy with partial meniscectomy with possible synovial biopsy for PVNS.  The risks, benefits, and possible complications of the procedure were  discussed in detail with the patient.  The patient is without question.  Baneen Wieseler J Alverna Fawley, PA-C 06/09/2019, 1:09 PM

## 2019-06-09 NOTE — H&P (View-Only) (Signed)
[name] is an 38 y.o. female.   Chief Complaint: right knee medial meniscus tear HPI: 35 yof who began having knee pain a few years ago.  Recently she increased her workouts with sumo squats and walking.  Right knee began swelling and catching.  MRI shows medial meniscus tear.    Past Medical History:  Diagnosis Date  . Abdominal lipoma 05/2015  . Abdominal pain, RLQ 10/03/2017  . Anxiety   . Arthritis    lower back  . Diabetes mellitus without complication ([place])    type 2  . DM type 2 with diabetic peripheral neuropathy (HCC)    legs and feet  . GERD (gastroesophageal reflux disease)   . Headache    Migraines  . History of MRSA infection ~ 2006   leg  . History of shingles   . Hx of hydronephrosis    following total hysterectomy surgery ; Right kidney ; see CT abdomen 10-02-17 epic; states " a plug had got damaged during the surgery so thats why they put the stent in"   . Hypertension    under control with med., has been on med. x 6 mos.  . Incontinence of urine in female   . Lumbosacral spondylolysis    see MRI 06-01-17 epic   . Mood swing   . Nocturia   . Optic nerve swelling    seen by Dr Mora Bellman 09-07-17, did lumbar puncture 09-19-17; patient states " they never called me about results ut i have an appt with him in april"; patient also states  " my vision is still blurry and worse when i get the migraines, they think the migraines are causing it"  . Psoriasis     Past Surgical History:  Procedure Laterality Date  . CESAREAN SECTION  06/21/2008; 08/18/2010  . CESAREAN SECTION N/A 07/10/2014   Procedure: REPEAT CESAREAN SECTION;  Surgeon: Daria Pastures, MD;  Location: [facility];  Service: Obstetrics;  Laterality: N/A;  . CHOLECYSTECTOMY  07/10/2008  . CYSTOSCOPY N/A 09/28/2017   Procedure: CYSTOSCOPY;  Surgeon: Bobbye Charleston, MD;  Location: [facility];  Service: Gynecology;  Laterality: N/A;  . CYSTOSCOPY Right 01/18/2018   Procedure: CYSTOSCOPY, RETROGRADE /  PYELOGRAM, URETEROSCOPY;  Surgeon: Ceasar Mons, MD;  Location: [facility];  Service: Urology;  Laterality: Right;  . CYSTOSCOPY W/ URETERAL STENT PLACEMENT Right 10/03/2017   Procedure: CYSTOSCOPY WITH RETROGRADE PYELOGRAM/URETERAL STENT PLACEMENT;  Surgeon: Ceasar Mons, MD;  Location: [facility];  Service: Urology;  Laterality: Right;  . CYSTOSCOPY W/ URETERAL STENT PLACEMENT Right 02/22/2018   Procedure: CYSTOSCOPY WITH URETERAL STENT PLACEMENT;  Surgeon: Ceasar Mons, MD;  Location: [facility];  Service: Urology;  Laterality: Right;  . LIPOMA EXCISION  2014   back  . LIPOMA EXCISION N/A 05/21/2015   Procedure: EXCISION LIPOMAS ON ABDOMEN;  Surgeon: Erroll Luna, MD;  Location: [place];  Service: General;  Laterality: N/A;  . LUMBAR PUNCTURE  09/19/17   [facility], Dr. Curt Bears  . OOPHORECTOMY Left 09/28/2017   Procedure: OOPHORECTOMY;  Surgeon: Bobbye Charleston, MD;  Location: [facility];  Service: Gynecology;  Laterality: Left;  . ROBOTIC ASSISTED LAPAROSCOPIC LYSIS OF ADHESION N/A 09/28/2017   Procedure: ROBOTIC ASSISTED LAPAROSCOPIC LYSIS OF ADHESION;  Surgeon: Bobbye Charleston, MD;  Location: [facility];  Service: Gynecology;  Laterality: N/A;  . ROBOTIC ASSISTED TOTAL HYSTERECTOMY WITH BILATERAL SALPINGO OOPHERECTOMY Bilateral 09/28/2017   Procedure: ROBOTIC ASSISTED TOTAL HYSTERECTOMY WITH BILATERAL SALPINGECTOMY;  Surgeon:  Bobbye Charleston, MD;  Location: [facility];  Service: Gynecology;  Laterality: Bilateral;  . ROBOTICALLY ASSISTED LAPAROSCOPIC URETERAL RE-IMPLANTATION Right 02/22/2018   Procedure: ROBOTICALLY ASSISTED LAPAROSCOPIC URETERAL RE-IMPLANTATION;  Surgeon: Ceasar Mons, MD;  Location: [facility];  Service: Urology;  Laterality: Right;  ONLY NEEDS 240 MIN TOTAL FOR ALL PROCEDURES  . TUBAL LIGATION    . WISDOM TOOTH EXTRACTION      Family History  Problem Relation Age of Onset  . Drug abuse  Mother   . Heart disease Maternal Grandmother   . Heart disease Maternal Grandfather    Social History:  reports that she has been smoking cigarettes. She started smoking about 5 years ago. She has a 8.00 pack-year smoking history. She has never used smokeless tobacco. She reports that she does not drink alcohol or use drugs.  Allergies:  Allergies  Allergen Reactions  . Bee Venom Anaphylaxis  . Penicillins Anaphylaxis and Other (See Comments)    Has patient had a PCN reaction causing immediate rash, facial/tongue/throat swelling, SOB or lightheadedness with hypotension: Yes Has patient had a PCN reaction causing severe rash involving mucus membranes or skin necrosis: No Has patient had a PCN reaction that required hospitalization: Yes Has patient had a PCN reaction occurring within the last 10 years: No If all of the above answers are "NO", then may proceed with Cephalosporin use.   . Mushroom Extract Complex Nausea And Vomiting  . Ciprofloxacin Rash  . Powder Itching, Rash and Other (See Comments)    The powder from latex gloves    No medications prior to admission.    No results found for this or any previous visit (from the past 48 hour(s)). No results found.  Review of Systems  Constitutional: Negative.   HENT: Negative.   Eyes: Negative.   Respiratory: Negative.   Cardiovascular: Negative.   Gastrointestinal: Negative.   Genitourinary: Negative.   Musculoskeletal: Positive for back pain and joint pain.  Skin: Negative.   Neurological: Negative.   Endo/Heme/Allergies: Negative.   Psychiatric/Behavioral: Negative.     Height 5\' 7"  (1.702 m), weight 92.1 kg, last menstrual period 09/11/2017. Physical Exam  Constitutional: She is oriented to person, place, and time. She appears well-developed and well-nourished.  HENT:  Head: Normocephalic and atraumatic.  Mouth/Throat: Oropharynx is clear and moist.  Eyes: Pupils are equal, round, and reactive to light.  Neck:  Neck supple.  Cardiovascular: Normal rate.  Respiratory: Effort normal.  GI: Soft.  Genitourinary:    Genitourinary Comments: Not pertinent to current symptomatology therefore not examined.   Musculoskeletal:     Comments: Right knee swollen with boggy synovitis.  Medial and lateral joint line pain.  Positive mcmurrays neg lachman.  DNVI  Left knee has full range of  Motion with 1+ criep 1+ synovitis and mild pain.  DNVI  Neurological: She is alert and oriented to person, place, and time.  Skin: Skin is warm and dry.  Psychiatric: She has a normal mood and affect. Her behavior is normal.     Assessment Principal Problem:   Acute medial meniscus tear of right knee Active Problems:   Hypertension   Obesity   Diabetes type 2, controlled (HCC)   Diabetic neuropathy (HCC)   Anxiety state   Acute pain of right knee   DM type 2 with diabetic peripheral neuropathy ([place])   Plan Right knee arthroscopy with partial meniscectomy with possible synovial biopsy for PVNS.  The risks, benefits, and possible complications of the procedure were  discussed in detail with the patient.  The patient is without question.  Bates [place]lington J Refugio Vandevoorde, PA-C 06/09/2019, 1:09 PM

## 2019-06-11 ENCOUNTER — Other Ambulatory Visit (HOSPITAL_COMMUNITY)
Admission: RE | Admit: 2019-06-11 | Discharge: 2019-06-11 | Disposition: A | Discharge status: Home / Self Care | Payer: Medicaid Other | Source: Ambulatory Visit | Attending: Orthopedic Surgery | Admitting: Orthopedic Surgery

## 2019-06-11 ENCOUNTER — Other Ambulatory Visit: Payer: Self-pay

## 2019-06-11 ENCOUNTER — Encounter (HOSPITAL_BASED_OUTPATIENT_CLINIC_OR_DEPARTMENT_OTHER)
Admission: RE | Admit: 2019-06-11 | Discharge: 2019-06-11 | Disposition: A | Discharge status: Home / Self Care | Payer: Medicaid Other | Source: Ambulatory Visit | Attending: Orthopedic Surgery | Admitting: Orthopedic Surgery

## 2019-06-11 DIAGNOSIS — Z01818 Encounter for other preprocedural examination: Secondary | ICD-10-CM | POA: Insufficient documentation

## 2019-06-11 DIAGNOSIS — Z20828 Contact with and (suspected) exposure to other viral communicable diseases: Secondary | ICD-10-CM | POA: Insufficient documentation

## 2019-06-11 LAB — SARS CORONAVIRUS 2 (TAT 6-24 HRS): SARS Coronavirus 2: NEGATIVE

## 2019-06-11 NOTE — Progress Notes (Signed)

## 2019-06-14 ENCOUNTER — Ambulatory Visit (HOSPITAL_BASED_OUTPATIENT_CLINIC_OR_DEPARTMENT_OTHER): Admission: RE | Admit: 2019-06-14 | Payer: Medicaid Other | Source: Home / Self Care | Admitting: Orthopedic Surgery

## 2019-06-19 ENCOUNTER — Other Ambulatory Visit: Payer: Self-pay

## 2019-06-19 ENCOUNTER — Encounter (HOSPITAL_BASED_OUTPATIENT_CLINIC_OR_DEPARTMENT_OTHER): Payer: Self-pay | Admitting: *Deleted

## 2019-06-22 ENCOUNTER — Other Ambulatory Visit (HOSPITAL_COMMUNITY)
Admission: RE | Admit: 2019-06-22 | Discharge: 2019-06-22 | Disposition: A | Discharge status: Home / Self Care | Payer: Medicaid Other | Source: Ambulatory Visit | Attending: Orthopedic Surgery | Admitting: Orthopedic Surgery

## 2019-06-22 DIAGNOSIS — Z20828 Contact with and (suspected) exposure to other viral communicable diseases: Secondary | ICD-10-CM | POA: Diagnosis not present

## 2019-06-22 DIAGNOSIS — Z01812 Encounter for preprocedural laboratory examination: Secondary | ICD-10-CM | POA: Diagnosis present

## 2019-06-22 LAB — SARS CORONAVIRUS 2 (TAT 6-24 HRS): SARS Coronavirus 2: NEGATIVE

## 2019-06-26 ENCOUNTER — Encounter (HOSPITAL_BASED_OUTPATIENT_CLINIC_OR_DEPARTMENT_OTHER)
Admission: RE | Disposition: A | Discharge status: Home / Self Care | Payer: Self-pay | Source: Home / Self Care | Attending: Orthopedic Surgery

## 2019-06-26 ENCOUNTER — Ambulatory Visit (HOSPITAL_BASED_OUTPATIENT_CLINIC_OR_DEPARTMENT_OTHER): Payer: Medicaid Other | Admitting: Certified Registered"

## 2019-06-26 ENCOUNTER — Ambulatory Visit (HOSPITAL_BASED_OUTPATIENT_CLINIC_OR_DEPARTMENT_OTHER)
Admission: RE | Admit: 2019-06-26 | Discharge: 2019-06-26 | Disposition: A | Discharge status: Home / Self Care | Payer: Medicaid Other | Attending: Orthopedic Surgery | Admitting: Orthopedic Surgery

## 2019-06-26 ENCOUNTER — Encounter (HOSPITAL_BASED_OUTPATIENT_CLINIC_OR_DEPARTMENT_OTHER): Payer: Self-pay | Admitting: *Deleted

## 2019-06-26 ENCOUNTER — Other Ambulatory Visit: Payer: Self-pay

## 2019-06-26 DIAGNOSIS — M659 Synovitis and tenosynovitis, unspecified: Secondary | ICD-10-CM | POA: Diagnosis not present

## 2019-06-26 DIAGNOSIS — I10 Essential (primary) hypertension: Secondary | ICD-10-CM | POA: Insufficient documentation

## 2019-06-26 DIAGNOSIS — F17200 Nicotine dependence, unspecified, uncomplicated: Secondary | ICD-10-CM

## 2019-06-26 DIAGNOSIS — Z880 Allergy status to penicillin: Secondary | ICD-10-CM | POA: Diagnosis not present

## 2019-06-26 DIAGNOSIS — Z6832 Body mass index (BMI) 32.0-32.9, adult: Secondary | ICD-10-CM | POA: Insufficient documentation

## 2019-06-26 DIAGNOSIS — E6609 Other obesity due to excess calories: Secondary | ICD-10-CM

## 2019-06-26 DIAGNOSIS — F17210 Nicotine dependence, cigarettes, uncomplicated: Secondary | ICD-10-CM | POA: Diagnosis not present

## 2019-06-26 DIAGNOSIS — Z91030 Bee allergy status: Secondary | ICD-10-CM | POA: Diagnosis not present

## 2019-06-26 DIAGNOSIS — S83281A Other tear of lateral meniscus, current injury, right knee, initial encounter: Secondary | ICD-10-CM | POA: Diagnosis present

## 2019-06-26 DIAGNOSIS — L409 Psoriasis, unspecified: Secondary | ICD-10-CM

## 2019-06-26 DIAGNOSIS — S83241A Other tear of medial meniscus, current injury, right knee, initial encounter: Secondary | ICD-10-CM | POA: Insufficient documentation

## 2019-06-26 DIAGNOSIS — E1142 Type 2 diabetes mellitus with diabetic polyneuropathy: Secondary | ICD-10-CM | POA: Insufficient documentation

## 2019-06-26 DIAGNOSIS — E669 Obesity, unspecified: Secondary | ICD-10-CM | POA: Diagnosis not present

## 2019-06-26 DIAGNOSIS — M47896 Other spondylosis, lumbar region: Secondary | ICD-10-CM | POA: Diagnosis not present

## 2019-06-26 DIAGNOSIS — S83241D Other tear of medial meniscus, current injury, right knee, subsequent encounter: Secondary | ICD-10-CM

## 2019-06-26 DIAGNOSIS — M2241 Chondromalacia patellae, right knee: Secondary | ICD-10-CM | POA: Insufficient documentation

## 2019-06-26 DIAGNOSIS — Z888 Allergy status to other drugs, medicaments and biological substances status: Secondary | ICD-10-CM | POA: Insufficient documentation

## 2019-06-26 DIAGNOSIS — Z8614 Personal history of Methicillin resistant Staphylococcus aureus infection: Secondary | ICD-10-CM | POA: Diagnosis not present

## 2019-06-26 DIAGNOSIS — X501XXA Overexertion from prolonged static or awkward postures, initial encounter: Secondary | ICD-10-CM | POA: Insufficient documentation

## 2019-06-26 DIAGNOSIS — F411 Generalized anxiety disorder: Secondary | ICD-10-CM

## 2019-06-26 DIAGNOSIS — M25561 Pain in right knee: Secondary | ICD-10-CM

## 2019-06-26 HISTORY — PX: KNEE ARTHROSCOPY WITH MEDIAL MENISECTOMY: SHX5651

## 2019-06-26 SURGERY — ARTHROSCOPY, KNEE, WITH MEDIAL MENISCECTOMY
Anesthesia: General | Site: Knee | Laterality: Right

## 2019-06-26 SURGERY — ARTHROSCOPY, KNEE, WITH MEDIAL MENISCECTOMY
Anesthesia: Choice | Laterality: Right

## 2019-06-26 MED ORDER — POVIDONE-IODINE 7.5 % EX SOLN: Freq: Once | CUTANEOUS | Status: DC

## 2019-06-26 MED ORDER — SODIUM CHLORIDE 0.9 % IR SOLN
Status: DC | PRN
  Administered 2019-06-26: 4000 mL

## 2019-06-26 MED ORDER — DEXAMETHASONE SODIUM PHOSPHATE 10 MG/ML IJ SOLN: 8.0000 mg | Freq: Once | INTRAMUSCULAR | Status: DC

## 2019-06-26 MED ORDER — BUPIVACAINE-EPINEPHRINE (PF) 0.25% -1:200000 IJ SOLN
INTRAMUSCULAR | Status: AC
  Filled 2019-06-26: qty 30

## 2019-06-26 MED ORDER — METOCLOPRAMIDE HCL 5 MG/ML IJ SOLN: 10.0000 mg | Freq: Once | INTRAMUSCULAR | Status: DC | PRN

## 2019-06-26 MED ORDER — EPINEPHRINE PF 1 MG/ML IJ SOLN
INTRAMUSCULAR | Status: AC
  Filled 2019-06-26: qty 2

## 2019-06-26 MED ORDER — LIDOCAINE HCL (CARDIAC) PF 100 MG/5ML IV SOSY
PREFILLED_SYRINGE | INTRAVENOUS | Status: DC | PRN
  Administered 2019-06-26: 50 mg via INTRAVENOUS

## 2019-06-26 MED ORDER — VANCOMYCIN HCL IN DEXTROSE 1-5 GM/200ML-% IV SOLN
1000.0000 mg | INTRAVENOUS | Status: AC
  Administered 2019-06-26 (×2): 1000 mg via INTRAVENOUS

## 2019-06-26 MED ORDER — FENTANYL CITRATE (PF) 100 MCG/2ML IJ SOLN
25.0000 ug | INTRAMUSCULAR | Status: DC | PRN
  Administered 2019-06-26 (×2): 50 ug via INTRAVENOUS

## 2019-06-26 MED ORDER — HYDROCODONE-ACETAMINOPHEN 5-325 MG PO TABS: 1.0000 | ORAL_TABLET | ORAL | 0 refills | Status: DC | PRN

## 2019-06-26 MED ORDER — BUPIVACAINE-EPINEPHRINE 0.25% -1:200000 IJ SOLN
INTRAMUSCULAR | Status: DC | PRN
  Administered 2019-06-26: 20 mL

## 2019-06-26 MED ORDER — POVIDONE-IODINE 10 % EX SWAB: 2.0000 "application " | Freq: Once | CUTANEOUS | Status: DC

## 2019-06-26 MED ORDER — LACTATED RINGERS IV SOLN
INTRAVENOUS | Status: DC
  Administered 2019-06-26: 13:00:00 via INTRAVENOUS

## 2019-06-26 MED ORDER — OXYCODONE HCL 5 MG PO TABS
5.0000 mg | ORAL_TABLET | Freq: Once | ORAL | Status: AC
  Administered 2019-06-26: 5 mg via ORAL

## 2019-06-26 MED ORDER — PROPOFOL 10 MG/ML IV BOLUS
INTRAVENOUS | Status: DC | PRN
  Administered 2019-06-26: 200 mg via INTRAVENOUS

## 2019-06-26 MED ORDER — EPHEDRINE SULFATE 50 MG/ML IJ SOLN
INTRAMUSCULAR | Status: DC | PRN
  Administered 2019-06-26: 10 mg via INTRAVENOUS

## 2019-06-26 MED ORDER — CHLORHEXIDINE GLUCONATE 4 % EX LIQD: 60.0000 mL | Freq: Once | CUTANEOUS | Status: DC

## 2019-06-26 MED ORDER — GLYCOPYRROLATE 0.2 MG/ML IJ SOLN
INTRAMUSCULAR | Status: DC | PRN
  Administered 2019-06-26: 0.2 mg via INTRAVENOUS

## 2019-06-26 MED ORDER — MIDAZOLAM HCL 2 MG/2ML IJ SOLN
INTRAMUSCULAR | Status: AC
  Filled 2019-06-26: qty 2

## 2019-06-26 MED ORDER — OXYCODONE HCL 5 MG PO TABS
ORAL_TABLET | ORAL | Status: AC
  Filled 2019-06-26: qty 1

## 2019-06-26 MED ORDER — FENTANYL CITRATE (PF) 100 MCG/2ML IJ SOLN
INTRAMUSCULAR | Status: AC
  Filled 2019-06-26: qty 2

## 2019-06-26 MED ORDER — MIDAZOLAM HCL 5 MG/5ML IJ SOLN
INTRAMUSCULAR | Status: DC | PRN
  Administered 2019-06-26: 2 mg via INTRAVENOUS

## 2019-06-26 MED ORDER — LACTATED RINGERS IV SOLN
INTRAVENOUS | Status: DC
  Administered 2019-06-26: 11:00:00 via INTRAVENOUS

## 2019-06-26 MED ORDER — APIXABAN 2.5 MG PO TABS: ORAL_TABLET | ORAL | 0 refills | Status: DC

## 2019-06-26 MED ORDER — ONDANSETRON HCL 4 MG/2ML IJ SOLN
INTRAMUSCULAR | Status: DC | PRN
  Administered 2019-06-26: 4 mg via INTRAVENOUS

## 2019-06-26 MED ORDER — FENTANYL CITRATE (PF) 100 MCG/2ML IJ SOLN
INTRAMUSCULAR | Status: DC | PRN
  Administered 2019-06-26: 100 ug via INTRAVENOUS

## 2019-06-26 MED ORDER — DEXAMETHASONE SODIUM PHOSPHATE 4 MG/ML IJ SOLN
INTRAMUSCULAR | Status: DC | PRN
  Administered 2019-06-26: 8 mg via INTRAVENOUS

## 2019-06-26 MED ORDER — VANCOMYCIN HCL IN DEXTROSE 1-5 GM/200ML-% IV SOLN
INTRAVENOUS | Status: AC
  Filled 2019-06-26: qty 200

## 2019-06-26 MED ORDER — MEPERIDINE HCL 25 MG/ML IJ SOLN: 6.2500 mg | INTRAMUSCULAR | Status: DC | PRN

## 2019-06-26 SURGICAL SUPPLY — 41 items
BLADE EXCALIBUR 4.0X13 (MISCELLANEOUS) ×2 IMPLANT
BNDG COHESIVE 4X5 TAN STRL (GAUZE/BANDAGES/DRESSINGS) IMPLANT
BNDG ELASTIC 6X5.8 VLCR STR LF (GAUZE/BANDAGES/DRESSINGS) ×2 IMPLANT
COVER WAND RF STERILE (DRAPES) IMPLANT
DISSECTOR  3.8MM X 13CM (MISCELLANEOUS) ×2
DISSECTOR 3.8MM X 13CM (MISCELLANEOUS) ×2 IMPLANT
DISSECTOR 4.0MM X 13CM (MISCELLANEOUS) ×2 IMPLANT
DRAPE ARTHROSCOPY W/POUCH 90 (DRAPES) ×2 IMPLANT
DURAPREP 26ML APPLICATOR (WOUND CARE) ×2 IMPLANT
EXCALIBUR 3.8MM X 13CM (MISCELLANEOUS) IMPLANT
GAUZE SPONGE 4X4 12PLY STRL (GAUZE/BANDAGES/DRESSINGS) ×2 IMPLANT
GAUZE XEROFORM 1X8 LF (GAUZE/BANDAGES/DRESSINGS) ×2 IMPLANT
GLOVE BIO SURGEON STRL SZ7 (GLOVE) ×2 IMPLANT
GLOVE BIOGEL PI IND STRL 7.0 (GLOVE) ×1 IMPLANT
GLOVE BIOGEL PI IND STRL 7.5 (GLOVE) ×1 IMPLANT
GLOVE BIOGEL PI INDICATOR 7.0 (GLOVE) ×1
GLOVE BIOGEL PI INDICATOR 7.5 (GLOVE) ×1
GLOVE SS BIOGEL STRL SZ 7.5 (GLOVE) ×1 IMPLANT
GLOVE SUPERSENSE BIOGEL SZ 7.5 (GLOVE) ×1
GOWN STRL REUS W/ TWL LRG LVL3 (GOWN DISPOSABLE) ×2 IMPLANT
GOWN STRL REUS W/ TWL XL LVL3 (GOWN DISPOSABLE) ×1 IMPLANT
GOWN STRL REUS W/TWL LRG LVL3 (GOWN DISPOSABLE) ×2
GOWN STRL REUS W/TWL XL LVL3 (GOWN DISPOSABLE) ×1
KNEE WRAP E Z 3 GEL PACK (MISCELLANEOUS) ×2 IMPLANT
MANIFOLD NEPTUNE II (INSTRUMENTS) ×2 IMPLANT
NDL SAFETY ECLIPSE 18X1.5 (NEEDLE) ×2 IMPLANT
NEEDLE HYPO 18GX1.5 SHARP (NEEDLE) ×2
NEEDLE HYPO 22GX1.5 SAFETY (NEEDLE) ×2 IMPLANT
PACK ARTHROSCOPY DSU (CUSTOM PROCEDURE TRAY) ×2 IMPLANT
PACK BASIN DAY SURGERY FS (CUSTOM PROCEDURE TRAY) ×2 IMPLANT
PAD ALCOHOL SWAB (MISCELLANEOUS) IMPLANT
PENCIL BUTTON HOLSTER BLD 10FT (ELECTRODE) ×2 IMPLANT
PILLOW KNEE EXTENSION 0 DEG (MISCELLANEOUS) ×2 IMPLANT
PORT APPOLLO RF 90DEGREE MULTI (SURGICAL WAND) ×2 IMPLANT
SUT ETHILON 4 0 PS 2 18 (SUTURE) ×2 IMPLANT
SUT PROLENE 3 0 PS 2 (SUTURE) IMPLANT
SYR 20ML LL LF (SYRINGE) ×2 IMPLANT
SYR 5ML LL (SYRINGE) ×2 IMPLANT
TOWEL GREEN STERILE FF (TOWEL DISPOSABLE) ×2 IMPLANT
TUBING ARTHROSCOPY IRRIG 16FT (MISCELLANEOUS) ×2 IMPLANT
WATER STERILE IRR 1000ML POUR (IV SOLUTION) ×2 IMPLANT

## 2019-06-26 NOTE — Transfer of Care (Signed)
Immediate Anesthesia Transfer of Care Note  Patient: [name]  Procedure(s) Performed: KNEE ARTHROSCOPY WITH PARTIAL MEDIAL MENISECTOMY, REMOVAL LOOSE BODY (Right Knee)  Patient Location: PACU  Anesthesia Type:General  Level of Consciousness: awake, alert  and drowsy  Airway & Oxygen Therapy: Patient Spontanous Breathing and Patient connected to face mask oxygen  Post-op Assessment: Report given to RN and Post -op Vital signs reviewed and stable  Post vital signs: Reviewed and stable  Last Vitals:  Vitals Value Taken Time  BP    Temp    Pulse 78 06/26/19 1248  Resp 18 06/26/19 1248  SpO2 94 % 06/26/19 1248  Vitals shown include unvalidated device data.  Last Pain:  Vitals:   06/26/19 1050  PainSc: 6       Patients Stated Pain Goal: 6 ([id number])  Complications: No apparent anesthesia complications

## 2019-06-26 NOTE — Anesthesia Postprocedure Evaluation (Signed)
Anesthesia Post Note  Patient: Lenayah Lantzer Lindseth  Procedure(s) Performed: KNEE ARTHROSCOPY WITH PARTIAL MEDIAL MENISECTOMY, REMOVAL LOOSE BODY (Right Knee)     Patient location during evaluation: PACU Anesthesia Type: General Level of consciousness: awake and alert Pain management: pain level controlled Vital Signs Assessment: post-procedure vital signs reviewed and stable Respiratory status: spontaneous breathing, nonlabored ventilation, respiratory function stable and patient connected to nasal cannula oxygen Cardiovascular status: blood pressure returned to baseline and stable Postop Assessment: no apparent nausea or vomiting Anesthetic complications: no    Last Vitals:  Vitals:   06/26/19 1336 06/26/19 1430  BP:  128/86  Pulse: (!) 54 (!) 58  Resp: 17 18  Temp:  36.7 C  SpO2: 100% 98%    Last Pain:  Vitals:   06/26/19 1430  PainSc: 3                  Montez Hageman

## 2019-06-26 NOTE — Interval H&P Note (Signed)
History and Physical Interval Note:  06/26/2019 10:21 AM  [name]  has presented today for surgery, with the diagnosis of RIGHT KNEE MEDIAL MENISCUS TEAR, RIGHT KNEE CHONDROMALACIA PATELLA.  The various methods of treatment have been discussed with the patient and family. After consideration of risks, benefits and other options for treatment, the patient has consented to  Procedure(s): KNEE ARTHROSCOPY WITH MEDIAL MENISECTOMY, CHONDROPLASTY (Right) as a surgical intervention.  The patient's history has been reviewed, patient examined, no change in status, stable for surgery.  I have reviewed the patient's chart and labs.  Questions were answered to the patient's satisfaction.     Lorn Junes

## 2019-06-26 NOTE — Anesthesia Procedure Notes (Signed)
Procedure Name: LMA Insertion Date/Time: 06/26/2019 12:03 PM Performed by: Willa Frater, CRNA Pre-anesthesia Checklist: Patient identified, Emergency Drugs available, Suction available and Patient being monitored Patient Re-evaluated:Patient Re-evaluated prior to induction Oxygen Delivery Method: Circle system utilized Preoxygenation: Pre-oxygenation with 100% oxygen Induction Type: IV induction Ventilation: Mask ventilation without difficulty LMA: LMA inserted LMA Size: 4.0 Number of attempts: 1 Airway Equipment and Method: Bite block Placement Confirmation: positive ETCO2 Tube secured with: Tape Dental Injury: Teeth and Oropharynx as per pre-operative assessment

## 2019-06-26 NOTE — Discharge Instructions (Signed)

## 2019-06-26 NOTE — Anesthesia Preprocedure Evaluation (Signed)
Anesthesia Evaluation  Patient identified by MRN, date of birth, ID band Patient awake    Reviewed: Allergy & Precautions, NPO status , Patient's Chart, lab work & pertinent test results  Airway Mallampati: II  TM Distance: >3 FB Neck ROM: Full    Dental no notable dental hx. (+) Teeth Intact   Pulmonary Current Smoker and Patient abstained from smoking.,    breath sounds clear to auscultation       Cardiovascular hypertension, Pt. on medications Normal cardiovascular exam Rhythm:Regular Rate:Normal     Neuro/Psych  Headaches, PSYCHIATRIC DISORDERS Anxiety Diabetic peripheral neuropathy  Neuromuscular disease    GI/Hepatic Neg liver ROS, GERD  Medicated and Controlled,  Endo/Other  diabetes, Well Controlled, Type 2, Oral Hypoglycemic AgentsObesity Hyperlipidemia  Renal/GU negative Renal ROS   Ureterovaginal fistula    Musculoskeletal  (+) Arthritis , Osteoarthritis,    Abdominal (+) + obese,   Peds  Hematology  (+) anemia ,   Anesthesia Other Findings   Reproductive/Obstetrics                             Anesthesia Physical  Anesthesia Plan  ASA: II  Anesthesia Plan: General   Post-op Pain Management:    Induction: Intravenous  PONV Risk Score and Plan: 4 or greater and Scopolamine patch - Pre-op, Midazolam, Dexamethasone, Ondansetron and Treatment may vary due to age or medical condition  Airway Management Planned: LMA  Additional Equipment:   Intra-op Plan:   Post-operative Plan:   Informed Consent: I have reviewed the patients History and Physical, chart, labs and discussed the procedure including the risks, benefits and alternatives for the proposed anesthesia with the patient or authorized representative who has indicated his/her understanding and acceptance.     Dental advisory given  Plan Discussed with: CRNA, Anesthesiologist and Surgeon  Anesthesia Plan  Comments:         Anesthesia Quick Evaluation

## 2019-06-27 ENCOUNTER — Encounter: Payer: Self-pay | Admitting: Cardiology

## 2019-06-27 NOTE — Progress Notes (Signed)
Cardiology Office Note   Date:  06/28/2019   ID:  [name], DOB 1980/12/05, MRN [id number]  PCP:  Nuala Alpha, DO  Cardiologist:   No primary care provider on file. Referring:  Courtney Saas, MD   Chief Complaint  Patient presents with  . Pre-op Exam      History of Present Illness: [name] is a 38 y.o. female who is referred by Courtney Saas, MD for evaluation of an abnormal EKG.  She was actually seen in the preop but she is now had surgery.  The EKG demonstrates poor anterior R wave progression.  She has not had any prior cardiac surgery.  She denies any chest pressure, neck or arm discomfort.  She is had no palpitations, presyncope or syncope.  She has been physically active with 3 young children.  She injured her knee starting an exercise program.  She did not have any cardiovascular limitations with this.   Past Medical History:  Diagnosis Date  . Abdominal lipoma 05/2015  . Abdominal pain, RLQ 10/03/2017  . Acute medial meniscus tear of right knee 06/09/2019  . Anxiety   . Arthritis    lower back  . DM type 2 with diabetic peripheral neuropathy (HCC)    Borderline  . GERD (gastroesophageal reflux disease)   . Headache    Migraines  . History of MRSA infection ~ 2006   leg  . History of shingles   . Hx of hydronephrosis    following total hysterectomy surgery ; Right kidney ; see CT abdomen 10-02-17 epic; states " a plug had got damaged during the surgery so thats why they put the stent in"   . Hypertension    under control with med., has been on med. x 6 mos.  . Lumbosacral spondylolysis    see MRI 06-01-17 epic   . Mood swing   . Optic nerve swelling    seen by Dr Mora Bellman 09-07-17, did lumbar puncture 09-19-17; patient states " they never called me about results ut i have an appt with him in april"; patient also states  " my vision is still blurry and worse when i get the migraines, they think the migraines are causing it"  . Psoriasis      Past Surgical History:  Procedure Laterality Date  . CESAREAN SECTION  06/21/2008; 08/18/2010  . CESAREAN SECTION N/A 07/10/2014   Procedure: REPEAT CESAREAN SECTION;  Surgeon: Courtney Pastures, MD;  Location: [facility];  Service: Obstetrics;  Laterality: N/A;  . CHOLECYSTECTOMY  07/10/2008  . CYSTOSCOPY N/A 09/28/2017   Procedure: CYSTOSCOPY;  Surgeon: Courtney Charleston, MD;  Location: [facility];  Service: Gynecology;  Laterality: N/A;  . CYSTOSCOPY Right 01/18/2018   Procedure: CYSTOSCOPY, RETROGRADE / PYELOGRAM, URETEROSCOPY;  Surgeon: Courtney Mons, MD;  Location[facility];  Service: Urology;  Laterality: Right;  . CYSTOSCOPY W/ URETERAL STENT PLACEMENT Right 10/03/2017   Procedure: CYSTOSCOPY WITH RETROGRADE PYELOGRAM/URETERAL STENT PLACEMENT;  Surgeon: Courtney Mons, MD;  Location: [facility];  Service: Urology;  Laterality: Right;  . CYSTOSCOPY W/ URETERAL STENT PLACEMENT Right 02/22/2018   Procedure: CYSTOSCOPY WITH URETERAL STENT PLACEMENT;  Surgeon: Courtney Mons, MD;  Location: [facility];  Service: Urology;  Laterality: Right;  . KNEE ARTHROSCOPY WITH MEDIAL MENISECTOMY Right 06/26/2019   Procedure: KNEE ARTHROSCOPY WITH PARTIAL MEDIAL MENISECTOMY, REMOVAL LOOSE BODY;  Surgeon: Courtney Saas, MD;  Location: [place];  Service: Orthopedics;  Laterality: Right;  .  LIPOMA EXCISION  2014   back  . LIPOMA EXCISION N/A 05/21/2015   Procedure: EXCISION LIPOMAS ON ABDOMEN;  Surgeon: Courtney Luna, MD;  Location: [place];  Service: General;  Laterality: N/A;  . LUMBAR PUNCTURE  09/19/17   [facility], Dr. Curt Andrade  . OOPHORECTOMY Left 09/28/2017   Procedure: OOPHORECTOMY;  Surgeon: Courtney Charleston, MD;  Location: [facility];  Service: Gynecology;  Laterality: Left;  . ROBOTIC ASSISTED LAPAROSCOPIC LYSIS OF ADHESION N/A 09/28/2017   Procedure: ROBOTIC ASSISTED LAPAROSCOPIC LYSIS OF ADHESION;  Surgeon:  Courtney Charleston, MD;  Location: [facility];  Service: Gynecology;  Laterality: N/A;  . ROBOTIC ASSISTED TOTAL HYSTERECTOMY WITH BILATERAL SALPINGO OOPHERECTOMY Bilateral 09/28/2017   Procedure: ROBOTIC ASSISTED TOTAL HYSTERECTOMY WITH BILATERAL SALPINGECTOMY;  Surgeon: Courtney Charleston, MD;  Location: [facility];  Service: Gynecology;  Laterality: Bilateral;  . ROBOTICALLY ASSISTED LAPAROSCOPIC URETERAL RE-IMPLANTATION Right 02/22/2018   Procedure: ROBOTICALLY ASSISTED LAPAROSCOPIC URETERAL RE-IMPLANTATION;  Surgeon: Courtney Mons, MD;  Location: [facility];  Service: Urology;  Laterality: Right;  ONLY NEEDS 240 MIN TOTAL FOR ALL PROCEDURES  . TUBAL LIGATION    . WISDOM TOOTH EXTRACTION       Current Outpatient Medications  Medication Sig Dispense Refill  . apixaban (ELIQUIS) 2.5 MG TABS tablet 1 tablet twice a day for 2 weeks to prevent post op blood clot 30 tablet 0  . estradiol (ESTRACE) 1 MG tablet Take 1 mg by mouth daily.    Marland Kitchen HYDROcodone-acetaminophen (NORCO/VICODIN) 5-325 MG tablet Take 1 tablet by mouth every 4 (four) hours as needed for moderate pain. 20 tablet 0  . OVER THE COUNTER MEDICATION Apply 1 application topically 3 (three) times daily as needed (psoraisis). OTC psoriasis cream    . propranolol (INDERAL) 40 MG tablet TAKE 1 TABLET BY MOUTH EVERY DAY 30 tablet 2   No current facility-administered medications for this visit.     Allergies:   Bee venom, Penicillins, Mushroom extract complex, Ciprofloxacin, and Powder    Social History:  The patient  reports that she has been smoking cigarettes. She started smoking about 5 years ago. She has a 8.00 pack-year smoking history. She has never used smokeless tobacco. She reports that she does not drink alcohol or use drugs.   Family History:  The patient's family history includes Drug abuse in her mother; Heart disease in her maternal grandfather and maternal grandmother.    ROS:  Please see the history of present illness.    Otherwise, review of systems are positive for none.   All other systems are reviewed and negative.    PHYSICAL EXAM: VS:  BP 120/78   Pulse 78   Ht 5\' 7"  (1.702 m)   Wt 207 lb 9.6 oz (94.2 kg)   LMP 09/11/2017 (Approximate)   SpO2 97%   BMI 32.51 kg/m  , BMI Body mass index is 32.51 kg/m. GENERAL:  Well appearing HEENT:  Pupils equal round and reactive, fundi not visualized, oral mucosa unremarkable NECK:  No jugular venous distention, waveform within normal limits, carotid upstroke brisk and symmetric, no bruits, no thyromegaly LYMPHATICS:  No cervical, inguinal adenopathy LUNGS:  Clear to auscultation bilaterally BACK:  No CVA tenderness CHEST:  Unremarkable HEART:  PMI not displaced or sustained,S1 and S2 within normal limits, no S3, no S4, no clicks, no rubs, no murmurs ABD:  Flat, positive bowel sounds normal in frequency in pitch, no bruits, no rebound, no guarding, no midline pulsatile mass, no hepatomegaly, no splenomegaly EXT:  2 plus pulses throughout, no edema, no cyanosis no clubbing SKIN:  No rashes no nodules NEURO:  Cranial nerves II through XII grossly intact, motor grossly intact throughout PSYCH:  Cognitively intact, oriented to person place and time    EKG:  EKG is not ordered  The ekg ordered 06/11/19 demonstrates normal sinus rhythm, rate 75, axis within normal limits, intervals within normal limits, poor anterior R wave progression.   Recent Labs: No results found for requested labs within last 8760 hours.    Lipid Panel    Component Value Date/Time   CHOL 168 10/18/2016 1123   TRIG 142 10/18/2016 1123   HDL 26 (L) 10/18/2016 1123   CHOLHDL 6.5 (H) 10/18/2016 1123   VLDL 28 10/18/2016 1123   LDLCALC 114 (H) 10/18/2016 1123      Wt Readings from Last 3 Encounters:  06/28/19 207 lb 9.6 oz (94.2 kg)  06/26/19 205 lb 14.6 oz (93.4 kg)  06/04/19 205 lb (93 kg)      Other studies Reviewed: Additional studies/ records that were reviewed today  include: EKG. Review of the above records demonstrates:  Please see elsewhere in the note.     ASSESSMENT AND PLAN:  ABNORMAL EKG:   Patient has poor anterior R wave progression on EKG.  However, she has no cardiovascular symptoms.  She has minimal cardiovascular risk factors.  It is very unlikely that this represents an old anteroseptal infarct.  Is unchanged over serial EKGs.  No further work-up is suggested.  HTN:   The blood pressure is at target. No change in medications is indicated. We will continue with therapeutic lifestyle changes (TLC).  TOBACCO: She has not tolerated Chantix in the past.  I asked her to call [phone]   Current medicines are reviewed at length with the patient today.  The patient does not have concerns regarding medicines.  The following changes have been made:  no change  Labs/ tests ordered today include: None No orders of the defined types were placed in this encounter.    Disposition:   FU with me as needed    Signed, Minus Breeding, MD  06/28/2019 10:05 AM    [place]

## 2019-06-27 NOTE — Op Note (Signed)
NAME: [name], [name] MEDICAL RECORD [id number] ACCOUNT [id number] DATE OF BIRTH:09-Jun-1981 FACILITY: [facility] LOCATION: [facility] PHYSICIAN:Prestin Munch Venetia Maxon, MD  OPERATIVE REPORT  DATE OF PROCEDURE:  06/26/2019  PREOPERATIVE DIAGNOSES:   1.  Right knee acute traumatic medial and lateral meniscal tears. 2.  Right knee acute traumatic synovitis. 3.  Right knee patellofemoral chondromalacia.  POSTOPERATIVE DIAGNOSES: 1.  Right knee acute traumatic medial and lateral meniscal tears. 2.  Right knee acute traumatic synovitis. 3.  Right knee patellofemoral chondromalacia.  PROCEDURE PERFORMED: 1.  Right knee examination under anesthesia followed by arthroscopic partial medial and lateral meniscectomies. 2.  Right knee synovectomy. 3.  Right knee patellofemoral chondroplasty.  SURGEON:  Elsie Saas, MD  ASSISTANT:  Matthew Saras, PA  ANESTHESIA:  General.  OPERATIVE TIME:  40 minutes.  COMPLICATIONS:  None.  INDICATION FOR PROCEDURE:  The patient is a 38 year old woman who sustained twisting injuries to her right knee over the past 3-4 months with increasing pain.  Exam and MRIs revealed medial and lateral meniscal tears with synovitis and chondromalacia.   She has failed conservative care and is now to undergo arthroscopy.  DESCRIPTION OF PROCEDURE:  The patient was brought to the operating room on 06/26/2019 and placed on the operating table in supine position.  After being placed under general anesthesia, her right knee was examined.  She had full range of motion, and her  knee was stable ligamentous exam.  The knee was sterilely injected with 0.25% Marcaine with epinephrine.  The right leg was then prepped using sterile DuraPrep and draped using sterile technique.  Time-out procedure was called and the correct right knee  identified.  Initially through an anterolateral portal, the arthroscope with a pump attached was placed in through an anteromedial portal and an  arthroscopic probe was placed.  On initial inspection of medial compartment, the articular cartilage was  intact.  A medial meniscus tear, 20% posterior horn, which was resected back to a stable rim.  Intercondylar notch showed a large synovitic cystic mass anterior to the ACL, which was thoroughly debrided.  The ACL and PCL were intact and stable.  Lateral  compartment was inspected.  The articular cartilage was intact.  Lateral meniscus showed a partial tear, 25% posterolateral horn, which was resected back to a stable rim.  Patellofemoral joint showed 40% grade III chondromalacia on the femoral groove,  which was debrided.  Patellar articular cartilage was intact.  The patella tracked normally.  Medial and lateral gutter showed moderate synovitis, which was debrided.  Otherwise, they were free of pathology.  After this was done, it was felt that all  pathology had been satisfactorily addressed.  The instruments were removed.  Portals closed with 3-0 nylon suture.  Sterile dressings were applied.  The patient was awakened and taken to recovery room in stable condition.  FOLLOWUP CARE:  The patient to be followed as an outpatient, on Norco for pain.  We will see her back in the office in a week for sutures out and followup.  LN/NUANCE  D:06/27/2019 T:06/27/2019 JOB:[id number]

## 2019-06-28 ENCOUNTER — Encounter: Payer: Self-pay | Admitting: Physical Therapy

## 2019-06-28 ENCOUNTER — Encounter: Payer: Self-pay | Admitting: Cardiology

## 2019-06-28 ENCOUNTER — Other Ambulatory Visit: Payer: Self-pay

## 2019-06-28 ENCOUNTER — Ambulatory Visit: Payer: Medicaid Other | Admitting: Cardiology

## 2019-06-28 ENCOUNTER — Ambulatory Visit: Payer: Medicaid Other | Attending: Physician Assistant | Admitting: Physical Therapy

## 2019-06-28 VITALS — BP 120/78 | HR 78 | Ht 67.0 in | Wt 207.6 lb

## 2019-06-28 DIAGNOSIS — Z9889 Other specified postprocedural states: Secondary | ICD-10-CM

## 2019-06-28 DIAGNOSIS — M25561 Pain in right knee: Secondary | ICD-10-CM

## 2019-06-28 DIAGNOSIS — I10 Essential (primary) hypertension: Secondary | ICD-10-CM

## 2019-06-28 DIAGNOSIS — Z01810 Encounter for preprocedural cardiovascular examination: Secondary | ICD-10-CM

## 2019-06-28 NOTE — Patient Instructions (Signed)
Medication Instructions:  Your physician recommends that you continue on your current medications as directed. Please refer to the Current Medication list given to you today.  If you need a refill on your cardiac medications before your next appointment, please call your pharmacy.   Lab work: NONE  Testing/Procedures: NONE  Follow-Up: AS NEEDED   

## 2019-06-28 NOTE — Therapy (Signed)
[place], [place], [address] Phone: [phone]   Fax:  [phone]  Physical Therapy Evaluation  Patient Details  Name: [name] MRN: [id number] Date of Birth: May 19, 1981 Referring Provider (PT): Matthew Saras, [place]   Encounter Date: 06/28/2019  PT End of Session - 06/28/19 1156    Visit Number  1    Authorization Type  MCD- auth submitted 8/27    PT Start Time  1137    PT Stop Time  1156    PT Time Calculation (min)  19 min    Activity Tolerance  Patient tolerated treatment well    Behavior During Therapy  [facility] for tasks assessed/performed       Past Medical History:  Diagnosis Date  . Abdominal lipoma 05/2015  . Abdominal pain, RLQ 10/03/2017  . Acute medial meniscus tear of right knee 06/09/2019  . Anxiety   . Arthritis    lower back  . DM type 2 with diabetic peripheral neuropathy (HCC)    Borderline  . GERD (gastroesophageal reflux disease)   . Headache    Migraines  . History of MRSA infection ~ 2006   leg  . History of shingles   . Hx of hydronephrosis    following total hysterectomy surgery ; Right kidney ; see CT abdomen 10-02-17 epic; states " a plug had got damaged during the surgery so thats why they put the stent in"   . Hypertension    under control with med., has been on med. x 6 mos.  . Lumbosacral spondylolysis    see MRI 06-01-17 epic   . Mood swing   . Optic nerve swelling    seen by Dr Mora Bellman 09-07-17, did lumbar puncture 09-19-17; patient states " they never called me about results ut i have an appt with him in april"; patient also states  " my vision is still blurry and worse when i get the migraines, they think the migraines are causing it"  . Psoriasis     Past Surgical History:  Procedure Laterality Date  . CESAREAN SECTION  06/21/2008; 08/18/2010  . CESAREAN SECTION N/A 07/10/2014   Procedure: REPEAT CESAREAN SECTION;  Surgeon: Daria Pastures, MD;   Location: [facility];  Service: Obstetrics;  Laterality: N/A;  . CHOLECYSTECTOMY  07/10/2008  . CYSTOSCOPY N/A 09/28/2017   Procedure: CYSTOSCOPY;  Surgeon: Bobbye Charleston, MD;  Location: [facility];  Service: Gynecology;  Laterality: N/A;  . CYSTOSCOPY Right 01/18/2018   Procedure: CYSTOSCOPY, RETROGRADE / PYELOGRAM, URETEROSCOPY;  Surgeon: Ceasar Mons, MD;  Location[facility];  Service: Urology;  Laterality: Right;  . CYSTOSCOPY W/ URETERAL STENT PLACEMENT Right 10/03/2017   Procedure: CYSTOSCOPY WITH RETROGRADE PYELOGRAM/URETERAL STENT PLACEMENT;  Surgeon: Ceasar Mons, MD;  Location: [facility];  Service: Urology;  Laterality: Right;  . CYSTOSCOPY W/ URETERAL STENT PLACEMENT Right 02/22/2018   Procedure: CYSTOSCOPY WITH URETERAL STENT PLACEMENT;  Surgeon: Ceasar Mons, MD;  Location: [facility];  Service: Urology;  Laterality: Right;  . KNEE ARTHROSCOPY WITH MEDIAL MENISECTOMY Right 06/26/2019   Procedure: KNEE ARTHROSCOPY WITH PARTIAL MEDIAL MENISECTOMY, REMOVAL LOOSE BODY;  Surgeon: Elsie Saas, MD;  Location: [place];  Service: Orthopedics;  Laterality: Right;  . LIPOMA EXCISION  2014   back  . LIPOMA EXCISION N/A 05/21/2015   Procedure: EXCISION LIPOMAS ON ABDOMEN;  Surgeon: Erroll Luna, MD;  Location: [place];  Service: General;  Laterality: N/A;  .  LUMBAR PUNCTURE  09/19/17   [facility], Dr. Curt Bears  . OOPHORECTOMY Left 09/28/2017   Procedure: OOPHORECTOMY;  Surgeon: Bobbye Charleston, MD;  Location: [facility];  Service: Gynecology;  Laterality: Left;  . ROBOTIC ASSISTED LAPAROSCOPIC LYSIS OF ADHESION N/A 09/28/2017   Procedure: ROBOTIC ASSISTED LAPAROSCOPIC LYSIS OF ADHESION;  Surgeon: Bobbye Charleston, MD;  Location: [facility];  Service: Gynecology;  Laterality: N/A;  . ROBOTIC ASSISTED TOTAL HYSTERECTOMY WITH BILATERAL SALPINGO OOPHERECTOMY Bilateral 09/28/2017   Procedure: ROBOTIC ASSISTED  TOTAL HYSTERECTOMY WITH BILATERAL SALPINGECTOMY;  Surgeon: Bobbye Charleston, MD;  Location: [facility];  Service: Gynecology;  Laterality: Bilateral;  . ROBOTICALLY ASSISTED LAPAROSCOPIC URETERAL RE-IMPLANTATION Right 02/22/2018   Procedure: ROBOTICALLY ASSISTED LAPAROSCOPIC URETERAL RE-IMPLANTATION;  Surgeon: Ceasar Mons, MD;  Location[facility];  Service: Urology;  Laterality: Right;  ONLY NEEDS 240 MIN TOTAL FOR ALL PROCEDURES  . TUBAL LIGATION    . WISDOM TOOTH EXTRACTION      There were no vitals filed for this visit.   Subjective Assessment - 06/28/19 1145    Subjective  Doing okay so far, out of work for about 6 weeks. Knee pain is chronic but started a new exercise routine a couple of months ago and went for a walk and knee blew up. Sleeps on couch due to back pain.    Patient Stated Goals  get my legs right    Currently in Pain?  No/denies    Pain Score  3     Pain Location  Knee    Pain Orientation  Right    Pain Descriptors / Indicators  Burning    Pain Relieving Factors  ice, meds         [facility] PT Assessment - 06/28/19 0001      Assessment   Medical Diagnosis  s/p Rt partial medial menisectomy    Referring Provider (PT)  Matthew Saras, PA-C    Onset Date/Surgical Date  06/28/19      Precautions   Precaution Comments  one week on crutches without WB      Balance Screen   Has the patient fallen in the past 6 months  No      [place] residence    Living Arrangements  Spouse/significant other;Children    Additional Comments  one step into house      Prior Function   Level of Independence  Independent    Dispensing optician and car auction-mostly driving      Cognition   Overall Cognitive Status  Within Functional Limits for tasks assessed      Sensation   Additional Comments  tingling/burning in knee      ROM / Strength   AROM / PROM / Strength  AROM      AROM   AROM Assessment Site  Knee     Right/Left Knee  Right    Right Knee Extension  -6    Right Knee Flexion  68      Ambulation/Gait   Gait Comments  bil axillary crutch without weight bearing                Objective measurements completed on examination: See above findings.              PT Education - 06/28/19 1209    Education Details  anatomy of condition, POC, HEP, exercise form/rationale    Person(s) Educated  Patient    Methods  Explanation  Comprehension  Verbalized understanding;Need further instruction       PT Short Term Goals - 06/28/19 1205      PT SHORT TERM GOAL #1   Title  knee extension to 0    Baseline  -6 at eval    Time  4    Period  Weeks    Status  New    Target Date  07/26/19      PT SHORT TERM GOAL #2   Title  Pt will demonstrate proper heel-toe gait pattern    Baseline  non-weight bearing at eval    Time  4    Period  Weeks    Status  New    Target Date  07/26/19      PT SHORT TERM GOAL #3   Title  pt will be independent with HEP as it has been created in the short term.    Baseline  began at eval, will progress as appropriate    Time  4    Period  Weeks    Status  New    Target Date  07/26/19                Plan - 06/28/19 1201    Clinical Impression Statement  Pt presents to PT wtih complaints of Rt knee pain and limited functional use s/p partial menisectomy 2 days ago. Pt reports MD wants her on her crutches for 1 week without weight bearing. Limited ROM as expected this early after surgery. Encouraged her to move her ankle and hip as much as she can and we discussed proper knee stretching. Pt will benefit from skilled PT in order to meet functional goals.    Personal Factors and Comorbidities  Comorbidity 1    Comorbidities  chronic back pain, anxiety    Examination-Activity Limitations  Locomotion Level;Bed Mobility;Bend;Sit;Sleep;Squat;Stairs;Stand;Caring for Others    Examination-Participation Restrictions  Meal Prep;Cleaning;Laundry     Stability/Clinical Decision Making  Stable/Uncomplicated    Clinical Decision Making  Low    PT Frequency  --   3 visits in first auth   PT Treatment/Interventions  ADLs/Self Care Home Management;Cryotherapy;Electrical Stimulation;Moist Heat;Gait training;Stair training;Functional mobility training;Therapeutic activities;Therapeutic exercise;Patient/family education;Neuromuscular re-education;Manual techniques;Scar mobilization;Passive range of motion;Taping    PT Next Visit Plan  knee ROM, hip strengthening, measure edema without wrap (surgical wrap still in place today)    PT Home Exercise Plan  ankle movement, RICE, hip extension stretch in standing    Consulted and Agree with Plan of Care  Patient       Patient will benefit from skilled therapeutic intervention in order to improve the following deficits and impairments:  Abnormal gait, Decreased range of motion, Difficulty walking, Increased muscle spasms, Decreased activity tolerance, Pain, Improper body mechanics, Impaired flexibility, Decreased strength, Increased edema  Visit Diagnosis: S/P arthroscopic partial medial meniscectomy  Acute pain of right knee     Problem List Patient Active Problem List   Diagnosis Date Noted  . Acute medial meniscus tear of right knee 06/09/2019  . DM type 2 with diabetic peripheral neuropathy ([place])   . Acute pain of right knee 04/20/2019  . 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 Hyperlipidemia 01/20/2018  . Tobacco use disorder 01/19/2018  . Anxiety state 01/17/2018  . Abdominal pain, RLQ 10/03/2017  . Postoperative state 09/28/2017  . Diabetic neuropathy ([place]) 06/21/2017  . Anemia 06/20/2017  . Diabetes type 2, controlled ([place]) 05/16/2017  . H/O: C-section 05/16/2017  . Vitamin B12 deficiency 05/16/2017  .  Health care maintenance 10/18/2016  . Psoriasis 10/01/2013  . Hypertension 09/26/2012  . Obesity 09/26/2012  . Subcutaneous nodule 09/26/2012  . History of abnormal Pap smear  09/26/2012    Jaecion Dempster C. Eleanore Junio PT, DPT 06/28/19 12:10 PM   [facility] [address] [place], [place], [address] Phone: [phone]   Fax:  [phone]  Name: Syleena Reposa MRN: [id number] Date of Birth: 02-10-1981

## 2019-07-03 ENCOUNTER — Encounter: Payer: Medicaid Other | Admitting: Physical Therapy

## 2019-07-04 ENCOUNTER — Ambulatory Visit: Payer: Medicaid Other | Attending: Physician Assistant | Admitting: Physical Therapy

## 2019-07-04 ENCOUNTER — Telehealth: Payer: Self-pay | Admitting: Physical Therapy

## 2019-07-04 DIAGNOSIS — M25561 Pain in right knee: Secondary | ICD-10-CM | POA: Insufficient documentation

## 2019-07-04 DIAGNOSIS — Z9889 Other specified postprocedural states: Secondary | ICD-10-CM | POA: Insufficient documentation

## 2019-07-04 NOTE — Telephone Encounter (Signed)
I called pt about her missed PT visit. Pt did not answer, so I left a message about rescheduling or canceling if she needed to for further appointments.   Kearney Hard, PT 07/04/19 11:28 AM

## 2019-07-13 ENCOUNTER — Encounter

## 2019-07-16 ENCOUNTER — Ambulatory Visit: Payer: Medicaid Other | Admitting: Physical Therapy

## 2019-07-16 ENCOUNTER — Encounter: Payer: Self-pay | Admitting: Physical Therapy

## 2019-07-16 ENCOUNTER — Other Ambulatory Visit: Payer: Self-pay

## 2019-07-16 DIAGNOSIS — M25561 Pain in right knee: Secondary | ICD-10-CM

## 2019-07-16 DIAGNOSIS — Z9889 Other specified postprocedural states: Secondary | ICD-10-CM | POA: Diagnosis present

## 2019-07-16 NOTE — Therapy (Signed)
[place], [place], [address] Phone: [phone]   Fax:  [phone]  Physical Therapy Treatment  Patient Details  Name: [name] MRN: [id number] Date of Birth: 01-05-1981 Referring Provider (PT): Matthew Saras, [place]   Encounter Date: 07/16/2019  PT End of Session - 07/16/19 1116    Visit Number  2    Number of Visits  4    Authorization Type  Medicaid approved 3 treatments    Authorization Time Period  07/15/19-08/11/19    Authorization - Visit Number  1    Authorization - Number of Visits  3    PT Start Time  1055    PT Stop Time  1133    PT Time Calculation (min)  38 min       Past Medical History:  Diagnosis Date  . Abdominal lipoma 05/2015  . Abdominal pain, RLQ 10/03/2017  . Acute medial meniscus tear of right knee 06/09/2019  . Anxiety   . Arthritis    lower back  . DM type 2 with diabetic peripheral neuropathy (HCC)    Borderline  . GERD (gastroesophageal reflux disease)   . Headache    Migraines  . History of MRSA infection ~ 2006   leg  . History of shingles   . Hx of hydronephrosis    following total hysterectomy surgery ; Right kidney ; see CT abdomen 10-02-17 epic; states " a plug had got damaged during the surgery so thats why they put the stent in"   . Hypertension    under control with med., has been on med. x 6 mos.  . Lumbosacral spondylolysis    see MRI 06-01-17 epic   . Mood swing   . Optic nerve swelling    seen by Dr Mora Bellman 09-07-17, did lumbar puncture 09-19-17; patient states " they never called me about results ut i have an appt with him in april"; patient also states  " my vision is still blurry and worse when i get the migraines, they think the migraines are causing it"  . Psoriasis     Past Surgical History:  Procedure Laterality Date  . CESAREAN SECTION  06/21/2008; 08/18/2010  . CESAREAN SECTION N/A 07/10/2014   Procedure: REPEAT CESAREAN SECTION;   Surgeon: Daria Pastures, MD;  Location: [facility];  Service: Obstetrics;  Laterality: N/A;  . CHOLECYSTECTOMY  07/10/2008  . CYSTOSCOPY N/A 09/28/2017   Procedure: CYSTOSCOPY;  Surgeon: Bobbye Charleston, MD;  Location: [facility];  Service: Gynecology;  Laterality: N/A;  . CYSTOSCOPY Right 01/18/2018   Procedure: CYSTOSCOPY, RETROGRADE / PYELOGRAM, URETEROSCOPY;  Surgeon: Ceasar Mons, MD;  [facility];  Service: Urology;  Laterality: Right;  . CYSTOSCOPY W/ URETERAL STENT PLACEMENT Right 10/03/2017   Procedure: CYSTOSCOPY WITH RETROGRADE PYELOGRAM/URETERAL STENT PLACEMENT;  Surgeon: Ceasar Mons, MD;  Location: [facility];  Service: Urology;  Laterality: Right;  . CYSTOSCOPY W/ URETERAL STENT PLACEMENT Right 02/22/2018   Procedure: CYSTOSCOPY WITH URETERAL STENT PLACEMENT;  Surgeon: Ceasar Mons, MD;  Location: [facility];  Service: Urology;  Laterality: Right;  . KNEE ARTHROSCOPY WITH MEDIAL MENISECTOMY Right 06/26/2019   Procedure: KNEE ARTHROSCOPY WITH PARTIAL MEDIAL MENISECTOMY, REMOVAL LOOSE BODY;  Surgeon: Elsie Saas, MD;  Location: [facility];  Service: Orthopedics;  Laterality: Right;  . LIPOMA EXCISION  2014   back  . LIPOMA EXCISION N/A 05/21/2015   Procedure: EXCISION LIPOMAS ON ABDOMEN;  Surgeon: Erroll Luna, MD;  Location: [facility];  Service: General;  Laterality: N/A;  . LUMBAR PUNCTURE  09/19/17   [facility], Dr. Curt Bears  . OOPHORECTOMY Left 09/28/2017   Procedure: OOPHORECTOMY;  Surgeon: Bobbye Charleston, MD;  Location: [facility];  Service: Gynecology;  Laterality: Left;  . ROBOTIC ASSISTED LAPAROSCOPIC LYSIS OF ADHESION N/A 09/28/2017   Procedure: ROBOTIC ASSISTED LAPAROSCOPIC LYSIS OF ADHESION;  Surgeon: Bobbye Charleston, MD;  Location: [facility];  Service: Gynecology;  Laterality: N/A;  . ROBOTIC ASSISTED TOTAL HYSTERECTOMY WITH BILATERAL SALPINGO OOPHERECTOMY Bilateral 09/28/2017    Procedure: ROBOTIC ASSISTED TOTAL HYSTERECTOMY WITH BILATERAL SALPINGECTOMY;  Surgeon: Bobbye Charleston, MD;  Location: [facility];  Service: Gynecology;  Laterality: Bilateral;  . ROBOTICALLY ASSISTED LAPAROSCOPIC URETERAL RE-IMPLANTATION Right 02/22/2018   Procedure: ROBOTICALLY ASSISTED LAPAROSCOPIC URETERAL RE-IMPLANTATION;  Surgeon: Ceasar Mons, MD;  Location[facility];  Service: Urology;  Laterality: Right;  ONLY NEEDS 240 MIN TOTAL FOR ALL PROCEDURES  . TUBAL LIGATION    . WISDOM TOOTH EXTRACTION      There were no vitals filed for this visit.  Subjective Assessment - 07/16/19 1059    Subjective  I am still having alot of pain. I am doing the exercises the MD gave me.    Currently in Pain?  Yes    Pain Score  5     Pain Location  Knee    Pain Orientation  Right    Pain Descriptors / Indicators  Tightness   pulling,cutting with butter knife   Aggravating Factors   walking    Pain Relieving Factors  ice, meds         [facility] PT Assessment - 07/16/19 0001      Observation/Other Assessments-Edema    Edema  Circumferential      Circumferential Edema   Circumferential - Right  43 cm    Circumferential - Left   41.5cm      AROM   Right Knee Flexion  127                   [facility] Adult PT Treatment/Exercise - 07/16/19 0001      Knee/Hip Exercises: Stretches   Active Hamstring Stretch Limitations  supine with strap 3 x 30 sec       Knee/Hip Exercises: Aerobic   Recumbent Bike  Full revolutions x 5 minutes       Knee/Hip Exercises: Supine   Quad Sets  20 reps    Heel Slides  20 reps    Bridges  20 reps    Straight Leg Raises  10 reps      Knee/Hip Exercises: Sidelying   Hip ABduction  10 reps      Knee/Hip Exercises: Prone   Hamstring Curl  20 reps    Hip Extension  20 reps               PT Short Term Goals - 06/28/19 1205      PT SHORT TERM GOAL #1   Title  knee extension to 0    Baseline  -6 at eval    Time  4    Period  Weeks     Status  New    Target Date  07/26/19      PT SHORT TERM GOAL #2   Title  Pt will demonstrate proper heel-toe gait pattern    Baseline  non-weight bearing at eval    Time  4    Period  Weeks  Status  New    Target Date  07/26/19      PT SHORT TERM GOAL #3   Title  pt will be independent with HEP as it has been created in the short term.    Baseline  began at eval, will progress as appropriate    Time  4    Period  Weeks    Status  New    Target Date  07/26/19               Plan - 07/16/19 1127    Clinical Impression Statement  Pt arrives witthout AD. She is performing HEP from MD. AROM improved. Progressed with knee activation and ROM. began recumbent bike.    PT Next Visit Plan  knee ROM, hip strengthening, measure edema without wrap (surgical wrap still in place today)    PT Home Exercise Plan  ankle movement, RICE, hip extension stretch in standing       Patient will benefit from skilled therapeutic intervention in order to improve the following deficits and impairments:  Abnormal gait, Decreased range of motion, Difficulty walking, Increased muscle spasms, Decreased activity tolerance, Pain, Improper body mechanics, Impaired flexibility, Decreased strength, Increased edema  Visit Diagnosis: S/P arthroscopic partial medial meniscectomy  Acute pain of right knee     Problem List Patient Active Problem List   Diagnosis Date Noted  . Acute medial meniscus tear of right knee 06/09/2019  . DM type 2 with diabetic peripheral neuropathy ([place])   . Acute pain of right knee 04/20/2019  . 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 Hyperlipidemia 01/20/2018  . Tobacco use disorder 01/19/2018  . Anxiety state 01/17/2018  . Abdominal pain, RLQ 10/03/2017  . Postoperative state 09/28/2017  . Diabetic neuropathy ([place]) 06/21/2017  . Anemia 06/20/2017  . Diabetes type 2, controlled ([place]) 05/16/2017  . H/O: C-section 05/16/2017  . Vitamin B12 deficiency 05/16/2017  .  Health care maintenance 10/18/2016  . Psoriasis 10/01/2013  . Hypertension 09/26/2012  . Obesity 09/26/2012  . Subcutaneous nodule 09/26/2012  . History of abnormal Pap smear 09/26/2012    Dorene Ar, PTA  07/16/2019, 11:35 AM  [facility] [address] [place], [place], [address] Phone: [phone]   Fax:  [phone]  Name: Berkeley Cardiel MRN: [id number] Date of Birth: Oct 02, 1981

## 2019-07-18 ENCOUNTER — Telehealth: Payer: Self-pay | Admitting: Physical Therapy

## 2019-07-18 ENCOUNTER — Ambulatory Visit: Payer: Medicaid Other | Admitting: Physical Therapy

## 2019-07-18 NOTE — Telephone Encounter (Signed)
Spoke with patient- reports she looked at her schedule and read it incorrectly. Under the impression she was only supposed to come 1/week. Will be at her next appointment.  Kanchan Gal C. Lessa Huge PT, DPT 07/18/19 10:56 AM

## 2019-07-25 ENCOUNTER — Ambulatory Visit: Payer: Medicaid Other | Admitting: Physical Therapy

## 2019-07-30 ENCOUNTER — Other Ambulatory Visit: Payer: Self-pay | Admitting: Obstetrics and Gynecology

## 2019-07-30 DIAGNOSIS — R1031 Right lower quadrant pain: Secondary | ICD-10-CM

## 2019-08-01 ENCOUNTER — Ambulatory Visit
Admission: RE | Admit: 2019-08-01 | Discharge: 2019-08-01 | Disposition: A | Discharge status: Home / Self Care | Payer: Medicaid Other | Source: Ambulatory Visit | Attending: Obstetrics and Gynecology | Admitting: Obstetrics and Gynecology

## 2019-08-01 DIAGNOSIS — R1031 Right lower quadrant pain: Secondary | ICD-10-CM

## 2019-08-08 ENCOUNTER — Other Ambulatory Visit: Payer: Self-pay

## 2019-08-08 ENCOUNTER — Encounter: Payer: Self-pay | Admitting: Physical Therapy

## 2019-08-08 ENCOUNTER — Ambulatory Visit: Payer: Medicaid Other | Attending: Physician Assistant | Admitting: Physical Therapy

## 2019-08-08 DIAGNOSIS — M25561 Pain in right knee: Secondary | ICD-10-CM | POA: Insufficient documentation

## 2019-08-08 DIAGNOSIS — Z9889 Other specified postprocedural states: Secondary | ICD-10-CM | POA: Diagnosis present

## 2019-08-08 NOTE — Therapy (Signed)
[place], [place], [address] Phone: [phone]   Fax:  [phone]  Physical Therapy Treatment/ERO  Patient Details  Name: [name] MRN: [id number] Date of Birth: Mar 23, 1981 Referring Provider (PT): Matthew Saras, [place]   Encounter Date: 08/08/2019  PT End of Session - 08/08/19 0806    Visit Number  3    Authorization Time Period  07/15/19-08/11/19    Authorization - Visit Number  2    Authorization - Number of Visits  3    PT Start Time  0805    PT Stop Time  0841    PT Time Calculation (min)  36 min    Activity Tolerance  Patient tolerated treatment well    Behavior During Therapy  [facility] for tasks assessed/performed       Past Medical History:  Diagnosis Date  . Abdominal lipoma 05/2015  . Abdominal pain, RLQ 10/03/2017  . Acute medial meniscus tear of right knee 06/09/2019  . Anxiety   . Arthritis    lower back  . DM type 2 with diabetic peripheral neuropathy (HCC)    Borderline  . GERD (gastroesophageal reflux disease)   . Headache    Migraines  . History of MRSA infection ~ 2006   leg  . History of shingles   . Hx of hydronephrosis    following total hysterectomy surgery ; Right kidney ; see CT abdomen 10-02-17 epic; states " a plug had got damaged during the surgery so thats why they put the stent in"   . Hypertension    under control with med., has been on med. x 6 mos.  . Lumbosacral spondylolysis    see MRI 06-01-17 epic   . Mood swing   . Optic nerve swelling    seen by Dr Mora Bellman 09-07-17, did lumbar puncture 09-19-17; patient states " they never called me about results ut i have an appt with him in april"; patient also states  " my vision is still blurry and worse when i get the migraines, they think the migraines are causing it"  . Psoriasis     Past Surgical History:  Procedure Laterality Date  . CESAREAN SECTION  06/21/2008; 08/18/2010  . CESAREAN SECTION N/A  07/10/2014   Procedure: REPEAT CESAREAN SECTION;  Surgeon: Daria Pastures, MD;  Location: [facility];  Service: Obstetrics;  Laterality: N/A;  . CHOLECYSTECTOMY  07/10/2008  . CYSTOSCOPY N/A 09/28/2017   Procedure: CYSTOSCOPY;  Surgeon: Bobbye Charleston, MD;  Location: [facility];  Service: Gynecology;  Laterality: N/A;  . CYSTOSCOPY Right 01/18/2018   Procedure: CYSTOSCOPY, RETROGRADE / PYELOGRAM, URETEROSCOPY;  Surgeon: Ceasar Mons, MD;  Location: [facility];  Service: Urology;  Laterality: Right;  . CYSTOSCOPY W/ URETERAL STENT PLACEMENT Right 10/03/2017   Procedure: CYSTOSCOPY WITH RETROGRADE PYELOGRAM/URETERAL STENT PLACEMENT;  Surgeon: Ceasar Mons, MD;  Location: [facility];  Service: Urology;  Laterality: Right;  . CYSTOSCOPY W/ URETERAL STENT PLACEMENT Right 02/22/2018   Procedure: CYSTOSCOPY WITH URETERAL STENT PLACEMENT;  Surgeon: Ceasar Mons, MD;  [facility];  Service: Urology;  Laterality: Right;  . KNEE ARTHROSCOPY WITH MEDIAL MENISECTOMY Right 06/26/2019   Procedure: KNEE ARTHROSCOPY WITH PARTIAL MEDIAL MENISECTOMY, REMOVAL LOOSE BODY;  Surgeon: Elsie Saas, MD;  Location: [facility];  Service: Orthopedics;  Laterality: Right;  . LIPOMA EXCISION  2014   back  . LIPOMA EXCISION N/A 05/21/2015   Procedure: EXCISION LIPOMAS ON ABDOMEN;  Surgeon:  Erroll Luna, MD;  Location: [facility];  Service: General;  Laterality: N/A;  . LUMBAR PUNCTURE  09/19/17   [facility], Dr. Curt Bears  . OOPHORECTOMY Left 09/28/2017   Procedure: OOPHORECTOMY;  Surgeon: Bobbye Charleston, MD;  Location: [facility];  Service: Gynecology;  Laterality: Left;  . ROBOTIC ASSISTED LAPAROSCOPIC LYSIS OF ADHESION N/A 09/28/2017   Procedure: ROBOTIC ASSISTED LAPAROSCOPIC LYSIS OF ADHESION;  Surgeon: Bobbye Charleston, MD;  Location: [facility];  Service: Gynecology;  Laterality: N/A;  . ROBOTIC ASSISTED TOTAL HYSTERECTOMY WITH  BILATERAL SALPINGO OOPHERECTOMY Bilateral 09/28/2017   Procedure: ROBOTIC ASSISTED TOTAL HYSTERECTOMY WITH BILATERAL SALPINGECTOMY;  Surgeon: Bobbye Charleston, MD;  Location: [facility];  Service: Gynecology;  Laterality: Bilateral;  . ROBOTICALLY ASSISTED LAPAROSCOPIC URETERAL RE-IMPLANTATION Right 02/22/2018   Procedure: ROBOTICALLY ASSISTED LAPAROSCOPIC URETERAL RE-IMPLANTATION;  Surgeon: Ceasar Mons, MD;  [facility];  Service: Urology;  Laterality: Right;  ONLY NEEDS 240 MIN TOTAL FOR ALL PROCEDURES  . TUBAL LIGATION    . WISDOM TOOTH EXTRACTION      There were no vitals filed for this visit.  Subjective Assessment - 08/08/19 0806    Subjective  Hurting the most at the incisions. Exercises are going fine.    Patient Stated Goals  get my legs right    Currently in Pain?  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      Cognition   Overall Cognitive Status  Within Functional Limits for tasks assessed      Sensation   Additional Comments  Tingling and burning in knee comes and goes      ROM / Strength   AROM / PROM / Strength  Strength      AROM   Right Knee Extension  0    Right Knee Flexion  130      Strength   Strength Assessment Site  Hip;Knee    Right/Left Hip  Right    Right Hip Flexion  4-/5    Right Hip Extension  4-/5    Right Hip ABduction  4-/5    Right/Left Knee  Right    Right Knee Flexion   4/5    Right Knee Extension  5/5                   [facility] Adult PT Treatment/Exercise - 08/08/19 0001      Exercises   Exercises  Knee/Hip      Knee/Hip Exercises: Stretches   Passive Hamstring Stretch Limitations  seated EOB    Piriformis Stretch  Both;30 seconds    Gastroc Stretch  Both;2 reps;30 seconds    Gastroc Stretch Limitations  slant board      Knee/Hip Exercises: Aerobic  Recumbent Bike  5 min L2      Knee/Hip Exercises: Standing   Heel Raises Limitations  with rt eccentric lower    SLS  hinge to reach for floor    SLS with Vectors  with opp LE ER/IR    Other Standing Knee Exercises  slow sit to table             PT Education - 08/08/19 0853    Education Details  goals, POC, HEP    Person(s) Educated  Patient    Methods  Explanation;Handout;Verbal cues;Tactile cues;Demonstration    Comprehension  Verbalized understanding;Returned demonstration;Verbal cues required;Tactile cues required;Need further instruction       PT Short Term Goals - 08/08/19 0830      PT SHORT TERM GOAL #1   Title  knee extension to 0    Status  Achieved      PT SHORT TERM GOAL #2   Title  Pt will demonstrate proper heel-toe gait pattern    Baseline  good heel-toe without antalgic pattern for short distances    Status  Achieved      PT SHORT TERM GOAL #3   Title  pt will be independent with HEP as it has been created in the short term.    Status  Achieved        PT Long Term Goals - 08/08/19 0831      PT LONG TERM GOAL #1   Title  will tolerate walking for approx 45 min    Baseline  significant pain after short distances    Time  7    Period  Weeks    Status  New    Target Date  09/28/19      PT LONG TERM GOAL #2   Title  proper navigation of steps/stairs    Baseline  compensates due to pain at eval    Time  7    Period  Weeks    Status  New    Target Date  05/28/19      PT LONG TERM GOAL #3   Title  pain <=2/10    Baseline  mod-severe levels at  this time    Time  7    Period  Weeks    Status  New    Target Date  05/28/19      PT LONG TERM GOAL #4   Title  sit to stand and begin walking without limitations    Baseline  "this is my biggest problem right now"    Time  7    Period  Weeks    Status  New    Target Date  09/28/19            Plan - 08/08/19 0847    Clinical Impression Statement  Pt has made improvements in gait pattern, strength and ROM since beginning PT.  She will further benefit from PT to progress strength, endurance and balance. Pt is progressing as expected at this point post op. Did not complete 3 visits in authorized time period due to one no show and rescheduled appointment conflicted with another appointment.    Comorbidities  chronic back pain, anxiety    Examination-Activity Limitations  Locomotion Level;Bed Mobility;Bend;Sit;Sleep;Squat;Stairs;Stand;Caring for Others    Examination-Participation Restrictions  Meal Prep;Cleaning;Laundry    Stability/Clinical Decision Making  Stable/Uncomplicated    Clinical Decision Making  Low    Rehab Potential  Good    PT Frequency  2x / week  PT Duration  6 weeks    PT Treatment/Interventions  ADLs/Self Care Home Management;Cryotherapy;Electrical Stimulation;Moist Heat;Gait training;Stair training;Functional mobility training;Therapeutic activities;Therapeutic exercise;Patient/family education;Neuromuscular re-education;Manual techniques;Scar mobilization;Passive range of motion;Taping    PT Next Visit Plan  proximal strength, balance, endurance    PT Home Exercise Plan  ankle movement, RICE, hip extension stretch in standing, ecc heel raises, diver, sit<>stand, SLS with opp ER/IR, HSS, gastroc stretch, piriformis stretch    Consulted and Agree with Plan of Care  Patient       Patient will benefit from skilled therapeutic intervention in order to improve the following deficits and impairments:  Abnormal gait, Decreased range of motion, Difficulty walking,  Increased muscle spasms, Decreased activity tolerance, Pain, Improper body mechanics, Impaired flexibility, Decreased strength, Increased edema  Visit Diagnosis: S/P arthroscopic partial medial meniscectomy - Plan: PT plan of care cert/re-cert  Acute pain of right knee - Plan: PT plan of care cert/re-cert     Problem List Patient Active Problem List   Diagnosis Date Noted  . Acute medial meniscus tear of right knee 06/09/2019  . DM type 2 with diabetic peripheral neuropathy ([place])   . Acute pain of right knee 04/20/2019  . 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 Hyperlipidemia 01/20/2018  . Tobacco use disorder 01/19/2018  . Anxiety state 01/17/2018  . Abdominal pain, RLQ 10/03/2017  . Postoperative state 09/28/2017  . Diabetic neuropathy ([place]) 06/21/2017  . Anemia 06/20/2017  . Diabetes type 2, controlled ([place]) 05/16/2017  . H/O: C-section 05/16/2017  . Vitamin B12 deficiency 05/16/2017  . Health care maintenance 10/18/2016  . Psoriasis 10/01/2013  . Hypertension 09/26/2012  . Obesity 09/26/2012  . Subcutaneous nodule 09/26/2012  . History of abnormal Pap smear 09/26/2012   Jennene Downie C. Rudolph Dobler PT, DPT 08/08/19 8:57 AM   [facility] [address] [place], [place], [address] Phone: [phone]   Fax:  [phone]  Name: [name] MRN: [id number] Date of Birth: 1981/07/18

## 2019-08-22 ENCOUNTER — Telehealth: Payer: Self-pay | Admitting: Physical Therapy

## 2019-08-22 ENCOUNTER — Ambulatory Visit: Payer: Medicaid Other | Admitting: Physical Therapy

## 2019-08-22 NOTE — Telephone Encounter (Signed)
Spoke with patient about appointment today- she says she knows she missed but she has been sick. Asked her to call us for appointments that she is going to miss. Cyprian Gong[place] C. Emaad Nanna PT, DPT 08/22/19 4:26 PM

## 2019-08-27 ENCOUNTER — Ambulatory Visit: Payer: Medicaid Other | Admitting: Physical Therapy

## 2019-08-29 ENCOUNTER — Other Ambulatory Visit: Payer: Self-pay

## 2019-08-29 ENCOUNTER — Encounter: Payer: Self-pay | Admitting: Physical Therapy

## 2019-08-29 ENCOUNTER — Ambulatory Visit: Payer: Medicaid Other | Admitting: Physical Therapy

## 2019-08-29 DIAGNOSIS — Z9889 Other specified postprocedural states: Secondary | ICD-10-CM | POA: Diagnosis not present

## 2019-08-29 DIAGNOSIS — M25561 Pain in right knee: Secondary | ICD-10-CM

## 2019-08-29 NOTE — Therapy (Signed)
[place], [place], [address] Phone: [phone]   Fax:  [phone]  Physical Therapy Treatment  Patient Details  Name: [name] MRN: [id number] Date of Birth: 06-Jul-1981 Referring Provider (PT): Matthew Saras, [place]   Encounter Date: 08/29/2019  PT End of Session - 08/29/19 1543    Visit Number  4    Authorization Type  MCD 10/20-11/30    Authorization - Visit Number  1    Authorization - Number of Visits  12    PT Start Time  1500    PT Stop Time  1554    PT Time Calculation (min)  54 min    Activity Tolerance  Patient tolerated treatment well    Behavior During Therapy  [facility] for tasks assessed/performed       Past Medical History:  Diagnosis Date  . Abdominal lipoma 05/2015  . Abdominal pain, RLQ 10/03/2017  . Acute medial meniscus tear of right knee 06/09/2019  . Anxiety   . Arthritis    lower back  . DM type 2 with diabetic peripheral neuropathy (HCC)    Borderline  . GERD (gastroesophageal reflux disease)   . Headache    Migraines  . History of MRSA infection ~ 2006   leg  . History of shingles   . Hx of hydronephrosis    following total hysterectomy surgery ; Right kidney ; see CT abdomen 10-02-17 epic; states " a plug had got damaged during the surgery so thats why they put the stent in"   . Hypertension    under control with med., has been on med. x 6 mos.  . Lumbosacral spondylolysis    see MRI 06-01-17 epic   . Mood swing   . Optic nerve swelling    seen by Dr Mora Bellman 09-07-17, did lumbar puncture 09-19-17; patient states " they never called me about results ut i have an appt with him in april"; patient also states  " my vision is still blurry and worse when i get the migraines, they think the migraines are causing it"  . Psoriasis     Past Surgical History:  Procedure Laterality Date  . CESAREAN SECTION  06/21/2008; 08/18/2010  . CESAREAN SECTION N/A 07/10/2014    Procedure: REPEAT CESAREAN SECTION;  Surgeon: Daria Pastures, MD;  Location: [facility];  Service: Obstetrics;  Laterality: N/A;  . CHOLECYSTECTOMY  07/10/2008  . CYSTOSCOPY N/A 09/28/2017   Procedure: CYSTOSCOPY;  Surgeon: Bobbye Charleston, MD;  Location: [facility];  Service: Gynecology;  Laterality: N/A;  . CYSTOSCOPY Right 01/18/2018   Procedure: CYSTOSCOPY, RETROGRADE / PYELOGRAM, URETEROSCOPY;  Surgeon: Ceasar Mons, MD;  Location: [facility];  Service: Urology;  Laterality: Right;  . CYSTOSCOPY W/ URETERAL STENT PLACEMENT Right 10/03/2017   Procedure: CYSTOSCOPY WITH RETROGRADE PYELOGRAM/URETERAL STENT PLACEMENT;  Surgeon: Ceasar Mons, MD;  Location: [facility];  Service: Urology;  Laterality: Right;  . CYSTOSCOPY W/ URETERAL STENT PLACEMENT Right 02/22/2018   Procedure: CYSTOSCOPY WITH URETERAL STENT PLACEMENT;  Surgeon: Ceasar Mons, MD;  Location: [facility];  Service: Urology;  Laterality: Right;  . KNEE ARTHROSCOPY WITH MEDIAL MENISECTOMY Right 06/26/2019   Procedure: KNEE ARTHROSCOPY WITH PARTIAL MEDIAL MENISECTOMY, REMOVAL LOOSE BODY;  Surgeon: Elsie Saas, MD;  Location: [facility];  Service: Orthopedics;  Laterality: Right;  . LIPOMA EXCISION  2014   back  . LIPOMA EXCISION N/A 05/21/2015   Procedure: EXCISION LIPOMAS ON ABDOMEN;  Surgeon:  Erroll Luna, MD;  Location: [facility];  Service: General;  Laterality: N/A;  . LUMBAR PUNCTURE  09/19/17   [facility], Dr. Curt Bears  . OOPHORECTOMY Left 09/28/2017   Procedure: OOPHORECTOMY;  Surgeon: Bobbye Charleston, MD;  Location: [facility];  Service: Gynecology;  Laterality: Left;  . ROBOTIC ASSISTED LAPAROSCOPIC LYSIS OF ADHESION N/A 09/28/2017   Procedure: ROBOTIC ASSISTED LAPAROSCOPIC LYSIS OF ADHESION;  Surgeon: Bobbye Charleston, MD;  Location: [facility];  Service: Gynecology;  Laterality: N/A;  . ROBOTIC ASSISTED TOTAL HYSTERECTOMY WITH BILATERAL  SALPINGO OOPHERECTOMY Bilateral 09/28/2017   Procedure: ROBOTIC ASSISTED TOTAL HYSTERECTOMY WITH BILATERAL SALPINGECTOMY;  Surgeon: Bobbye Charleston, MD;  Location: [facility];  Service: Gynecology;  Laterality: Bilateral;  . ROBOTICALLY ASSISTED LAPAROSCOPIC URETERAL RE-IMPLANTATION Right 02/22/2018   Procedure: ROBOTICALLY ASSISTED LAPAROSCOPIC URETERAL RE-IMPLANTATION;  Surgeon: Ceasar Mons, MD;  Location[facility];  Service: Urology;  Laterality: Right;  ONLY NEEDS 240 MIN TOTAL FOR ALL PROCEDURES  . TUBAL LIGATION    . WISDOM TOOTH EXTRACTION      There were no vitals filed for this visit.  Subjective Assessment - 08/29/19 1502    Subjective  Saw MD yesterday, gave me a sleeve for swelling and medication to help with swelling and pain. Pain is in and out. 3 steps on front porch and 2 steps out of yard and steps are still hard.    Currently in Pain?  Yes    Pain Score  4     Pain Location  Knee    Pain Orientation  Right    Pain Descriptors / Indicators  Sore    Aggravating Factors   stairs, daily activities    Pain Relieving Factors  rest                       [facility] Adult PT Treatment/Exercise - 08/29/19 0001      Knee/Hip Exercises: Stretches   Passive Hamstring Stretch Limitations  supine wtih strap    Piriformis Stretch Limitations  supine      Knee/Hip Exercises: Aerobic   Recumbent Bike  5 min L1      Knee/Hip Exercises: Supine   Straight Leg Raises  Right;15 reps    Straight Leg Raise with External Rotation  15 reps;Right      Knee/Hip Exercises: Sidelying   Hip ABduction  15 reps;Right    Hip ABduction Limitations  arcs    Other Sidelying Knee/Hip Exercises  sidelying circles      Knee/Hip Exercises: Prone   Hip Extension Limitations  with knee flexed      Cryotherapy   Number Minutes Cryotherapy  15 Minutes    Cryotherapy Location  Knee    Type of Cryotherapy  Ice pack      Manual Therapy   Manual Therapy  Edema management;Taping     Edema Management  around Rt patella    Kinesiotex  Edema      Kinesiotix   Edema  Rt knee               PT Short Term Goals - 08/08/19 0830      PT SHORT TERM GOAL #1   Title  knee extension to 0    Status  Achieved      PT SHORT TERM GOAL #2   Title  Pt will demonstrate proper heel-toe gait pattern    Baseline  good heel-toe without antalgic pattern for short distances    Status  Achieved      PT SHORT TERM GOAL #3   Title  pt will be independent with HEP as it has been created in the short term.    Status  Achieved        PT Long Term Goals - 08/08/19 0831      PT LONG TERM GOAL #1   Title  will tolerate walking for approx 45 min    Baseline  significant pain after short distances    Time  7    Period  Weeks    Status  New    Target Date  09/28/19      PT LONG TERM GOAL #2   Title  proper navigation of steps/stairs    Baseline  compensates due to pain at eval    Time  7    Period  Weeks    Status  New    Target Date  05/28/19      PT LONG TERM GOAL #3   Title  pain <=2/10    Baseline  mod-severe levels at this time    Time  7    Period  Weeks    Status  New    Target Date  05/28/19      PT LONG TERM GOAL #4   Title  sit to stand and begin walking without limitations    Baseline  "this is my biggest problem right now"    Time  7    Period  Weeks    Status  New    Target Date  09/28/19            Plan - 08/29/19 1544    Clinical Impression Statement  Significant edema noted today and addressed. altered HEP to [facility] strengthening and encouraged her to elevate her leg frequently even if it is not for the full 10-15 min.    PT Treatment/Interventions  ADLs/Self Care Home Management;Cryotherapy;Electrical Stimulation;Moist Heat;Gait training;Stair training;Functional mobility training;Therapeutic activities;Therapeutic exercise;Patient/family education;Neuromuscular re-education;Manual techniques;Scar mobilization;Passive range of  motion;Taping    PT Next Visit Plan  how did [facility] go? cont edema mgmt PRN    PT Home Exercise Plan  ankle movement, RICE, hip extension stretch in standing, ecc heel raises, diver, sit<>stand, SLS with opp ER/IR, HSS, gastroc stretch, piriformis stretch    Consulted and Agree with Plan of Care  Patient       Patient will benefit from skilled therapeutic intervention in order to improve the following deficits and impairments:  Abnormal gait, Decreased range of motion, Difficulty walking, Increased muscle spasms, Decreased activity tolerance, Pain, Improper body mechanics, Impaired flexibility, Decreased strength, Increased edema  Visit Diagnosis: S/P arthroscopic partial medial meniscectomy  Acute pain of right knee     Problem List Patient Active Problem List   Diagnosis Date Noted  . Acute medial meniscus tear of right knee 06/09/2019  . DM type 2 with diabetic peripheral neuropathy ([place])   . Acute pain of right knee 04/20/2019  . 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 Hyperlipidemia 01/20/2018  . Tobacco use disorder 01/19/2018  . Anxiety state 01/17/2018  . Abdominal pain, RLQ 10/03/2017  . Postoperative state 09/28/2017  . Diabetic neuropathy ([place]) 06/21/2017  . Anemia 06/20/2017  . Diabetes type 2, controlled ([place]) 05/16/2017  . H/O: C-section 05/16/2017  . Vitamin B12 deficiency 05/16/2017  . Health care maintenance 10/18/2016  . Psoriasis 10/01/2013  . Hypertension 09/26/2012  . Obesity 09/26/2012  . Subcutaneous nodule 09/26/2012  . History of abnormal Pap smear  09/26/2012   Kalief Kattner C. Vonzell Lindblad PT, DPT 08/29/19 3:46 PM   [facility] [address] [place], [place], [address] Phone: [phone]   Fax:  [phone]  Name: Marren Lull MRN: [id number] Date of Birth: 12-Apr-1981

## 2019-09-03 ENCOUNTER — Other Ambulatory Visit: Payer: Self-pay

## 2019-09-03 ENCOUNTER — Encounter: Payer: Self-pay | Admitting: Physical Therapy

## 2019-09-03 ENCOUNTER — Ambulatory Visit: Payer: Medicaid Other | Attending: Physician Assistant | Admitting: Physical Therapy

## 2019-09-03 DIAGNOSIS — Z9889 Other specified postprocedural states: Secondary | ICD-10-CM

## 2019-09-03 DIAGNOSIS — M25561 Pain in right knee: Secondary | ICD-10-CM | POA: Diagnosis present

## 2019-09-03 NOTE — Therapy (Addendum)
[place], [place], [address] Phone: [phone]   Fax:  [phone]  Physical Therapy Treatment/Discharge  Patient Details  Name: [name] MRN: [id number] Date of Birth: [birth year] Referring Provider (PT): Matthew Saras, [place]   Encounter Date: 09/03/2019  PT End of Session - 09/03/19 1322    Visit Number  5    Date for PT Re-Evaluation  09/28/19    Authorization Type  MCD    Authorization Time Period  10/20-11/30    Authorization - Visit Number  2    Authorization - Number of Visits  12    PT Start Time  0115    PT Stop Time  0205    PT Time Calculation (min)  50 min       Past Medical History:  Diagnosis Date  . Abdominal lipoma 05/2015  . Abdominal pain, RLQ 10/03/2017  . Acute medial meniscus tear of right knee 06/09/2019  . Anxiety   . Arthritis    lower back  . DM type 2 with diabetic peripheral neuropathy (HCC)    Borderline  . GERD (gastroesophageal reflux disease)   . Headache    Migraines  . History of MRSA infection ~ 2006   leg  . History of shingles   . Hx of hydronephrosis    following total hysterectomy surgery ; Right kidney ; see CT abdomen 10-02-17 epic; states " a plug had got damaged during the surgery so thats why they put the stent in"   . Hypertension    under control with med., has been on med. x 6 mos.  . Lumbosacral spondylolysis    see MRI 06-01-17 epic   . Mood swing   . Optic nerve swelling    seen by Dr Mora Bellman 09-07-17, did lumbar puncture 09-19-17; patient states " they never called me about results ut i have an appt with him in april"; patient also states  " my vision is still blurry and worse when i get the migraines, they think the migraines are causing it"  . Psoriasis     Past Surgical History:  Procedure Laterality Date  . CESAREAN SECTION  06/21/2008; 08/18/2010  . CESAREAN SECTION N/A 07/10/2014   Procedure: REPEAT CESAREAN SECTION;   Surgeon: Daria Pastures, MD;  Location: [facility];  Service: Obstetrics;  Laterality: N/A;  . CHOLECYSTECTOMY  07/10/2008  . CYSTOSCOPY N/A 09/28/2017   Procedure: CYSTOSCOPY;  Surgeon: Bobbye Charleston, MD;  Location: [facility];  Service: Gynecology;  Laterality: N/A;  . CYSTOSCOPY Right 01/18/2018   Procedure: CYSTOSCOPY, RETROGRADE / PYELOGRAM, URETEROSCOPY;  Surgeon: Ceasar Mons, MD;  Location: [facility];  Service: Urology;  Laterality: Right;  . CYSTOSCOPY W/ URETERAL STENT PLACEMENT Right 10/03/2017   Procedure: CYSTOSCOPY WITH RETROGRADE PYELOGRAM/URETERAL STENT PLACEMENT;  Surgeon: Ceasar Mons, MD;  Location: [facility];  Service: Urology;  Laterality: Right;  . CYSTOSCOPY W/ URETERAL STENT PLACEMENT Right 02/22/2018   Procedure: CYSTOSCOPY WITH URETERAL STENT PLACEMENT;  Surgeon: Ceasar Mons, MD;  Location: [facility];  Service: Urology;  Laterality: Right;  . KNEE ARTHROSCOPY WITH MEDIAL MENISECTOMY Right 06/26/2019   Procedure: KNEE ARTHROSCOPY WITH PARTIAL MEDIAL MENISECTOMY, REMOVAL LOOSE BODY;  Surgeon: Elsie Saas, MD;  Location: [facility];  Service: Orthopedics;  Laterality: Right;  . LIPOMA EXCISION  2014   back  . LIPOMA EXCISION N/A 05/21/2015   Procedure: EXCISION LIPOMAS ON ABDOMEN;  Surgeon: Erroll Luna, MD;  Location:  [facility];  Service: General;  Laterality: N/A;  . LUMBAR PUNCTURE  09/19/17   [facility], Dr. Curt Bears  . OOPHORECTOMY Left 09/28/2017   Procedure: OOPHORECTOMY;  Surgeon: Bobbye Charleston, MD;  Location: [facility];  Service: Gynecology;  Laterality: Left;  . ROBOTIC ASSISTED LAPAROSCOPIC LYSIS OF ADHESION N/A 09/28/2017   Procedure: ROBOTIC ASSISTED LAPAROSCOPIC LYSIS OF ADHESION;  Surgeon: Bobbye Charleston, MD;  Location: [facility];  Service: Gynecology;  Laterality: N/A;  . ROBOTIC ASSISTED TOTAL HYSTERECTOMY WITH BILATERAL SALPINGO OOPHERECTOMY Bilateral 09/28/2017    Procedure: ROBOTIC ASSISTED TOTAL HYSTERECTOMY WITH BILATERAL SALPINGECTOMY;  Surgeon: Bobbye Charleston, MD;  Location: [facility];  Service: Gynecology;  Laterality: Bilateral;  . ROBOTICALLY ASSISTED LAPAROSCOPIC URETERAL RE-IMPLANTATION Right 02/22/2018   Procedure: ROBOTICALLY ASSISTED LAPAROSCOPIC URETERAL RE-IMPLANTATION;  Surgeon: Ceasar Mons, MD;  Location[facility];  Service: Urology;  Laterality: Right;  ONLY NEEDS 240 MIN TOTAL FOR ALL PROCEDURES  . TUBAL LIGATION    . WISDOM TOOTH EXTRACTION      There were no vitals filed for this visit.  Subjective Assessment - 09/03/19 1320    Subjective  Not as swollen as I was last time. New exercises are going okay.    Currently in Pain?  Yes    Pain Score  3     Pain Location  Knee    Pain Orientation  Right    Pain Descriptors / Indicators  Aching    Aggravating Factors   stairs    Pain Relieving Factors  rest         [facility] PT Assessment - 09/03/19 0001      AROM   Right Knee Extension  0    Right Knee Flexion  134                   [facility] Adult PT Treatment/Exercise - 09/03/19 0001      Knee/Hip Exercises: Stretches   Passive Hamstring Stretch Limitations  supine wtih strap      Knee/Hip Exercises: Seated   Long Arc Quad  20 reps    Long Arc Quad Weight  3 lbs.    Sit to [employer]  10 reps      Knee/Hip Exercises: Supine   Quad Sets  20 reps    Short Arc Quad Sets  20 reps    Heel Slides  20 reps    Heel Slides Limitations  strap    Bridges  20 reps    Bridges Limitations  with ball squeeze     Straight Leg Raises  Right;15 reps    Straight Leg Raise with External Rotation  15 reps;Right      Knee/Hip Exercises: Sidelying   Hip ABduction  15 reps;Right    Clams  x15, right      Cryotherapy   Number Minutes Cryotherapy  10 Minutes    Cryotherapy Location  Knee    Type of Cryotherapy  --   vaso, no charge     Kinesiotix   Edema  Rt knee               PT Short Term Goals -  08/08/19 0830      PT SHORT TERM GOAL #1   Title  knee extension to 0    Status  Achieved      PT SHORT TERM GOAL #2   Title  Pt will demonstrate proper heel-toe gait pattern    Baseline  good heel-toe without antalgic pattern  for short distances    Status  Achieved      PT SHORT TERM GOAL #3   Title  pt will be independent with HEP as it has been created in the short term.    Status  Achieved        PT Long Term Goals - 08/08/19 0831      PT LONG TERM GOAL #1   Title  will tolerate walking for approx 45 min    Baseline  significant pain after short distances    Time  7    Period  Weeks    Status  New    Target Date  09/28/19      PT LONG TERM GOAL #2   Title  proper navigation of steps/stairs    Baseline  compensates due to pain at eval    Time  7    Period  Weeks    Status  New    Target Date  05/28/19      PT LONG TERM GOAL #3   Title  pain <=2/10    Baseline  mod-severe levels at this time    Time  7    Period  Weeks    Status  New    Target Date  05/28/19      PT LONG TERM GOAL #4   Title  sit to stand and begin walking without limitations    Baseline  "this is my biggest problem right now"    Time  7    Period  Weeks    Status  New    Target Date  09/28/19            Plan - 09/03/19 1404    Clinical Impression Statement  Pt arrives reporting less edema. She reports edema tape came off quickly. Tape reapplied today without moisturizer prior to application. Continued with mostly open chain strengthening and stretching.    PT Next Visit Plan  how did [facility] go? cont edema mgmt PRN    PT Home Exercise Plan  ankle movement, RICE, hip extension stretch in standing, ecc heel raises, diver, sit<>stand, SLS with opp ER/IR, HSS, gastroc stretch, piriformis stretch       Patient will benefit from skilled therapeutic intervention in order to improve the following deficits and impairments:  Abnormal gait, Decreased range of motion, Difficulty walking, Increased  muscle spasms, Decreased activity tolerance, Pain, Improper body mechanics, Impaired flexibility, Decreased strength, Increased edema  Visit Diagnosis: S/P arthroscopic partial medial meniscectomy  Acute pain of right knee     Problem List Patient Active Problem List   Diagnosis Date Noted  . Acute medial meniscus tear of right knee 06/09/2019  . DM type 2 with diabetic peripheral neuropathy ([place])   . Acute pain of right knee 04/20/2019  . 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 Hyperlipidemia 01/20/2018  . Tobacco use disorder 01/19/2018  . Anxiety state 01/17/2018  . Abdominal pain, RLQ 10/03/2017  . Postoperative state 09/28/2017  . Diabetic neuropathy ([place]) 06/21/2017  . Anemia 06/20/2017  . Diabetes type 2, controlled ([place]) 05/16/2017  . H/O: C-section 05/16/2017  . Vitamin B12 deficiency 05/16/2017  . Health care maintenance 10/18/2016  . Psoriasis 10/01/2013  . Hypertension 09/26/2012  . Obesity 09/26/2012  . Subcutaneous nodule 09/26/2012  . History of abnormal Pap smear 09/26/2012    Dorene Ar, PTA 09/03/2019, 2:07 PM  [facility] [address] [place], [place], [address] Phone: [phone]   Fax:  [phone]  Name: Kassi Esteve MRN: [id number] Date of Birth: [birth year]  PHYSICAL THERAPY DISCHARGE SUMMARY  Visits from Start of Care: 5  Current functional level related to goals / functional outcomes: See above   Remaining deficits: See above   Education / Equipment: Anatomy of condition, POC, HEP, exercise form/rationale  Plan: Patient agrees to discharge.  Patient goals were not met. Patient is being discharged due to a change in medical status.  ?????    Spoke with pt over the phone, she reports she "slipped her disk" in her back and is hardly able to move. She would like to d/c until she can get control of her back and will return with a new Rx when appropriate.   Fujiko Picazo C.  Hightower PT, DPT 09/19/19 12:32 PM

## 2019-09-06 ENCOUNTER — Ambulatory Visit: Payer: Medicaid Other | Admitting: Physical Therapy

## 2019-09-09 ENCOUNTER — Emergency Department (HOSPITAL_COMMUNITY)
Admission: EM | Admit: 2019-09-09 | Discharge: 2019-09-09 | Disposition: A | Discharge status: Home / Self Care | Payer: Medicaid Other | Attending: Emergency Medicine | Admitting: Emergency Medicine

## 2019-09-09 ENCOUNTER — Other Ambulatory Visit: Payer: Self-pay

## 2019-09-09 DIAGNOSIS — E119 Type 2 diabetes mellitus without complications: Secondary | ICD-10-CM | POA: Diagnosis not present

## 2019-09-09 DIAGNOSIS — F17210 Nicotine dependence, cigarettes, uncomplicated: Secondary | ICD-10-CM | POA: Insufficient documentation

## 2019-09-09 DIAGNOSIS — I10 Essential (primary) hypertension: Secondary | ICD-10-CM | POA: Insufficient documentation

## 2019-09-09 DIAGNOSIS — E1140 Type 2 diabetes mellitus with diabetic neuropathy, unspecified: Secondary | ICD-10-CM | POA: Insufficient documentation

## 2019-09-09 DIAGNOSIS — Z7901 Long term (current) use of anticoagulants: Secondary | ICD-10-CM | POA: Insufficient documentation

## 2019-09-09 DIAGNOSIS — M5440 Lumbago with sciatica, unspecified side: Secondary | ICD-10-CM | POA: Insufficient documentation

## 2019-09-09 DIAGNOSIS — M545 Low back pain: Secondary | ICD-10-CM | POA: Diagnosis present

## 2019-09-09 MED ORDER — PREDNISONE 20 MG PO TABS
60.0000 mg | ORAL_TABLET | Freq: Once | ORAL | Status: AC
  Administered 2019-09-09: 60 mg via ORAL
  Filled 2019-09-09: qty 3

## 2019-09-09 MED ORDER — LIDOCAINE 5 % EX PTCH
1.0000 | MEDICATED_PATCH | Freq: Once | CUTANEOUS | Status: DC
  Administered 2019-09-09: 20:00:00 1 via TRANSDERMAL
  Filled 2019-09-09: qty 1

## 2019-09-09 MED ORDER — KETOROLAC TROMETHAMINE 30 MG/ML IJ SOLN
30.0000 mg | Freq: Once | INTRAMUSCULAR | Status: AC
  Administered 2019-09-09: 30 mg via INTRAMUSCULAR
  Filled 2019-09-09: qty 1

## 2019-09-09 MED ORDER — LIDOCAINE 5 % EX PTCH: 1.0000 | MEDICATED_PATCH | CUTANEOUS | 0 refills | Status: DC

## 2019-09-09 MED ORDER — PREDNISONE 10 MG PO TABS: 20.0000 mg | ORAL_TABLET | Freq: Every day | ORAL | 0 refills | Status: AC

## 2019-09-09 MED ORDER — CYCLOBENZAPRINE HCL 10 MG PO TABS: 10.0000 mg | ORAL_TABLET | Freq: Two times a day (BID) | ORAL | 0 refills | Status: DC | PRN

## 2019-09-09 MED ORDER — CYCLOBENZAPRINE HCL 10 MG PO TABS
5.0000 mg | ORAL_TABLET | Freq: Once | ORAL | Status: AC
  Administered 2019-09-09: 20:00:00 5 mg via ORAL
  Filled 2019-09-09: qty 1

## 2019-09-09 NOTE — Discharge Instructions (Addendum)
You have been seen today for back pain. Please read and follow all provided instructions. Return to the emergency room for worsening condition or new concerning symptoms.    1. Medications:  -Prescription sent to your pharmacy for Flexeril.  This is a muscle relaxer.  Please take as prescribed and only if needed.  You cannot drive or work while taking this medication as it can make you drowsy.  Most people take these before bed to help him sleep. -Prescription also sent for prednisone.  This is a steroid.  Is used to help with inflammation that can be the cause of your back pain.  Please take as prescribed. -Prescription also sent for Lidoderm patches.  Please use as prescribed.  If these are too expensive you can also buy over-the-counter creams or patches. Continue usual home medications Take medications as prescribed. Please review all of the medicines and only take them if you do not have an allergy to them.   2. Treatment: rest, drink plenty of fluids  3. Follow Up: Please follow up with your primary doctor in 2-5 days for discussion of your diagnoses and further evaluation after today's visit; Call today to arrange your follow up.  -Also recommend you follow-up with Dr. Christella Noa.  He is a Publishing rights manager.  I have given his information in your discharge paperwork for you to call his office if needed.  If you call please mention this is a follow-up appointment from an emergency room visit to help in scheduling you an appointment.  It is also a possibility that you have an allergic reaction to any of the medicines that you have been prescribed - Everybody reacts differently to medications and while MOST people have no trouble with most medicines, you may have a reaction such as nausea, vomiting, rash, swelling, shortness of breath. If this is the case, please stop taking the medicine immediately and contact your physician.  ?

## 2019-09-09 NOTE — ED Notes (Addendum)
Patient verbalizes understanding of discharge instructions. Opportunity for questioning and answers were provided. Armband removed by staff, pt discharged from ED. Pt endorses significant relief following medication administration. Pt able to ambulate with steady gait.

## 2019-09-09 NOTE — ED Triage Notes (Signed)
Pt arrives via POV with c/o lower back pain since Thursday, denies specific injury. No numbness, tingling or loss of bowel/bladder.

## 2019-09-09 NOTE — ED Provider Notes (Signed)
[facility] EMERGENCY DEPARTMENT Provider Note   CSN: [id number] Arrival date & time: 09/09/19  1644     History   Chief Complaint Chief Complaint  Patient presents with  . Back Pain    HPI [name] is a 38 y.o. female with past medical history as listed below including chronic back pain presents emergency room today with chief complaint of back pain.  Pain is been present x3 days.  She states the day before symptom onset she had a procedure done at her GYN office and had to lay on her back on a metal table for "a while".  She states the next day she had pain in her lower back.  Pain radiates down posterior legs.  She states this pain feels similar to her chronic back pain.  She has a history of slipped disc in her 47s, no surgical intervention was ever performed.  She tried taking hydrocodone for her pain that she has leftover from a previous knee surgery.  She states this took the edge off but pain has persisted.  She rates the pain 8 out of 10 in severity.  Pain is worse with movement.   Denies fevers, weight loss, numbness/weakness of upper and lower extremities, bowel/bladder incontinence, urinary retention, history of cancer, saddle anesthesia, history of back surgery, history of IVDA. History provided by patient with additional history obtained from chart review.      Past Medical History:  Diagnosis Date  . Abdominal lipoma 05/2015  . Abdominal pain, RLQ 10/03/2017  . Acute medial meniscus tear of right knee 06/09/2019  . Anxiety   . Arthritis    lower back  . DM type 2 with diabetic peripheral neuropathy (HCC)    Borderline  . GERD (gastroesophageal reflux disease)   . Headache    Migraines  . History of MRSA infection ~ 2006   leg  . History of shingles   . Hx of hydronephrosis    following total hysterectomy surgery ; Right kidney ; see CT abdomen 10-02-17 epic; states " a plug had got damaged during the surgery so thats why they put the  stent in"   . Hypertension    under control with med., has been on med. x 6 mos.  . Lumbosacral spondylolysis    see MRI 06-01-17 epic   . Mood swing   . Optic nerve swelling    seen by Dr Mora Bellman 09-07-17, did lumbar puncture 09-19-17; patient states " they never called me about results ut i have an appt with him in april"; patient also states  " my vision is still blurry and worse when i get the migraines, they think the migraines are causing it"  . Psoriasis     Patient Active Problem List   Diagnosis Date Noted  . Acute medial meniscus tear of right knee 06/09/2019  . DM type 2 with diabetic peripheral neuropathy ([place])   . Acute pain of right knee 04/20/2019  . 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 Hyperlipidemia 01/20/2018  . Tobacco use disorder 01/19/2018  . Anxiety state 01/17/2018  . Abdominal pain, RLQ 10/03/2017  . Postoperative state 09/28/2017  . Diabetic neuropathy ([place]) 06/21/2017  . Anemia 06/20/2017  . Diabetes type 2, controlled ([place]) 05/16/2017  . H/O: C-section 05/16/2017  . Vitamin B12 deficiency 05/16/2017  . Health care maintenance 10/18/2016  . Psoriasis 10/01/2013  . Hypertension 09/26/2012  . Obesity 09/26/2012  . Subcutaneous nodule 09/26/2012  . History of abnormal Pap smear  09/26/2012    Past Surgical History:  Procedure Laterality Date  . CESAREAN SECTION  06/21/2008; 08/18/2010  . CESAREAN SECTION N/A 07/10/2014   Procedure: REPEAT CESAREAN SECTION;  Surgeon: Daria Pastures, MD;  Location: [facility];  Service: Obstetrics;  Laterality: N/A;  . CHOLECYSTECTOMY  07/10/2008  . CYSTOSCOPY N/A 09/28/2017   Procedure: CYSTOSCOPY;  Surgeon: Bobbye Charleston, MD;  Location: [facility];  Service: Gynecology;  Laterality: N/A;  . CYSTOSCOPY Right 01/18/2018   Procedure: CYSTOSCOPY, RETROGRADE / PYELOGRAM, URETEROSCOPY;  Surgeon: Ceasar Mons, MD;  Location: [facility];  Service: Urology;  Laterality: Right;  . CYSTOSCOPY W/ URETERAL  STENT PLACEMENT Right 10/03/2017   Procedure: CYSTOSCOPY WITH RETROGRADE PYELOGRAM/URETERAL STENT PLACEMENT;  Surgeon: Ceasar Mons, MD;  Location: [facility];  Service: Urology;  Laterality: Right;  . CYSTOSCOPY W/ URETERAL STENT PLACEMENT Right 02/22/2018   Procedure: CYSTOSCOPY WITH URETERAL STENT PLACEMENT;  Surgeon: Ceasar Mons, MD;  Location: [facility];  Service: Urology;  Laterality: Right;  . KNEE ARTHROSCOPY WITH MEDIAL MENISECTOMY Right 06/26/2019   Procedure: KNEE ARTHROSCOPY WITH PARTIAL MEDIAL MENISECTOMY, REMOVAL LOOSE BODY;  Surgeon: Elsie Saas, MD;  Location: [facility];  Service: Orthopedics;  Laterality: Right;  . LIPOMA EXCISION  2014   back  . LIPOMA EXCISION N/A 05/21/2015   Procedure: EXCISION LIPOMAS ON ABDOMEN;  Surgeon: Erroll Luna, MD;  Location: [facility];  Service: General;  Laterality: N/A;  . LUMBAR PUNCTURE  09/19/17   [facility], Dr. Curt Bears  . OOPHORECTOMY Left 09/28/2017   Procedure: OOPHORECTOMY;  Surgeon: Bobbye Charleston, MD;  Location: [facility];  Service: Gynecology;  Laterality: Left;  . ROBOTIC ASSISTED LAPAROSCOPIC LYSIS OF ADHESION N/A 09/28/2017   Procedure: ROBOTIC ASSISTED LAPAROSCOPIC LYSIS OF ADHESION;  Surgeon: Bobbye Charleston, MD;  Location: [facility];  Service: Gynecology;  Laterality: N/A;  . ROBOTIC ASSISTED TOTAL HYSTERECTOMY WITH BILATERAL SALPINGO OOPHERECTOMY Bilateral 09/28/2017   Procedure: ROBOTIC ASSISTED TOTAL HYSTERECTOMY WITH BILATERAL SALPINGECTOMY;  Surgeon: Bobbye Charleston, MD;  Location: [facility];  Service: Gynecology;  Laterality: Bilateral;  . ROBOTICALLY ASSISTED LAPAROSCOPIC URETERAL RE-IMPLANTATION Right 02/22/2018   Procedure: ROBOTICALLY ASSISTED LAPAROSCOPIC URETERAL RE-IMPLANTATION;  Surgeon: Ceasar Mons, MD;  Location: [facility];  Service: Urology;  Laterality: Right;  ONLY NEEDS 240 MIN TOTAL FOR ALL PROCEDURES  . TUBAL LIGATION    . WISDOM  TOOTH EXTRACTION       OB History    Gravida  8   Para  3   Term  3   Preterm      AB  5   Living  3     SAB  5   TAB      Ectopic      Multiple      Live Births  3            Home Medications    Prior to Admission medications   Medication Sig Start Date End Date Taking? Authorizing Provider  apixaban (ELIQUIS) 2.5 MG TABS tablet 1 tablet twice a day for 2 weeks to prevent post op blood clot 06/26/19   Shepperson, Kirstin, PA-C  cyclobenzaprine (FLEXERIL) 10 MG tablet Take 1 tablet (10 mg total) by mouth 2 (two) times daily as needed for up to 7 days for muscle spasms. 09/09/19 09/16/19  Albrizze, Kaitlyn E, PA-C  estradiol (ESTRACE) 1 MG tablet Take 1 mg by mouth daily. 02/13/19   [provider]  HYDROcodone-acetaminophen (NORCO/VICODIN) 5-325 MG  tablet Take 1 tablet by mouth every 4 (four) hours as needed for moderate pain. 06/26/19 06/25/20  Shepperson, Kirstin, PA-C  lidocaine (LIDODERM) 5 % Place 1 patch onto the skin daily. Remove & Discard patch within 12 hours or as directed by MD 09/09/19   Albrizze, Verline Lema E, PA-C  OVER THE COUNTER MEDICATION Apply 1 application topically 3 (three) times daily as needed (psoraisis). OTC psoriasis cream    [provider]  predniSONE (DELTASONE) 10 MG tablet Take 2 tablets (20 mg total) by mouth daily for 5 days. 09/09/19 09/14/19  Albrizze, Kaitlyn E, PA-C  propranolol (INDERAL) 40 MG tablet TAKE 1 TABLET BY MOUTH EVERY DAY 04/12/19   Nuala Alpha, DO    Family History Family History  Problem Relation Age of Onset  . Drug abuse Mother   . Heart disease Maternal Grandmother   . Heart disease Maternal Grandfather     Social History Social History   Tobacco Use  . Smoking status: Current Some Day Smoker    Packs/day: 0.50    Years: 16.00    Pack years: 8.00    Types: Cigarettes    Start date: 05/01/2014  . Smokeless tobacco: Never Used  Substance Use Topics  . Alcohol use: No    Alcohol/week:  0.0 standard drinks  . Drug use: No     Allergies   Bee venom, Penicillins, Mushroom extract complex, Ciprofloxacin, and Powder   Review of Systems Review of Systems  Constitutional: Negative for chills, diaphoresis, fever and unexpected weight change.  Respiratory: Negative for shortness of breath.   Cardiovascular: Negative for chest pain.  Musculoskeletal: Positive for back pain. Negative for arthralgias, joint swelling, myalgias and neck pain.  Skin: Negative for wound.  Neurological: Negative for weakness and numbness.     Physical Exam Updated Vital Signs BP (!) 174/103 (BP Location: Left Arm)   Pulse 79   Temp 99 F (37.2 C)   Resp 20   LMP 09/11/2017 (Approximate)   SpO2 97%   Physical Exam Vitals signs and nursing note reviewed.  Constitutional:      General: She is not in acute distress.    Appearance: She is not ill-appearing.  HENT:     Head: Normocephalic and atraumatic.     Right Ear: Tympanic membrane and external ear normal.     Left Ear: Tympanic membrane and external ear normal.     Nose: Nose normal.     Mouth/Throat:     Mouth: Mucous membranes are moist.     Pharynx: Oropharynx is clear.  Eyes:     General: No scleral icterus.       Right eye: No discharge.        Left eye: No discharge.     Extraocular Movements: Extraocular movements intact.     Conjunctiva/sclera: Conjunctivae normal.     Pupils: Pupils are equal, round, and reactive to light.  Neck:     Musculoskeletal: Normal range of motion.     Vascular: No JVD.  Cardiovascular:     Rate and Rhythm: Normal rate and regular rhythm.     Pulses: Normal pulses.          Radial pulses are 2+ on the right side and 2+ on the left side.       Dorsalis pedis pulses are 2+ on the right side and 2+ on the left side.     Heart sounds: Normal heart sounds.  Pulmonary:     Comments: Lungs clear  to auscultation in all fields. Symmetric chest rise. No wheezing, rales, or rhonchi. Abdominal:      Comments: Abdomen is soft, non-distended, and non-tender in all quadrants. No rigidity, no guarding. No peritoneal signs.  Musculoskeletal: Normal range of motion.       Arms:     Right lower leg: No edema.     Left lower leg: No edema.     Comments: Tenderness to palpation as depicted in image above.  No overlying erythema, edema or wound to back.  No midline tenderness to thoracic or spinous processes.  Positive straight leg raise test on the left.  Negative on the right.  Patient ambulates using a cane because she had a recent knee surgery, mildly antalgic gait.   Skin:    General: Skin is warm and dry.     Capillary Refill: Capillary refill takes less than 2 seconds.  Neurological:     Mental Status: She is oriented to person, place, and time.     GCS: GCS eye subscore is 4. GCS verbal subscore is 5. GCS motor subscore is 6.     Comments: Fluent speech, no facial droop.  Psychiatric:        Behavior: Behavior normal.      ED Treatments / Results  Labs (all labs ordered are listed, but only abnormal results are displayed) Labs Reviewed - No data to display  EKG None  Radiology No results found.  Procedures Procedures (including critical care time)  Medications Ordered in ED Medications  cyclobenzaprine (FLEXERIL) tablet 5 mg (has no administration in time range)  predniSONE (DELTASONE) tablet 60 mg (has no administration in time range)  lidocaine (LIDODERM) 5 % 1 patch (has no administration in time range)  ketorolac (TORADOL) 30 MG/ML injection 30 mg (has no administration in time range)     Initial Impression / Assessment and Plan / ED Course  I have reviewed the triage vital signs and the nursing notes.  Pertinent labs & imaging results that were available during my care of the patient were reviewed by me and considered in my medical decision making (see chart for details).  Patient seen and examined.  She is well-appearing, no acute distress.  Normal  neurological exam, no evidence of urinary incontinence or retention, pain is consistently reproducible. There is no evidence of AAA or concern for dissection at this time.   Patient can walk but states is painful.  No loss of bowel or bladder control.  No concern for cauda equina.  No fever, night sweats, weight loss, h/o cancer, IVDU.  Pain treated here in the department with adequate improvement. RICE protocol and will discharge home with prescription for prednisone and muscle relaxers.  I have also discussed reasons to return immediately to the ER.  Patient expresses understanding and agrees with plan.  Recommend patient follow-up with PCP or neurosurgery outpatient if symptoms persist.    Final Clinical Impressions(s) / ED Diagnoses   Final diagnoses:  Acute bilateral low back pain with sciatica, sciatica laterality unspecified    ED Discharge Orders         Ordered    cyclobenzaprine (FLEXERIL) 10 MG tablet  2 times daily PRN     09/09/19 1914    predniSONE (DELTASONE) 10 MG tablet  Daily     09/09/19 1914    lidocaine (LIDODERM) 5 %  Every 24 hours     09/09/19 1914           Albrizze, [place] E,  PA-C 09/09/19 2002    Sherwood Gambler, MD 09/09/19 2342

## 2019-09-10 ENCOUNTER — Ambulatory Visit: Payer: Medicaid Other | Admitting: Physical Therapy

## 2019-09-12 ENCOUNTER — Other Ambulatory Visit: Payer: Self-pay

## 2019-09-12 ENCOUNTER — Emergency Department (HOSPITAL_COMMUNITY)
Admission: EM | Admit: 2019-09-12 | Discharge: 2019-09-12 | Disposition: A | Discharge status: Home / Self Care | Payer: Medicaid Other | Attending: Emergency Medicine | Admitting: Emergency Medicine

## 2019-09-12 ENCOUNTER — Ambulatory Visit: Payer: Medicaid Other | Admitting: Physical Therapy

## 2019-09-12 ENCOUNTER — Emergency Department (HOSPITAL_COMMUNITY): Payer: Medicaid Other

## 2019-09-12 DIAGNOSIS — Z79899 Other long term (current) drug therapy: Secondary | ICD-10-CM | POA: Insufficient documentation

## 2019-09-12 DIAGNOSIS — M62830 Muscle spasm of back: Secondary | ICD-10-CM

## 2019-09-12 DIAGNOSIS — Z7901 Long term (current) use of anticoagulants: Secondary | ICD-10-CM | POA: Diagnosis not present

## 2019-09-12 DIAGNOSIS — F17210 Nicotine dependence, cigarettes, uncomplicated: Secondary | ICD-10-CM | POA: Diagnosis not present

## 2019-09-12 DIAGNOSIS — I10 Essential (primary) hypertension: Secondary | ICD-10-CM | POA: Diagnosis not present

## 2019-09-12 DIAGNOSIS — M545 Low back pain: Secondary | ICD-10-CM | POA: Diagnosis present

## 2019-09-12 DIAGNOSIS — E119 Type 2 diabetes mellitus without complications: Secondary | ICD-10-CM | POA: Insufficient documentation

## 2019-09-12 MED ORDER — OXYCODONE-ACETAMINOPHEN 5-325 MG PO TABS
1.0000 | ORAL_TABLET | Freq: Once | ORAL | Status: AC
  Administered 2019-09-12: 1 via ORAL
  Filled 2019-09-12: qty 1

## 2019-09-12 MED ORDER — BACLOFEN 10 MG PO TABS: 10.0000 mg | ORAL_TABLET | Freq: Three times a day (TID) | ORAL | 0 refills | Status: DC

## 2019-09-12 MED ORDER — OXYCODONE-ACETAMINOPHEN 5-325 MG PO TABS: 1.0000 | ORAL_TABLET | Freq: Four times a day (QID) | ORAL | 0 refills | Status: DC | PRN

## 2019-09-12 NOTE — Discharge Instructions (Signed)
SEEK IMMEDIATE MEDICAL ATTENTION IF: New numbness, tingling, weakness, or problem with the use of your arms or legs.  Severe back pain not relieved with medications.  Change in bowel or bladder control.  Increasing pain in any areas of the body (such as chest or abdominal pain).  Shortness of breath, dizziness or fainting.  Nausea (feeling sick to your stomach), vomiting, fever, or sweats.  

## 2019-09-12 NOTE — ED Triage Notes (Signed)
Pt endorses on going back pain due to a slipped disc in her back x 5 days. Here for same Sunday and saw her neurosurgeon yesterday, pt is supposed to have an MRI but insurance has not approved it yet. Pt here due to pain going further up the spine. VSS

## 2019-09-12 NOTE — ED Notes (Signed)
Patient verbalizes understanding of discharge instructions. Opportunity for questioning and answers were provided. Armband removed by staff, pt discharged from ED ambulatory.   

## 2019-09-12 NOTE — ED Provider Notes (Signed)
Patient placed in Quick Look pathway, seen and evaluated   Chief Complaint: Bilateral low back pain with sciatica  HPI:   Bilateral low back pain with sciatica that patient attributes to known "slipped disc". Seen here 09/09/2019 for same pain worsened since discharge. Went to neurosurgeon yesterday and planned for MRI but waiting for insurance approval. Pain shoots to bilateral thighs.  ROS: + back pain           - headache, neck pain, cp/sob, abd pain, n/v/d, saddle paresthesias, incontinence, retention, IVDU, fv/chills or any additional concerns.  Physical Exam:   Gen: No distress  Neuro: Awake and Alert  Skin: Warm    Focused Exam: Bilateral paraspinal lumbar TTP. No midline C/T/L spinal tenderness to palpation, no deformity, crepitus, or step-off noted. Abd soft and nontender.   Initiation of care has begun. The patient has been counseled on the process, plan, and necessity for staying for the completion/evaluation, and the remainder of the medical screening examination   [name] 09/12/19 [place], [place], MD 09/16/19 1820

## 2019-09-12 NOTE — ED Provider Notes (Signed)
[facility] EMERGENCY DEPARTMENT Provider Note   CSN: [id number] Arrival date & time: 09/12/19  1107     History   Chief Complaint Chief Complaint  Patient presents with  . Back Pain    HPI [name] is a 38 y.o. female who presents the emergency department with chief complaint of back pain.  Patient states that she has a known "slipped disc."  Chronically she has low back pain with radiation to the bilateral anterior thighs but denies weakness, numbness, paresthesia, saddle anesthesia, or bowel or bladder incontinence.  She was seen in the emergency department on 09/09/2019 for acute onset lower back pain.  Patient states that she awoke with inability to move her back due to severe onset of pain.  She had a secondary episode when she was sitting on the toilet and her back "locked up."  She was unable to get up from the toilet without help.  She was seen yesterday by neurosurgery who has her scheduled for an MRI.  Patient states that she now has pain radiating up and down her spine.  It does not radiate into her legs or upper extremities.  Is worse whenever she tries to twist or move.  She has been taking prednisone, and muscle relaxer without significant relief.    HPI  Past Medical History:  Diagnosis Date  . Abdominal lipoma 05/2015  . Abdominal pain, RLQ 10/03/2017  . Acute medial meniscus tear of right knee 06/09/2019  . Anxiety   . Arthritis    lower back  . DM type 2 with diabetic peripheral neuropathy (HCC)    Borderline  . GERD (gastroesophageal reflux disease)   . Headache    Migraines  . History of MRSA infection ~ 2006   leg  . History of shingles   . Hx of hydronephrosis    following total hysterectomy surgery ; Right kidney ; see CT abdomen 10-02-17 epic; states " a plug had got damaged during the surgery so thats why they put the stent in"   . Hypertension    under control with med., has been on med. x 6 mos.  . Lumbosacral spondylolysis     see MRI 06-01-17 epic   . Mood swing   . Optic nerve swelling    seen by Dr Mora Bellman 09-07-17, did lumbar puncture 09-19-17; patient states " they never called me about results ut i have an appt with him in april"; patient also states  " my vision is still blurry and worse when i get the migraines, they think the migraines are causing it"  . Psoriasis     Patient Active Problem List   Diagnosis Date Noted  . Acute medial meniscus tear of right knee 06/09/2019  . DM type 2 with diabetic peripheral neuropathy ([place])   . Acute pain of right knee 04/20/2019  . 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 Hyperlipidemia 01/20/2018  . Tobacco use disorder 01/19/2018  . Anxiety state 01/17/2018  . Abdominal pain, RLQ 10/03/2017  . Postoperative state 09/28/2017  . Diabetic neuropathy ([place]) 06/21/2017  . Anemia 06/20/2017  . Diabetes type 2, controlled ([place]) 05/16/2017  . H/O: C-section 05/16/2017  . Vitamin B12 deficiency 05/16/2017  . Health care maintenance 10/18/2016  . Psoriasis 10/01/2013  . Hypertension 09/26/2012  . Obesity 09/26/2012  . Subcutaneous nodule 09/26/2012  . History of abnormal Pap smear 09/26/2012    Past Surgical History:  Procedure Laterality Date  . CESAREAN SECTION  06/21/2008; 08/18/2010  .  CESAREAN SECTION N/A 07/10/2014   Procedure: REPEAT CESAREAN SECTION;  Surgeon: Daria Pastures, MD;  Location: [facility];  Service: Obstetrics;  Laterality: N/A;  . CHOLECYSTECTOMY  07/10/2008  . CYSTOSCOPY N/A 09/28/2017   Procedure: CYSTOSCOPY;  Surgeon: Bobbye Charleston, MD;  Location: [facility];  Service: Gynecology;  Laterality: N/A;  . CYSTOSCOPY Right 01/18/2018   Procedure: CYSTOSCOPY, RETROGRADE / PYELOGRAM, URETEROSCOPY;  Surgeon: Ceasar Mons, MD;  Location: [facility];  Service: Urology;  Laterality: Right;  . CYSTOSCOPY W/ URETERAL STENT PLACEMENT Right 10/03/2017   Procedure: CYSTOSCOPY WITH RETROGRADE PYELOGRAM/URETERAL STENT PLACEMENT;   Surgeon: Ceasar Mons, MD;  Location: [facility];  Service: Urology;  Laterality: Right;  . CYSTOSCOPY W/ URETERAL STENT PLACEMENT Right 02/22/2018   Procedure: CYSTOSCOPY WITH URETERAL STENT PLACEMENT;  Surgeon: Ceasar Mons, MD;  Location: [facility];  Service: Urology;  Laterality: Right;  . KNEE ARTHROSCOPY WITH MEDIAL MENISECTOMY Right 06/26/2019   Procedure: KNEE ARTHROSCOPY WITH PARTIAL MEDIAL MENISECTOMY, REMOVAL LOOSE BODY;  Surgeon: Elsie Saas, MD;  Location: [facility];  Service: Orthopedics;  Laterality: Right;  . LIPOMA EXCISION  2014   back  . LIPOMA EXCISION N/A 05/21/2015   Procedure: EXCISION LIPOMAS ON ABDOMEN;  Surgeon: Erroll Luna, MD;  Location: [facility];  Service: General;  Laterality: N/A;  . LUMBAR PUNCTURE  09/19/17   [facility], Dr. Curt Bears  . OOPHORECTOMY Left 09/28/2017   Procedure: OOPHORECTOMY;  Surgeon: Bobbye Charleston, MD;  Location: [facility];  Service: Gynecology;  Laterality: Left;  . ROBOTIC ASSISTED LAPAROSCOPIC LYSIS OF ADHESION N/A 09/28/2017   Procedure: ROBOTIC ASSISTED LAPAROSCOPIC LYSIS OF ADHESION;  Surgeon: Bobbye Charleston, MD;  Location: [facility];  Service: Gynecology;  Laterality: N/A;  . ROBOTIC ASSISTED TOTAL HYSTERECTOMY WITH BILATERAL SALPINGO OOPHERECTOMY Bilateral 09/28/2017   Procedure: ROBOTIC ASSISTED TOTAL HYSTERECTOMY WITH BILATERAL SALPINGECTOMY;  Surgeon: Bobbye Charleston, MD;  Location: [facility];  Service: Gynecology;  Laterality: Bilateral;  . ROBOTICALLY ASSISTED LAPAROSCOPIC URETERAL RE-IMPLANTATION Right 02/22/2018   Procedure: ROBOTICALLY ASSISTED LAPAROSCOPIC URETERAL RE-IMPLANTATION;  Surgeon: Ceasar Mons, MD;  Location: [facility];  Service: Urology;  Laterality: Right;  ONLY NEEDS 240 MIN TOTAL FOR ALL PROCEDURES  . TUBAL LIGATION    . WISDOM TOOTH EXTRACTION       OB History    Gravida  8   Para  3   Term  3   Preterm      AB  5   Living   3     SAB  5   TAB      Ectopic      Multiple      Live Births  3            Home Medications    Prior to Admission medications   Medication Sig Start Date End Date Taking? Authorizing Provider  apixaban (ELIQUIS) 2.5 MG TABS tablet 1 tablet twice a day for 2 weeks to prevent post op blood clot 06/26/19   Shepperson, Kirstin, PA-C  cyclobenzaprine (FLEXERIL) 10 MG tablet Take 1 tablet (10 mg total) by mouth 2 (two) times daily as needed for up to 7 days for muscle spasms. 09/09/19 09/16/19  Albrizze, Kaitlyn E, PA-C  estradiol (ESTRACE) 1 MG tablet Take 1 mg by mouth daily. 02/13/19   [provider]  HYDROcodone-acetaminophen (NORCO/VICODIN) 5-325 MG tablet Take 1 tablet by mouth every 4 (four) hours as needed for moderate pain. 06/26/19 06/25/20  Shepperson, Dellis Anes,  PA-C  lidocaine (LIDODERM) 5 % Place 1 patch onto the skin daily. Remove & Discard patch within 12 hours or as directed by MD 09/09/19   Albrizze, Verline Lema E, PA-C  OVER THE COUNTER MEDICATION Apply 1 application topically 3 (three) times daily as needed (psoraisis). OTC psoriasis cream    [provider]  predniSONE (DELTASONE) 10 MG tablet Take 2 tablets (20 mg total) by mouth daily for 5 days. 09/09/19 09/14/19  Albrizze, Kaitlyn E, PA-C  propranolol (INDERAL) 40 MG tablet TAKE 1 TABLET BY MOUTH EVERY DAY 04/12/19   Nuala Alpha, DO    Family History Family History  Problem Relation Age of Onset  . Drug abuse Mother   . Heart disease Maternal Grandmother   . Heart disease Maternal Grandfather     Social History Social History   Tobacco Use  . Smoking status: Current Some Day Smoker    Packs/day: 0.50    Years: 16.00    Pack years: 8.00    Types: Cigarettes    Start date: 05/01/2014  . Smokeless tobacco: Never Used  Substance Use Topics  . Alcohol use: No    Alcohol/week: 0.0 standard drinks  . Drug use: No     Allergies   Bee venom, Penicillins, Mushroom extract complex,  Ciprofloxacin, and Powder   Review of Systems Review of Systems  Ten systems reviewed and are negative for acute change, except as noted in the HPI.   Physical Exam Updated Vital Signs BP (!) 164/98 (BP Location: Left Arm)   Pulse 84   Temp 98 F (36.7 C) (Oral)   Resp 16   LMP 09/11/2017 (Approximate)   SpO2 98%   Physical Exam Vitals signs and nursing note reviewed.  Constitutional:      General: She is not in acute distress.    Appearance: She is well-developed. She is not diaphoretic.  HENT:     Head: Normocephalic and atraumatic.  Eyes:     General: No scleral icterus.    Conjunctiva/sclera: Conjunctivae normal.  Neck:     Musculoskeletal: Normal range of motion.  Cardiovascular:     Rate and Rhythm: Normal rate and regular rhythm.     Heart sounds: Normal heart sounds. No murmur. No friction rub. No gallop.   Pulmonary:     Effort: Pulmonary effort is normal. No respiratory distress.     Breath sounds: Normal breath sounds.  Abdominal:     General: Bowel sounds are normal. There is no distension.     Palpations: Abdomen is soft. There is no mass.     Tenderness: There is no abdominal tenderness. There is no guarding.  Musculoskeletal:     Comments: No midline spinal tenderness.  Tenderness and spasm of the entire bilateral paraspinal muscles in the L, T and C-spine.  Range of motion of the thoracic and lumbar spine is limited due to spasm.  Range of motion the neck is better.  She has no weakness of the lower or upper extremities.  Normal sensation, bilateral equal grip strengths, bilateral plantar and dorsiflexion intact with 2+ reflexes in the upper and lower extremities.  Skin:    General: Skin is warm and dry.  Neurological:     Mental Status: She is alert and oriented to person, place, and time.  Psychiatric:        Behavior: Behavior normal.      ED Treatments / Results  Labs (all labs ordered are listed, but only abnormal results are displayed) Labs  Reviewed - No data to display  EKG None  Radiology Dg Lumbar Spine Complete  Result Date: 09/12/2019 CLINICAL DATA:  Low back pain with sciatica. EXAM: LUMBAR SPINE - COMPLETE 4+ VIEW COMPARISON:  July 08, 2015. FINDINGS: No fracture or spondylolisthesis is noted. Disc spaces are well-maintained. Minimal anterior osteophyte formation is noted at L2-3 and L3-4. Posterior facet joints are unremarkable. IMPRESSION: Minimal degenerative changes as described above. No acute abnormality seen in the lumbar spine. Electronically Signed   By: Marijo Conception M.D.   On: 09/12/2019 12:48    Procedures Procedures (including critical care time)  Medications Ordered in ED Medications  oxyCODONE-acetaminophen (PERCOCET/ROXICET) 5-325 MG per tablet 1 tablet (has no administration in time range)     Initial Impression / Assessment and Plan / ED Course  I have reviewed the triage vital signs and the nursing notes.  Pertinent labs & imaging results that were available during my care of the patient were reviewed by me and considered in my medical decision making (see chart for details).       Patient with back pain.  No neurological deficits and normal neuro exam.  Patient can walk but states is painful.  No loss of bowel or bladder control.  No concern for cauda equina.  No fever, night sweats, weight loss, h/o cancer, IVDU.  RICE protocol and pain medicine indicated and discussed with patient.    Final Clinical Impressions(s) / ED Diagnoses   Final diagnoses:  None    ED Discharge Orders    None       Margarita Mail, PA-C 09/12/19 1603    Isla Pence, MD 09/13/19 [phone]

## 2019-09-17 ENCOUNTER — Ambulatory Visit: Payer: Medicaid Other | Admitting: Physical Therapy

## 2019-09-17 ENCOUNTER — Telehealth: Payer: Self-pay | Admitting: Physical Therapy

## 2019-09-17 NOTE — Telephone Encounter (Signed)
Left voicemail regarding no show and left next appointment time. Asked her to call if she needs to reschedule or cancel future appointments.

## 2019-09-19 ENCOUNTER — Ambulatory Visit: Payer: Medicaid Other | Admitting: Physical Therapy

## 2019-09-24 ENCOUNTER — Ambulatory Visit: Payer: Medicaid Other | Admitting: Physical Therapy

## 2019-10-11 ENCOUNTER — Other Ambulatory Visit: Payer: Self-pay

## 2019-10-11 DIAGNOSIS — Z20822 Contact with and (suspected) exposure to covid-19: Secondary | ICD-10-CM

## 2019-10-11 DIAGNOSIS — Z20828 Contact with and (suspected) exposure to other viral communicable diseases: Secondary | ICD-10-CM | POA: Diagnosis not present

## 2019-10-13 LAB — NOVEL CORONAVIRUS, NAA: SARS-CoV-2, NAA: DETECTED — AB

## 2019-10-15 DIAGNOSIS — F4310 Post-traumatic stress disorder, unspecified: Secondary | ICD-10-CM | POA: Diagnosis not present

## 2019-10-16 ENCOUNTER — Telehealth: Payer: Self-pay

## 2019-10-16 NOTE — Telephone Encounter (Signed)
Pt called requesting BP medication refill. Has not been seen since June. Tested positive for COVID 10/13/19. Reports blood pressures have been running high. Scheduled virtual appointment, instructed patient to document BP daily and if experiencing any symptoms.  Talbot Grumbling, RN

## 2019-10-17 ENCOUNTER — Other Ambulatory Visit: Payer: Self-pay

## 2019-10-17 ENCOUNTER — Telehealth (INDEPENDENT_AMBULATORY_CARE_PROVIDER_SITE_OTHER): Payer: Medicaid Other | Admitting: Family Medicine

## 2019-10-17 ENCOUNTER — Telehealth: Payer: Self-pay | Admitting: Family Medicine

## 2019-10-17 DIAGNOSIS — G8929 Other chronic pain: Secondary | ICD-10-CM | POA: Diagnosis not present

## 2019-10-17 DIAGNOSIS — R519 Headache, unspecified: Secondary | ICD-10-CM | POA: Diagnosis not present

## 2019-10-17 DIAGNOSIS — I10 Essential (primary) hypertension: Secondary | ICD-10-CM | POA: Diagnosis not present

## 2019-10-17 MED ORDER — PROPRANOLOL HCL 40 MG PO TABS: 40.0000 mg | ORAL_TABLET | Freq: Every day | ORAL | 0 refills | Status: DC

## 2019-10-17 NOTE — Telephone Encounter (Signed)
Precepting this patient, we noted that patient is listed as taking estradiol and is also smoking and over 38.  Called the patient to confirm that she was still taking this medicine which she says she is taking for menopause symptoms.  We discussed that there are potential risks to taking this medication given her age and smoking status, advised her to discuss risk versus benefits and potential alternate medication with her prescriber, Dr. Philis Pique.  Patient said that she will call and schedule a telemedicine visit to discuss.  Dr. Criss Rosales

## 2019-10-17 NOTE — Progress Notes (Addendum)
Virtual Visit via Telephone Note  I connected with [name] on 10/17/19 at  8:50 AM EST by telephone and verified that I am speaking with the correct person using two identifiers.  Location: Patient: At home on phone, does not have video capability Provider: [facility] clinic   I discussed the limitations, risks, security and privacy concerns of performing an evaluation and management service by telephone and the availability of in person appointments. I also discussed with the patient that there may be a patient responsible charge related to this service. The patient expressed understanding and agreed to proceed.   History of Present Illness: Patient has been on propranolol long-term for blood pressure and migraine control.  Says that she has been out of her propanolol and that the pharmacy will not refill it because they were told that she needs an appointment with her physician first.  This does make sense and she tried to schedule but she has recently been diagnosed as Covid positive and will be unable to schedule an appointment until least the first of the year.     Observations/Objective:  She is coughing but is speaking in full sentences, no obvious distress.  Assessment and Plan: Given this limitation and our inability to see her in clinic, I am refilling her propanolol for 45 days which will give her time to schedule an appointment with Dr. Garlan Fillers in the first or second week of January.  We discussed that this was not going to be a long-term solution but will buy her some time given her Covid diagnosis.   Follow Up Instructions: Schedule follow-up with Dr. Garlan Fillers in the first or second week of January for next propanolol refill   I discussed the assessment and treatment plan with the patient. The patient was provided an opportunity to ask questions and all were answered. The patient agreed with the plan and demonstrated an understanding of the instructions.   The patient was  advised to call back or seek an in-person evaluation if the symptoms worsen or if the condition fails to improve as anticipated.  I provided 10 minutes of non-face-to-face time during this encounter.   Sherene Sires, DO

## 2019-11-06 DIAGNOSIS — M5126 Other intervertebral disc displacement, lumbar region: Secondary | ICD-10-CM | POA: Diagnosis not present

## 2019-11-06 DIAGNOSIS — M545 Low back pain: Secondary | ICD-10-CM | POA: Diagnosis not present

## 2019-11-08 DIAGNOSIS — M5126 Other intervertebral disc displacement, lumbar region: Secondary | ICD-10-CM | POA: Diagnosis not present

## 2019-11-15 DIAGNOSIS — I10 Essential (primary) hypertension: Secondary | ICD-10-CM | POA: Diagnosis not present

## 2019-11-15 DIAGNOSIS — Z6825 Body mass index (BMI) 25.0-25.9, adult: Secondary | ICD-10-CM | POA: Diagnosis not present

## 2019-11-15 DIAGNOSIS — M5416 Radiculopathy, lumbar region: Secondary | ICD-10-CM | POA: Insufficient documentation

## 2019-11-15 DIAGNOSIS — M5126 Other intervertebral disc displacement, lumbar region: Secondary | ICD-10-CM | POA: Diagnosis not present

## 2019-11-23 ENCOUNTER — Other Ambulatory Visit: Payer: Self-pay

## 2019-11-23 ENCOUNTER — Encounter: Payer: Self-pay | Admitting: Physical Therapy

## 2019-11-23 ENCOUNTER — Ambulatory Visit: Payer: Medicaid Other | Attending: Physician Assistant | Admitting: Physical Therapy

## 2019-11-23 DIAGNOSIS — M545 Low back pain, unspecified: Secondary | ICD-10-CM

## 2019-11-23 DIAGNOSIS — M5126 Other intervertebral disc displacement, lumbar region: Secondary | ICD-10-CM | POA: Diagnosis not present

## 2019-11-23 NOTE — Therapy (Signed)
[place], [place], [address] Phone: [phone]   Fax:  [phone]  Physical Therapy Evaluation  Patient Details  Name: [name] MRN: [id number] Date of Birth: Oct 12, 1981 Referring Provider (PT): Kary Kos, MD   Encounter Date: 11/23/2019  PT End of Session - 11/23/19 0934    Visit Number  1    Authorization Type  MCD- auth submitted 11/23/19    PT Start Time  0926    PT Stop Time  1000    PT Time Calculation (min)  34 min    Activity Tolerance  Patient limited by pain    Behavior During Therapy  [facility] for tasks assessed/performed       Past Medical History:  Diagnosis Date  . Abdominal lipoma 05/2015  . Abdominal pain, RLQ 10/03/2017  . Acute medial meniscus tear of right knee 06/09/2019  . Anxiety   . Arthritis    lower back  . DM type 2 with diabetic peripheral neuropathy (HCC)    Borderline  . GERD (gastroesophageal reflux disease)   . Headache    Migraines  . History of MRSA infection ~ 2006   leg  . History of shingles   . Hx of hydronephrosis    following total hysterectomy surgery ; Right kidney ; see CT abdomen 10-02-17 epic; states " a plug had got damaged during the surgery so thats why they put the stent in"   . Hypertension    under control with med., has been on med. x 6 mos.  . Lumbosacral spondylolysis    see MRI 06-01-17 epic   . Mood swing   . Optic nerve swelling    seen by Dr Mora Bellman 09-07-17, did lumbar puncture 09-19-17; patient states " they never called me about results ut i have an appt with him in april"; patient also states  " my vision is still blurry and worse when i get the migraines, they think the migraines are causing it"  . Psoriasis     Past Surgical History:  Procedure Laterality Date  . CESAREAN SECTION  06/21/2008; 08/18/2010  . CESAREAN SECTION N/A 07/10/2014   Procedure: REPEAT CESAREAN SECTION;  Surgeon: Daria Pastures, MD;  Location: [facility];   Service: Obstetrics;  Laterality: N/A;  . CHOLECYSTECTOMY  07/10/2008  . CYSTOSCOPY N/A 09/28/2017   Procedure: CYSTOSCOPY;  Surgeon: Bobbye Charleston, MD;  Location: [facility];  Service: Gynecology;  Laterality: N/A;  . CYSTOSCOPY Right 01/18/2018   Procedure: CYSTOSCOPY, RETROGRADE / PYELOGRAM, URETEROSCOPY;  Surgeon: Ceasar Mons, MD;  Location[facility];  Service: Urology;  Laterality: Right;  . CYSTOSCOPY W/ URETERAL STENT PLACEMENT Right 10/03/2017   Procedure: CYSTOSCOPY WITH RETROGRADE PYELOGRAM/URETERAL STENT PLACEMENT;  Surgeon: Ceasar Mons, MD;  Location: [facility];  Service: Urology;  Laterality: Right;  . CYSTOSCOPY W/ URETERAL STENT PLACEMENT Right 02/22/2018   Procedure: CYSTOSCOPY WITH URETERAL STENT PLACEMENT;  Surgeon: Ceasar Mons, MD;  Location[facility];  Service: Urology;  Laterality: Right;  . KNEE ARTHROSCOPY WITH MEDIAL MENISECTOMY Right 06/26/2019   Procedure: KNEE ARTHROSCOPY WITH PARTIAL MEDIAL MENISECTOMY, REMOVAL LOOSE BODY;  Surgeon: Elsie Saas, MD;  Location: [place];  Service: Orthopedics;  Laterality: Right;  . LIPOMA EXCISION  2014   back  . LIPOMA EXCISION N/A 05/21/2015   Procedure: EXCISION LIPOMAS ON ABDOMEN;  Surgeon: Erroll Luna, MD;  Location: [place];  Service: General;  Laterality: N/A;  .  LUMBAR PUNCTURE  09/19/17   [facility], Dr. Curt Bears  . OOPHORECTOMY Left 09/28/2017   Procedure: OOPHORECTOMY;  Surgeon: Bobbye Charleston, MD;  Location: [facility];  Service: Gynecology;  Laterality: Left;  . ROBOTIC ASSISTED LAPAROSCOPIC LYSIS OF ADHESION N/A 09/28/2017   Procedure: ROBOTIC ASSISTED LAPAROSCOPIC LYSIS OF ADHESION;  Surgeon: Bobbye Charleston, MD;  Location: [facility];  Service: Gynecology;  Laterality: N/A;  . ROBOTIC ASSISTED TOTAL HYSTERECTOMY WITH BILATERAL SALPINGO OOPHERECTOMY Bilateral 09/28/2017   Procedure: ROBOTIC ASSISTED TOTAL HYSTERECTOMY  WITH BILATERAL SALPINGECTOMY;  Surgeon: Bobbye Charleston, MD;  Location: [facility];  Service: Gynecology;  Laterality: Bilateral;  . ROBOTICALLY ASSISTED LAPAROSCOPIC URETERAL RE-IMPLANTATION Right 02/22/2018   Procedure: ROBOTICALLY ASSISTED LAPAROSCOPIC URETERAL RE-IMPLANTATION;  Surgeon: Ceasar Mons, MD;  [facility];  Service: Urology;  Laterality: Right;  ONLY NEEDS 240 MIN TOTAL FOR ALL PROCEDURES  . TUBAL LIGATION    . WISDOM TOOTH EXTRACTION      There were no vitals filed for this visit.   Subjective Assessment - 11/23/19 0929    Subjective  Initially I slipped the disk in 2006-2007 so it has been an issue but never got that severe until about a month ago. Went to ER with spasm. It feels like somebody is hitting it with a sledge hammer. Starts grabbing at midline lumbar spine and then moves across bil low back. Mostly just Rt leg- everything gets stiff and feels locked. Denies shooting pain into legs. Legs are more aching/throbbing. Start injections next wednesday.    How long can you sit comfortably?  unable    Patient Stated Goals  decrease pain    Currently in Pain?  Yes    Pain Score  7     Pain Location  Back    Pain Orientation  Mid;Right;Left    Pain Descriptors / Indicators  Pounding    Pain Onset  1 to 4 weeks ago    Pain Frequency  Constant    Aggravating Factors   none known    Pain Relieving Factors  none known         [facility] PT Assessment - 11/23/19 0001      Assessment   Medical Diagnosis  Herniated lumbar disc without myelopathy    Referring Provider (PT)  Kary Kos, MD    Onset Date/Surgical Date  --   acute on chronic, 09/09/19   Hand Dominance  Right    Prior Therapy  for knee      Precautions   Precautions  None      Restrictions   Weight Bearing Restrictions  No      Balance Screen   Has the patient fallen in the past 6 months  No      Home Environment   Additional Comments  one step into house      Prior Function   Vocation  Requirements  not working      Cognition   Overall Cognitive Status  Within Functional Limits for tasks assessed      Sensation   Additional Comments  N/T in Rt LE when it gets stiff      AROM   Overall AROM Comments  lumbar AROM limited to approx 25% in all directions with pain      Strength   Overall Strength Comments  not appropriate to test at eval      Special Tests   Other special tests  +SLR on Lt at approx 35 deg; distal symptoms with prone  extension      Transfers   Comments  slow stand from chair with use of bil UE                Objective measurements completed on examination: See above findings.      [facility] Adult PT Treatment/Exercise - 11/23/19 0001      Exercises   Exercises  Lumbar      Lumbar Exercises: Stretches   Single Knee to Chest Stretch Limitations  cues for deep breathing    Lower Trunk Rotation Limitations  small ROM    Piriformis Stretch Limitations  cues to relax and breathe      Lumbar Exercises: Supine   AB Set Limitations  coordinated with breathing             PT Education - 11/23/19 1016    Education Details  anatomy of condition, POC, HEP, exercise form/rationale    Person(s) Educated  Patient    Methods  Explanation;Demonstration;Tactile cues;Verbal cues;Handout    Comprehension  Returned demonstration;Verbalized understanding;Verbal cues required;Tactile cues required;Need further instruction       PT Short Term Goals - 11/23/19 1012      PT SHORT TERM GOAL #1   Title  pt will demonstrate proper abdominal engagement in supine, seated and standing positions for support to spine    Baseline  began training supine at eval    Time  3    Period  Weeks    Status  New    Target Date  12/14/19      PT SHORT TERM GOAL #2   Title  pt will be independent with HEP as it has been established in the short term    Baseline  began at eval    Time  3    Period  Weeks    Status  New    Target Date  12/14/19      PT  SHORT TERM GOAL #3   Title  Pt will perform sit to stand with minimal pain in lumbar spine    Baseline  severe, electric shock at eval that does not travel into buttock    Time  3    Period  Weeks    Status  New    Target Date  12/14/19        PT Long Term Goals - 11/23/19 1014      PT LONG TERM GOAL #1   Title  To be set at [facility]             Plan - 11/23/19 0958    Clinical Impression Statement  Pt presents to PT with complaints of LBP.LBP is chronic but severity increased significantly about a month ago. MRI reveals spondylosis at L4-5, L5-S1, annular tear at L4-5 and large central disc herniation L5-S1. Is beginning epidural steroid injections next week. +SLR on Lt with empty end feel at approx 35 deg. Not appropraite to perform MMT due to pain levels but is able to move all extremities against gravity without assistance (3/5). Significant muscle spasm in lumbar paraspinals, QLs and into gluts. Reports concordant pain upon light palpation and Gr 1 mobilization at bil SIJ.    Personal Factors and Comorbidities  Time since onset of injury/illness/exacerbation;Comorbidity 1    Comorbidities  chronic pain, anxiety, GERD, HTN, abdominal lipoma    Examination-Activity Limitations  Bathing;Bed Mobility;Bend;Sit;Sleep;Carry;Squat;Stairs;Dressing;Stand;Lift;Hygiene/Grooming    Examination-Participation Restrictions  Cleaning;Other    Stability/Clinical Decision Making  Evolving/Moderate complexity    Clinical  Decision Making  Moderate    Rehab Potential  Good    PT Frequency  --   3 visits in first auth   PT Treatment/Interventions  ADLs/Self Care Home Management;Cryotherapy;Electrical Stimulation;Iontophoresis 4mg /ml Dexamethasone;Gait training;Functional mobility training;Traction;Ultrasound;Moist Heat;Therapeutic activities;Therapeutic exercise;Neuromuscular re-education;Patient/family education;Passive range of motion;Manual techniques;Dry needling;Taping;Spinal Manipulations;Joint  Manipulations    PT Next Visit Plan  DN to lumbar paraspinals, nerve glides    PT Home Exercise Plan  SKTC, LTR, piriformis stretch, abdominal engagement/post pelvic tilt    Consulted and Agree with Plan of Care  Patient       Patient will benefit from skilled therapeutic intervention in order to improve the following deficits and impairments:  Decreased range of motion, Difficulty walking, Increased muscle spasms, Decreased activity tolerance, Pain, Improper body mechanics, Impaired flexibility, Decreased strength, Postural dysfunction  Visit Diagnosis: Displacement of lumbar intervertebral disc without myelopathy - Plan: PT plan of care cert/re-cert  Midline low back pain without sciatica, unspecified chronicity - Plan: PT plan of care cert/re-cert     Problem List Patient Active Problem List   Diagnosis Date Noted  . Acute medial meniscus tear of right knee 06/09/2019  . DM type 2 with diabetic peripheral neuropathy ([place])   . Acute pain of right knee 04/20/2019  . 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 Hyperlipidemia 01/20/2018  . Tobacco use disorder 01/19/2018  . Anxiety state 01/17/2018  . Abdominal pain, RLQ 10/03/2017  . Postoperative state 09/28/2017  . Diabetic neuropathy ([place]) 06/21/2017  . Anemia 06/20/2017  . Diabetes type 2, controlled ([place]) 05/16/2017  . H/O: C-section 05/16/2017  . Vitamin B12 deficiency 05/16/2017  . Health care maintenance 10/18/2016  . Psoriasis 10/01/2013  . Hypertension 09/26/2012  . Obesity 09/26/2012  . Subcutaneous nodule 09/26/2012  . History of abnormal Pap smear 09/26/2012   Dshaun Reppucci C. Ronika Kelson PT, DPT 11/23/19 10:22 AM   [facility] [address] [place], [place], [address] Phone: [phone]   Fax:  [phone]  Name: Taylen Joiner MRN: [id number] Date of Birth: 25-Feb-1981

## 2019-11-28 DIAGNOSIS — M5416 Radiculopathy, lumbar region: Secondary | ICD-10-CM | POA: Diagnosis not present

## 2019-12-06 ENCOUNTER — Ambulatory Visit: Payer: Medicaid Other | Attending: Physician Assistant | Admitting: Physical Therapy

## 2019-12-06 ENCOUNTER — Telehealth: Payer: Self-pay | Admitting: Physical Therapy

## 2019-12-06 DIAGNOSIS — M545 Low back pain: Secondary | ICD-10-CM | POA: Insufficient documentation

## 2019-12-06 DIAGNOSIS — M5126 Other intervertebral disc displacement, lumbar region: Secondary | ICD-10-CM | POA: Insufficient documentation

## 2019-12-06 NOTE — Telephone Encounter (Signed)
Called and spoke with patient regarding no show for therapy appointment today. She reports thought only appointment this week was for tomorrow 12/07/19 (visits this week were on schedule for both 12/06/19 and 12/07/19). Confirmed appointment time for tomorrow's visit.

## 2019-12-07 ENCOUNTER — Ambulatory Visit: Payer: Medicaid Other | Admitting: Physical Therapy

## 2019-12-07 ENCOUNTER — Encounter: Payer: Self-pay | Admitting: Physical Therapy

## 2019-12-07 ENCOUNTER — Other Ambulatory Visit: Payer: Self-pay

## 2019-12-07 DIAGNOSIS — M5126 Other intervertebral disc displacement, lumbar region: Secondary | ICD-10-CM | POA: Diagnosis not present

## 2019-12-07 DIAGNOSIS — M545 Low back pain, unspecified: Secondary | ICD-10-CM

## 2019-12-07 NOTE — Therapy (Addendum)
[place], [place], [address] Phone: [phone]   Fax:  [phone]  Physical Therapy Treatment/Discharge  Patient Details  Name: [name] MRN: [id number] Date of Birth: 04/11/1981 Referring Provider (PT): Kary Kos, MD   Encounter Date: 12/07/2019  PT End of Session - 12/07/19 1019    Visit Number  2    Authorization Time Period  2/3-2/16    Authorization - Visit Number  1    Authorization - Number of Visits  3    PT Start Time  1638    PT Stop Time  1058    PT Time Calculation (min)  43 min    Activity Tolerance  Patient tolerated treatment well    Behavior During Therapy  [facility] for tasks assessed/performed       Past Medical History:  Diagnosis Date  . Abdominal lipoma 05/2015  . Abdominal pain, RLQ 10/03/2017  . Acute medial meniscus tear of right knee 06/09/2019  . Anxiety   . Arthritis    lower back  . DM type 2 with diabetic peripheral neuropathy (HCC)    Borderline  . GERD (gastroesophageal reflux disease)   . Headache    Migraines  . History of MRSA infection ~ 2006   leg  . History of shingles   . Hx of hydronephrosis    following total hysterectomy surgery ; Right kidney ; see CT abdomen 10-02-17 epic; states " a plug had got damaged during the surgery so thats why they put the stent in"   . Hypertension    under control with med., has been on med. x 6 mos.  . Lumbosacral spondylolysis    see MRI 06-01-17 epic   . Mood swing   . Optic nerve swelling    seen by Dr Mora Bellman 09-07-17, did lumbar puncture 09-19-17; patient states " they never called me about results ut i have an appt with him in april"; patient also states  " my vision is still blurry and worse when i get the migraines, they think the migraines are causing it"  . Psoriasis     Past Surgical History:  Procedure Laterality Date  . CESAREAN SECTION  06/21/2008; 08/18/2010  . CESAREAN SECTION N/A 07/10/2014    Procedure: REPEAT CESAREAN SECTION;  Surgeon: Daria Pastures, MD;  Location: [facility];  Service: Obstetrics;  Laterality: N/A;  . CHOLECYSTECTOMY  07/10/2008  . CYSTOSCOPY N/A 09/28/2017   Procedure: CYSTOSCOPY;  Surgeon: Bobbye Charleston, MD;  Location: [facility];  Service: Gynecology;  Laterality: N/A;  . CYSTOSCOPY Right 01/18/2018   Procedure: CYSTOSCOPY, RETROGRADE / PYELOGRAM, URETEROSCOPY;  Surgeon: Ceasar Mons, MD;  Location[facility];  Service: Urology;  Laterality: Right;  . CYSTOSCOPY W/ URETERAL STENT PLACEMENT Right 10/03/2017   Procedure: CYSTOSCOPY WITH RETROGRADE PYELOGRAM/URETERAL STENT PLACEMENT;  Surgeon: Ceasar Mons, MD;  Location: [facility];  Service: Urology;  Laterality: Right;  . CYSTOSCOPY W/ URETERAL STENT PLACEMENT Right 02/22/2018   Procedure: CYSTOSCOPY WITH URETERAL STENT PLACEMENT;  Surgeon: Ceasar Mons, MD;  [facility];  Service: Urology;  Laterality: Right;  . KNEE ARTHROSCOPY WITH MEDIAL MENISECTOMY Right 06/26/2019   Procedure: KNEE ARTHROSCOPY WITH PARTIAL MEDIAL MENISECTOMY, REMOVAL LOOSE BODY;  Surgeon: Elsie Saas, MD;  Location: [place];  Service: Orthopedics;  Laterality: Right;  . LIPOMA EXCISION  2014   back  . LIPOMA EXCISION N/A 05/21/2015   Procedure: EXCISION LIPOMAS ON ABDOMEN;  Surgeon:  Erroll Luna, MD;  Location: [facility];  Service: General;  Laterality: N/A;  . LUMBAR PUNCTURE  09/19/17   [facility], Dr. Curt Bears  . OOPHORECTOMY Left 09/28/2017   Procedure: OOPHORECTOMY;  Surgeon: Bobbye Charleston, MD;  Location: [facility];  Service: Gynecology;  Laterality: Left;  . ROBOTIC ASSISTED LAPAROSCOPIC LYSIS OF ADHESION N/A 09/28/2017   Procedure: ROBOTIC ASSISTED LAPAROSCOPIC LYSIS OF ADHESION;  Surgeon: Bobbye Charleston, MD;  Location: [facility];  Service: Gynecology;  Laterality: N/A;  . ROBOTIC ASSISTED TOTAL HYSTERECTOMY WITH BILATERAL  SALPINGO OOPHERECTOMY Bilateral 09/28/2017   Procedure: ROBOTIC ASSISTED TOTAL HYSTERECTOMY WITH BILATERAL SALPINGECTOMY;  Surgeon: Bobbye Charleston, MD;  Location: [facility];  Service: Gynecology;  Laterality: Bilateral;  . ROBOTICALLY ASSISTED LAPAROSCOPIC URETERAL RE-IMPLANTATION Right 02/22/2018   Procedure: ROBOTICALLY ASSISTED LAPAROSCOPIC URETERAL RE-IMPLANTATION;  Surgeon: Ceasar Mons, MD;  [facility];  Service: Urology;  Laterality: Right;  ONLY NEEDS 240 MIN TOTAL FOR ALL PROCEDURES  . TUBAL LIGATION    . WISDOM TOOTH EXTRACTION      There were no vitals filed for this visit.  Subjective Assessment - 12/07/19 1017    Subjective  Got the injection last Wednesday, it was good the day after but then my left leg locked up in a sitting position. They are going to do another injection on the 24th using a different technique.    Patient Stated Goals  decrease pain    Currently in Pain?  Yes    Pain Score  7     Pain Location  Back    Aggravating Factors   constant pain    Pain Relieving Factors  not much                       [facility] Adult PT Treatment/Exercise - 12/07/19 0001      Modalities   Modalities  Electrical Stimulation;Moist Heat      Moist Heat Therapy   Number Minutes Moist Heat  10 Minutes   with ESTIM   Moist Heat Location  Lumbar Spine      Electrical Stimulation   Electrical Stimulation Location  lumbar spine    Electrical Stimulation Action  IFC    Electrical Stimulation Parameters  10 min to tol. with heat    Electrical Stimulation Goals  Pain      Manual Therapy   Manual Therapy  Soft tissue mobilization    Manual therapy comments  skilled palpation and monitoring during TPDN    Soft tissue mobilization  IASTM Lt lumbar paraspinals & glut max upper fibers       Trigger Point Dry Needling - 12/07/19 0001    Consent Given?  Yes    Education Handout Provided  --   verbal education   Muscles Treated Back/Hip  Gluteus  maximus    Other Dry Needling  lumbar paraspinals Lt L2-5    Gluteus Maximus Response  Twitch response elicited;Palpable increased muscle length   Left          PT Education - 12/07/19 1101    Education Details  TPDN  & expected outcomes    Person(s) Educated  Patient    Methods  Explanation    Comprehension  Verbalized understanding;Need further instruction       PT Short Term Goals - 11/23/19 1012      PT SHORT TERM GOAL #1   Title  pt will demonstrate proper abdominal engagement in supine, seated and standing positions  for support to spine    Baseline  began training supine at eval    Time  3    Period  Weeks    Status  New    Target Date  12/14/19      PT SHORT TERM GOAL #2   Title  pt will be independent with HEP as it has been established in the short term    Baseline  began at eval    Time  3    Period  Weeks    Status  New    Target Date  12/14/19      PT SHORT TERM GOAL #3   Title  Pt will perform sit to stand with minimal pain in lumbar spine    Baseline  severe, electric shock at eval that does not travel into buttock    Time  3    Period  Weeks    Status  New    Target Date  12/14/19        PT Long Term Goals - 11/23/19 1014      PT LONG TERM GOAL #1   Title  To be set at [facility] PRN            Plan - 12/07/19 1048    Clinical Impression Statement  DN utilized to decrease excessive tension in paraspinals. applied estim with heat following as I expect she will be very sore due to tightness. Encouraged her to move frequently and drink water. Use heat PRN.    PT Treatment/Interventions  ADLs/Self Care Home Management;Cryotherapy;Electrical Stimulation;Iontophoresis '4mg'$ /ml Dexamethasone;Gait training;Functional mobility training;Traction;Ultrasound;Moist Heat;Therapeutic activities;Therapeutic exercise;Neuromuscular re-education;Patient/family education;Passive range of motion;Manual techniques;Dry needling;Taping;Spinal Manipulations;Joint  Manipulations    PT Next Visit Plan  outcome of DN? continue PRN, abdominal engagement    PT Home Exercise Plan  SKTC, LTR, piriformis stretch, abdominal engagement/post pelvic tilt    Consulted and Agree with Plan of Care  Patient       Patient will benefit from skilled therapeutic intervention in order to improve the following deficits and impairments:  Decreased range of motion, Difficulty walking, Increased muscle spasms, Decreased activity tolerance, Pain, Improper body mechanics, Impaired flexibility, Decreased strength, Postural dysfunction  Visit Diagnosis: Displacement of lumbar intervertebral disc without myelopathy  Midline low back pain without sciatica, unspecified chronicity     Problem List Patient Active Problem List   Diagnosis Date Noted  . Acute medial meniscus tear of right knee 06/09/2019  . DM type 2 with diabetic peripheral neuropathy ([place])   . Acute pain of right knee 04/20/2019  . 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 Hyperlipidemia 01/20/2018  . Tobacco use disorder 01/19/2018  . Anxiety state 01/17/2018  . Abdominal pain, RLQ 10/03/2017  . Postoperative state 09/28/2017  . Diabetic neuropathy ([place]) 06/21/2017  . Anemia 06/20/2017  . Diabetes type 2, controlled ([place]) 05/16/2017  . H/O: C-section 05/16/2017  . Vitamin B12 deficiency 05/16/2017  . Health care maintenance 10/18/2016  . Psoriasis 10/01/2013  . Hypertension 09/26/2012  . Obesity 09/26/2012  . Subcutaneous nodule 09/26/2012  . History of abnormal Pap smear 09/26/2012   Betsaida [place] C. Taiana Temkin PT, DPT 12/07/19 11:02 AM   [place], [place], [address] Phone: [phone]   Fax:  [phone]  Name: [name] MRN: [id number] Date of Birth: [birth year]  PHYSICAL THERAPY DISCHARGE SUMMARY  Visits from Start of Care: 2  Current functional level related to goals / functional outcomes: See above  Remaining deficits: See above   Education / Equipment: Anatomy of condition, POC, hep, exercise form/rationale  Plan: Patient agrees to discharge.  Patient goals were not met. Patient is being discharged due to                                                     ?????  Scheduled for surgical intervention.    Rourke Mcquitty C. Erice Ahles PT, DPT 01/03/20 12:55 PM

## 2019-12-10 ENCOUNTER — Telehealth: Payer: Self-pay | Admitting: Physical Therapy

## 2019-12-10 ENCOUNTER — Ambulatory Visit: Payer: Medicaid Other | Admitting: Physical Therapy

## 2019-12-10 DIAGNOSIS — F4310 Post-traumatic stress disorder, unspecified: Secondary | ICD-10-CM | POA: Diagnosis not present

## 2019-12-10 NOTE — Telephone Encounter (Signed)
Attempted to contact patient regarding no show for therapy appointment at 11:45 AM today. Left voicemail including reminder of next appointment time as well as facility attendance policy given 2nd no show.

## 2019-12-18 ENCOUNTER — Ambulatory Visit: Payer: Medicaid Other | Admitting: Physical Therapy

## 2019-12-18 DIAGNOSIS — F4310 Post-traumatic stress disorder, unspecified: Secondary | ICD-10-CM | POA: Diagnosis not present

## 2019-12-24 ENCOUNTER — Other Ambulatory Visit: Payer: Self-pay

## 2019-12-24 ENCOUNTER — Ambulatory Visit (INDEPENDENT_AMBULATORY_CARE_PROVIDER_SITE_OTHER): Payer: Medicaid Other | Admitting: Family Medicine

## 2019-12-24 ENCOUNTER — Encounter: Payer: Self-pay | Admitting: Family Medicine

## 2019-12-24 VITALS — BP 154/118 | HR 97 | Wt 209.2 lb

## 2019-12-24 DIAGNOSIS — Z23 Encounter for immunization: Secondary | ICD-10-CM | POA: Diagnosis not present

## 2019-12-24 DIAGNOSIS — I10 Essential (primary) hypertension: Secondary | ICD-10-CM | POA: Diagnosis not present

## 2019-12-24 DIAGNOSIS — E785 Hyperlipidemia, unspecified: Secondary | ICD-10-CM

## 2019-12-24 DIAGNOSIS — E119 Type 2 diabetes mellitus without complications: Secondary | ICD-10-CM

## 2019-12-24 LAB — POCT GLYCOSYLATED HEMOGLOBIN (HGB A1C): HbA1c, POC (controlled diabetic range): 7 % (ref 0.0–7.0)

## 2019-12-24 MED ORDER — LOSARTAN POTASSIUM 25 MG PO TABS: 25.0000 mg | ORAL_TABLET | Freq: Every day | ORAL | 3 refills | Status: DC

## 2019-12-24 MED ORDER — EMPAGLIFLOZIN 10 MG PO TABS: 10.0000 mg | ORAL_TABLET | Freq: Every day | ORAL | 3 refills | Status: DC

## 2019-12-24 NOTE — Patient Instructions (Addendum)
It was great to see you today! Thank you for letting me participate in your care!  Today, we discussed high blood pressure and increased A1c. You diabetes now meets criteria for medical management. Since you had a negative reaction to Metformin I have started you on a different medication. Please take it as prescribed. I have also started you on a blood pressure medication. Please take it as prescribed. I will see you back in one month to see how you are doing on these new medications.  Be well, Harolyn Rutherford, DO PGY-3, [facility]

## 2019-12-25 ENCOUNTER — Telehealth: Payer: Self-pay | Admitting: *Deleted

## 2019-12-25 DIAGNOSIS — M5126 Other intervertebral disc displacement, lumbar region: Secondary | ICD-10-CM | POA: Diagnosis not present

## 2019-12-25 LAB — BASIC METABOLIC PANEL
BUN/Creatinine Ratio: 13 (ref 9–23)
BUN: 8 mg/dL (ref 6–24)
CO2: 20 mmol/L (ref 20–29)
Calcium: 9.9 mg/dL (ref 8.7–10.2)
Chloride: 100 mmol/L (ref 96–106)
Creatinine, Ser: 0.62 mg/dL (ref 0.57–1.00)
GFR calc Af Amer: 130 mL/min/{1.73_m2} (ref >59)
GFR calc non Af Amer: 112 mL/min/{1.73_m2} (ref >59)
Glucose: 93 mg/dL (ref 65–99)
Potassium: 4.3 mmol/L (ref 3.5–5.2)
Sodium: 138 mmol/L (ref 134–144)

## 2019-12-25 LAB — LIPID PANEL
Chol/HDL Ratio: 6.6 ratio — ABNORMAL HIGH (ref 0.0–4.4)
Cholesterol, Total: 250 mg/dL — ABNORMAL HIGH (ref 100–199)
HDL: 38 mg/dL — ABNORMAL LOW (ref >39)
LDL Chol Calc (NIH): 143 mg/dL — ABNORMAL HIGH (ref 0–99)
Triglycerides: 374 mg/dL — ABNORMAL HIGH (ref 0–149)
VLDL Cholesterol Cal: 69 mg/dL — ABNORMAL HIGH (ref 5–40)

## 2019-12-25 MED ORDER — ATORVASTATIN CALCIUM 20 MG PO TABS: 20.0000 mg | ORAL_TABLET | Freq: Every day | ORAL | 3 refills | Status: DC

## 2019-12-25 NOTE — Progress Notes (Signed)
    SUBJECTIVE:   CHIEF COMPLAINT / HPI: Annual Follow-Up  HTN BP today at clinic not well controlled at 154/118. Recheck was 148/105. No headaches, vision changes, confusion, or dizziness. She states she checks it at home sometimes and it is usually elevated at that time as well. She has not been on any BP medications. She does feel her BP is higher than usual today due to having back pain for which she has an appointment   DM Poorly controlled now at 7.0% A1c. Previously, her A1c was 6.8% and we had agreed to attempt diet control. Her diet has been "so so". She has tried Metformin in the past but could not tolerate this medication due to nausea, vomiting, and diarrhea. She would like to try an oral medication before thinking about insulin. I think this is reasonable given she is close to being at goal. No excessive thirst or urination.   HLD Patient has history of hyperlipidemia and has not been on a statin. Given her diabetes and ASCVD risk score she should be on a statin.  PERTINENT  PMH / PSH: HTN, DM, HLD  OBJECTIVE:   BP (!) 154/118   Pulse 97   Wt 209 lb 3.2 oz (94.9 kg)   LMP 09/11/2017 (Approximate)   SpO2 99%   BMI 32.77 kg/m   Gen: Alert and Oriented x 3, NAD CV: RRR, no murmurs, normal S1, S2 split Resp: CTAB, no wheezing, rales, or rhonchi, comfortable work of breathing Abd: non-distended, non-tender, soft, +bs in all four quadrants Ext: no clubbing, cyanosis, or edema Skin: warm, dry, intact, no rashes  ASSESSMENT/PLAN:   Hypertension Poorly controlled. Not checking her BP at home. - Start Losartan 25mg  daily, checking BMP today - Repeat BMP in 2 weeks - Start checking BP at home in the morning and recording, patient to bring log to next appointment in one month  Diabetes type 2, controlled ([place]) Not at goal with A1c of 7.0% and trend is increasing. States she tried metformin in the past but stopped taking it due to nausea and diarrhea - Start Jardiance 10mg   daily - F/u in one month for adherence - Patient to check CBGs at home, record and bring to next appointment  Hyperlipidemia Patient not currently on statin. LDL of 134. ASCVD risck score of 7.0%. Moderate intensity statin recommended. - Restart Lipitor 20mg  daily    Nuala Alpha, DO, PGY-3 [place]

## 2019-12-25 NOTE — Telephone Encounter (Signed)
Jardiance not covered by insurance.  Please see below of formulary.  Let "RN Team" know if you are changing to covered medications or would like to pursue a PA. Christen Bame, CMA       Cover My Meds info: Key: [id number]  Christen Bame, CMA

## 2019-12-25 NOTE — Assessment & Plan Note (Signed)
Patient not currently on statin. LDL of 134. ASCVD risck score of 7.0%. Moderate intensity statin recommended. - Restart Lipitor 20mg  daily

## 2019-12-25 NOTE — Assessment & Plan Note (Signed)
Not at goal with A1c of 7.0% and trend is increasing. States she tried metformin in the past but stopped taking it due to nausea and diarrhea - Start Jardiance 10mg  daily - F/u in one month for adherence - Patient to check CBGs at home, record and bring to next appointment

## 2019-12-25 NOTE — Assessment & Plan Note (Signed)
Poorly controlled. Not checking her BP at home. - Start Losartan 25mg  daily, checking BMP today - Repeat BMP in 2 weeks - Start checking BP at home in the morning and recording, patient to bring log to next appointment in one month

## 2019-12-26 ENCOUNTER — Other Ambulatory Visit: Payer: Self-pay | Admitting: Family Medicine

## 2019-12-26 DIAGNOSIS — M5416 Radiculopathy, lumbar region: Secondary | ICD-10-CM | POA: Diagnosis not present

## 2019-12-27 DIAGNOSIS — F4310 Post-traumatic stress disorder, unspecified: Secondary | ICD-10-CM | POA: Diagnosis not present

## 2019-12-27 NOTE — Telephone Encounter (Signed)
Completed PA info in [place] for Jardiance 10mg .  Status pending.  Will recheck status in 24 hours. Christen Bame, CMA

## 2019-12-28 DIAGNOSIS — F4310 Post-traumatic stress disorder, unspecified: Secondary | ICD-10-CM | POA: Diagnosis not present

## 2019-12-28 NOTE — Telephone Encounter (Signed)
Med approved for 12/27/2019 - 12/26/2020. [facility] informed.  Christen Bame, CMA

## 2019-12-31 DIAGNOSIS — F4310 Post-traumatic stress disorder, unspecified: Secondary | ICD-10-CM | POA: Diagnosis not present

## 2019-12-31 LAB — MICROALBUMIN / CREATININE URINE RATIO

## 2020-01-01 ENCOUNTER — Other Ambulatory Visit: Payer: Self-pay | Admitting: Neurosurgery

## 2020-01-04 DIAGNOSIS — F4310 Post-traumatic stress disorder, unspecified: Secondary | ICD-10-CM | POA: Diagnosis not present

## 2020-01-07 ENCOUNTER — Other Ambulatory Visit: Payer: Medicaid Other

## 2020-01-08 ENCOUNTER — Other Ambulatory Visit: Payer: Self-pay

## 2020-01-08 ENCOUNTER — Other Ambulatory Visit: Payer: Medicaid Other

## 2020-01-08 DIAGNOSIS — I10 Essential (primary) hypertension: Secondary | ICD-10-CM

## 2020-01-08 DIAGNOSIS — E119 Type 2 diabetes mellitus without complications: Secondary | ICD-10-CM | POA: Diagnosis not present

## 2020-01-09 LAB — BASIC METABOLIC PANEL
BUN/Creatinine Ratio: 15 (ref 9–23)
BUN: 9 mg/dL (ref 6–24)
CO2: 21 mmol/L (ref 20–29)
Calcium: 9.4 mg/dL (ref 8.7–10.2)
Chloride: 101 mmol/L (ref 96–106)
Creatinine, Ser: 0.61 mg/dL (ref 0.57–1.00)
GFR calc Af Amer: 130 mL/min/{1.73_m2} (ref >59)
GFR calc non Af Amer: 113 mL/min/{1.73_m2} (ref >59)
Glucose: 91 mg/dL (ref 65–99)
Potassium: 4.2 mmol/L (ref 3.5–5.2)
Sodium: 141 mmol/L (ref 134–144)

## 2020-01-14 DIAGNOSIS — F4310 Post-traumatic stress disorder, unspecified: Secondary | ICD-10-CM | POA: Diagnosis not present

## 2020-01-16 NOTE — Pre-Procedure Instructions (Signed)
[id number] [address], [place] [address] Phone: [phone] Fax: [phone]      Your procedure is scheduled on Wednesday, March 24th.  Report to [facility] Main Entrance "A" at 9:15 A.M., and check in at the Admitting office.  Call this number if you have problems the morning of surgery:  [phone]  Call [phone] if you have any questions prior to your surgery date Monday-Friday 8am-4pm    Remember:  Do not eat or drink after midnight the night before your surgery.    Take these medicines the morning of surgery with A SIP OF WATER  FLUoxetine (PROZAC) propranolol (INDERAL)   As of today, STOP taking any Aspirin (unless otherwise instructed by your surgeon), Aleve, Naproxen, Ibuprofen, Motrin, Advil, Goody's, BC's, all herbal medications, fish oil, and all vitamins.   WHAT DO I DO ABOUT MY DIABETES MEDICATION?  HOLD empagliflozin (JARDIANCE) THE DAY BEFORE AND THE DAY OF SURGERY.  HOW TO MANAGE YOUR DIABETES BEFORE AND AFTER SURGERY  Why is it important to control my blood sugar before and after surgery? . Improving blood sugar levels before and after surgery helps healing and can limit problems. . A way of improving blood sugar control is eating a healthy diet by: o  Eating less sugar and carbohydrates o  Increasing activity/exercise o  Talking with your doctor about reaching your blood sugar goals . High blood sugars (greater than 180 mg/dL) can raise your risk of infections and slow your recovery, so you will need to focus on controlling your diabetes during the weeks before surgery. . Make sure that the doctor who takes care of your diabetes knows about your planned surgery including the date and location.  How do I manage my blood sugar before surgery? . Check your blood sugar at least 4 times a day, starting 2 days before surgery, to make sure that the level is  not too high or low. . Check your blood sugar the morning of your surgery when you wake up and every 2 hours until you get to the Short Stay unit. o If your blood sugar is less than 70 mg/dL, you will need to treat for low blood sugar: - Do not take insulin. - Treat a low blood sugar (less than 70 mg/dL) with  cup of clear juice (cranberry or apple), 4 glucose tablets, OR glucose gel. - Recheck blood sugar in 15 minutes after treatment (to make sure it is greater than 70 mg/dL). If your blood sugar is not greater than 70 mg/dL on recheck, call [phone] for further instructions. . Report your blood sugar to the short stay nurse when you get to Short Stay.  . If you are admitted to the hospital after surgery: o Your blood sugar will be checked by the staff and you will probably be given insulin after surgery (instead of oral diabetes medicines) to make sure you have good blood sugar levels. o The goal for blood sugar control after surgery is 80-180 mg/dL.    The Morning of Surgery  Do not wear jewelry, make-up or nail polish.  Do not wear lotions, powders, or perfumes, or deodorant  Do not shave 48 hours prior to surgery.    Do not bring valuables to the hospital.  [facility] is not responsible for any belongings or valuables.  If you are a smoker, DO NOT Smoke 24 hours prior to surgery  If  you wear a CPAP at night please bring your mask the morning of surgery   Remember that you must have someone to transport you home after your surgery, and remain with you for 24 hours if you are discharged the same day.   Please bring cases for contacts, glasses, hearing aids, dentures or bridgework because it cannot be worn into surgery.    Leave your suitcase in the car.  After surgery it may be brought to your room.  For patients admitted to the hospital, discharge time will be determined by your treatment team.  Patients discharged the day of surgery will not be allowed to drive home.     Special instructions:   [place]- Preparing For Surgery  Before surgery, you can play an important role. Because skin is not sterile, your skin needs to be as free of germs as possible. You can reduce the number of germs on your skin by washing with CHG (chlorahexidine gluconate) Soap before surgery.  CHG is an antiseptic cleaner which kills germs and bonds with the skin to continue killing germs even after washing.    Oral Hygiene is also important to reduce your risk of infection.  Remember - BRUSH YOUR TEETH THE MORNING OF SURGERY WITH YOUR REGULAR TOOTHPASTE  Please do not use if you have an allergy to CHG or antibacterial soaps. If your skin becomes reddened/irritated stop using the CHG.  Do not shave (including legs and underarms) for at least 48 hours prior to first CHG shower. It is OK to shave your face.  Please follow these instructions carefully.   1. Shower the NIGHT BEFORE SURGERY and the MORNING OF SURGERY with CHG Soap.   2. If you chose to wash your hair, wash your hair first as usual with your normal shampoo.  3. After you shampoo, rinse your hair and body thoroughly to remove the shampoo.  4. Use CHG as you would any other liquid soap. You can apply CHG directly to the skin and wash gently with a scrungie or a clean washcloth.   5. Apply the CHG Soap to your body ONLY FROM THE NECK DOWN.  Do not use on open wounds or open sores. Avoid contact with your eyes, ears, mouth and genitals (private parts). Wash Face and genitals (private parts)  with your normal soap.   6. Wash thoroughly, paying special attention to the area where your surgery will be performed.  7. Thoroughly rinse your body with warm water from the neck down.  8. DO NOT shower/wash with your normal soap after using and rinsing off the CHG Soap.  9. Pat yourself dry with a CLEAN TOWEL.  10. Wear CLEAN PAJAMAS to bed the night before surgery, wear comfortable clothes the morning of  surgery  11. Place CLEAN SHEETS on your bed the night of your first shower and DO NOT SLEEP WITH PETS.    Day of Surgery:  Please shower the morning of surgery with the CHG soap Do not apply any deodorants/lotions. Please wear clean clothes to the hospital/surgery center.   Remember to brush your teeth WITH YOUR REGULAR TOOTHPASTE.   Please read over the following fact sheets that you were given.

## 2020-01-17 ENCOUNTER — Encounter (HOSPITAL_COMMUNITY)
Admission: RE | Admit: 2020-01-17 | Discharge: 2020-01-17 | Disposition: A | Discharge status: Home / Self Care | Payer: Medicaid Other | Source: Ambulatory Visit | Attending: Neurosurgery | Admitting: Neurosurgery

## 2020-01-17 ENCOUNTER — Other Ambulatory Visit: Payer: Self-pay

## 2020-01-17 ENCOUNTER — Encounter (HOSPITAL_COMMUNITY): Payer: Self-pay

## 2020-01-17 DIAGNOSIS — Z01812 Encounter for preprocedural laboratory examination: Secondary | ICD-10-CM | POA: Insufficient documentation

## 2020-01-17 LAB — BASIC METABOLIC PANEL
Anion gap: 12 (ref 5–15)
BUN: 10 mg/dL (ref 6–20)
CO2: 23 mmol/L (ref 22–32)
Calcium: 9.9 mg/dL (ref 8.9–10.3)
Chloride: 105 mmol/L (ref 98–111)
Creatinine, Ser: 0.61 mg/dL (ref 0.44–1.00)
GFR calc Af Amer: >60 mL/min (ref >60)
GFR calc non Af Amer: >60 mL/min (ref >60)
Glucose, Bld: 118 mg/dL — ABNORMAL HIGH (ref 70–99)
Potassium: 4.1 mmol/L (ref 3.5–5.1)
Sodium: 140 mmol/L (ref 135–145)

## 2020-01-17 LAB — CBC
HCT: 45.4 % (ref 36.0–46.0)
Hemoglobin: 14.9 g/dL (ref 12.0–15.0)
MCH: 29.2 pg (ref 26.0–34.0)
MCHC: 32.8 g/dL (ref 30.0–36.0)
MCV: 88.8 fL (ref 80.0–100.0)
Platelets: 328 10*3/uL (ref 150–400)
RBC: 5.11 MIL/uL (ref 3.87–5.11)
RDW: 14.5 % (ref 11.5–15.5)
WBC: 10.9 10*3/uL — ABNORMAL HIGH (ref 4.0–10.5)
nRBC: 0 % (ref 0.0–0.2)

## 2020-01-17 LAB — SURGICAL PCR SCREEN
MRSA, PCR: NEGATIVE
Staphylococcus aureus: NEGATIVE

## 2020-01-17 LAB — GLUCOSE, CAPILLARY: Glucose-Capillary: 126 mg/dL — ABNORMAL HIGH (ref 70–99)

## 2020-01-17 NOTE — Progress Notes (Signed)
PCP:  Nuala Alpha, DO Cardiologist:  Denies  EKG:  06/11/19 CXR:  N/A ECHO:  Denies Stress Test:  Denies Cardiac Cath:  Denies  Fasting Blood Sugar-  Patient does not have a glucometer.  Does not check blood sugar Checks Blood Sugar__0_ times a day A1C 7.0 on 12/24/19  Covid test 01/21/20   Patient denies shortness of breath, fever, cough, and chest pain at PAT appointment.  Patient verbalized understanding of instructions provided today at the PAT appointment.  Patient asked to review instructions at home and day of surgery.

## 2020-01-21 ENCOUNTER — Other Ambulatory Visit (HOSPITAL_COMMUNITY)
Admission: RE | Admit: 2020-01-21 | Discharge: 2020-01-21 | Disposition: A | Discharge status: Home / Self Care | Payer: Medicaid Other | Source: Ambulatory Visit | Attending: Neurosurgery | Admitting: Neurosurgery

## 2020-01-21 DIAGNOSIS — Z20822 Contact with and (suspected) exposure to covid-19: Secondary | ICD-10-CM | POA: Insufficient documentation

## 2020-01-21 DIAGNOSIS — Z01812 Encounter for preprocedural laboratory examination: Secondary | ICD-10-CM | POA: Insufficient documentation

## 2020-01-21 LAB — SARS CORONAVIRUS 2 (TAT 6-24 HRS): SARS Coronavirus 2: NEGATIVE

## 2020-01-23 ENCOUNTER — Ambulatory Visit (HOSPITAL_COMMUNITY): Payer: Medicaid Other

## 2020-01-23 ENCOUNTER — Other Ambulatory Visit: Payer: Self-pay

## 2020-01-23 ENCOUNTER — Ambulatory Visit (HOSPITAL_COMMUNITY): Payer: Medicaid Other | Admitting: Certified Registered"

## 2020-01-23 ENCOUNTER — Encounter (HOSPITAL_COMMUNITY): Payer: Self-pay | Admitting: Neurosurgery

## 2020-01-23 ENCOUNTER — Encounter (HOSPITAL_COMMUNITY)
Admission: RE | Disposition: A | Discharge status: Home / Self Care | Payer: Self-pay | Source: Home / Self Care | Attending: Neurosurgery

## 2020-01-23 ENCOUNTER — Ambulatory Visit (HOSPITAL_COMMUNITY)
Admission: RE | Admit: 2020-01-23 | Discharge: 2020-01-24 | Disposition: A | Discharge status: Home / Self Care | Payer: Medicaid Other | Attending: Neurosurgery | Admitting: Neurosurgery

## 2020-01-23 DIAGNOSIS — Z8249 Family history of ischemic heart disease and other diseases of the circulatory system: Secondary | ICD-10-CM | POA: Diagnosis not present

## 2020-01-23 DIAGNOSIS — F419 Anxiety disorder, unspecified: Secondary | ICD-10-CM | POA: Diagnosis not present

## 2020-01-23 DIAGNOSIS — F17210 Nicotine dependence, cigarettes, uncomplicated: Secondary | ICD-10-CM | POA: Insufficient documentation

## 2020-01-23 DIAGNOSIS — M5126 Other intervertebral disc displacement, lumbar region: Secondary | ICD-10-CM | POA: Diagnosis present

## 2020-01-23 DIAGNOSIS — Z8614 Personal history of Methicillin resistant Staphylococcus aureus infection: Secondary | ICD-10-CM | POA: Diagnosis not present

## 2020-01-23 DIAGNOSIS — Z90721 Acquired absence of ovaries, unilateral: Secondary | ICD-10-CM | POA: Insufficient documentation

## 2020-01-23 DIAGNOSIS — Z87892 Personal history of anaphylaxis: Secondary | ICD-10-CM | POA: Insufficient documentation

## 2020-01-23 DIAGNOSIS — E785 Hyperlipidemia, unspecified: Secondary | ICD-10-CM | POA: Diagnosis not present

## 2020-01-23 DIAGNOSIS — Z881 Allergy status to other antibiotic agents status: Secondary | ICD-10-CM | POA: Diagnosis not present

## 2020-01-23 DIAGNOSIS — E1142 Type 2 diabetes mellitus with diabetic polyneuropathy: Secondary | ICD-10-CM | POA: Insufficient documentation

## 2020-01-23 DIAGNOSIS — M47818 Spondylosis without myelopathy or radiculopathy, sacral and sacrococcygeal region: Secondary | ICD-10-CM | POA: Insufficient documentation

## 2020-01-23 DIAGNOSIS — Z888 Allergy status to other drugs, medicaments and biological substances status: Secondary | ICD-10-CM | POA: Diagnosis not present

## 2020-01-23 DIAGNOSIS — Z981 Arthrodesis status: Secondary | ICD-10-CM | POA: Diagnosis not present

## 2020-01-23 DIAGNOSIS — Z79899 Other long term (current) drug therapy: Secondary | ICD-10-CM | POA: Diagnosis not present

## 2020-01-23 DIAGNOSIS — Z419 Encounter for procedure for purposes other than remedying health state, unspecified: Secondary | ICD-10-CM

## 2020-01-23 DIAGNOSIS — M5117 Intervertebral disc disorders with radiculopathy, lumbosacral region: Secondary | ICD-10-CM | POA: Diagnosis not present

## 2020-01-23 DIAGNOSIS — G43909 Migraine, unspecified, not intractable, without status migrainosus: Secondary | ICD-10-CM | POA: Diagnosis not present

## 2020-01-23 DIAGNOSIS — Z880 Allergy status to penicillin: Secondary | ICD-10-CM | POA: Insufficient documentation

## 2020-01-23 DIAGNOSIS — I10 Essential (primary) hypertension: Secondary | ICD-10-CM | POA: Diagnosis not present

## 2020-01-23 DIAGNOSIS — Z90710 Acquired absence of both cervix and uterus: Secondary | ICD-10-CM | POA: Insufficient documentation

## 2020-01-23 DIAGNOSIS — M5127 Other intervertebral disc displacement, lumbosacral region: Secondary | ICD-10-CM | POA: Diagnosis not present

## 2020-01-23 HISTORY — PX: LUMBAR LAMINECTOMY/DECOMPRESSION MICRODISCECTOMY: SHX5026

## 2020-01-23 LAB — HEMOGLOBIN A1C
Hgb A1c MFr Bld: 7.4 % — ABNORMAL HIGH (ref 4.8–5.6)
Mean Plasma Glucose: 165.68 mg/dL

## 2020-01-23 LAB — GLUCOSE, CAPILLARY
Glucose-Capillary: 133 mg/dL — ABNORMAL HIGH (ref 70–99)
Glucose-Capillary: 149 mg/dL — ABNORMAL HIGH (ref 70–99)
Glucose-Capillary: 186 mg/dL — ABNORMAL HIGH (ref 70–99)
Glucose-Capillary: 272 mg/dL — ABNORMAL HIGH (ref 70–99)

## 2020-01-23 SURGERY — LUMBAR LAMINECTOMY/DECOMPRESSION MICRODISCECTOMY 1 LEVEL
Anesthesia: General | Site: Back | Laterality: Right

## 2020-01-23 MED ORDER — ACETAMINOPHEN 500 MG PO TABS
1000.0000 mg | ORAL_TABLET | Freq: Once | ORAL | Status: AC
  Administered 2020-01-23: 1000 mg via ORAL
  Filled 2020-01-23: qty 2

## 2020-01-23 MED ORDER — MIDAZOLAM HCL 5 MG/5ML IJ SOLN
INTRAMUSCULAR | Status: DC | PRN
  Administered 2020-01-23: 2 mg via INTRAVENOUS

## 2020-01-23 MED ORDER — PROPOFOL 10 MG/ML IV BOLUS
INTRAVENOUS | Status: AC
  Filled 2020-01-23: qty 20

## 2020-01-23 MED ORDER — LACTATED RINGERS IV SOLN: INTRAVENOUS | Status: DC | PRN

## 2020-01-23 MED ORDER — LOSARTAN POTASSIUM 25 MG PO TABS
25.0000 mg | ORAL_TABLET | Freq: Every day | ORAL | Status: DC
  Administered 2020-01-23: 25 mg via ORAL
  Filled 2020-01-23 (×2): qty 1

## 2020-01-23 MED ORDER — LIDOCAINE 2% (20 MG/ML) 5 ML SYRINGE
INTRAMUSCULAR | Status: DC | PRN
  Administered 2020-01-23: 60 mg via INTRAVENOUS

## 2020-01-23 MED ORDER — MENTHOL 3 MG MT LOZG
1.0000 | LOZENGE | OROMUCOSAL | Status: DC | PRN
  Filled 2020-01-23: qty 9

## 2020-01-23 MED ORDER — INSULIN ASPART 100 UNIT/ML ~~LOC~~ SOLN
0.0000 [IU] | Freq: Every day | SUBCUTANEOUS | Status: DC
  Administered 2020-01-23: 3 [IU] via SUBCUTANEOUS

## 2020-01-23 MED ORDER — ONDANSETRON HCL 4 MG PO TABS
4.0000 mg | ORAL_TABLET | Freq: Four times a day (QID) | ORAL | Status: DC | PRN
  Administered 2020-01-24: 4 mg via ORAL
  Filled 2020-01-23: qty 1

## 2020-01-23 MED ORDER — SUMATRIPTAN SUCCINATE 50 MG PO TABS
50.0000 mg | ORAL_TABLET | ORAL | Status: DC | PRN
  Filled 2020-01-23: qty 1

## 2020-01-23 MED ORDER — PROPRANOLOL HCL 40 MG PO TABS
40.0000 mg | ORAL_TABLET | Freq: Every day | ORAL | Status: DC
  Administered 2020-01-24: 40 mg via ORAL
  Filled 2020-01-23: qty 1

## 2020-01-23 MED ORDER — SODIUM CHLORIDE 0.9 % IV SOLN: 250.0000 mL | INTRAVENOUS | Status: DC

## 2020-01-23 MED ORDER — HYDROMORPHONE HCL 1 MG/ML IJ SOLN
0.5000 mg | INTRAMUSCULAR | Status: DC | PRN
  Administered 2020-01-23: 0.5 mg via INTRAVENOUS
  Filled 2020-01-23: qty 0.5

## 2020-01-23 MED ORDER — FENTANYL CITRATE (PF) 250 MCG/5ML IJ SOLN
INTRAMUSCULAR | Status: AC
  Filled 2020-01-23: qty 5

## 2020-01-23 MED ORDER — BUPIVACAINE HCL (PF) 0.25 % IJ SOLN
INTRAMUSCULAR | Status: AC
  Filled 2020-01-23: qty 30

## 2020-01-23 MED ORDER — OXYCODONE HCL 5 MG PO TABS
10.0000 mg | ORAL_TABLET | ORAL | Status: DC | PRN
  Administered 2020-01-23 – 2020-01-24 (×7): 10 mg via ORAL
  Filled 2020-01-23 (×7): qty 2

## 2020-01-23 MED ORDER — LIDOCAINE 2% (20 MG/ML) 5 ML SYRINGE
INTRAMUSCULAR | Status: AC
  Filled 2020-01-23: qty 5

## 2020-01-23 MED ORDER — SUGAMMADEX SODIUM 200 MG/2ML IV SOLN
INTRAVENOUS | Status: DC | PRN
  Administered 2020-01-23: 200 mg via INTRAVENOUS

## 2020-01-23 MED ORDER — OXYCODONE HCL 5 MG/5ML PO SOLN: 5.0000 mg | Freq: Once | ORAL | Status: DC | PRN

## 2020-01-23 MED ORDER — PHENOL 1.4 % MT LIQD: 1.0000 | OROMUCOSAL | Status: DC | PRN

## 2020-01-23 MED ORDER — ACETAMINOPHEN 325 MG PO TABS: 650.0000 mg | ORAL_TABLET | ORAL | Status: DC | PRN

## 2020-01-23 MED ORDER — FENTANYL CITRATE (PF) 100 MCG/2ML IJ SOLN
INTRAMUSCULAR | Status: DC | PRN
  Administered 2020-01-23: 100 ug via INTRAVENOUS
  Administered 2020-01-23 (×3): 50 ug via INTRAVENOUS
  Administered 2020-01-23: 100 ug via INTRAVENOUS
  Administered 2020-01-23: 50 ug via INTRAVENOUS

## 2020-01-23 MED ORDER — VANCOMYCIN HCL IN DEXTROSE 1-5 GM/200ML-% IV SOLN
INTRAVENOUS | Status: AC
  Administered 2020-01-23: 1000 mg via INTRAVENOUS
  Filled 2020-01-23: qty 200

## 2020-01-23 MED ORDER — INSULIN ASPART 100 UNIT/ML ~~LOC~~ SOLN
0.0000 [IU] | Freq: Three times a day (TID) | SUBCUTANEOUS | Status: DC
  Administered 2020-01-24: 2 [IU] via SUBCUTANEOUS
  Administered 2020-01-24: 3 [IU] via SUBCUTANEOUS

## 2020-01-23 MED ORDER — PROPOFOL 10 MG/ML IV BOLUS
INTRAVENOUS | Status: DC | PRN
  Administered 2020-01-23: 200 mg via INTRAVENOUS

## 2020-01-23 MED ORDER — FLUOXETINE HCL 20 MG PO CAPS
20.0000 mg | ORAL_CAPSULE | Freq: Every day | ORAL | Status: DC
  Administered 2020-01-24: 20 mg via ORAL
  Filled 2020-01-23: qty 1

## 2020-01-23 MED ORDER — ROCURONIUM BROMIDE 50 MG/5ML IV SOSY
PREFILLED_SYRINGE | INTRAVENOUS | Status: DC | PRN
  Administered 2020-01-23: 50 mg via INTRAVENOUS

## 2020-01-23 MED ORDER — THROMBIN 5000 UNITS EX SOLR
CUTANEOUS | Status: DC | PRN
  Administered 2020-01-23 (×2): 5000 [IU] via TOPICAL

## 2020-01-23 MED ORDER — VANCOMYCIN HCL 1250 MG/250ML IV SOLN
1250.0000 mg | Freq: Once | INTRAVENOUS | Status: AC
  Administered 2020-01-23: 1250 mg via INTRAVENOUS
  Filled 2020-01-23: qty 250

## 2020-01-23 MED ORDER — EPHEDRINE SULFATE-NACL 50-0.9 MG/10ML-% IV SOSY
PREFILLED_SYRINGE | INTRAVENOUS | Status: DC | PRN
  Administered 2020-01-23: 10 mg via INTRAVENOUS

## 2020-01-23 MED ORDER — HYDROXYZINE HCL 50 MG/ML IM SOLN
50.0000 mg | Freq: Four times a day (QID) | INTRAMUSCULAR | Status: DC | PRN
  Administered 2020-01-23: 50 mg via INTRAMUSCULAR
  Filled 2020-01-23: qty 1

## 2020-01-23 MED ORDER — LIDOCAINE-EPINEPHRINE 1 %-1:100000 IJ SOLN
INTRAMUSCULAR | Status: DC | PRN
  Administered 2020-01-23: 10 mL

## 2020-01-23 MED ORDER — SODIUM CHLORIDE 0.9% FLUSH
3.0000 mL | Freq: Two times a day (BID) | INTRAVENOUS | Status: DC
  Administered 2020-01-23: 3 mL via INTRAVENOUS

## 2020-01-23 MED ORDER — SODIUM CHLORIDE 0.9 % IV SOLN: INTRAVENOUS | Status: DC | PRN

## 2020-01-23 MED ORDER — ALUM & MAG HYDROXIDE-SIMETH 200-200-20 MG/5ML PO SUSP: 30.0000 mL | Freq: Four times a day (QID) | ORAL | Status: DC | PRN

## 2020-01-23 MED ORDER — MIDAZOLAM HCL 2 MG/2ML IJ SOLN
INTRAMUSCULAR | Status: AC
  Filled 2020-01-23: qty 2

## 2020-01-23 MED ORDER — 0.9 % SODIUM CHLORIDE (POUR BTL) OPTIME
TOPICAL | Status: DC | PRN
  Administered 2020-01-23: 1000 mL

## 2020-01-23 MED ORDER — OXYCODONE HCL 5 MG PO TABS: 5.0000 mg | ORAL_TABLET | Freq: Once | ORAL | Status: DC | PRN

## 2020-01-23 MED ORDER — VANCOMYCIN HCL IN DEXTROSE 1-5 GM/200ML-% IV SOLN: 1000.0000 mg | INTRAVENOUS | Status: AC

## 2020-01-23 MED ORDER — ONDANSETRON HCL 4 MG/2ML IJ SOLN
INTRAMUSCULAR | Status: DC | PRN
  Administered 2020-01-23: 4 mg via INTRAVENOUS

## 2020-01-23 MED ORDER — CHLORHEXIDINE GLUCONATE CLOTH 2 % EX PADS: 6.0000 | MEDICATED_PAD | Freq: Once | CUTANEOUS | Status: DC

## 2020-01-23 MED ORDER — ACETAMINOPHEN 650 MG RE SUPP: 650.0000 mg | RECTAL | Status: DC | PRN

## 2020-01-23 MED ORDER — DEXAMETHASONE SODIUM PHOSPHATE 10 MG/ML IJ SOLN
10.0000 mg | Freq: Once | INTRAMUSCULAR | Status: AC
  Administered 2020-01-23: 10 mg via INTRAVENOUS
  Filled 2020-01-23: qty 1

## 2020-01-23 MED ORDER — LIDOCAINE-EPINEPHRINE 1 %-1:100000 IJ SOLN
INTRAMUSCULAR | Status: AC
  Filled 2020-01-23: qty 1

## 2020-01-23 MED ORDER — SODIUM CHLORIDE 0.9% FLUSH: 3.0000 mL | INTRAVENOUS | Status: DC | PRN

## 2020-01-23 MED ORDER — ONDANSETRON HCL 4 MG/2ML IJ SOLN
4.0000 mg | Freq: Four times a day (QID) | INTRAMUSCULAR | Status: DC | PRN
  Administered 2020-01-23 (×2): 4 mg via INTRAVENOUS
  Filled 2020-01-23 (×2): qty 2

## 2020-01-23 MED ORDER — HYDROMORPHONE HCL 1 MG/ML IJ SOLN: 0.2500 mg | INTRAMUSCULAR | Status: DC | PRN

## 2020-01-23 MED ORDER — PANTOPRAZOLE SODIUM 40 MG IV SOLR
40.0000 mg | Freq: Every day | INTRAVENOUS | Status: DC
  Administered 2020-01-23: 40 mg via INTRAVENOUS
  Filled 2020-01-23: qty 40

## 2020-01-23 MED ORDER — PROMETHAZINE HCL 25 MG/ML IJ SOLN: 6.2500 mg | INTRAMUSCULAR | Status: DC | PRN

## 2020-01-23 MED ORDER — KETOROLAC TROMETHAMINE 30 MG/ML IJ SOLN: 30.0000 mg | Freq: Once | INTRAMUSCULAR | Status: DC | PRN

## 2020-01-23 MED ORDER — HEMOSTATIC AGENTS (NO CHARGE) OPTIME
TOPICAL | Status: DC | PRN
  Administered 2020-01-23: 1 via TOPICAL

## 2020-01-23 MED ORDER — ACETAMINOPHEN ER 650 MG PO TBCR: 1300.0000 mg | EXTENDED_RELEASE_TABLET | Freq: Every day | ORAL | Status: DC

## 2020-01-23 MED ORDER — CYCLOBENZAPRINE HCL 10 MG PO TABS
10.0000 mg | ORAL_TABLET | Freq: Three times a day (TID) | ORAL | Status: DC | PRN
  Administered 2020-01-24: 10 mg via ORAL
  Filled 2020-01-23: qty 1

## 2020-01-23 MED ORDER — CANAGLIFLOZIN 100 MG PO TABS
100.0000 mg | ORAL_TABLET | Freq: Every day | ORAL | Status: DC
  Administered 2020-01-24: 100 mg via ORAL
  Filled 2020-01-23: qty 1

## 2020-01-23 MED ORDER — THROMBIN 5000 UNITS EX SOLR
CUTANEOUS | Status: AC
  Filled 2020-01-23: qty 10000

## 2020-01-23 SURGICAL SUPPLY — 56 items
BAG DECANTER FOR FLEXI CONT (MISCELLANEOUS) ×2 IMPLANT
BENZOIN TINCTURE PRP APPL 2/3 (GAUZE/BANDAGES/DRESSINGS) ×2 IMPLANT
BLADE CLIPPER SURG (BLADE) IMPLANT
BLADE SURG 11 STRL SS (BLADE) ×2 IMPLANT
BUR CUTTER 7.0 ROUND (BURR) ×2 IMPLANT
BUR MATCHSTICK NEURO 3.0 LAGG (BURR) ×2 IMPLANT
CANISTER SUCT 3000ML PPV (MISCELLANEOUS) ×2 IMPLANT
CARTRIDGE OIL MAESTRO DRILL (MISCELLANEOUS) ×1 IMPLANT
COVER WAND RF STERILE (DRAPES) ×1 IMPLANT
DECANTER SPIKE VIAL GLASS SM (MISCELLANEOUS) ×2 IMPLANT
DERMABOND ADVANCED (GAUZE/BANDAGES/DRESSINGS) ×1
DERMABOND ADVANCED .7 DNX12 (GAUZE/BANDAGES/DRESSINGS) ×1 IMPLANT
DIFFUSER DRILL AIR PNEUMATIC (MISCELLANEOUS) ×2 IMPLANT
DRAPE HALF SHEET 40X57 (DRAPES) ×1 IMPLANT
DRAPE LAPAROTOMY 100X72X124 (DRAPES) ×2 IMPLANT
DRAPE MICROSCOPE LEICA (MISCELLANEOUS) ×2 IMPLANT
DRAPE SURG 17X23 STRL (DRAPES) ×2 IMPLANT
DRSG OPSITE POSTOP 3X4 (GAUZE/BANDAGES/DRESSINGS) ×1 IMPLANT
DURAPREP 26ML APPLICATOR (WOUND CARE) ×2 IMPLANT
ELECT REM PT RETURN 9FT ADLT (ELECTROSURGICAL) ×2
ELECTRODE REM PT RTRN 9FT ADLT (ELECTROSURGICAL) ×1 IMPLANT
GAUZE 4X4 16PLY RFD (DISPOSABLE) IMPLANT
GAUZE SPONGE 4X4 12PLY STRL (GAUZE/BANDAGES/DRESSINGS) ×1 IMPLANT
GLOVE BIO SURGEON STRL SZ 6.5 (GLOVE) ×2 IMPLANT
GLOVE BIO SURGEON STRL SZ7 (GLOVE) ×1 IMPLANT
GLOVE BIO SURGEON STRL SZ8 (GLOVE) ×2 IMPLANT
GLOVE BIOGEL PI IND STRL 6.5 (GLOVE) IMPLANT
GLOVE BIOGEL PI IND STRL 7.0 (GLOVE) IMPLANT
GLOVE BIOGEL PI INDICATOR 6.5 (GLOVE) ×1
GLOVE BIOGEL PI INDICATOR 7.0 (GLOVE) ×2
GLOVE EXAM NITRILE XL STR (GLOVE) IMPLANT
GLOVE INDICATOR 8.5 STRL (GLOVE) ×2 IMPLANT
GLOVE SURG SS PI 7.5 STRL IVOR (GLOVE) ×4 IMPLANT
GOWN STRL REUS W/ TWL LRG LVL3 (GOWN DISPOSABLE) ×1 IMPLANT
GOWN STRL REUS W/ TWL XL LVL3 (GOWN DISPOSABLE) ×2 IMPLANT
GOWN STRL REUS W/TWL 2XL LVL3 (GOWN DISPOSABLE) IMPLANT
GOWN STRL REUS W/TWL LRG LVL3 (GOWN DISPOSABLE) ×4
GOWN STRL REUS W/TWL XL LVL3 (GOWN DISPOSABLE) ×6
KIT BASIN OR (CUSTOM PROCEDURE TRAY) ×2 IMPLANT
KIT TURNOVER KIT B (KITS) ×2 IMPLANT
NDL SPNL 22GX3.5 QUINCKE BK (NEEDLE) ×1 IMPLANT
NEEDLE HYPO 22GX1.5 SAFETY (NEEDLE) ×2 IMPLANT
NEEDLE SPNL 22GX3.5 QUINCKE BK (NEEDLE) ×2 IMPLANT
NS IRRIG 1000ML POUR BTL (IV SOLUTION) ×2 IMPLANT
OIL CARTRIDGE MAESTRO DRILL (MISCELLANEOUS) ×2
PACK LAMINECTOMY NEURO (CUSTOM PROCEDURE TRAY) ×2 IMPLANT
RUBBERBAND STERILE (MISCELLANEOUS) ×4 IMPLANT
SPONGE SURGIFOAM ABS GEL SZ50 (HEMOSTASIS) ×2 IMPLANT
STRIP CLOSURE SKIN 1/2X4 (GAUZE/BANDAGES/DRESSINGS) ×2 IMPLANT
SUT VIC AB 0 CT1 18XCR BRD8 (SUTURE) ×1 IMPLANT
SUT VIC AB 0 CT1 8-18 (SUTURE) ×2
SUT VIC AB 2-0 CT1 18 (SUTURE) ×2 IMPLANT
SUT VICRYL 4-0 PS2 18IN ABS (SUTURE) ×2 IMPLANT
TOWEL GREEN STERILE (TOWEL DISPOSABLE) ×2 IMPLANT
TOWEL GREEN STERILE FF (TOWEL DISPOSABLE) ×2 IMPLANT
WATER STERILE IRR 1000ML POUR (IV SOLUTION) ×2 IMPLANT

## 2020-01-23 NOTE — Transfer of Care (Signed)
Immediate Anesthesia Transfer of Care Note  Patient: [name]  Procedure(s) Performed: Microdiscectomy right - Lumbar five-Sacral one (Right Back)  Patient Location: PACU  Anesthesia Type:General  Level of Consciousness: patient cooperative and responds to stimulation  Airway & Oxygen Therapy: Patient Spontanous Breathing and Patient connected to face mask oxygen  Post-op Assessment: Report given to RN and Post -op Vital signs reviewed and stable  Post vital signs: Reviewed and stable  Last Vitals:  Vitals Value Taken Time  BP 166/95 01/23/20 1205  Temp    Pulse 74 01/23/20 1206  Resp 12 01/23/20 1206  SpO2 94 % 01/23/20 1206  Vitals shown include unvalidated device data.  Last Pain:  Vitals:   01/23/20 1205  TempSrc:   PainSc: (P) Asleep      Patients Stated Pain Goal: 2 ([id number])  Complications: No apparent anesthesia complications

## 2020-01-23 NOTE — Op Note (Signed)
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  Work-up revealed disclamation L5-S1 the right due to patient failed conservative treatment imaging findings and progression of clinical syndrome I recommended microdiscectomy on the right at L5-S1.  I extensively reviewed the risks and benefits of the operation with patient as well as perioperative course expectations of outcome and alternatives of surgery and she understood and agreed proceed forward.  Operative procedure: Patient brought into the OR was due to general esthesia positioned prone Wilson frame her back was prepped and draped in routine sterile fashion.  Preoperative x-ray localized the appropriate level so after infiltration of 10 cc lidocaine with epi midline incision was made and Bovie electrocautery was used take down subcutaneous tissue and subperiosteal dissection was carried lamina on the right L5-S1.  Intraoperative x-ray confirmed identification appropriate levels a laminotomy was begun with a 3 and 4 movement Kerrison punch and a high-speed drill.  Ligament flavum is identified and removed in piecemeal fashion under biting of the medial gutter allowed identification of the right S1 nerve root.  Then the operating microscope was draped and brought into the field and under microscopic illumination the S1 nerve was dissected off of a large disc herniation still contained within the ligament at L5-S1.  Annulotomy was made with 11 blade scalpel disc base was cleaned out pituitary rongeurs Epstein curettes a very large  central fragment was removed as well as lateral fragments at the end discectomy there is no further stenosis on thecal sac or S1 nerve root the foramina and medial thecal sac was easily explored with a coronary dilator to confirm decompression.  Wound was then copiously irrigated meticulous hemostasis was maintained Gelfoam was ON top of the dura and the muscle fascia approximate layers with Vicryl skin was closed running 4 subcuticular Dermabond benzoin Steri-Strips and a sterile dressing was applied patient recovery in stable condition.  At the end the case all needle count sponge counts were correct.

## 2020-01-23 NOTE — Anesthesia Preprocedure Evaluation (Addendum)
Anesthesia Evaluation  Patient identified by MRN, date of birth, ID band Patient awake    Reviewed: Allergy & Precautions, NPO status , Patient's Chart, lab work & pertinent test results, reviewed documented beta blocker date and time   Airway Mallampati: III  TM Distance: >3 FB Neck ROM: Full    Dental no notable dental hx.    Pulmonary Current Smoker and Patient abstained from smoking.,    Pulmonary exam normal breath sounds clear to auscultation       Cardiovascular hypertension, Pt. on medications and Pt. on home beta blockers Normal cardiovascular exam Rhythm:Regular Rate:Normal  ECG: NSR, rate 75   Neuro/Psych  Headaches, Anxiety    GI/Hepatic negative GI ROS, Neg liver ROS,   Endo/Other  negative endocrine ROSdiabetes  Renal/GU negative Renal ROS     Musculoskeletal  (+) Arthritis ,   Abdominal (+) + obese,   Peds  Hematology negative hematology ROS (+)   Anesthesia Other Findings HNP  Reproductive/Obstetrics S/p hysterectomy                            Anesthesia Physical Anesthesia Plan  ASA: II  Anesthesia Plan: General   Post-op Pain Management:    Induction: Intravenous  PONV Risk Score and Plan: 2 and Ondansetron, Dexamethasone, Midazolam and Treatment may vary due to age or medical condition  Airway Management Planned: Oral ETT  Additional Equipment:   Intra-op Plan:   Post-operative Plan: Extubation in OR  Informed Consent: I have reviewed the patients History and Physical, chart, labs and discussed the procedure including the risks, benefits and alternatives for the proposed anesthesia with the patient or authorized representative who has indicated his/her understanding and acceptance.     Dental advisory given  Plan Discussed with: CRNA  Anesthesia Plan Comments:        Anesthesia Quick Evaluation

## 2020-01-23 NOTE — Anesthesia Procedure Notes (Signed)
Procedure Name: Intubation Date/Time: 01/23/2020 10:43 AM Performed by: Lance Coon, CRNA Pre-anesthesia Checklist: Patient identified, Emergency Drugs available, Suction available, Patient being monitored and Timeout performed Patient Re-evaluated:Patient Re-evaluated prior to induction Oxygen Delivery Method: Circle system utilized Preoxygenation: Pre-oxygenation with 100% oxygen Induction Type: IV induction Ventilation: Mask ventilation without difficulty Laryngoscope Size: Miller and 2 Grade View: Grade I Tube type: Oral Tube size: 7.0 mm Number of attempts: 1 Airway Equipment and Method: Stylet Placement Confirmation: ETT inserted through vocal cords under direct vision,  positive ETCO2 and breath sounds checked- equal and bilateral Secured at: 21 cm Tube secured with: Tape Dental Injury: Teeth and Oropharynx as per pre-operative assessment

## 2020-01-23 NOTE — H&P (Signed)
[name] is an 39 y.o. female.   Chief Complaint: Back and right leg pain HPI: 39 year old female with back and right leg pain rating down back of her leg back for Lumbar foot consistent with an S1 nerve root pattern.  Work-up revealed disclamation causing severe compression of the right S1 nerve root.  Due to this failed conservative treatment imaging findings and progression of clinical syndrome I recommended a laminectomy microdiscectomy at L5-S1 on the right I extensively reviewed the risks and benefits of the operation with her as well as perioperative course expectations of outcome and alternatives of surgery and she understands and agrees to proceed forward.  Past Medical History:  Diagnosis Date  . Abdominal lipoma 05/2015  . Abdominal pain, RLQ 10/03/2017  . Acute medial meniscus tear of right knee 06/09/2019  . Anxiety   . Arthritis    lower back  . DM type 2 with diabetic peripheral neuropathy (HCC)    Borderline  . GERD (gastroesophageal reflux disease)   . Headache    Migraines  . History of MRSA infection ~ 2006   leg  . History of shingles   . Hx of hydronephrosis    following total hysterectomy surgery ; Right kidney ; see CT abdomen 10-02-17 epic; states " a plug had got damaged during the surgery so thats why they put the stent in"   . Hypertension    under control with med., has been on med. x 6 mos.  . Lumbosacral spondylolysis    see MRI 06-01-17 epic   . Mood swing   . Optic nerve swelling    seen by Dr Mora Bellman 09-07-17, did lumbar puncture 09-19-17; patient states " they never called me about results ut i have an appt with him in april"; patient also states  " my vision is still blurry and worse when i get the migraines, they think the migraines are causing it"  . Psoriasis     Past Surgical History:  Procedure Laterality Date  . CESAREAN SECTION  06/21/2008; 08/18/2010  . CESAREAN SECTION N/A 07/10/2014   Procedure: REPEAT CESAREAN SECTION;  Surgeon:  Daria Pastures, MD;  Location: [facility];  Service: Obstetrics;  Laterality: N/A;  . CHOLECYSTECTOMY  07/10/2008  . CYSTOSCOPY N/A 09/28/2017   Procedure: CYSTOSCOPY;  Surgeon: Bobbye Charleston, MD;  Location: [facility];  Service: Gynecology;  Laterality: N/A;  . CYSTOSCOPY Right 01/18/2018   Procedure: CYSTOSCOPY, RETROGRADE / PYELOGRAM, URETEROSCOPY;  Surgeon: Ceasar Mons, MD;  Location: [facility];  Service: Urology;  Laterality: Right;  . CYSTOSCOPY W/ URETERAL STENT PLACEMENT Right 10/03/2017   Procedure: CYSTOSCOPY WITH RETROGRADE PYELOGRAM/URETERAL STENT PLACEMENT;  Surgeon: Ceasar Mons, MD;  Location: [facility];  Service: Urology;  Laterality: Right;  . CYSTOSCOPY W/ URETERAL STENT PLACEMENT Right 02/22/2018   Procedure: CYSTOSCOPY WITH URETERAL STENT PLACEMENT;  Surgeon: Ceasar Mons, MD;  Location: [facility];  Service: Urology;  Laterality: Right;  . KNEE ARTHROSCOPY WITH MEDIAL MENISECTOMY Right 06/26/2019   Procedure: KNEE ARTHROSCOPY WITH PARTIAL MEDIAL MENISECTOMY, REMOVAL LOOSE BODY;  Surgeon: Elsie Saas, MD;  Location: [facility];  Service: Orthopedics;  Laterality: Right;  . LIPOMA EXCISION  2014   back  . LIPOMA EXCISION N/A 05/21/2015   Procedure: EXCISION LIPOMAS ON ABDOMEN;  Surgeon: Erroll Luna, MD;  Location: [facility];  Service: General;  Laterality: N/A;  . LUMBAR PUNCTURE  09/19/17   [facility], Dr. Curt Bears  . OOPHORECTOMY  Left 09/28/2017   Procedure: OOPHORECTOMY;  Surgeon: Bobbye Charleston, MD;  Location: [facility];  Service: Gynecology;  Laterality: Left;  . ROBOTIC ASSISTED LAPAROSCOPIC LYSIS OF ADHESION N/A 09/28/2017   Procedure: ROBOTIC ASSISTED LAPAROSCOPIC LYSIS OF ADHESION;  Surgeon: Bobbye Charleston, MD;  Location: [facility];  Service: Gynecology;  Laterality: N/A;  . ROBOTIC ASSISTED TOTAL HYSTERECTOMY WITH BILATERAL SALPINGO OOPHERECTOMY Bilateral 09/28/2017    Procedure: ROBOTIC ASSISTED TOTAL HYSTERECTOMY WITH BILATERAL SALPINGECTOMY;  Surgeon: Bobbye Charleston, MD;  Location: [facility];  Service: Gynecology;  Laterality: Bilateral;  . ROBOTICALLY ASSISTED LAPAROSCOPIC URETERAL RE-IMPLANTATION Right 02/22/2018   Procedure: ROBOTICALLY ASSISTED LAPAROSCOPIC URETERAL RE-IMPLANTATION;  Surgeon: Ceasar Mons, MD;  Location: [facility];  Service: Urology;  Laterality: Right;  ONLY NEEDS 240 MIN TOTAL FOR ALL PROCEDURES  . TUBAL LIGATION    . WISDOM TOOTH EXTRACTION      Family History  Problem Relation Age of Onset  . Drug abuse Mother   . Heart disease Maternal Grandmother   . Heart disease Maternal Grandfather    Social History:  reports that she has been smoking cigarettes. She started smoking about 5 years ago. She has a 8.00 pack-year smoking history. She has never used smokeless tobacco. She reports that she does not drink alcohol or use drugs.  Allergies:  Allergies  Allergen Reactions  . Bee Venom Anaphylaxis  . Penicillins Anaphylaxis and Other (See Comments)    Has patient had a PCN reaction causing immediate rash, facial/tongue/throat swelling, SOB or lightheadedness with hypotension: Yes Has patient had a PCN reaction causing severe rash involving mucus membranes or skin necrosis: No Has patient had a PCN reaction that required hospitalization: Yes Has patient had a PCN reaction occurring within the last 10 years: No If all of the above answers are "NO", then may proceed with Cephalosporin use.   . Mushroom Extract Complex Nausea And Vomiting  . Ciprofloxacin Rash  . Powder Itching, Rash and Other (See Comments)    The powder from latex gloves    Medications Prior to Admission  Medication Sig Dispense Refill  . acetaminophen (TYLENOL) 650 MG CR tablet Take 1,300 mg by mouth 5 (five) times daily.    . empagliflozin (JARDIANCE) 10 MG TABS tablet Take 10 mg by mouth daily. 90 tablet 3  . FLUoxetine (PROZAC) 20 MG capsule Take  20 mg by mouth daily.    Marland Kitchen losartan (COZAAR) 25 MG tablet Take 1 tablet (25 mg total) by mouth at bedtime. 90 tablet 3  . propranolol (INDERAL) 40 MG tablet Take 1 tablet (40 mg total) by mouth daily. 45 tablet 0  . SUMAtriptan (IMITREX) 50 MG tablet Take 50 mg by mouth every 2 (two) hours as needed for migraine. May repeat in 2 hours if headache persists or recurs.      Results for orders placed or performed during the hospital encounter of 01/23/20 (from the past 48 hour(s))  Glucose, capillary     Status: Abnormal   Collection Time: 01/23/20  9:07 AM  Result Value Ref Range   Glucose-Capillary 133 (H) 70 - 99 mg/dL    Comment: Glucose reference range applies only to samples taken after fasting for at least 8 hours.   No results found.  Review of Systems  Musculoskeletal: Positive for back pain.  Neurological: Positive for numbness.    Blood pressure (!) 168/89, pulse 65, temperature 98.2 F (36.8 C), temperature source Oral, resp. rate 20, height 5\' 6"  (1.676 m), weight 94.8 kg, last menstrual  period 09/11/2017, SpO2 96 %. Physical Exam  Constitutional: She is oriented to person, place, and time. She appears well-developed.  HENT:  Head: Normocephalic.  Eyes: Pupils are equal, round, and reactive to light.  Cardiovascular: Normal rate.  Respiratory: Effort normal.  GI: Soft.  Musculoskeletal:        General: Normal range of motion.     Cervical back: Normal range of motion.  Neurological: She is alert and oriented to person, place, and time. She has normal strength. GCS eye subscore is 4. GCS verbal subscore is 5. GCS motor subscore is 6.  Strength is 5 out of 5 iliopsoas, quads, hamstrings, gastroc, into tibialis, and EHL.     Assessment/Plan 39 year old presents for right-sided L5-S1 microdiscectomy  Charna Neeb P, MD 01/23/2020, 9:45 AM

## 2020-01-23 NOTE — Anesthesia Postprocedure Evaluation (Signed)
Anesthesia Post Note  Patient: [name]  Procedure(s) Performed: Microdiscectomy right - Lumbar five-Sacral one (Right Back)     Patient location during evaluation: PACU Anesthesia Type: General Level of consciousness: awake and alert Pain management: pain level controlled Vital Signs Assessment: post-procedure vital signs reviewed and stable Respiratory status: spontaneous breathing, nonlabored ventilation, respiratory function stable and patient connected to nasal cannula oxygen Cardiovascular status: blood pressure returned to baseline and stable Postop Assessment: no apparent nausea or vomiting Anesthetic complications: no    Last Vitals:  Vitals:   01/23/20 1355 01/23/20 2001  BP: 126/65 109/62  Pulse: 63 80  Resp: 20 20  Temp: 36.5 C 36.6 C  SpO2: 95% 95%    Last Pain:  Vitals:   01/23/20 2001  TempSrc: Oral  PainSc:                  [name] [name]

## 2020-01-24 DIAGNOSIS — I10 Essential (primary) hypertension: Secondary | ICD-10-CM | POA: Diagnosis not present

## 2020-01-24 DIAGNOSIS — Z90710 Acquired absence of both cervix and uterus: Secondary | ICD-10-CM | POA: Diagnosis not present

## 2020-01-24 DIAGNOSIS — F419 Anxiety disorder, unspecified: Secondary | ICD-10-CM | POA: Diagnosis not present

## 2020-01-24 DIAGNOSIS — Z881 Allergy status to other antibiotic agents status: Secondary | ICD-10-CM | POA: Diagnosis not present

## 2020-01-24 DIAGNOSIS — M47818 Spondylosis without myelopathy or radiculopathy, sacral and sacrococcygeal region: Secondary | ICD-10-CM | POA: Diagnosis not present

## 2020-01-24 DIAGNOSIS — Z90721 Acquired absence of ovaries, unilateral: Secondary | ICD-10-CM | POA: Diagnosis not present

## 2020-01-24 DIAGNOSIS — Z888 Allergy status to other drugs, medicaments and biological substances status: Secondary | ICD-10-CM | POA: Diagnosis not present

## 2020-01-24 DIAGNOSIS — Z8614 Personal history of Methicillin resistant Staphylococcus aureus infection: Secondary | ICD-10-CM | POA: Diagnosis not present

## 2020-01-24 DIAGNOSIS — E1142 Type 2 diabetes mellitus with diabetic polyneuropathy: Secondary | ICD-10-CM | POA: Diagnosis not present

## 2020-01-24 DIAGNOSIS — Z8249 Family history of ischemic heart disease and other diseases of the circulatory system: Secondary | ICD-10-CM | POA: Diagnosis not present

## 2020-01-24 DIAGNOSIS — Z880 Allergy status to penicillin: Secondary | ICD-10-CM | POA: Diagnosis not present

## 2020-01-24 DIAGNOSIS — Z79899 Other long term (current) drug therapy: Secondary | ICD-10-CM | POA: Diagnosis not present

## 2020-01-24 DIAGNOSIS — F17210 Nicotine dependence, cigarettes, uncomplicated: Secondary | ICD-10-CM | POA: Diagnosis not present

## 2020-01-24 DIAGNOSIS — M5127 Other intervertebral disc displacement, lumbosacral region: Secondary | ICD-10-CM | POA: Diagnosis not present

## 2020-01-24 DIAGNOSIS — G43909 Migraine, unspecified, not intractable, without status migrainosus: Secondary | ICD-10-CM | POA: Diagnosis not present

## 2020-01-24 DIAGNOSIS — Z87892 Personal history of anaphylaxis: Secondary | ICD-10-CM | POA: Diagnosis not present

## 2020-01-24 LAB — GLUCOSE, CAPILLARY
Glucose-Capillary: 163 mg/dL — ABNORMAL HIGH (ref 70–99)
Glucose-Capillary: 142 mg/dL — ABNORMAL HIGH (ref 70–99)

## 2020-01-24 MED ORDER — DEXAMETHASONE SODIUM PHOSPHATE 4 MG/ML IJ SOLN: 4.0000 mg | Freq: Four times a day (QID) | INTRAMUSCULAR | Status: DC

## 2020-01-24 MED ORDER — METHOCARBAMOL 500 MG PO TABS: 500.0000 mg | ORAL_TABLET | Freq: Four times a day (QID) | ORAL | 0 refills | Status: DC

## 2020-01-24 MED ORDER — OXYCODONE-ACETAMINOPHEN 5-325 MG PO TABS: 1.0000 | ORAL_TABLET | ORAL | 0 refills | Status: DC | PRN

## 2020-01-24 MED ORDER — PANTOPRAZOLE SODIUM 40 MG PO TBEC: 40.0000 mg | DELAYED_RELEASE_TABLET | Freq: Every day | ORAL | Status: DC

## 2020-01-24 MED ORDER — DEXAMETHASONE 4 MG PO TABS
4.0000 mg | ORAL_TABLET | Freq: Four times a day (QID) | ORAL | Status: DC
  Administered 2020-01-24 (×2): 4 mg via ORAL
  Filled 2020-01-24 (×2): qty 1

## 2020-01-24 NOTE — Progress Notes (Signed)
Subjective: Patient reports leg pain is better but having right leg pain the radiates down the anterior leg to her foot.   Objective: Vital signs in last 24 hours: Temp:  [97.7 F (36.5 C)-98.4 F (36.9 C)] 98.2 F (36.8 C) (03/25 0331) Pulse Rate:  [53-92] 64 (03/25 0331) Resp:  [9-20] 20 (03/25 0331) BP: (107-168)/(62-96) 107/69 (03/25 0331) SpO2:  [92 %-98 %] 96 % (03/25 0331) Weight:  [94.8 kg] 94.8 kg (03/24 0905)  Intake/Output from previous day: 03/24 0701 - 03/25 0700 In: 600 [I.V.:500] Out: 50 [Blood:50] Intake/Output this shift: No intake/output data recorded.  Neurologic: Grossly normal  Lab Results: Lab Results  Component Value Date   WBC 10.9 (H) 01/17/2020   HGB 14.9 01/17/2020   HCT 45.4 01/17/2020   MCV 88.8 01/17/2020   PLT 328 01/17/2020   No results found for: INR, PROTIME BMET Lab Results  Component Value Date   NA 140 01/17/2020   K 4.1 01/17/2020   CL 105 01/17/2020   CO2 23 01/17/2020   GLUCOSE 118 (H) 01/17/2020   BUN 10 01/17/2020   CREATININE 0.61 01/17/2020   CALCIUM 9.9 01/17/2020    Studies/Results: DG Lumbar Spine 2-3 Views  Result Date: 01/23/2020 CLINICAL DATA:  Intraoperative localization EXAM: LUMBAR SPINE - 2-3 VIEW COMPARISON:  09/12/2019 FINDINGS: Two films were obtained intraoperatively for localization purposes. The initial film demonstrates a needle in the soft tissues posterior to the S2 vertebral body. Subsequent images demonstrate surgical retractors and instruments at the L5-S1 level. IMPRESSION: Intraoperative localization as described. Electronically Signed   By: Inez Catalina M.D.   On: 01/23/2020 12:53    Assessment/Plan: Postop day 1 lumbar microdiscectomy. She is still having some radicular pain. Will start her on decadron and see how she does today.   LOS: 0 days    Ocie Cornfield [facility] 01/24/2020, 7:34 AM

## 2020-01-24 NOTE — Plan of Care (Signed)
Pt given D/C instructions with verbal understanding. Rx's were sent to pharmacy by MD. Pt's incision is clean and dry with no sign of infection. Pt's IV was removed prior to D/C. Pt D/C'd home via wheelchair per MD order. Pt is stable @ and has no other needs at this time. Holli Humbles, RN

## 2020-01-24 NOTE — Discharge Summary (Signed)
Physician Discharge Summary  Patient ID: [name] MRN: [id number] DOB/AGE: 02/25/1981 39 y.o.  Admit date: 01/23/2020 Discharge date: 01/24/2020  Admission Diagnoses: Right S1 radiculopathy from herniated nucleus pulposus L5-S1 right    Discharge Diagnoses: same   Discharged Condition: good  Hospital Course: The patient was admitted on 01/23/2020 and taken to the operating room where the patient underwent right L5-S1 microdiscectomy. The patient tolerated the procedure well and was taken to the recovery room and then to the floor in stable condition. The hospital course was routine. There were no complications. The wound remained clean dry and intact. Pt had appropriate back soreness. Complains of some right leg pain or new N/T/W. The patient remained afebrile with stable vital signs, and tolerated a regular diet. The patient continued to increase activities, and pain was well controlled with oral pain medications.   Consults: None  Significant Diagnostic Studies:  Results for orders placed or performed during the hospital encounter of 01/23/20  Glucose, capillary  Result Value Ref Range   Glucose-Capillary 133 (H) 70 - 99 mg/dL  Glucose, capillary  Result Value Ref Range   Glucose-Capillary 149 (H) 70 - 99 mg/dL  Glucose, capillary  Result Value Ref Range   Glucose-Capillary 186 (H) 70 - 99 mg/dL  Glucose, capillary  Result Value Ref Range   Glucose-Capillary 272 (H) 70 - 99 mg/dL   Comment 1 Notify RN    Comment 2 Document in Chart   Hemoglobin A1c  Result Value Ref Range   Hgb A1c MFr Bld 7.4 (H) 4.8 - 5.6 %   Mean Plasma Glucose 165.68 mg/dL  Glucose, capillary  Result Value Ref Range   Glucose-Capillary 163 (H) 70 - 99 mg/dL   Comment 1 Notify RN    Comment 2 Document in Chart   Glucose, capillary  Result Value Ref Range   Glucose-Capillary 142 (H) 70 - 99 mg/dL   Comment 1 Notify RN    Comment 2 Document in Chart     DG Lumbar Spine 2-3 Views  Result  Date: 01/23/2020 CLINICAL DATA:  Intraoperative localization EXAM: LUMBAR SPINE - 2-3 VIEW COMPARISON:  09/12/2019 FINDINGS: Two films were obtained intraoperatively for localization purposes. The initial film demonstrates a needle in the soft tissues posterior to the S2 vertebral body. Subsequent images demonstrate surgical retractors and instruments at the L5-S1 level. IMPRESSION: Intraoperative localization as described. Electronically Signed   By: Inez Catalina M.D.   On: 01/23/2020 12:53    Antibiotics:  Anti-infectives (From admission, onward)   Start     Dose/Rate Route Frequency Ordered Stop   01/23/20 2100  vancomycin (VANCOREADY) IVPB 1250 mg/250 mL     1,250 mg 166.7 mL/hr over 90 Minutes Intravenous  Once 01/23/20 1412 01/23/20 2111   01/23/20 1132  bacitracin 50,000 Units in sodium chloride 0.9 % 500 mL irrigation  Status:  Discontinued       As needed 01/23/20 1133 01/23/20 1159   01/23/20 0900  vancomycin (VANCOCIN) IVPB 1000 mg/200 mL premix     1,000 mg 200 mL/hr over 60 Minutes Intravenous On call to O.R. 01/23/20 0859 01/23/20 1048      Discharge Exam: Blood pressure 112/71, pulse 65, temperature 98.3 F (36.8 C), temperature source Oral, resp. rate 16, height 5\' 6"  (1.676 m), weight 94.8 kg, last menstrual period 09/11/2017, SpO2 97 %. Neurologic: Grossly normal Ambulating and voiding well, incision cdi  Discharge Medications:   Allergies as of 01/24/2020      Reactions  Bee Venom Anaphylaxis   Penicillins Anaphylaxis, Other (See Comments)   Has patient had a PCN reaction causing immediate rash, facial/tongue/throat swelling, SOB or lightheadedness with hypotension: Yes Has patient had a PCN reaction causing severe rash involving mucus membranes or skin necrosis: No Has patient had a PCN reaction that required hospitalization: Yes Has patient had a PCN reaction occurring within the last 10 years: No If all of the above answers are "NO", then may proceed with  Cephalosporin use.   Mushroom Extract Complex Nausea And Vomiting   Ciprofloxacin Rash   Powder Itching, Rash, Other (See Comments)   The powder from latex gloves      Medication List    TAKE these medications   acetaminophen 650 MG CR tablet Commonly known as: TYLENOL Take 1,300 mg by mouth 5 (five) times daily.   empagliflozin 10 MG Tabs tablet Commonly known as: JARDIANCE Take 10 mg by mouth daily.   FLUoxetine 20 MG capsule Commonly known as: PROZAC Take 20 mg by mouth daily.   losartan 25 MG tablet Commonly known as: COZAAR Take 1 tablet (25 mg total) by mouth at bedtime.   methocarbamol 500 MG tablet Commonly known as: Robaxin Take 1 tablet (500 mg total) by mouth 4 (four) times daily.   oxyCODONE-acetaminophen 5-325 MG tablet Commonly known as: Percocet Take 1 tablet by mouth every 4 (four) hours as needed for severe pain.   propranolol 40 MG tablet Commonly known as: INDERAL Take 1 tablet (40 mg total) by mouth daily.   SUMAtriptan 50 MG tablet Commonly known as: IMITREX Take 50 mg by mouth every 2 (two) hours as needed for migraine. May repeat in 2 hours if headache persists or recurs.       Disposition: home  Final Dx: microdiscectomy L5-S1       Signed: Ocie Cornfield Valery Chance 01/24/2020, 2:41 PM

## 2020-01-26 ENCOUNTER — Other Ambulatory Visit: Payer: Self-pay | Admitting: Family Medicine

## 2020-01-26 DIAGNOSIS — G8929 Other chronic pain: Secondary | ICD-10-CM

## 2020-01-26 DIAGNOSIS — I10 Essential (primary) hypertension: Secondary | ICD-10-CM

## 2020-02-06 DIAGNOSIS — M5416 Radiculopathy, lumbar region: Secondary | ICD-10-CM | POA: Diagnosis not present

## 2020-02-06 DIAGNOSIS — M5126 Other intervertebral disc displacement, lumbar region: Secondary | ICD-10-CM | POA: Diagnosis not present

## 2020-02-11 DIAGNOSIS — F4310 Post-traumatic stress disorder, unspecified: Secondary | ICD-10-CM | POA: Diagnosis not present

## 2020-02-18 DIAGNOSIS — F4310 Post-traumatic stress disorder, unspecified: Secondary | ICD-10-CM | POA: Diagnosis not present

## 2020-03-03 DIAGNOSIS — F4310 Post-traumatic stress disorder, unspecified: Secondary | ICD-10-CM | POA: Diagnosis not present

## 2020-03-05 DIAGNOSIS — M5416 Radiculopathy, lumbar region: Secondary | ICD-10-CM | POA: Diagnosis not present

## 2020-03-11 ENCOUNTER — Other Ambulatory Visit: Payer: Self-pay

## 2020-03-11 ENCOUNTER — Ambulatory Visit: Payer: Medicaid Other | Attending: Physician Assistant

## 2020-03-11 DIAGNOSIS — Z9889 Other specified postprocedural states: Secondary | ICD-10-CM | POA: Diagnosis not present

## 2020-03-11 DIAGNOSIS — R262 Difficulty in walking, not elsewhere classified: Secondary | ICD-10-CM | POA: Diagnosis not present

## 2020-03-11 DIAGNOSIS — R202 Paresthesia of skin: Secondary | ICD-10-CM | POA: Insufficient documentation

## 2020-03-11 DIAGNOSIS — R200 Anesthesia of skin: Secondary | ICD-10-CM | POA: Diagnosis not present

## 2020-03-11 NOTE — Patient Instructions (Signed)
Cervical side bend /rotation and levator to RT  X 2-3 reps every 2 hours

## 2020-03-11 NOTE — Therapy (Signed)
[place], [place], [address] Phone: [phone]   Fax:  [phone]  Physical Therapy Evaluation  Patient Details  Name: [name] MRN: [id number] Date of Birth: 1981/08/18 Referring Provider (PT): Kary Kos, MD   Encounter Date: 03/11/2020  PT End of Session - 03/11/20 1054    Visit Number  1    Number of Visits  4    Date for PT Re-Evaluation  04/04/20    Authorization Type  MCD- auth submitted 05/11//21    PT Start Time  [id number]    PT Stop Time  1130    PT Time Calculation (min)  55 min    Activity Tolerance  Patient tolerated treatment well;No increased pain    Behavior During Therapy  WFL for tasks assessed/performed       Past Medical History:  Diagnosis Date  . Abdominal lipoma 05/2015  . Abdominal pain, RLQ 10/03/2017  . Acute medial meniscus tear of right knee 06/09/2019  . Anxiety   . Arthritis    lower back  . DM type 2 with diabetic peripheral neuropathy (HCC)    Borderline  . GERD (gastroesophageal reflux disease)   . Headache    Migraines  . History of MRSA infection ~ 2006   leg  . History of shingles   . Hx of hydronephrosis    following total hysterectomy surgery ; Right kidney ; see CT abdomen 10-02-17 epic; states " a plug had got damaged during the surgery so thats why they put the stent in"   . Hypertension    under control with med., has been on med. x 6 mos.  . Lumbosacral spondylolysis    see MRI 06-01-17 epic   . Mood swing   . Optic nerve swelling    seen by Dr Mora Bellman 09-07-17, did lumbar puncture 09-19-17; patient states " they never called me about results ut i have an appt with him in april"; patient also states  " my vision is still blurry and worse when i get the migraines, they think the migraines are causing it"  . Psoriasis     Past Surgical History:  Procedure Laterality Date  . CESAREAN SECTION  06/21/2008; 08/18/2010  . CESAREAN SECTION N/A  07/10/2014   Procedure: REPEAT CESAREAN SECTION;  Surgeon: Daria Pastures, MD;  Location: [facility];  Service: Obstetrics;  Laterality: N/A;  . CHOLECYSTECTOMY  07/10/2008  . CYSTOSCOPY N/A 09/28/2017   Procedure: CYSTOSCOPY;  Surgeon: Bobbye Charleston, MD;  Location: [facility];  Service: Gynecology;  Laterality: N/A;  . CYSTOSCOPY Right 01/18/2018   Procedure: CYSTOSCOPY, RETROGRADE / PYELOGRAM, URETEROSCOPY;  Surgeon: Ceasar Mons, MD;  Location:  [facility];  Service: Urology;  Laterality: Right;  . CYSTOSCOPY W/ URETERAL STENT PLACEMENT Right 10/03/2017   Procedure: CYSTOSCOPY WITH RETROGRADE PYELOGRAM/URETERAL STENT PLACEMENT;  Surgeon: Ceasar Mons, MD;  Location: [facility];  Service: Urology;  Laterality: Right;  . CYSTOSCOPY W/ URETERAL STENT PLACEMENT Right 02/22/2018   Procedure: CYSTOSCOPY WITH URETERAL STENT PLACEMENT;  Surgeon: Ceasar Mons, MD;  Location: [facility];  Service: Urology;  Laterality: Right;  . KNEE ARTHROSCOPY WITH MEDIAL MENISECTOMY Right 06/26/2019   Procedure: KNEE ARTHROSCOPY WITH PARTIAL MEDIAL MENISECTOMY, REMOVAL LOOSE BODY;  Surgeon: Elsie Saas, MD;  Location: [facility];  Service: Orthopedics;  Laterality: Right;  . LIPOMA EXCISION  2014   back  . LIPOMA EXCISION N/A 05/21/2015   Procedure: EXCISION LIPOMAS ON ABDOMEN;  Surgeon: Erroll Luna, MD;  Location: [facility];  Service: General;  Laterality: N/A;  . LUMBAR LAMINECTOMY/DECOMPRESSION MICRODISCECTOMY Right 01/23/2020   Procedure: Microdiscectomy right - Lumbar five-Sacral one;  Surgeon: Kary Kos, MD;  Location: [facility];  Service: Neurosurgery;  Laterality: Right;  . LUMBAR PUNCTURE  09/19/17    Imaging, Dr. Curt Bears  . OOPHORECTOMY Left 09/28/2017   Procedure: OOPHORECTOMY;  Surgeon: Bobbye Charleston, MD;  Location: [facility];  Service: Gynecology;  Laterality: Left;  . ROBOTIC ASSISTED LAPAROSCOPIC LYSIS OF ADHESION  N/A 09/28/2017   Procedure: ROBOTIC ASSISTED LAPAROSCOPIC LYSIS OF ADHESION;  Surgeon: Bobbye Charleston, MD;  Location: [facility];  Service: Gynecology;  Laterality: N/A;  . ROBOTIC ASSISTED TOTAL HYSTERECTOMY WITH BILATERAL SALPINGO OOPHERECTOMY Bilateral 09/28/2017   Procedure: ROBOTIC ASSISTED TOTAL HYSTERECTOMY WITH BILATERAL SALPINGECTOMY;  Surgeon: Bobbye Charleston, MD;  Location: [facility];  Service: Gynecology;  Laterality: Bilateral;  . ROBOTICALLY ASSISTED LAPAROSCOPIC URETERAL RE-IMPLANTATION Right 02/22/2018   Procedure: ROBOTICALLY ASSISTED LAPAROSCOPIC URETERAL RE-IMPLANTATION;  Surgeon: Ceasar Mons, MD;  Location: [facility];  Service: Urology;  Laterality: Right;  ONLY NEEDS 240 MIN TOTAL FOR ALL PROCEDURES  . TUBAL LIGATION    . WISDOM TOOTH EXTRACTION      There were no vitals filed for this visit.   Subjective Assessment - 03/11/20 1006    Subjective  Back surgery RT foot numb and RT leg numb, LT arm numb. MD thinks may come from neck.  No MRI until post PT.  No pain or issues with neck    Pertinent History  lumbar fusion 12/2019    Limitations  Walking    Diagnostic tests  MRI negative.    Patient Stated Goals  decrease pain    Currently in Pain?  No/denies    Pain Location  Neck         [facility] PT Assessment - 03/11/20 0001      Assessment   Medical Diagnosis  C6 cervical radiculopathy    Referring Provider (PT)  Kary Kos, MD    Onset Date/Surgical Date  --   post lumbar surgery   Hand Dominance  Right    Prior Therapy  for knee and for back pre back surgery      Precautions   Precautions  Fall    Precaution Comments  using RW and numbness of LE      Restrictions   Weight Bearing Restrictions  No      Balance Screen   Has the patient fallen in the past 6 months  No      Prior Function   Vocation Requirements  not working      Cognition   Overall Cognitive Status  Within Functional Limits for tasks assessed      AROM   AROM Assessment Site   Cervical    Cervical Flexion  50    Cervical Extension  50    Cervical - Right Side Bend  30    Cervical - Left Side Bend  30   twinge on LT   Cervical - Right Rotation  63    Cervical - Left Rotation  70      Strength   Overall Strength Comments  Normal UE strength                Objective measurements completed on examination: See above findings.              PT Education - 03/11/20 1053  Education Details  cervical ROM , POC    Person(s) Educated  Patient    Methods  Explanation;Demonstration;Verbal cues;Handout;Tactile cues    Comprehension  Returned demonstration;Verbalized understanding       PT Short Term Goals - 03/11/20 1047      PT SHORT TERM GOAL #1   Title  She will be independent with all HEP issued    Baseline  no program for neck    Time  3    Period  Weeks    Status  New      PT SHORT TERM GOAL #2   Title  She will report decreased UE symproms  by 25%    Baseline  numbness in RT hand and LT upper arm post back surgery    Time  3    Period  Weeks    Status  New        PT Long Term Goals - 11/23/19 1014      PT LONG TERM GOAL #1   Title  To be set at ERO PRN             Plan - 03/11/20 1012    Clinical Impression Statement  Ms Crocket presens with symptoms of numbness in portions of all extremities post disectomy on RT. She did not have these synmptoms prior to [place].  Her neck was assessed and it did not appear the neck was casuing any of the extremitiy symptoms. She had some mild stiffness in her neck and some mild neck pain in LT neck with movement to the LT. She has to be verbally encouraged to max effort with RT hand on MMT but not pain or limits with either UE.    We will treat her for 3 saessions for ROM and upper 1/4 strength  and some manual to see if this impacts her symptomsupper    Personal Factors and Comorbidities  Time since onset of injury/illness/exacerbation;Comorbidity 1;Comorbidity 2;Comorbidity 3+     Comorbidities  chronic pain, anxiety, GERD, HTN, abdominal lipoma, lumar disectomy    Examination-Activity Limitations  Bathing;Locomotion Level;Sit;Sleep;Stairs;Stand    Examination-Participation Restrictions  Meal Prep;Community Activity;Shop;Laundry    Stability/Clinical Decision Making  Evolving/Moderate complexity    Clinical Decision Making  Moderate    Rehab Potential  Good    PT Frequency  --   3 visits   PT Duration  3 weeks    PT Treatment/Interventions  ADLs/Self Care Home Management;Cryotherapy;Electrical Stimulation;Iontophoresis 4mg /ml Dexamethasone;Gait training;Functional mobility training;Traction;Ultrasound;Moist Heat;Therapeutic activities;Therapeutic exercise;Neuromuscular re-education;Patient/family education;Passive range of motion;Manual techniques;Dry needling;Taping;Spinal Manipulations;Joint Manipulations    PT Next Visit Plan  Review stretches,  add some bandsexercise , manual traction/mobs if needed ( not able to lye for mechanical traction.)    PT Home Exercise Plan  SKTC, LTR, piriformis stretch, abdominal engagement/post pelvic tilt                   Cervical rotation and side bend and levator all to RT x 2-3 reps every 2 hours  10-20 sec hold.    Consulted and Agree with Plan of Care  Patient       Patient will benefit from skilled therapeutic intervention in order to improve the following deficits and impairments:  Decreased range of motion, Difficulty walking, Increased muscle spasms, Decreased activity tolerance, Pain, Improper body mechanics, Impaired flexibility, Decreased strength, Postural dysfunction  Visit Diagnosis: Numbness of both lower extremities  Numbness and tingling of both upper extremities  Difficulty in walking, not elsewhere classified  Status post lumbar discectomy     Problem List Patient Active Problem List   Diagnosis Date Noted  . HNP (herniated nucleus pulposus), lumbar 01/23/2020  . Acute medial meniscus tear of right knee  06/09/2019  . DM type 2 with diabetic peripheral neuropathy ([place])   . Acute pain of right knee 04/20/2019  . 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 Hyperlipidemia 01/20/2018  . Tobacco use disorder 01/19/2018  . Anxiety state 01/17/2018  . Abdominal pain, RLQ 10/03/2017  . Postoperative state 09/28/2017  . Diabetic neuropathy ([place]) 06/21/2017  . Anemia 06/20/2017  . Diabetes type 2, controlled ([place]) 05/16/2017  . H/O: C-section 05/16/2017  . Vitamin B12 deficiency 05/16/2017  . Health care maintenance 10/18/2016  . Psoriasis 10/01/2013  . Hypertension 09/26/2012  . Obesity 09/26/2012  . Subcutaneous nodule 09/26/2012  . History of abnormal Pap smear 09/26/2012    Darrel Hoover  PT 03/11/2020, 11:54 AM  [facility] [address] [place], [place], [address] Phone: [phone]   Fax:  [phone]  Name: [name] MRN: [id number] Date of Birth: 01-Jul-1981

## 2020-03-14 DIAGNOSIS — R87622 Low grade squamous intraepithelial lesion on cytologic smear of vagina (LGSIL): Secondary | ICD-10-CM | POA: Diagnosis not present

## 2020-03-24 ENCOUNTER — Telehealth: Payer: Self-pay

## 2020-03-24 DIAGNOSIS — F4310 Post-traumatic stress disorder, unspecified: Secondary | ICD-10-CM | POA: Diagnosis not present

## 2020-03-24 NOTE — Telephone Encounter (Signed)
Cathy, case Freight forwarder, calls nurse line to report increased numbness in extremities after recent back surgery. Tye Maryland also stated the patient has not been checking her blood sugars, as she does not have supplies. Tye Maryland advised to have patient schedule a FU apt with PCP at her convenience. Will forward to PCP for diabetic supplies.

## 2020-03-25 ENCOUNTER — Ambulatory Visit: Payer: Medicaid Other

## 2020-03-25 ENCOUNTER — Other Ambulatory Visit: Payer: Self-pay

## 2020-03-25 ENCOUNTER — Other Ambulatory Visit: Payer: Self-pay | Admitting: Family Medicine

## 2020-03-25 DIAGNOSIS — R200 Anesthesia of skin: Secondary | ICD-10-CM | POA: Diagnosis not present

## 2020-03-25 DIAGNOSIS — Z9889 Other specified postprocedural states: Secondary | ICD-10-CM | POA: Diagnosis not present

## 2020-03-25 DIAGNOSIS — R202 Paresthesia of skin: Secondary | ICD-10-CM

## 2020-03-25 DIAGNOSIS — R262 Difficulty in walking, not elsewhere classified: Secondary | ICD-10-CM

## 2020-03-25 NOTE — Therapy (Signed)
[place], [place], [address] Phone: [phone]   Fax:  [phone]  Physical Therapy Treatment  Patient Details  Name: [name] MRN: [id number] Date of Birth: September 01, 1981 Referring Provider (PT): Kary Kos, MD   Encounter Date: 03/25/2020  PT End of Session - 03/25/20 0923    Visit Number  2    Number of Visits  4    Date for PT Re-Evaluation  04/04/20    Authorization Type  MCD- auth submitted 05/11//21    Authorization Time Period  5/21 to 04/10/20    Authorization - Visit Number  1    Authorization - Number of Visits  3    PT Start Time  0915    PT Stop Time  [id number]    PT Time Calculation (min)  39 min    Activity Tolerance  Patient tolerated treatment well;No increased pain    Behavior During Therapy  WFL for tasks assessed/performed       Past Medical History:  Diagnosis Date  . Abdominal lipoma 05/2015  . Abdominal pain, RLQ 10/03/2017  . Acute medial meniscus tear of right knee 06/09/2019  . Anxiety   . Arthritis    lower back  . DM type 2 with diabetic peripheral neuropathy (HCC)    Borderline  . GERD (gastroesophageal reflux disease)   . Headache    Migraines  . History of MRSA infection ~ 2006   leg  . History of shingles   . Hx of hydronephrosis    following total hysterectomy surgery ; Right kidney ; see CT abdomen 10-02-17 epic; states " a plug had got damaged during the surgery so thats why they put the stent in"   . Hypertension    under control with med., has been on med. x 6 mos.  . Lumbosacral spondylolysis    see MRI 06-01-17 epic   . Mood swing   . Optic nerve swelling    seen by Dr Mora Bellman 09-07-17, did lumbar puncture 09-19-17; patient states " they never called me about results ut i have an appt with him in april"; patient also states  " my vision is still blurry and worse when i get the migraines, they think the migraines are causing it"  . Psoriasis     Past  Surgical History:  Procedure Laterality Date  . CESAREAN SECTION  06/21/2008; 08/18/2010  . CESAREAN SECTION N/A 07/10/2014   Procedure: REPEAT CESAREAN SECTION;  Surgeon: Daria Pastures, MD;  Location: [facility];  Service: Obstetrics;  Laterality: N/A;  . CHOLECYSTECTOMY  07/10/2008  . CYSTOSCOPY N/A 09/28/2017   Procedure: CYSTOSCOPY;  Surgeon: Bobbye Charleston, MD;  Location: [facility];  Service: Gynecology;  Laterality: N/A;  . CYSTOSCOPY Right 01/18/2018   Procedure: CYSTOSCOPY, RETROGRADE / PYELOGRAM, URETEROSCOPY;  Surgeon: Ceasar Mons, MD;  Location: [facility];  Service: Urology;  Laterality: Right;  . CYSTOSCOPY W/ URETERAL STENT PLACEMENT Right 10/03/2017   Procedure: CYSTOSCOPY WITH RETROGRADE PYELOGRAM/URETERAL STENT PLACEMENT;  Surgeon: Ceasar Mons, MD;  Location: [facility];  Service: Urology;  Laterality: Right;  . CYSTOSCOPY W/ URETERAL STENT PLACEMENT Right 02/22/2018   Procedure: CYSTOSCOPY WITH URETERAL STENT PLACEMENT;  Surgeon: Ceasar Mons, MD;  [facility];  Service: Urology;  Laterality: Right;  . KNEE ARTHROSCOPY WITH MEDIAL MENISECTOMY Right 06/26/2019   Procedure: KNEE ARTHROSCOPY WITH PARTIAL MEDIAL MENISECTOMY, REMOVAL LOOSE BODY;  Surgeon: Elsie Saas, MD;  Location: [facility];  Service: Orthopedics;  Laterality: Right;  . LIPOMA EXCISION  2014   back  . LIPOMA EXCISION N/A 05/21/2015   Procedure: EXCISION LIPOMAS ON ABDOMEN;  Surgeon: Erroll Luna, MD;  Location: [facility];  Service: General;  Laterality: N/A;  . LUMBAR LAMINECTOMY/DECOMPRESSION MICRODISCECTOMY Right 01/23/2020   Procedure: Microdiscectomy right - Lumbar five-Sacral one;  Surgeon: Kary Kos, MD;  Location: [facility];  Service: Neurosurgery;  Laterality: Right;  . LUMBAR PUNCTURE  09/19/17   [facility], Dr. Curt Bears  . OOPHORECTOMY Left 09/28/2017   Procedure: OOPHORECTOMY;  Surgeon: Bobbye Charleston,  MD;  Location: [facility];  Service: Gynecology;  Laterality: Left;  . ROBOTIC ASSISTED LAPAROSCOPIC LYSIS OF ADHESION N/A 09/28/2017   Procedure: ROBOTIC ASSISTED LAPAROSCOPIC LYSIS OF ADHESION;  Surgeon: Bobbye Charleston, MD;  Location: [facility];  Service: Gynecology;  Laterality: N/A;  . ROBOTIC ASSISTED TOTAL HYSTERECTOMY WITH BILATERAL SALPINGO OOPHERECTOMY Bilateral 09/28/2017   Procedure: ROBOTIC ASSISTED TOTAL HYSTERECTOMY WITH BILATERAL SALPINGECTOMY;  Surgeon: Bobbye Charleston, MD;  Location: [facility];  Service: Gynecology;  Laterality: Bilateral;  . ROBOTICALLY ASSISTED LAPAROSCOPIC URETERAL RE-IMPLANTATION Right 02/22/2018   Procedure: ROBOTICALLY ASSISTED LAPAROSCOPIC URETERAL RE-IMPLANTATION;  Surgeon: Ceasar Mons, MD;  [facility];  Service: Urology;  Laterality: Right;  ONLY NEEDS 240 MIN TOTAL FOR ALL PROCEDURES  . TUBAL LIGATION    . WISDOM TOOTH EXTRACTION      There were no vitals filed for this visit.  Subjective Assessment - 03/25/20 0915    Subjective  Everything the same    Pain Score  7     Pain Orientation  Mid;Lower;Right    Pain Descriptors / Indicators  Numbness    Pain Onset  More than a month ago    Pain Frequency  Constant    Aggravating Factors   constant    Pain Relieving Factors  niothing                        [facility] Adult PT Treatment/Exercise - 03/25/20 0001      Neck Exercises: Theraband   Shoulder Extension  15 reps    Shoulder Extension Limitations  recd band    Rows  15 reps;Red    Shoulder External Rotation  15 reps;Red    Horizontal ABduction  15 reps;Red    Other Theraband Exercises  bilateral flexion with lateral pul to band      Neck Exercises: Seated   Other Seated Exercise  levator and scalene to r x 2 15 sec  RT and LT  No pain RT or LT.       Lumbar Exercises: Machines for Strengthening   Other Lumbar Machine Exercise  Arm bike x 6 min L1 forward      Lumbar Exercises: Seated   Other Seated Lumbar  Exercises  red band ER shoulder and  hor abduciton x 15 reps, then abd teension on and with overhead reach bilaterally             PT Education - 03/25/20 0954    Education Details  HEP    Person(s) Educated  Patient    Methods  Explanation;Handout;Verbal cues;Tactile cues;Demonstration    Comprehension  Verbalized understanding;Returned demonstration       PT Short Term Goals - 03/11/20 1047      PT SHORT TERM GOAL #1   Title  She will be independent with all HEP issued    Baseline  no program for  neck    Time  3    Period  Weeks    Status  New      PT SHORT TERM GOAL #2   Title  She will report decreased UE symproms  by 25%    Baseline  numbness in RT hand and LT upper arm post back surgery    Time  3    Period  Weeks    Status  New        PT Long Term Goals - 11/23/19 1014      PT LONG TERM GOAL #1   Title  To be set at ERO PRN            Plan - 03/25/20 0923    Clinical Impression Statement  tolerated all exercise without increased symptoms  but symptoms unchanged since eval  Progress HEP next session    PT Treatment/Interventions  ADLs/Self Care Home Management;Cryotherapy;Electrical Stimulation;Iontophoresis 4mg /ml Dexamethasone;Gait training;Functional mobility training;Traction;Ultrasound;Moist Heat;Therapeutic activities;Therapeutic exercise;Neuromuscular re-education;Patient/family education;Passive range of motion;Manual techniques;Dry needling;Taping;Spinal Manipulations;Joint Manipulations    PT Next Visit Plan  Review stretches,  add some bandsexercise , manual traction/mobs if needed ( not able to lye for mechanical traction.)    PT Home Exercise Plan  SKTC, LTR, piriformis stretch, abdominal engagement/post pelvic tilt                   Cervical rotation and side bend and levator all to RT x 2-3 reps every 2 hours  10-20 sec hold.  RT/LT   band exercises hor abduction , extension , ER , Flexion    Consulted and Agree with Plan of Care  Patient        Patient will benefit from skilled therapeutic intervention in order to improve the following deficits and impairments:  Decreased range of motion, Difficulty walking, Increased muscle spasms, Decreased activity tolerance, Pain, Improper body mechanics, Impaired flexibility, Decreased strength, Postural dysfunction  Visit Diagnosis: Numbness of both lower extremities  Numbness and tingling of both upper extremities  Difficulty in walking, not elsewhere classified  Status post lumbar discectomy     Problem List Patient Active Problem List   Diagnosis Date Noted  . HNP (herniated nucleus pulposus), lumbar 01/23/2020  . Acute medial meniscus tear of right knee 06/09/2019  . DM type 2 with diabetic peripheral neuropathy ([place])   . Acute pain of right knee 04/20/2019  . 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 Hyperlipidemia 01/20/2018  . Tobacco use disorder 01/19/2018  . Anxiety state 01/17/2018  . Abdominal pain, RLQ 10/03/2017  . Postoperative state 09/28/2017  . Diabetic neuropathy ([place]) 06/21/2017  . Anemia 06/20/2017  . Diabetes type 2, controlled ([place]) 05/16/2017  . H/O: C-section 05/16/2017  . Vitamin B12 deficiency 05/16/2017  . Health care maintenance 10/18/2016  . Psoriasis 10/01/2013  . Hypertension 09/26/2012  . Obesity 09/26/2012  . Subcutaneous nodule 09/26/2012  . History of abnormal Pap smear 09/26/2012    Darrel Hoover  PT 03/25/2020, 9:57 AM  [facility] [address] [place], [place], [address] Phone: [phone]   Fax:  [phone]  Name: [name] MRN: [id number] Date of Birth: April 03, 1981

## 2020-03-25 NOTE — Patient Instructions (Signed)
Band exercise shoulder flexion , extension , ER , hor abduction  X 1-20 reps, hold 2-5 sec 1-2 sets bilateral Begin to neck strer=ch RT and LT

## 2020-03-27 ENCOUNTER — Other Ambulatory Visit: Payer: Self-pay

## 2020-03-27 ENCOUNTER — Ambulatory Visit: Payer: Medicaid Other

## 2020-03-27 DIAGNOSIS — R200 Anesthesia of skin: Secondary | ICD-10-CM | POA: Diagnosis not present

## 2020-03-27 DIAGNOSIS — R202 Paresthesia of skin: Secondary | ICD-10-CM | POA: Diagnosis not present

## 2020-03-27 DIAGNOSIS — R262 Difficulty in walking, not elsewhere classified: Secondary | ICD-10-CM

## 2020-03-27 DIAGNOSIS — Z9889 Other specified postprocedural states: Secondary | ICD-10-CM

## 2020-03-27 NOTE — Therapy (Signed)
[place], [place], [address] Phone: [phone]   Fax:  [phone]  Physical Therapy Treatment  Patient Details  Name: [name] MRN: [id number] Date of Birth: 05/12/1981 Referring Provider (PT): Kary Kos, MD   Encounter Date: 03/27/2020  PT End of Session - 03/27/20 0920    Visit Number  3    Number of Visits  4    Date for PT Re-Evaluation  04/04/20    Authorization Type  MCD- auth submitted 05/11//21    Authorization Time Period  5/21 to 04/10/20    Authorization - Visit Number  2    Authorization - Number of Visits  3    PT Start Time  [phone]   Had to leave early to make it to MD.   PT Stop Time  0947    PT Time Calculation (min)  31 min    Activity Tolerance  Patient tolerated treatment well;No increased pain       Past Medical History:  Diagnosis Date  . Abdominal lipoma 05/2015  . Abdominal pain, RLQ 10/03/2017  . Acute medial meniscus tear of right knee 06/09/2019  . Anxiety   . Arthritis    lower back  . DM type 2 with diabetic peripheral neuropathy (HCC)    Borderline  . GERD (gastroesophageal reflux disease)   . Headache    Migraines  . History of MRSA infection ~ 2006   leg  . History of shingles   . Hx of hydronephrosis    following total hysterectomy surgery ; Right kidney ; see CT abdomen 10-02-17 epic; states " a plug had got damaged during the surgery so thats why they put the stent in"   . Hypertension    under control with med., has been on med. x 6 mos.  . Lumbosacral spondylolysis    see MRI 06-01-17 epic   . Mood swing   . Optic nerve swelling    seen by Dr Mora Bellman 09-07-17, did lumbar puncture 09-19-17; patient states " they never called me about results ut i have an appt with him in april"; patient also states  " my vision is still blurry and worse when i get the migraines, they think the migraines are causing it"  . Psoriasis     Past Surgical History:   Procedure Laterality Date  . CESAREAN SECTION  06/21/2008; 08/18/2010  . CESAREAN SECTION N/A 07/10/2014   Procedure: REPEAT CESAREAN SECTION;  Surgeon: Daria Pastures, MD;  Location: [facility];  Service: Obstetrics;  Laterality: N/A;  . CHOLECYSTECTOMY  07/10/2008  . CYSTOSCOPY N/A 09/28/2017   Procedure: CYSTOSCOPY;  Surgeon: Bobbye Charleston, MD;  Location: [facility];  Service: Gynecology;  Laterality: N/A;  . CYSTOSCOPY Right 01/18/2018   Procedure: CYSTOSCOPY, RETROGRADE / PYELOGRAM, URETEROSCOPY;  Surgeon: Ceasar Mons, MD;  Location: [facility];  Service: Urology;  Laterality: Right;  . CYSTOSCOPY W/ URETERAL STENT PLACEMENT Right 10/03/2017   Procedure: CYSTOSCOPY WITH RETROGRADE PYELOGRAM/URETERAL STENT PLACEMENT;  Surgeon: Ceasar Mons, MD;  Location: [facility];  Service: Urology;  Laterality: Right;  . CYSTOSCOPY W/ URETERAL STENT PLACEMENT Right 02/22/2018   Procedure: CYSTOSCOPY WITH URETERAL STENT PLACEMENT;  Surgeon: Ceasar Mons, MD;  Location: [facility];  Service: Urology;  Laterality: Right;  . KNEE ARTHROSCOPY WITH MEDIAL MENISECTOMY Right 06/26/2019   Procedure: KNEE ARTHROSCOPY WITH PARTIAL MEDIAL MENISECTOMY, REMOVAL LOOSE BODY;  Surgeon: Elsie Saas, MD;  Location: [facility];  Service: Orthopedics;  Laterality: Right;  . LIPOMA EXCISION  2014   back  . LIPOMA EXCISION N/A 05/21/2015   Procedure: EXCISION LIPOMAS ON ABDOMEN;  Surgeon: Erroll Luna, MD;  Location: [facility];  Service: General;  Laterality: N/A;  . LUMBAR LAMINECTOMY/DECOMPRESSION MICRODISCECTOMY Right 01/23/2020   Procedure: Microdiscectomy right - Lumbar five-Sacral one;  Surgeon: Kary Kos, MD;  Location: [facility];  Service: Neurosurgery;  Laterality: Right;  . LUMBAR PUNCTURE  09/19/17    Imaging, Dr. Curt Bears  . OOPHORECTOMY Left 09/28/2017   Procedure: OOPHORECTOMY;  Surgeon: Bobbye Charleston, MD;  Location: [facility];  Service: Gynecology;  Laterality: Left;  . ROBOTIC ASSISTED LAPAROSCOPIC LYSIS OF ADHESION N/A 09/28/2017   Procedure: ROBOTIC ASSISTED LAPAROSCOPIC LYSIS OF ADHESION;  Surgeon: Bobbye Charleston, MD;  Location: [facility];  Service: Gynecology;  Laterality: N/A;  . ROBOTIC ASSISTED TOTAL HYSTERECTOMY WITH BILATERAL SALPINGO OOPHERECTOMY Bilateral 09/28/2017   Procedure: ROBOTIC ASSISTED TOTAL HYSTERECTOMY WITH BILATERAL SALPINGECTOMY;  Surgeon: Bobbye Charleston, MD;  Location: [facility];  Service: Gynecology;  Laterality: Bilateral;  . ROBOTICALLY ASSISTED LAPAROSCOPIC URETERAL RE-IMPLANTATION Right 02/22/2018   Procedure: ROBOTICALLY ASSISTED LAPAROSCOPIC URETERAL RE-IMPLANTATION;  Surgeon: Ceasar Mons, MD;  Location: [facility];  Service: Urology;  Laterality: Right;  ONLY NEEDS 240 MIN TOTAL FOR ALL PROCEDURES  . TUBAL LIGATION    . WISDOM TOOTH EXTRACTION      There were no vitals filed for this visit.  Subjective Assessment - 03/27/20 0925    Subjective  No changes Did exercises withut incr symptoms    Pain Score  8     Pain Location  Back    Pain Orientation  Right;Left;Lower    Pain Descriptors / Indicators  Numbness;Aching    Pain Onset  More than a month ago    Pain Frequency  Constant    Aggravating Factors   activity    Pain Relieving Factors  nothing    Multiple Pain Sites  Yes    Pain Score  5    Pain Location  Shoulder    Pain Orientation  Left    Pain Descriptors / Indicators  Aching    Pain Type  Chronic pain    Pain Onset  More than a month ago    Pain Frequency  Constant    Aggravating Factors   reaching overhead    Pain Relieving Factors  rest                        [facility] Adult PT Treatment/Exercise - 03/27/20 0001      Neck Exercises: Seated   Other Seated Exercise  levator and scalene to r x 2 15 sec  RT and LT  No pain RT or LT.     Other Seated Exercise  bicep curls with  overhead press 10 reps x 2 3# then 10 reps no weight  abduction to 90 degrees       Lumbar Exercises: Machines for Strengthening   Other Lumbar Machine Exercise  Arm bike x 6 min L2 forward      Lumbar Exercises: Seated   Sit to Stand  5 reps    Other Seated Lumbar Exercises   green band ER shoulder and  hor abduciton x 15 reps, then abd teension on and with overhead reach bilaterally             PT Education - 03/27/20 0951    Education Details  discussed  wiuth momdel area and possible causes fo RT anterior deep shoulder pain    Person(s) Educated  Patient    Methods  Explanation    Comprehension  Verbalized understanding       PT Short Term Goals - 03/27/20 0953      PT SHORT TERM GOAL #1   Title  She will be independent with all HEP issued    Status  Achieved      PT SHORT TERM GOAL #2   Title  She will report decreased UE symproms  by 25%    Status  On-going        PT Long Term Goals - 11/23/19 1014      PT LONG TERM GOAL #1   Title  To be set at [facility] PRN            Plan - 03/27/20 0922    Clinical Impression Statement  No changes since last session. Sees  surgeon after this session.  LBP with exercies eased in sitting with band pulls.Lt shoulder pain is also limiting UE exercises. Asked her to ask surgeon about this    PT Treatment/Interventions  ADLs/Self Care Home Management;Cryotherapy;Electrical Stimulation;Iontophoresis 4mg /ml Dexamethasone;Gait training;Functional mobility training;Traction;Ultrasound;Moist Heat;Therapeutic activities;Therapeutic exercise;Neuromuscular re-education;Patient/family education;Passive range of motion;Manual techniques;Dry needling;Taping;Spinal Manipulations;Joint Manipulations    PT Next Visit Plan  Review stretches, , manual traction/mobs if needed ( not able to lye for mechanical traction.)    PT Home Exercise Plan  SKTC, LTR, piriformis stretch, abdominal engagement/post pelvic tilt                   Cervical rotation and side bend and levator all to RT x 2-3 reps every 2  hours  10-20 sec hold.  RT/LT   band exercises hor abduction , extension , ER , Flexion    Consulted and Agree with Plan of Care  Patient       Patient will benefit from skilled therapeutic intervention in order to improve the following deficits and impairments:  Decreased range of motion, Difficulty walking, Increased muscle spasms, Decreased activity tolerance, Pain, Improper body mechanics, Impaired flexibility, Decreased strength, Postural dysfunction  Visit Diagnosis: Numbness of both lower extremities  Numbness and tingling of both upper extremities  Difficulty in walking, not elsewhere classified  Status post lumbar discectomy     Problem List Patient Active Problem List   Diagnosis Date Noted  . HNP (herniated nucleus pulposus), lumbar 01/23/2020  . Acute medial meniscus tear of right knee 06/09/2019  . DM type 2 with diabetic peripheral neuropathy ([place])   . Acute pain of right knee 04/20/2019  . 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 Hyperlipidemia 01/20/2018  . Tobacco use disorder 01/19/2018  . Anxiety state 01/17/2018  . Abdominal pain, RLQ 10/03/2017  . Postoperative state 09/28/2017  . Diabetic neuropathy ([place]) 06/21/2017  . Anemia 06/20/2017  . Diabetes type 2, controlled ([place]) 05/16/2017  . H/O: C-section 05/16/2017  . Vitamin B12 deficiency 05/16/2017  . Health care maintenance 10/18/2016  . Psoriasis 10/01/2013  . Hypertension 09/26/2012  . Obesity 09/26/2012  . Subcutaneous nodule 09/26/2012  . History of abnormal Pap smear 09/26/2012    Darrel Hoover  PT 03/27/2020, 9:54 AM  [facility] [address] [place], [place], [address] Phone: [phone]   Fax:  [phone]  Name: [name] MRN: [id number] Date of Birth: Nov 01, 1981

## 2020-04-01 ENCOUNTER — Ambulatory Visit: Payer: Medicaid Other

## 2020-04-07 DIAGNOSIS — F4310 Post-traumatic stress disorder, unspecified: Secondary | ICD-10-CM | POA: Diagnosis not present

## 2020-04-08 DIAGNOSIS — R200 Anesthesia of skin: Secondary | ICD-10-CM | POA: Diagnosis not present

## 2020-04-08 DIAGNOSIS — M50223 Other cervical disc displacement at C6-C7 level: Secondary | ICD-10-CM | POA: Diagnosis not present

## 2020-04-08 DIAGNOSIS — M50222 Other cervical disc displacement at C5-C6 level: Secondary | ICD-10-CM | POA: Diagnosis not present

## 2020-04-10 ENCOUNTER — Ambulatory Visit: Payer: Medicaid Other | Attending: Physician Assistant

## 2020-04-10 ENCOUNTER — Telehealth: Payer: Self-pay | Admitting: Physical Therapy

## 2020-04-10 NOTE — Telephone Encounter (Signed)
Message left about today's missed appointment and asked if she would call to let us know if she wants to be discharged. Clinic number was left on the message.

## 2020-04-11 DIAGNOSIS — F4310 Post-traumatic stress disorder, unspecified: Secondary | ICD-10-CM | POA: Diagnosis not present

## 2020-04-15 ENCOUNTER — Ambulatory Visit: Payer: Medicaid Other | Admitting: Family Medicine

## 2020-04-15 ENCOUNTER — Other Ambulatory Visit: Payer: Self-pay

## 2020-04-15 VITALS — BP 159/100 | HR 98 | Ht 66.0 in | Wt 206.4 lb

## 2020-04-15 DIAGNOSIS — I10 Essential (primary) hypertension: Secondary | ICD-10-CM | POA: Diagnosis not present

## 2020-04-15 DIAGNOSIS — E785 Hyperlipidemia, unspecified: Secondary | ICD-10-CM | POA: Diagnosis not present

## 2020-04-15 DIAGNOSIS — E119 Type 2 diabetes mellitus without complications: Secondary | ICD-10-CM | POA: Diagnosis not present

## 2020-04-15 DIAGNOSIS — R4589 Other symptoms and signs involving emotional state: Secondary | ICD-10-CM

## 2020-04-15 LAB — POCT GLYCOSYLATED HEMOGLOBIN (HGB A1C): HbA1c, POC (controlled diabetic range): 7 % (ref 0.0–7.0)

## 2020-04-15 MED ORDER — ROSUVASTATIN CALCIUM 40 MG PO TABS: 40.0000 mg | ORAL_TABLET | Freq: Every day | ORAL | 3 refills | Status: DC

## 2020-04-15 MED ORDER — FLUOXETINE HCL 40 MG PO CAPS: 40.0000 mg | ORAL_CAPSULE | Freq: Every day | ORAL | 1 refills | Status: DC

## 2020-04-15 MED ORDER — FLUOXETINE HCL 20 MG PO CAPS: 20.0000 mg | ORAL_CAPSULE | Freq: Every day | ORAL | 0 refills | Status: DC

## 2020-04-15 MED ORDER — EMPAGLIFLOZIN 10 MG PO TABS: 10.0000 mg | ORAL_TABLET | Freq: Every day | ORAL | 3 refills | Status: AC

## 2020-04-15 MED ORDER — LOSARTAN POTASSIUM 25 MG PO TABS: 25.0000 mg | ORAL_TABLET | Freq: Every day | ORAL | 3 refills | Status: DC

## 2020-04-15 NOTE — Patient Instructions (Signed)
It was great to see you today! Thank you for letting me participate in your care!  Today, we discussed your blood pressure and the benefits of taking your medications. I have refilled your blood pressure medication, a medication for your cholesterol, and your diabetes. I have also restarted your Prozac.   It is important to take these medications as keeping your blood pressure normal reduces risk of heart attack and stroke.  Please return to clinic in 2 weeks.  Be well, Courtney Rutherford, DO PGY-3, [facility]

## 2020-04-15 NOTE — Progress Notes (Signed)
    SUBJECTIVE:   CHIEF COMPLAINT / HPI:   HTN Patient has not been compliant with medications since her back surgery in March. She states she is having a difficulty time at home with her father in law at home and her surgery has not made her back pain any better. She is also now having some tingling and numbness in her legs and feet. She has simply not wanted to take any of her medications.   T2DM Patient has also not been taking her medications to control her diabetes and not exercising due to pain after surgery. She has not made any effort to change her diet yet her A1c is improved. She states she does not know why it is better. She states since surgery overall she has been eating less and more stressed.  Depression Patient mood seems down and when asked if she feels depressed she responds with a shrug. She endorses being stressed and frustrated her surgery did not relieve her pain and now is applying for disability as she has bilateral leg and foot numbness and tingling which she says she did not have before. She has not been taking her antidepressant medication since the surgery either. She denies any SI or HI and states she has not stopped taking her medication to intentionally hurt herself. She has been feeling overwhelmed since the surgery.  HLD Has been non-compliant with statin. Patient agrees to restart today.   PERTINENT  PMH / PSH: Depression, Chronic Low Back Pain, HTN, T2DM, HLD  OBJECTIVE:   BP (!) 159/100   Pulse 98   Ht 5\' 6"  (1.676 m)   Wt 206 lb 6.4 oz (93.6 kg)   LMP 09/11/2017 (Approximate)   BMI 33.31 kg/m   Gen: NAD Cardiac: RRR, no murmurs Resp: CTAB, no wheezing, no crackles Psych: Depressed mood, somber appearance  ASSESSMENT/PLAN:   Hypertension Chronic, not currently controlled due to non-compliance.  - Patient willing to restart Losartan today, refill given - Will need BMP at next visit  Diabetes type 2, controlled ([place]) Chronic, still not to  goal but slightly improved from last visit. I suspect her eating has overall decreased due to the stress of her recent surgery and her home living conditions. I did discuss with her that while it has helped her A1c this was not a healthy or advisable way to manage her diabetes.  - Restart Jardiance 10mg  daily - Recheck A1c in 3 months, may need to start an GLP-1 if still not to goal.  Hyperlipidemia Patient has been non-compliant with Crestor but agrees to restart today - Crestor 20mg  daily, recheck lipid panel in 6 months  Depressed mood Patient with history of depression and anxiety and stopped taking Prozac. She agrees to restart today. - Restart Prozac 40mg  dose and follow up in one week.     Nuala Alpha, [place]

## 2020-04-15 NOTE — Progress Notes (Signed)
PhQ2 and 9.  Provider aware.  Ozella Almond, [place]

## 2020-04-17 DIAGNOSIS — R4589 Other symptoms and signs involving emotional state: Secondary | ICD-10-CM | POA: Insufficient documentation

## 2020-04-17 NOTE — Assessment & Plan Note (Signed)
Chronic, not currently controlled due to non-compliance.  - Patient willing to restart Losartan today, refill given - Will need BMP at next visit

## 2020-04-17 NOTE — Assessment & Plan Note (Signed)
Patient has been non-compliant with Crestor but agrees to restart today - Crestor 20mg  daily, recheck lipid panel in 6 months

## 2020-04-17 NOTE — Assessment & Plan Note (Addendum)
Patient with history of depression and anxiety and stopped taking Prozac. She agrees to restart today. - Restart Prozac 40mg  dose and follow up in one week.

## 2020-04-17 NOTE — Assessment & Plan Note (Signed)
Chronic, still not to goal but slightly improved from last visit. I suspect her eating has overall decreased due to the stress of her recent surgery and her home living conditions. I did discuss with her that while it has helped her A1c this was not a healthy or advisable way to manage her diabetes.  - Restart Jardiance 10mg  daily - Recheck A1c in 3 months, may need to start an GLP-1 if still not to goal.

## 2020-04-21 DIAGNOSIS — F4310 Post-traumatic stress disorder, unspecified: Secondary | ICD-10-CM | POA: Diagnosis not present

## 2020-04-22 ENCOUNTER — Encounter: Payer: Self-pay | Admitting: Diagnostic Neuroimaging

## 2020-04-22 ENCOUNTER — Ambulatory Visit: Payer: Medicaid Other | Admitting: Diagnostic Neuroimaging

## 2020-04-22 VITALS — BP 140/98 | HR 92 | Ht 66.0 in | Wt 202.6 lb

## 2020-04-22 DIAGNOSIS — E538 Deficiency of other specified B group vitamins: Secondary | ICD-10-CM

## 2020-04-22 NOTE — Patient Instructions (Signed)
DIABETIC NEUROPATHY + B12 DEFICIENCY NEUROPATHY - continue diabetes control - check B12 level - start alpha-lipoic acid 600mg  daily  MIGRAINE HEADACHES (improved) - continue sumatriptan

## 2020-04-22 NOTE — Progress Notes (Signed)
[facility]  PATIENT: [name] DOB: 27-Aug-1981  REFERRING CLINICIAN: Synetta Shadow, MD HISTORY FROM: patient  REASON FOR VISIT: follow up    HISTORICAL  CHIEF COMPLAINT:  Chief Complaint  Patient presents with  . Neuropathy    rm 6  "after back surgery March 2021 my right foot went numb, now it's my right leg, both hands and feet, left shoulder to elbow that are numb"    HISTORY OF PRESENT ILLNESS:   UPDATE (04/22/20, VRP): Since last visit, HA are improved (on sumatriptan). Also had L5-S1 surgery in March 2021 --> post-op had right foot numbness, then left foot, then arms and hands. Foot numbness in worsens with standing. Intermittent low back pain.   UPDATE (09/07/17, VRP): Since last visit, doing about the same. Pain in feet / legs is stable (pins and needles, burning). Tolerating gabapentin 800mg  three times a day. No alleviating or aggravating factors.   Also reports 3 years of headaches, blurred vision, ringing in ears. Starting in ~2015, patient has had headaches (frontal, pressure, nausea, photo/phonophobia; prodromal "black dots" in vision; 3-4 HA per day, every 3 days, lasting hours at a time). Went to [place] for diabetic eye exam in Sept 2018  --> dx'd with "swelling in optic nerves" and "decreased peripheral vision". Was recommended to come here for LP.   Continues on B12 replacement and iron replacement.   Planning to have hysterectomy in Sep 28, 2017.   Now on zoloft 100mg  daily for mood swings.   PRIOR HPI (05/03/17): 39 year old female here for evaluation of pain, burning sensation, pins and needles sensation in her thighs and feet since March 2018. Just prior to onset of symptoms patient had been on injectable medication for psoriasis, but stopped this due to insurance reasons. Soon after patient started to have pain in legs and feet as above. Symptoms seem to spare her anterior tibial region. Patient also had some numbness and tingling  in the toes. Patient does report some low back pain, hip pain as well. Some symptoms radiate from her low back to her legs. She has some mild intermittent numbness in her arms that is migratory. No specific prodromal or triggering factors other than medication change as above. Patient has tried gabapentin 300-600 mg 3 times per day without benefit.   REVIEW OF SYSTEMS: Full 14 system review of systems performed and negative with exception of: as per HPI.  ALLERGIES: Allergies  Allergen Reactions  . Bee Venom Anaphylaxis  . Penicillins Anaphylaxis and Other (See Comments)    Has patient had a PCN reaction causing immediate rash, facial/tongue/throat swelling, SOB or lightheadedness with hypotension: Yes Has patient had a PCN reaction causing severe rash involving mucus membranes or skin necrosis: No Has patient had a PCN reaction that required hospitalization: Yes Has patient had a PCN reaction occurring within the last 10 years: No If all of the above answers are "NO", then may proceed with Cephalosporin use.   . Mushroom Extract Complex Nausea And Vomiting  . Ciprofloxacin Rash  . Powder Itching, Rash and Other (See Comments)    The powder from latex gloves    HOME MEDICATIONS: Outpatient Medications Prior to Visit  Medication Sig Dispense Refill  . acetaminophen (TYLENOL) 650 MG CR tablet Take 1,300 mg by mouth 5 (five) times daily.    . empagliflozin (JARDIANCE) 10 MG TABS tablet Take 1 tablet (10 mg total) by mouth daily. 90 tablet 3  . FLUoxetine (PROZAC) 20 MG capsule Take  1 capsule (20 mg total) by mouth daily. 14 capsule 0  . losartan (COZAAR) 25 MG tablet Take 1 tablet (25 mg total) by mouth at bedtime. 90 tablet 3  . rosuvastatin (CRESTOR) 40 MG tablet Take 1 tablet (40 mg total) by mouth daily. 90 tablet 3  . SUMAtriptan (IMITREX) 50 MG tablet Take 50 mg by mouth every 2 (two) hours as needed for migraine. May repeat in 2 hours if headache persists or recurs.    Derrill Memo ON  04/30/2020] FLUoxetine (PROZAC) 40 MG capsule Take 1 capsule (40 mg total) by mouth daily. (Patient not taking: Reported on 04/22/2020) 30 capsule 1  . methocarbamol (ROBAXIN) 500 MG tablet Take 1 tablet (500 mg total) by mouth 4 (four) times daily. (Patient not taking: Reported on 03/11/2020) 45 tablet 0  . oxyCODONE-acetaminophen (PERCOCET) 5-325 MG tablet Take 1 tablet by mouth every 4 (four) hours as needed for severe pain. (Patient not taking: Reported on 03/11/2020) 20 tablet 0  . propranolol (INDERAL) 40 MG tablet TAKE 1 TABLET BY MOUTH EVERY DAY (Patient not taking: Reported on 04/22/2020) 45 tablet 0   No facility-administered medications prior to visit.    PAST MEDICAL HISTORY: Past Medical History:  Diagnosis Date  . Abdominal lipoma 05/2015  . Abdominal pain, RLQ 10/03/2017  . Acute medial meniscus tear of right knee 06/09/2019  . Anxiety   . Arthritis    lower back  . DM type 2 with diabetic peripheral neuropathy (HCC)    Borderline  . GERD (gastroesophageal reflux disease)   . Headache    Migraines  . History of MRSA infection ~ 2006   leg  . History of shingles   . Hx of hydronephrosis    following total hysterectomy surgery ; Right kidney ; see CT abdomen 10-02-17 epic; states " a plug had got damaged during the surgery so thats why they put the stent in"   . Hypertension    under control with med., has been on med. x 6 mos.  . Lumbosacral spondylolysis    see MRI 06-01-17 epic   . Mood swing   . Optic nerve swelling    seen by Dr Mora Bellman 09-07-17, did lumbar puncture 09-19-17; patient states " they never called me about results ut i have an appt with him in april"; patient also states  " my vision is still blurry and worse when i get the migraines, they think the migraines are causing it"  . Psoriasis     PAST SURGICAL HISTORY: Past Surgical History:  Procedure Laterality Date  . CESAREAN SECTION  06/21/2008; 08/18/2010  . CESAREAN SECTION N/A 07/10/2014   Procedure:  REPEAT CESAREAN SECTION;  Surgeon: Daria Pastures, MD;  Location: [facility];  Service: Obstetrics;  Laterality: N/A;  . CHOLECYSTECTOMY  07/10/2008  . CYSTOSCOPY N/A 09/28/2017   Procedure: CYSTOSCOPY;  Surgeon: Bobbye Charleston, MD;  Location: [facility];  Service: Gynecology;  Laterality: N/A;  . CYSTOSCOPY Right 01/18/2018   Procedure: CYSTOSCOPY, RETROGRADE / PYELOGRAM, URETEROSCOPY;  Surgeon: Ceasar Mons, MD;  Location: [facility];  Service: Urology;  Laterality: Right;  . CYSTOSCOPY W/ URETERAL STENT PLACEMENT Right 10/03/2017   Procedure: CYSTOSCOPY WITH RETROGRADE PYELOGRAM/URETERAL STENT PLACEMENT;  Surgeon: Ceasar Mons, MD;  Location: [facility];  Service: Urology;  Laterality: Right;  . CYSTOSCOPY W/ URETERAL STENT PLACEMENT Right 02/22/2018   Procedure: CYSTOSCOPY WITH URETERAL STENT PLACEMENT;  Surgeon: Ceasar Mons, MD;  Location: [facility];  Service: Urology;  Laterality: Right;  . KNEE ARTHROSCOPY WITH MEDIAL MENISECTOMY Right 06/26/2019   Procedure: KNEE ARTHROSCOPY WITH PARTIAL MEDIAL MENISECTOMY, REMOVAL LOOSE BODY;  Surgeon: Elsie Saas, MD;  Location: [facility];  Service: Orthopedics;  Laterality: Right;  . LIPOMA EXCISION  2014   back  . LIPOMA EXCISION N/A 05/21/2015   Procedure: EXCISION LIPOMAS ON ABDOMEN;  Surgeon: Erroll Luna, MD;  Location: [facility];  Service: General;  Laterality: N/A;  . LUMBAR LAMINECTOMY/DECOMPRESSION MICRODISCECTOMY Right 01/23/2020   Procedure: Microdiscectomy right - Lumbar five-Sacral one;  Surgeon: Kary Kos, MD;  Location: [facility];  Service: Neurosurgery;  Laterality: Right;  . LUMBAR PUNCTURE  09/19/17   [facility], Dr. Curt Bears  . OOPHORECTOMY Left 09/28/2017   Procedure: OOPHORECTOMY;  Surgeon: Bobbye Charleston, MD;  Location: [facility];  Service: Gynecology;  Laterality: Left;  . ROBOTIC ASSISTED LAPAROSCOPIC LYSIS OF ADHESION N/A 09/28/2017    Procedure: ROBOTIC ASSISTED LAPAROSCOPIC LYSIS OF ADHESION;  Surgeon: Bobbye Charleston, MD;  Location: [facility];  Service: Gynecology;  Laterality: N/A;  . ROBOTIC ASSISTED TOTAL HYSTERECTOMY WITH BILATERAL SALPINGO OOPHERECTOMY Bilateral 09/28/2017   Procedure: ROBOTIC ASSISTED TOTAL HYSTERECTOMY WITH BILATERAL SALPINGECTOMY;  Surgeon: Bobbye Charleston, MD;  Location: [facility];  Service: Gynecology;  Laterality: Bilateral;  . ROBOTICALLY ASSISTED LAPAROSCOPIC URETERAL RE-IMPLANTATION Right 02/22/2018   Procedure: ROBOTICALLY ASSISTED LAPAROSCOPIC URETERAL RE-IMPLANTATION;  Surgeon: Ceasar Mons, MD;  Location: [facility];  Service: Urology;  Laterality: Right;  ONLY NEEDS 240 MIN TOTAL FOR ALL PROCEDURES  . TUBAL LIGATION    . WISDOM TOOTH EXTRACTION      FAMILY HISTORY: Family History  Problem Relation Age of Onset  . Drug abuse Mother   . Heart disease Maternal Grandmother   . Heart disease Maternal Grandfather     SOCIAL HISTORY:  Social History   Socioeconomic History  . Marital status: Married    Spouse name: Not on file  . Number of children: Not on file  . Years of education: Not on file  . Highest education level: Not on file  Occupational History  . Not on file  Tobacco Use  . Smoking status: Current Some Day Smoker    Packs/day: 0.50    Years: 16.00    Pack years: 8.00    Types: Cigarettes    Start date: 05/01/2014  . Smokeless tobacco: Never Used  Vaping Use  . Vaping Use: Never used  Substance and Sexual Activity  . Alcohol use: No    Alcohol/week: 0.0 standard drinks  . Drug use: No  . Sexual activity: Yes    Birth control/protection: None  Other Topics Concern  . Not on file  Social History Narrative   Live home with husband and 3 kids.   Education HS grad.  Culinary grad.  Caffeine  8 glasses (sweet tea), some soda's occ.  Working: [employer].     Social Determinants of Health   Financial Resource Strain:   . Difficulty of Paying Living Expenses:    Food Insecurity:   . Worried About Charity fundraiser in the Last Year:   . Arboriculturist in the Last Year:   Transportation Needs:   . Film/video editor (Medical):   Marland Kitchen Lack of Transportation (Non-Medical):   Physical Activity:   . Days of Exercise per Week:   . Minutes of Exercise per Session:   Stress:   . Feeling of Stress :   Social Connections:   . Frequency of  Communication with Friends and Family:   . Frequency of Social Gatherings with Friends and Family:   . Attends Religious Services:   . Active Member of Clubs or Organizations:   . Attends Archivist Meetings:   Marland Kitchen Marital Status:   Intimate Partner Violence:   . Fear of Current or Ex-Partner:   . Emotionally Abused:   Marland Kitchen Physically Abused:   . Sexually Abused:      PHYSICAL EXAM  GENERAL EXAM/CONSTITUTIONAL: Vitals:  Vitals:   04/22/20 1006  BP: (!) 140/98  Pulse: 92  Weight: 202 lb 9.6 oz (91.9 kg)  Height: 5\' 6"  (1.676 m)   Body mass index is 32.7 kg/m. No exam data present  Patient is in no distress; well developed, nourished and groomed; neck is supple  CARDIOVASCULAR:  Examination of carotid arteries is normal; no carotid bruits  Regular rate and rhythm, no murmurs  Examination of peripheral vascular system by observation and palpation is normal  EYES:  Ophthalmoscopic exam of optic discs and posterior segments is NOTABLE FOR MILD BLURRED DISC MARGINS  MUSCULOSKELETAL:  Gait, strength, tone, movements noted in Neurologic exam below  NEUROLOGIC: MENTAL STATUS:  No flowsheet data found.  awake, alert, oriented to person, place and time  recent and remote memory intact  normal attention and concentration  language fluent, comprehension intact, naming intact,   fund of knowledge appropriate  CRANIAL NERVE:   2nd - MILD BLURRED DISC MARGINS  2nd, 3rd, 4th, 6th - pupils equal and reactive to light, visual fields full to confrontation, extraocular muscles intact,  no nystagmus  5th - facial sensation symmetric  7th - facial strength symmetric  8th - hearing intact  9th - palate elevates symmetrically, uvula midline  11th - shoulder shrug symmetric  12th - tongue protrusion midline  MOTOR:   normal bulk and tone; 5/5 IN BUE AND BLE  SENSORY:   normal and symmetric to light touch  DECR TO TEMP AND VIB IN FEET/ANKLE  COORDINATION:   finger-nose-finger, fine finger movements normal  REFLEXES:   deep tendon reflexes --> BUE 2, KNEES 1, ANKLES TRACE  GAIT/STATION:   MILD ANTALGIC GAIT; USING WALKER    DIAGNOSTIC DATA (LABS, IMAGING, TESTING) - I reviewed patient records, labs, notes, testing and imaging myself where available.  Lab Results  Component Value Date   WBC 10.9 (H) 01/17/2020   HGB 14.9 01/17/2020   HCT 45.4 01/17/2020   MCV 88.8 01/17/2020   PLT 328 01/17/2020      Component Value Date/Time   NA 140 01/17/2020 1355   NA 141 01/08/2020 1435   K 4.1 01/17/2020 1355   CL 105 01/17/2020 1355   CO2 23 01/17/2020 1355   GLUCOSE 118 (H) 01/17/2020 1355   BUN 10 01/17/2020 1355   BUN 9 01/08/2020 1435   CREATININE 0.61 01/17/2020 1355   CREATININE 0.60 10/18/2016 1123   CALCIUM 9.9 01/17/2020 1355   PROT 7.6 01/21/2018 1233   PROT 7.2 05/03/2017 0947   ALBUMIN 3.8 01/21/2018 1233   AST 16 01/21/2018 1233   ALT 15 01/21/2018 1233   ALKPHOS 64 01/21/2018 1233   BILITOT 0.9 01/21/2018 1233   GFRNONAA >60 01/17/2020 1355   GFRNONAA >89 10/18/2016 1123   GFRAA >60 01/17/2020 1355   GFRAA >89 10/18/2016 1123   Lab Results  Component Value Date   CHOL 250 (H) 12/24/2019   HDL 38 (L) 12/24/2019   LDLCALC 143 (H) 12/24/2019   TRIG 374 (H) 12/24/2019  CHOLHDL 6.6 (H) 12/24/2019   Lab Results  Component Value Date   HGBA1C 7.0 04/15/2020   Lab Results  Component Value Date   VITAMINB12 <150 (L) 05/03/2017   Lab Results  Component Value Date   TSH 1.710 05/03/2017     07/08/15 xray lumbar  -  Negative lumbar spine.  05/31/17 Unremarkable MRI scan of the brain without contrast. No significant change compared with MRI 10/08/13  05/31/17 Abnormal MRI scan lumbar spine showing mild spondylitic changes mostly at L4-5 and L5-S1 with prominent facet hypertrophy and bilateral mild foraminal narrowing without definite encroachment on the nerve roots.  09/19/17 lumbar puncture - Lumbar puncture was performed at the left L3-4 level using a 20 gauge needle with return of clear CSF with an opening pressure of 20 cm water. 8.103ml of CSF were obtained for laboratory studies. Closing pressure was 13 cm water.      ASSESSMENT AND PLAN  39 y.o. year old female here with numbness, tingling, pain sensation in upper and lower extremities, likely related to diabetic neuropathy and b12 deficiency neuropathy. Also, psoriasis can been associated with peripheral neuropathy.  Dx:   1. Vitamin B12 deficiency      PLAN:  DIABETIC NEUROPATHY + B12 DEFICIENCY NEUROPATHY - continue diabetes control - check B12 level - start alpha-lipoic acid 600mg  daily  MIGRAINE HEADACHES (improved) - continue sumatriptan  Orders Placed This Encounter  Procedures  . Vitamin B12   Return for pending if symptoms worsen or fail to improve, return to PCP.    Penni Bombard, MD 12/11/4707, 62:83 AM Certified in Neurology, Neurophysiology and Neuroimaging  [facility] [address], [place], [place] [address] [phone]

## 2020-04-23 LAB — VITAMIN B12: Vitamin B-12: 205 pg/mL — ABNORMAL LOW (ref 232–1245)

## 2020-04-28 ENCOUNTER — Telehealth: Payer: Self-pay | Admitting: *Deleted

## 2020-04-28 NOTE — Telephone Encounter (Signed)
Spoke with patient and informed B12 still low; start B12 1000 mcg daily; follow up with PCP for further eval and replacement. Patient verbalized understanding, appreciation.

## 2020-04-29 ENCOUNTER — Ambulatory Visit: Payer: Medicaid Other | Admitting: Family Medicine

## 2020-05-01 DIAGNOSIS — Z6832 Body mass index (BMI) 32.0-32.9, adult: Secondary | ICD-10-CM | POA: Diagnosis not present

## 2020-05-01 DIAGNOSIS — G629 Polyneuropathy, unspecified: Secondary | ICD-10-CM | POA: Diagnosis not present

## 2020-05-01 DIAGNOSIS — I10 Essential (primary) hypertension: Secondary | ICD-10-CM | POA: Diagnosis not present

## 2020-05-07 ENCOUNTER — Other Ambulatory Visit: Payer: Self-pay

## 2020-05-07 ENCOUNTER — Ambulatory Visit (INDEPENDENT_AMBULATORY_CARE_PROVIDER_SITE_OTHER): Payer: Medicaid Other | Admitting: Family Medicine

## 2020-05-07 VITALS — BP 142/80 | HR 77 | Ht 66.0 in | Wt 202.0 lb

## 2020-05-07 DIAGNOSIS — I10 Essential (primary) hypertension: Secondary | ICD-10-CM

## 2020-05-07 DIAGNOSIS — E538 Deficiency of other specified B group vitamins: Secondary | ICD-10-CM | POA: Diagnosis not present

## 2020-05-07 DIAGNOSIS — E119 Type 2 diabetes mellitus without complications: Secondary | ICD-10-CM | POA: Diagnosis not present

## 2020-05-07 DIAGNOSIS — R4589 Other symptoms and signs involving emotional state: Secondary | ICD-10-CM

## 2020-05-07 MED ORDER — LOSARTAN POTASSIUM 50 MG PO TABS: 50.0000 mg | ORAL_TABLET | Freq: Every day | ORAL | 0 refills | Status: DC

## 2020-05-07 NOTE — Progress Notes (Signed)
Subjective:   Patient ID: [name]    DOB: [birth year], 39 y.o. female   MRN: [id number]  [name] is a 39 y.o. female with a history of hypertension, diabetes, diabetic neuropathy, psoriasis, obesity, vitamin B12 deficiency, anemia, hyperlipidemia, tobacco use disorder, and depression here for follow-up.  Type 2 Diabetes: Last three A1C's below. Currently on Jardiance 10mg  QD. Endorses compliance. Denies any hypoglycemia. Denies any polyuria, polydipsia, polyphagia. Intolerant to Metformin.  Lab Results  Component Value Date   HGBA1C 7.0 04/15/2020   HGBA1C 7.4 (H) 01/23/2020   HGBA1C 7.0 12/24/2019    HTN:  BP: (!) 142/80 today, Repeat 142/80. Currently on Losartan 25mg  QD. Endorses compliance. Current everyday smoker. Denies any chest pain, SOB, vision changes, or headaches.   Depression: Patient has history of depression and anxiety. She had previously stopped taking her Prozac. Home life has been more stressful in setting of her recent back surgery and her father in law. She restarted her prozac 40mg  QD. Today she notes that she is taking her Prozac daily. Denies any SI/HI. She endorses difficulty sleeping at night which she attributes to her back pain.   Vitamin B12 Deficiency: Most recent level was noted to be 205. She was started on 1000mg  of B12 PO. She notes that she is not taking this medication. History of Vitamin B12 deficiency in past. Negative Intrinsic factor Ab and anti-parietal antibody in 2018.   Review of Systems:  Per HPI.   Objective:   BP (!) 142/80   Pulse 77   Ht 5\' 6"  (1.676 m)   Wt 202 lb (91.6 kg)   LMP 09/11/2017 (Approximate)   SpO2 98%   BMI 32.60 kg/m  Vitals and nursing note reviewed.  General: pleasant middle aged lady, sitting in exam chair, well nourished, well developed, in no acute distress with non-toxic appearance HEENT: normocephalic, atraumatic, moist mucous membranes, oropharynx without erythema or exudate, TM  normal bilaterally Neck: supple, normal ROM CV: regular rate and rhythm without murmurs, rubs, or gallops, 2+ radial pulses bilaterally Lungs: clear to auscultation bilaterally with normal work of breathing Abdomen: soft, non-tender, non-distended Skin: warm, dry Extremities: warm and well perfused MSK:  gait normal, decreased strength on right compared to left of UE and LE  Neuro: Alert and oriented, speech normal  Depression screen [facility] 2/9 05/07/2020 04/15/2020 04/15/2020  Decreased Interest 1 2 3   Down, Depressed, Hopeless 1 2 2   PHQ - 2 Score 2 4 5   Altered sleeping - 2 -  Tired, decreased energy - 2 -  Change in appetite - 3 -  Feeling bad or failure about yourself  - 0 -  Trouble concentrating - 1 -  Moving slowly or fidgety/restless - 2 -  Suicidal thoughts - 0 -  PHQ-9 Score - 14 -    Assessment & Plan:   Hypertension Increase Losartan to 50mg  QD.  Follow up 2 weeks for BP check BMP today to monitor kidney function  Diabetes type 2, controlled ([place]) Continue jardiance 10mg  QD Intolerant to Metformin. Smoker. On Statin.  Follow up in 3 months for repeat A1C Consider addition of GLP-1 if uncontrolled at that time  Vitamin B12 deficiency Chronic. Borderline low at 205. Negative Intrinsic factor Ab and anti-parietal antibody in 2018. Patient has not started supplementation.  - patient to start 1041mcg of B12 daily - RTC on 07/01/20 for repeat B12 level to evaluate for improvement  Depressed mood Stable. Tolerating Prozac 40mg  QD. - continue current  medications - Safety plan discussed with the patient. They are aware of how to contact crisis services if need be and instructed to go to the nearest emergency room if they feel they are in imminent danger of harming themselves and or others, or symptoms are of out control or unbearable.  - follow up with PCP in 3 months to ensure stability, sooner if worsening   Orders Placed This Encounter  Procedures  . Basic Metabolic  Panel  . Vitamin B12    Standing Status:   Future    Standing Expiration Date:   05/07/2021   Meds ordered this encounter  Medications  . losartan (COZAAR) 50 MG tablet    Sig: Take 1 tablet (50 mg total) by mouth at bedtime.    Dispense:  90 tablet    Refill:  0   Mina Marble, DO PGY-3, [facility] 05/07/2020 7:28 PM

## 2020-05-07 NOTE — Patient Instructions (Addendum)
Thank you for coming to see me today. It was a pleasure to see you.   Please follow up with neurology for further evaluation of your numbness and tingling. I am so sorry you are going through this.  Please start taking a Vitamin B12 supplement: 1090mcg daily. I have scheduled you for a lab visit on 07/01/20 at 10:45am to have a repeat lab to check if improved.   I have obtained a lab today to check you kidney function. I have increased your Losartan to 50mg  daily. Please be sure to take this as prescribed. Please follow up on 05/21/20 at 10:30 am for a blood pressure recheck.   We are checking some labs today, I will call you if they are abnormal will send you a MyChart message or a letter if they are normal.  If you do not hear about your labs in the next 2 weeks please let us know.  Please follow-up with PCP (Dr. Manus Rudd) in 3 months for diabetes follow up.  If you have any questions or concerns, please do not hesitate to call the office at [phone].  Take Care,  Dr. Mina Marble, DO Resident Physician [place] [phone]

## 2020-05-07 NOTE — Assessment & Plan Note (Signed)
Increase Losartan to 50mg  QD.  Follow up 2 weeks for BP check BMP today to monitor kidney function

## 2020-05-07 NOTE — Assessment & Plan Note (Addendum)
Chronic. Borderline low at 205. Negative Intrinsic factor Ab and anti-parietal antibody in 2018. Patient has not started supplementation.  - patient to start 1064mcg of B12 daily - RTC on 07/01/20 for repeat B12 level to evaluate for improvement

## 2020-05-07 NOTE — Assessment & Plan Note (Signed)
Stable. Tolerating Prozac 40mg  QD. - continue current medications - Safety plan discussed with the patient. They are aware of how to contact crisis services if need be and instructed to go to the nearest emergency room if they feel they are in imminent danger of harming themselves and or others, or symptoms are of out control or unbearable.  - follow up with PCP in 3 months to ensure stability, sooner if worsening

## 2020-05-07 NOTE — Assessment & Plan Note (Addendum)
Continue jardiance 10mg  QD Intolerant to Metformin. Smoker. On Statin.  Follow up in 3 months for repeat A1C Consider addition of GLP-1 if uncontrolled at that time

## 2020-05-08 LAB — BASIC METABOLIC PANEL
BUN/Creatinine Ratio: 10 (ref 9–23)
BUN: 6 mg/dL (ref 6–24)
CO2: 21 mmol/L (ref 20–29)
Calcium: 9.5 mg/dL (ref 8.7–10.2)
Chloride: 104 mmol/L (ref 96–106)
Creatinine, Ser: 0.59 mg/dL (ref 0.57–1.00)
GFR calc Af Amer: 132 mL/min/{1.73_m2} (ref >59)
GFR calc non Af Amer: 114 mL/min/{1.73_m2} (ref >59)
Glucose: 100 mg/dL — ABNORMAL HIGH (ref 65–99)
Potassium: 4 mmol/L (ref 3.5–5.2)
Sodium: 141 mmol/L (ref 134–144)

## 2020-05-12 DIAGNOSIS — F4310 Post-traumatic stress disorder, unspecified: Secondary | ICD-10-CM | POA: Diagnosis not present

## 2020-05-19 ENCOUNTER — Other Ambulatory Visit: Payer: Self-pay

## 2020-05-19 ENCOUNTER — Telehealth: Payer: Self-pay

## 2020-05-19 ENCOUNTER — Emergency Department (HOSPITAL_COMMUNITY)
Admission: EM | Admit: 2020-05-19 | Discharge: 2020-05-19 | Disposition: A | Discharge status: Home / Self Care | Payer: Medicaid Other | Attending: Emergency Medicine | Admitting: Emergency Medicine

## 2020-05-19 ENCOUNTER — Encounter (HOSPITAL_COMMUNITY): Payer: Self-pay | Admitting: Emergency Medicine

## 2020-05-19 ENCOUNTER — Encounter: Payer: Self-pay | Admitting: Family Medicine

## 2020-05-19 ENCOUNTER — Ambulatory Visit (INDEPENDENT_AMBULATORY_CARE_PROVIDER_SITE_OTHER): Payer: Medicaid Other | Admitting: Family Medicine

## 2020-05-19 DIAGNOSIS — H9202 Otalgia, left ear: Secondary | ICD-10-CM | POA: Diagnosis not present

## 2020-05-19 DIAGNOSIS — H61002 Unspecified perichondritis of left external ear: Secondary | ICD-10-CM | POA: Insufficient documentation

## 2020-05-19 DIAGNOSIS — Z5321 Procedure and treatment not carried out due to patient leaving prior to being seen by health care provider: Secondary | ICD-10-CM | POA: Insufficient documentation

## 2020-05-19 DIAGNOSIS — F4310 Post-traumatic stress disorder, unspecified: Secondary | ICD-10-CM | POA: Diagnosis not present

## 2020-05-19 MED ORDER — SULFAMETHOXAZOLE-TRIMETHOPRIM 800-160 MG PO TABS: 1.0000 | ORAL_TABLET | Freq: Two times a day (BID) | ORAL | 0 refills | Status: DC

## 2020-05-19 NOTE — Patient Instructions (Addendum)
It was good to see you today.  Thank you for coming in.  I think you have Ear Canal skin infection.  I am prescribing Batrim Antibiotic to be taken 2 times a day for 5 days.  You should be better in 1 week.  If you are not better by then or if you have fever, nausea/vomiting, worsening pain then come back to see [place] sooner.  Follow-up in 1 week.  Be Well Delora Fuel MD

## 2020-05-19 NOTE — ED Notes (Signed)
Pt stated that she made an appointment with her doctor then she left

## 2020-05-19 NOTE — Progress Notes (Signed)
    SUBJECTIVE:   CHIEF COMPLAINT / HPI: Ear Pain  [name] is a 39 yo female presenting with ear and face pain.  Indicates the pain is a burning type of pain.  This pain has been present for 4 days  Notices pain in ear and feels it extends to left side of face.  Has not noticed any redness or sinus pain.  No sore throat, nasal discharge or cough. No watery eyes or changes in vision.  No headaches.  Endorses difficulty hearing on left side and "like something is blocking my ear."  Denies diziness or difficulty with balance.  Has not been swimming recently.  PERTINENT  PMH / PSH: Diabetes Mellitis Type 2 Migraines Hypertension  OBJECTIVE:   BP 140/70   Pulse 78   Ht 5\' 6"  (1.676 m)   Wt 205 lb 4 oz (93.1 kg)   LMP 09/11/2017 (Approximate)   SpO2 97%   BMI 33.13 kg/m    Physical Exam Constitutional:      Appearance: Normal appearance.  HENT:     Head: Normocephalic and atraumatic. No masses, right periorbital erythema or left periorbital erythema.     Jaw: No tenderness.     Comments: Preauicular area tender to palpation    Right Ear: Tympanic membrane, ear canal and external ear normal. No drainage, swelling or tenderness. No middle ear effusion. There is no impacted cerumen. No foreign body. Tympanic membrane is not injected or erythematous.     Left Ear: Tympanic membrane normal. Swelling and tenderness present. No drainage.  No middle ear effusion. There is no impacted cerumen. No foreign body. Tympanic membrane is not injected or erythematous.     Ears:      Comments: 0.5 cm raised, erythematous papule at distal posterior left ear canal Left ear canal otherwise non-erythematous and Left TM normal    Nose: No congestion or rhinorrhea.     Right Sinus: No maxillary sinus tenderness or frontal sinus tenderness.     Left Sinus: No maxillary sinus tenderness or frontal sinus tenderness.     Mouth/Throat:     Mouth: Mucous membranes are moist.     Palate: No mass and  lesions.     Pharynx: Oropharynx is clear. No pharyngeal swelling, oropharyngeal exudate, posterior oropharyngeal erythema or uvula swelling.     Tonsils: No tonsillar exudate or tonsillar abscesses.  Neurological:     Mental Status: She is alert.     ASSESSMENT/PLAN:   Perichondritis of ear, left Appears to be bacterial soft tissue infection of left ear canal.  Appears around 0.5 cm in diameter.  Infection appears confined to this area.  Less likely Otitis Externa or Otitis Media, or Sinus infection.  Risk for MRSA infection given history of Diabetes Mellitis - Will manage with Broad specturm Oral Antibiotics given size and history of Diabetes, prescrined Bactrim 160/800 mg BID for 5 days  - Follow-up in 1 week - Return sooner if develop you have fever, nausea/vomiting, worsening pain  Hypertension Blood pressure slightly elevated at 140/70.  Patient recently went up on Losartan dosage, indicates she is tolerating well. - Patient initially had BP follow-up scheduled later this week, will follow-up next week on return visit instead - Counseled patient it was fine to take this medicine at night before bed     Delora Fuel, MD [place]

## 2020-05-19 NOTE — Assessment & Plan Note (Signed)
Appears to be bacterial soft tissue infection of left ear canal.  Appears around 0.5 cm in diameter.  Infection appears confined to this area.  Less likely Otitis Externa or Otitis Media, or Sinus infection.  Risk for MRSA infection given history of Diabetes Mellitis - Will manage with Broad specturm Oral Antibiotics given size and history of Diabetes, prescrined Bactrim 160/800 mg BID for 5 days  - Follow-up in 1 week - Return sooner if develop you have fever, nausea/vomiting, worsening pain

## 2020-05-19 NOTE — ED Triage Notes (Signed)
Pt. Stated, I started having ear pain this weekend. Left ear.

## 2020-05-19 NOTE — Telephone Encounter (Signed)
Patient calls nurse line regarding left ear pain and facial swelling on left side. Patient denies fever, cough, runny nose, or body aches.   Scheduled with Dr. Manus Rudd for 1100 this morning.   FYI to PCP  Talbot Grumbling, RN

## 2020-05-19 NOTE — Assessment & Plan Note (Signed)
Blood pressure slightly elevated at 140/70.  Patient recently went up on Losartan dosage, indicates she is tolerating well. - Patient initially had BP follow-up scheduled later this week, will follow-up next week on return visit instead - Counseled patient it was fine to take this medicine at night before bed

## 2020-05-21 ENCOUNTER — Ambulatory Visit: Payer: Medicaid Other

## 2020-05-26 DIAGNOSIS — F4310 Post-traumatic stress disorder, unspecified: Secondary | ICD-10-CM | POA: Diagnosis not present

## 2020-05-27 ENCOUNTER — Ambulatory Visit (INDEPENDENT_AMBULATORY_CARE_PROVIDER_SITE_OTHER): Payer: Medicaid Other | Admitting: Family Medicine

## 2020-05-27 ENCOUNTER — Encounter: Payer: Self-pay | Admitting: Family Medicine

## 2020-05-27 ENCOUNTER — Other Ambulatory Visit: Payer: Self-pay

## 2020-05-27 DIAGNOSIS — I10 Essential (primary) hypertension: Secondary | ICD-10-CM | POA: Diagnosis not present

## 2020-05-27 DIAGNOSIS — F17200 Nicotine dependence, unspecified, uncomplicated: Secondary | ICD-10-CM

## 2020-05-27 DIAGNOSIS — S83241D Other tear of medial meniscus, current injury, right knee, subsequent encounter: Secondary | ICD-10-CM | POA: Diagnosis not present

## 2020-05-27 DIAGNOSIS — S83281D Other tear of lateral meniscus, current injury, right knee, subsequent encounter: Secondary | ICD-10-CM | POA: Diagnosis not present

## 2020-05-27 NOTE — Progress Notes (Signed)
    SUBJECTIVE:   CHIEF COMPLAINT / HPI: Ear Pain, BP f/u  Patient currently taking Losartan 50 mg qd for BP control.  Was previously in 140's.  Indicates she is tolerating medication well.  No lightheadedness, headaches, chest pain or difficulty breathing.  Patient also indicates ear lesion from a week ago has resolved.  Indicates she finished 5 day dose of Bactrim and lesion ruptured and leaked good amount of pus.  Not currently leaking and no longer feels like ear canal is obstructed.   Discussed with patient Tobacco use.  Indicates she has cut back on smoking a good amount and hsares 3-4 cigarettes with husband a day.  Indicates she is not fully ready to quit yet.  Indicates she tried quitting with Chantix in past but not ready to quit currently.  PERTINENT  PMH / PSH: Right knee Surgery in 2020  OBJECTIVE:   BP (!) 132/80   Pulse 71   Ht 5\' 6"  (1.676 m)   Wt (!) 206 lb (93.4 kg)   LMP 09/11/2017 (Approximate)   SpO2 97%   BMI 33.25 kg/m    Physical Exam Constitutional:      General: She is not in acute distress.    Appearance: Normal appearance.  HENT:     Head: Normocephalic and atraumatic.     Right Ear: Ear canal and external ear normal.     Left Ear: Ear canal and external ear normal.  Cardiovascular:     Rate and Rhythm: Normal rate and regular rhythm.  Pulmonary:     Effort: Pulmonary effort is normal.     Breath sounds: Normal breath sounds.  Musculoskeletal:        General: Swelling and tenderness present.     Comments: Right knee tenderness and swelling  Neurological:     Mental Status: She is alert.     ASSESSMENT/PLAN:   Hypertension Well controlled today 132/80.  Tolerating medication well. - Continue Losartan 50 mg qd - F/u in 2 months  Tobacco use disorder Counseled patient on Tobacco cessation.  Patient curenttly smoking 1-2 cigarettes a day.  Not interested in fulling quitting at this time. - Praised patient on her cutting back in amount of  smoking - Will follow up with cessation in future, indicated that if at anytime she wants to fully quit to let us know   Ear Lesion Likely Perichondritis.  Given 5 day supply of Bactrim.  Appears to have resolved.  Patient no longer has pain and lesion is greatly reduced in size. - Return if have any other issues with ear   Delora Fuel, MD [place]

## 2020-05-27 NOTE — Patient Instructions (Addendum)
It was good to see you today.  Thank you for coming in.  It appears your ear infection has resolved and is healing well.  If you have any further issues with this come back and see [place].     Your blood pressure is also in a good place today at 132/80.  We discussed your knee pain today as well as smoking Cessation.    I would like to see you back in 2 months for an A1C check and diabetes management.  Be Well, Dr Kathrin Ruddy

## 2020-05-27 NOTE — Assessment & Plan Note (Addendum)
Well controlled today 132/80.  Tolerating medication well. - Continue Losartan 50 mg qd - F/u in 2 months

## 2020-05-27 NOTE — Assessment & Plan Note (Signed)
Counseled patient on Tobacco cessation.  Patient curenttly smoking 1-2 cigarettes a day.  Not interested in fulling quitting at this time. - Praised patient on her cutting back in amount of smoking - Will follow up with cessation in future, indicated that if at anytime she wants to fully quit to let us know

## 2020-06-09 DIAGNOSIS — F4310 Post-traumatic stress disorder, unspecified: Secondary | ICD-10-CM | POA: Diagnosis not present

## 2020-06-10 DIAGNOSIS — R202 Paresthesia of skin: Secondary | ICD-10-CM | POA: Diagnosis not present

## 2020-06-10 DIAGNOSIS — R200 Anesthesia of skin: Secondary | ICD-10-CM | POA: Diagnosis not present

## 2020-06-16 DIAGNOSIS — F4310 Post-traumatic stress disorder, unspecified: Secondary | ICD-10-CM | POA: Diagnosis not present

## 2020-06-17 ENCOUNTER — Ambulatory Visit: Payer: Medicaid Other

## 2020-06-17 ENCOUNTER — Other Ambulatory Visit: Payer: Self-pay

## 2020-06-17 ENCOUNTER — Ambulatory Visit (INDEPENDENT_AMBULATORY_CARE_PROVIDER_SITE_OTHER): Payer: Medicaid Other

## 2020-06-17 DIAGNOSIS — Z23 Encounter for immunization: Secondary | ICD-10-CM | POA: Diagnosis not present

## 2020-06-17 NOTE — Progress Notes (Signed)
   Covid-19 Vaccination Clinic  Name:  [name]    MRN: [id number] DOB: [birth year]  06/17/2020   Patient reports to nurse clinic for second COVID injection. Patient denies previous allergic reaction to vaccine, however, does have severe allergic reactions to other medications. Answers no to all other screening questions. Administered in Left Deltoid, site unremarkable. Provided patient with updated copy of immunization record and immunization card.   [name] was observed post Covid-19 immunization for 30 minutes based on pre-vaccination screening without incident. She was provided with Vaccine Information Sheet and instruction to access the V-Safe system.   [name] was instructed to call 911 with any severe reactions post vaccine: Marland Kitchen Difficulty breathing  . Swelling of face and throat  . A fast heartbeat  . A bad rash all over body  . Dizziness and weakness

## 2020-06-19 DIAGNOSIS — M5416 Radiculopathy, lumbar region: Secondary | ICD-10-CM | POA: Diagnosis not present

## 2020-06-19 DIAGNOSIS — R202 Paresthesia of skin: Secondary | ICD-10-CM | POA: Diagnosis not present

## 2020-06-26 DIAGNOSIS — F4310 Post-traumatic stress disorder, unspecified: Secondary | ICD-10-CM | POA: Diagnosis not present

## 2020-06-30 DIAGNOSIS — F4310 Post-traumatic stress disorder, unspecified: Secondary | ICD-10-CM | POA: Diagnosis not present

## 2020-07-01 ENCOUNTER — Other Ambulatory Visit: Payer: Medicaid Other

## 2020-07-14 DIAGNOSIS — F4310 Post-traumatic stress disorder, unspecified: Secondary | ICD-10-CM | POA: Diagnosis not present

## 2020-07-17 ENCOUNTER — Other Ambulatory Visit: Payer: Self-pay | Admitting: Family Medicine

## 2020-07-17 DIAGNOSIS — F4310 Post-traumatic stress disorder, unspecified: Secondary | ICD-10-CM | POA: Diagnosis not present

## 2020-07-21 DIAGNOSIS — F4310 Post-traumatic stress disorder, unspecified: Secondary | ICD-10-CM | POA: Diagnosis not present

## 2020-07-24 DIAGNOSIS — F4310 Post-traumatic stress disorder, unspecified: Secondary | ICD-10-CM | POA: Diagnosis not present

## 2020-07-28 DIAGNOSIS — F4310 Post-traumatic stress disorder, unspecified: Secondary | ICD-10-CM | POA: Diagnosis not present

## 2020-08-06 DIAGNOSIS — F4310 Post-traumatic stress disorder, unspecified: Secondary | ICD-10-CM | POA: Diagnosis not present

## 2020-08-08 DIAGNOSIS — F4310 Post-traumatic stress disorder, unspecified: Secondary | ICD-10-CM | POA: Diagnosis not present

## 2020-08-15 DIAGNOSIS — F4310 Post-traumatic stress disorder, unspecified: Secondary | ICD-10-CM | POA: Diagnosis not present

## 2020-08-18 DIAGNOSIS — F4310 Post-traumatic stress disorder, unspecified: Secondary | ICD-10-CM | POA: Diagnosis not present

## 2020-08-21 DIAGNOSIS — F4310 Post-traumatic stress disorder, unspecified: Secondary | ICD-10-CM | POA: Diagnosis not present

## 2020-08-26 IMAGING — CR RIGHT KNEE - COMPLETE 4+ VIEW
4 series · 4 of 4 positions shown · non-contrast
Comparison: None.

CLINICAL DATA: Right knee pain and swelling for 1-2 days.

EXAM:
RIGHT KNEE - COMPLETE 4+ VIEW

[w knee ap right]
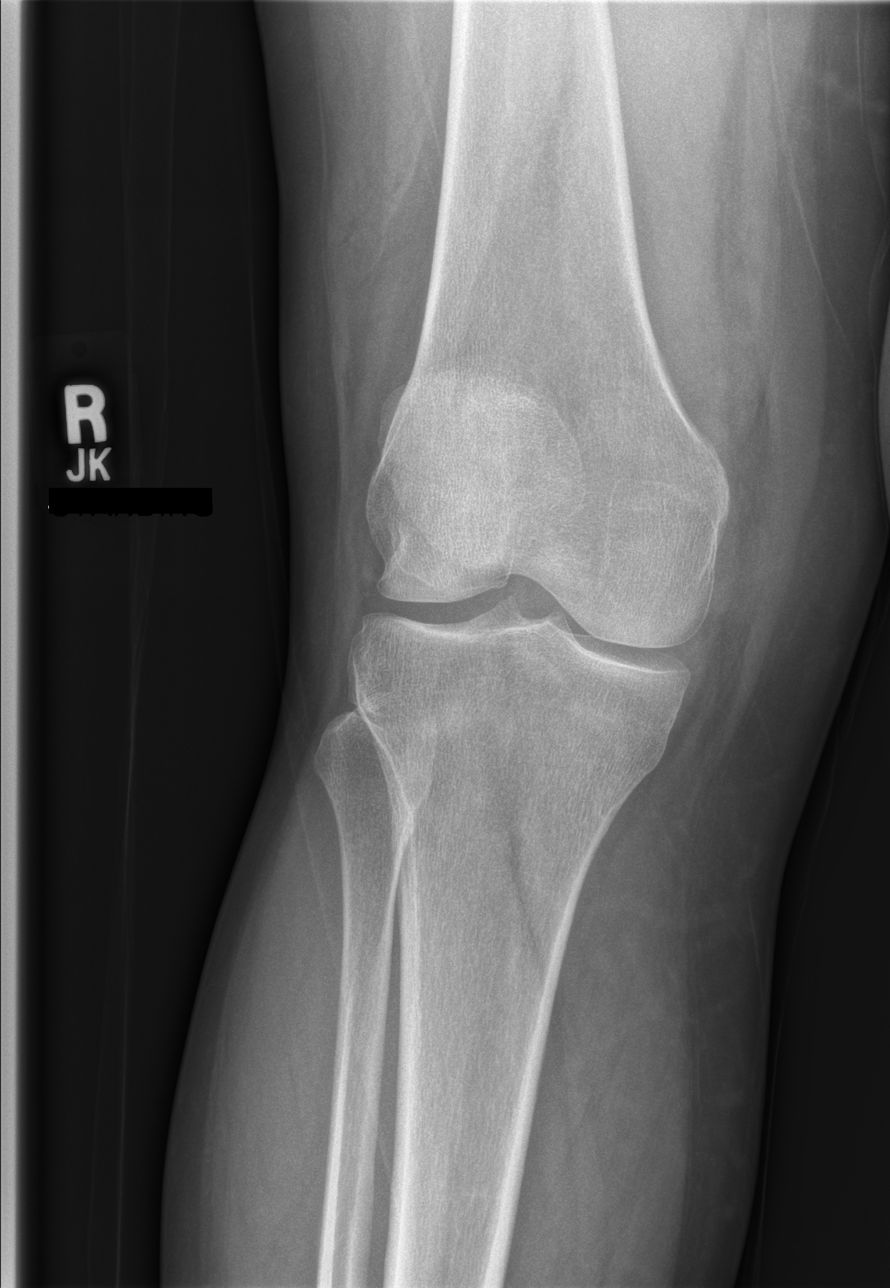

[w knee lat right (1 of 2)]
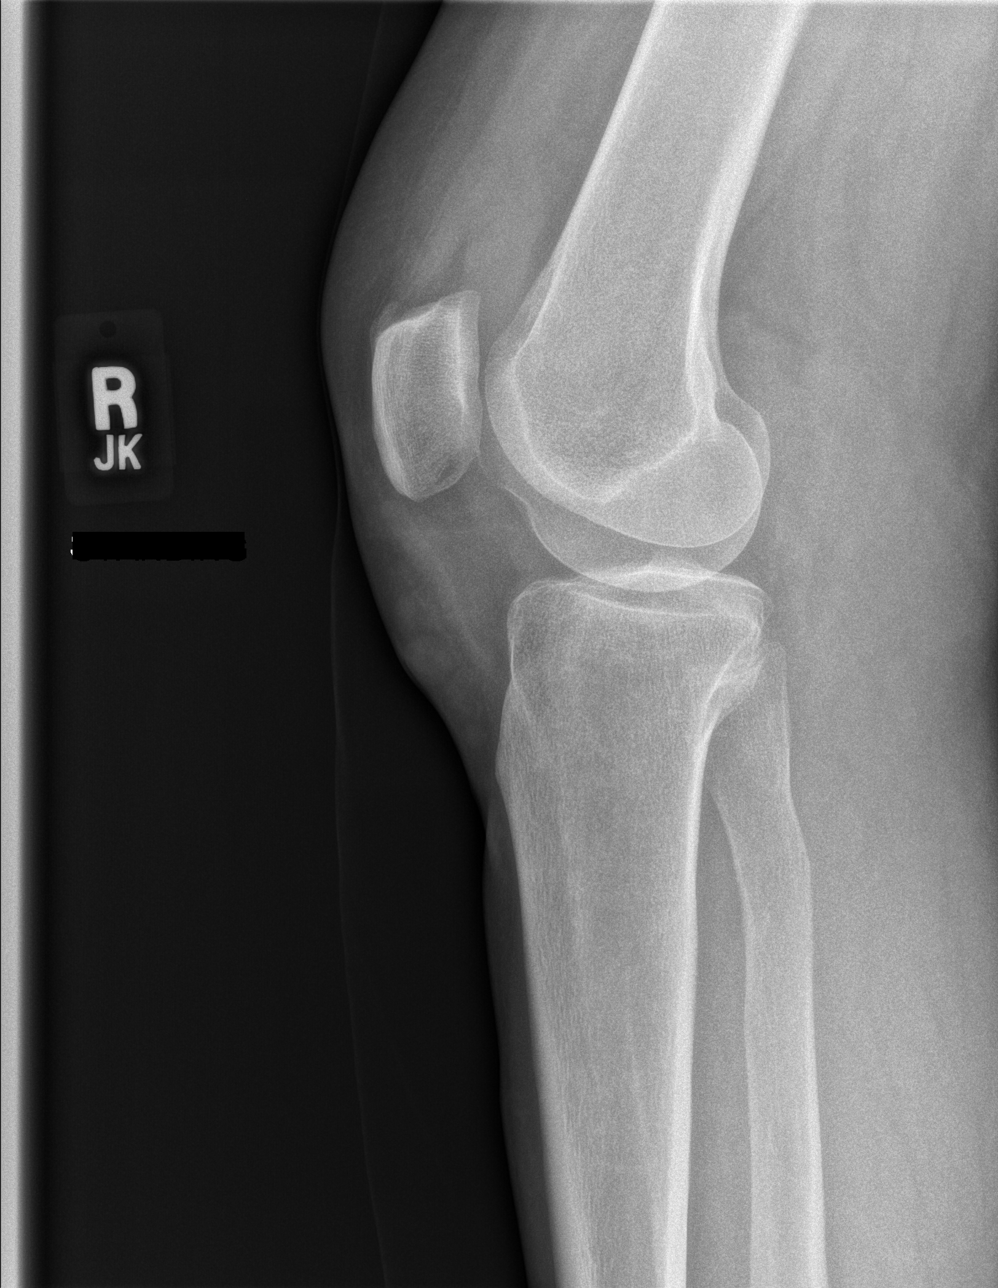

[w knee lat right (2 of 2)]
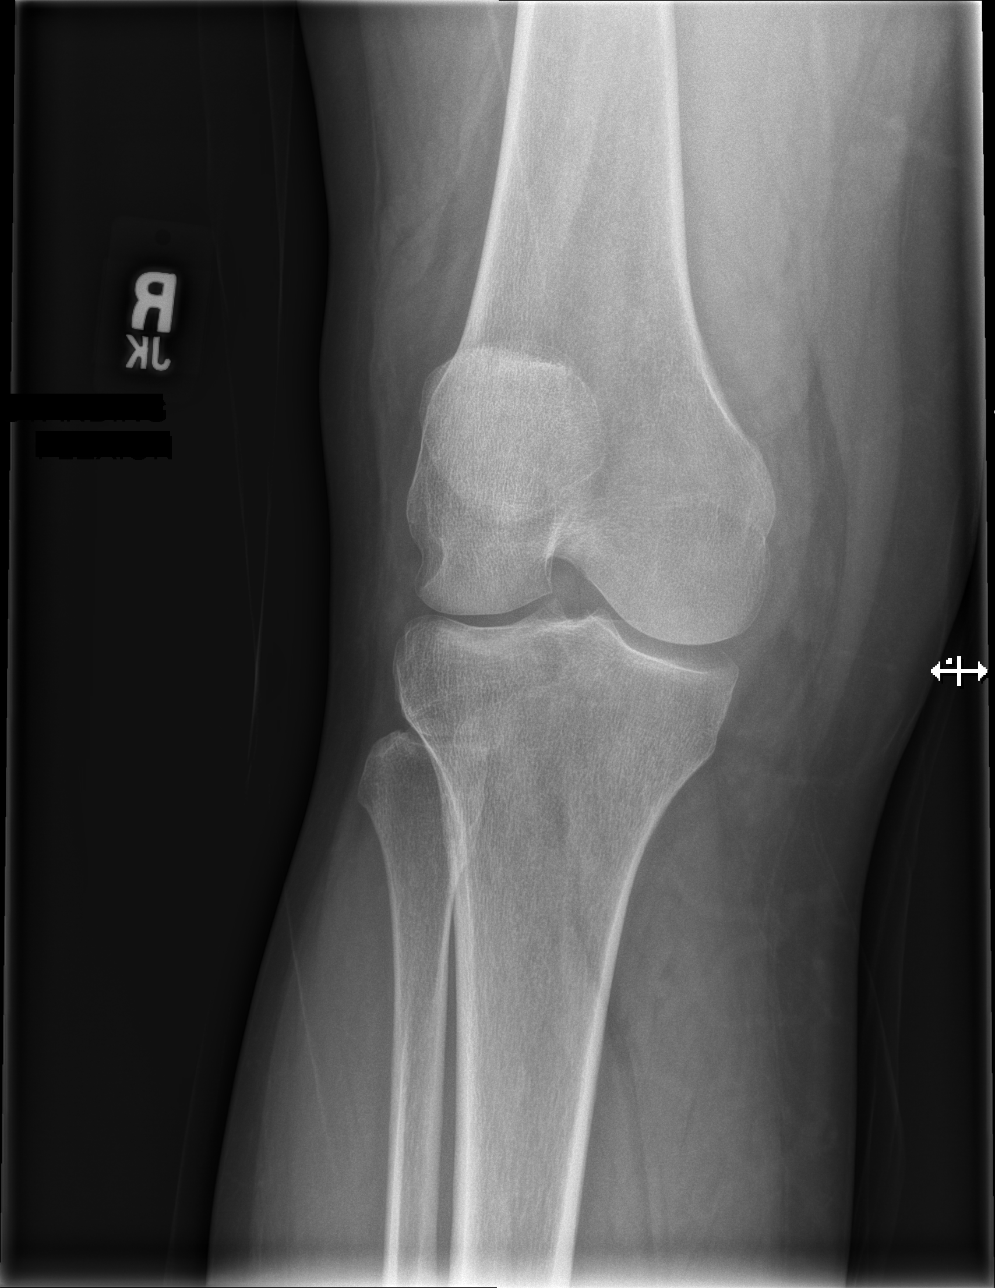

[x knee tunnel right]
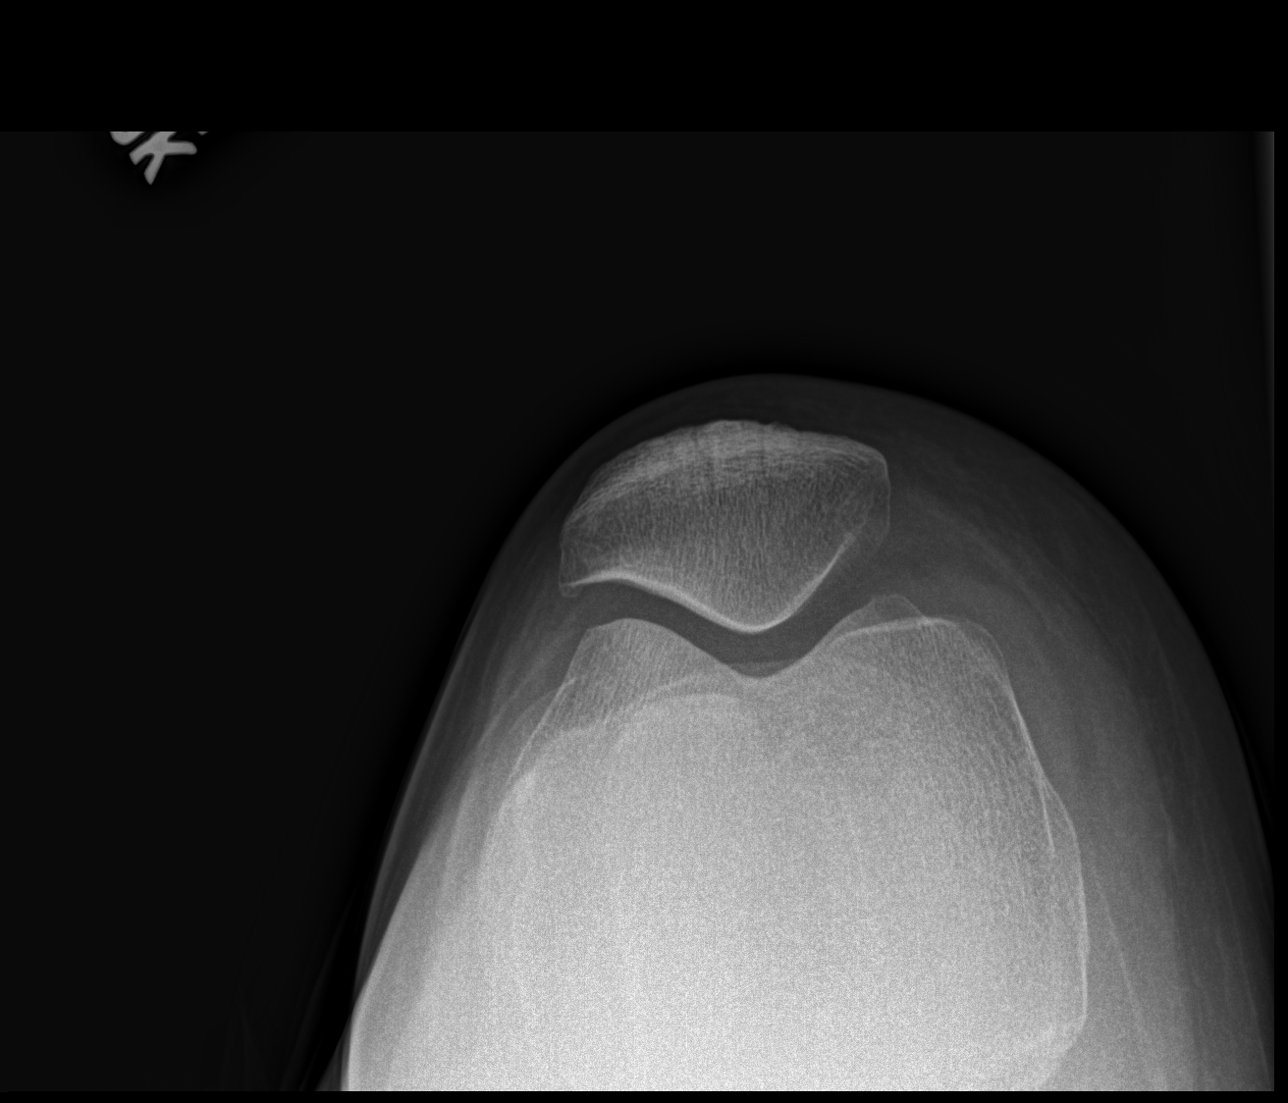

[4 of 4 positions shown; findings below may reference images not displayed]

FINDINGS: No acute fracture or dislocation is identified. There is a small
knee joint effusion. Femorotibial and patellofemoral joint space
widths are preserved. A small superior patellar enthesophyte is
noted.
IMPRESSION: Small knee joint effusion without acute osseous abnormality
identified.

## 2020-08-28 DIAGNOSIS — F4310 Post-traumatic stress disorder, unspecified: Secondary | ICD-10-CM | POA: Diagnosis not present

## 2020-09-01 DIAGNOSIS — Z6832 Body mass index (BMI) 32.0-32.9, adult: Secondary | ICD-10-CM | POA: Diagnosis not present

## 2020-09-01 DIAGNOSIS — M5416 Radiculopathy, lumbar region: Secondary | ICD-10-CM | POA: Diagnosis not present

## 2020-09-01 DIAGNOSIS — F4310 Post-traumatic stress disorder, unspecified: Secondary | ICD-10-CM | POA: Diagnosis not present

## 2020-09-01 DIAGNOSIS — R200 Anesthesia of skin: Secondary | ICD-10-CM | POA: Diagnosis not present

## 2020-09-01 DIAGNOSIS — M961 Postlaminectomy syndrome, not elsewhere classified: Secondary | ICD-10-CM | POA: Diagnosis not present

## 2020-09-01 DIAGNOSIS — I10 Essential (primary) hypertension: Secondary | ICD-10-CM | POA: Diagnosis not present

## 2020-09-04 DIAGNOSIS — F4310 Post-traumatic stress disorder, unspecified: Secondary | ICD-10-CM | POA: Diagnosis not present

## 2020-09-05 ENCOUNTER — Ambulatory Visit (INDEPENDENT_AMBULATORY_CARE_PROVIDER_SITE_OTHER): Payer: Medicaid Other | Admitting: Family Medicine

## 2020-09-05 ENCOUNTER — Other Ambulatory Visit: Payer: Self-pay

## 2020-09-05 VITALS — BP 156/84 | HR 90 | Wt 205.0 lb

## 2020-09-05 DIAGNOSIS — L03032 Cellulitis of left toe: Secondary | ICD-10-CM | POA: Diagnosis not present

## 2020-09-05 DIAGNOSIS — R202 Paresthesia of skin: Secondary | ICD-10-CM | POA: Diagnosis not present

## 2020-09-05 DIAGNOSIS — E119 Type 2 diabetes mellitus without complications: Secondary | ICD-10-CM

## 2020-09-05 DIAGNOSIS — R200 Anesthesia of skin: Secondary | ICD-10-CM | POA: Diagnosis not present

## 2020-09-05 DIAGNOSIS — Z23 Encounter for immunization: Secondary | ICD-10-CM | POA: Diagnosis not present

## 2020-09-05 LAB — POCT GLYCOSYLATED HEMOGLOBIN (HGB A1C): Hemoglobin A1C: 7 % — AB (ref 4.0–5.6)

## 2020-09-05 MED ORDER — MUPIROCIN 2 % EX OINT: 1.0000 "application " | TOPICAL_OINTMENT | Freq: Two times a day (BID) | CUTANEOUS | 0 refills | Status: DC

## 2020-09-05 NOTE — Progress Notes (Signed)
    SUBJECTIVE:   CHIEF COMPLAINT / HPI: Neuropathy  Patient indicates she will ocasionally have numbness and tingling in hands and feet.  Indicates episodes will last between 10-30 minutes before resolving.  Is worried with driving given episodes.  Denies any lightheadedness or tonic/clonic movements.  Has received extensive imaging in [facility].  Also sees pain specialist through clinic who recommended Cymbalta, but indicated would need to see PCP before starting as patient currently on SSRI.  Indicates SSRI has been controlling symptoms of depression/anxiety well.    Also complaining of inflammation of left big toe.  Indicates it has been going on for a few weeks now.  Recently went to nail salon where fluid was expressed from left side of nail bed.  PERTINENT  PMH / PSH: Surgery of lumbar spine  OBJECTIVE:   BP (!) 156/84   Pulse 90   Wt 205 lb (93 kg)   LMP 09/11/2017 (Approximate)   SpO2 95%   BMI 33.09 kg/m    Physical Exam Constitutional:      General: She is not in acute distress.    Appearance: Normal appearance. She is not ill-appearing.  HENT:     Head: Normocephalic and atraumatic.     Mouth/Throat:     Mouth: Mucous membranes are moist.  Cardiovascular:     Rate and Rhythm: Normal rate and regular rhythm.     Pulses: Normal pulses.  Pulmonary:     Effort: Pulmonary effort is normal.     Breath sounds: Normal breath sounds.  Neurological:     Mental Status: She is alert and oriented to person, place, and time.     Cranial Nerves: No cranial nerve deficit.     Comments: Right grip strength, hip flexor and knee extension all weaker on      ASSESSMENT/PLAN:   Paronychia of great toe of left foot Likely diagnosis given expression of pus and inflammation of surrounding toe.  No fever, n/v or other signs/symptoms of systemic infection currently.   - Recommend foot baths with warm water and vaseline application - Prescribing Mupirocen cream to  be applied BID - return immediately if any worsening redness, severe pain or fever - return in 1 month  Peripheral neuropathy Possible polyneuropathy as cause.  Less likely neuropathy solely attributable to spinal surgery as patient having weakness/symptoms in upper limbs as well. Less likely diabetic neuropathy as patinet A1C has been well controlled with A1C-7.0.  Less likely MS given short transient episodes of symptoms.  Will obtain B12 to rule out deficiency and BMP to rule out electrolyte deficiency. - Recommend patient avoid driving while symptoms occuring - Request imaging/records from [facility] - Will obtain B12 and BMP - f/u on symptoms in 1 month with plan to transition to Cymbalta from Prozac  Diabetes type 2, controlled (HCC) A1C currently 7.0.  Previously 7.4 - Praised paatient for efforts of keeping glucose under control - Continue current medication regimen and carb controlled diet   Immunizations - Flu vaccine given today   Delora Fuel, MD [place]

## 2020-09-05 NOTE — Patient Instructions (Addendum)
It was good to see you today.  Thank you for coming in.  I think you have an in-grown toenail with concern for infection.  I recommend to soak in warm water and also apply some mupirocin cream alongside the nail.  For your neuropathy I would like to test your B12 and electrolytes today.  I am also requesting records from your spine doctor.  I would also like to see you back in a month to start tapering to switch you to Cymbalta.  Be Well, Dr Kathrin Ruddy

## 2020-09-06 LAB — BASIC METABOLIC PANEL
BUN/Creatinine Ratio: 12 (ref 9–23)
BUN: 6 mg/dL (ref 6–24)
CO2: 23 mmol/L (ref 20–29)
Calcium: 9.6 mg/dL (ref 8.7–10.2)
Chloride: 100 mmol/L (ref 96–106)
Creatinine, Ser: 0.49 mg/dL — ABNORMAL LOW (ref 0.57–1.00)
GFR calc Af Amer: 140 mL/min/{1.73_m2} (ref >59)
GFR calc non Af Amer: 121 mL/min/{1.73_m2} (ref >59)
Glucose: 135 mg/dL — ABNORMAL HIGH (ref 65–99)
Potassium: 4 mmol/L (ref 3.5–5.2)
Sodium: 140 mmol/L (ref 134–144)

## 2020-09-06 LAB — VITAMIN B12: Vitamin B-12: 169 pg/mL — ABNORMAL LOW (ref 232–1245)

## 2020-09-07 ENCOUNTER — Encounter: Payer: Self-pay | Admitting: Family Medicine

## 2020-09-07 DIAGNOSIS — L03032 Cellulitis of left toe: Secondary | ICD-10-CM | POA: Insufficient documentation

## 2020-09-07 NOTE — Assessment & Plan Note (Signed)
Likely diagnosis given expression of pus and inflammation of surrounding toe.  No fever, n/v or other signs/symptoms of systemic infection currently.   - Recommend foot baths with warm water and vaseline application - Prescribing Mupirocen cream to be applied BID - return immediately if any worsening redness, severe pain or fever - return in 1 month

## 2020-09-07 NOTE — Assessment & Plan Note (Signed)
Possible polyneuropathy as cause.  Less likely neuropathy solely attributable to spinal surgery as patient having weakness/symptoms in upper limbs as well. Less likely diabetic neuropathy as patinet A1C has been well controlled with A1C-7.0.  Less likely MS given short transient episodes of symptoms.  Will obtain B12 to rule out deficiency and BMP to rule out electrolyte deficiency. - Recommend patient avoid driving while symptoms occuring - Request imaging/records from [facility] - Will obtain B12 and BMP - f/u on symptoms in 1 month with plan to transition to Cymbalta from Prozac

## 2020-09-07 NOTE — Assessment & Plan Note (Signed)
A1C currently 7.0.  Previously 7.4 - Praised paatient for efforts of keeping glucose under control - Continue current medication regimen and carb controlled diet

## 2020-09-08 DIAGNOSIS — F4310 Post-traumatic stress disorder, unspecified: Secondary | ICD-10-CM | POA: Diagnosis not present

## 2020-09-12 DIAGNOSIS — F4310 Post-traumatic stress disorder, unspecified: Secondary | ICD-10-CM | POA: Diagnosis not present

## 2020-09-16 ENCOUNTER — Telehealth: Payer: Self-pay | Admitting: Family Medicine

## 2020-09-16 NOTE — Telephone Encounter (Signed)
Spoke with patient regarding low B12.Indicated is already taking oral B12 1000 mg supplement.Will discuss further at upcoming visit.   Delora Fuel, MD 09/16/2020, 3:41 PM PGY-1, [place]

## 2020-09-18 DIAGNOSIS — F4310 Post-traumatic stress disorder, unspecified: Secondary | ICD-10-CM | POA: Diagnosis not present

## 2020-10-02 ENCOUNTER — Ambulatory Visit: Payer: Medicaid Other | Admitting: Family Medicine

## 2020-10-02 ENCOUNTER — Encounter: Payer: Self-pay | Admitting: Family Medicine

## 2020-10-02 ENCOUNTER — Other Ambulatory Visit: Payer: Self-pay

## 2020-10-02 VITALS — BP 138/78 | HR 86 | Ht 66.0 in | Wt 207.0 lb

## 2020-10-02 DIAGNOSIS — G609 Hereditary and idiopathic neuropathy, unspecified: Secondary | ICD-10-CM | POA: Diagnosis not present

## 2020-10-02 DIAGNOSIS — E538 Deficiency of other specified B group vitamins: Secondary | ICD-10-CM | POA: Diagnosis not present

## 2020-10-02 MED ORDER — DULOXETINE HCL 30 MG PO CPEP: 30.0000 mg | ORAL_CAPSULE | Freq: Every day | ORAL | 0 refills | Status: DC

## 2020-10-02 MED ORDER — FLUOXETINE HCL 10 MG PO CAPS: 10.0000 mg | ORAL_CAPSULE | Freq: Every day | ORAL | 0 refills | Status: DC

## 2020-10-02 MED ORDER — FLUOXETINE HCL 20 MG PO TABS: 20.0000 mg | ORAL_TABLET | Freq: Every day | ORAL | 0 refills | Status: DC

## 2020-10-02 MED ORDER — CYANOCOBALAMIN 1000 MCG/ML IJ SOLN: 1000.0000 ug | INTRAMUSCULAR | Status: DC

## 2020-10-02 NOTE — Progress Notes (Signed)
    SUBJECTIVE:   CHIEF COMPLAINT / HPI:   Patient indicates she will ocasionally have numbness and tingling in hands and feet.  Indicates episodes will last between 10-30 minutes before resolving.  Denies any lightheadedness or tonic/clonic movements.  Has received extensive imaging in [facility] that was all found to be normal.  Also sees pain specialist through clinic who recommended Cymbalta, but indicated would need to see PCP before starting as patient currently on SSRI.  Indicates SSRI has been controlling symptoms of depression/anxiety well.   Today has new issue with right hand grasping.  Previously patient has had low B12 of 169, despite taking oral therapy.    Patient also previously had Kernicterus which resolved with antibiotic but still appears to have Onchomycosis of right big toe  PERTINENT  PMH / PSH: Surgery of lumbar spine  OBJECTIVE:   BP 138/78   Pulse 86   Ht 5\' 6"  (1.676 m)   Wt 207 lb (93.9 kg)   LMP 09/11/2017 (Approximate)   SpO2 98%   BMI 33.41 kg/m    Physical Exam Constitutional:      General: She is not in acute distress.    Appearance: Normal appearance. She is not ill-appearing.  HENT:     Head: Normocephalic and atraumatic.  Cardiovascular:     Rate and Rhythm: Normal rate and regular rhythm.     Pulses: Normal pulses.  Pulmonary:     Effort: Pulmonary effort is normal.     Breath sounds: Normal breath sounds.  Skin:    General: Skin is warm.  Neurological:     Mental Status: She is alert.     Cranial Nerves: Cranial nerves are intact.     Motor: Weakness present.     Coordination: Coordination is intact.     Gait: Gait abnormal.     Comments: Weakness in right hip flexion and right hand grasp. Limited dorsiflexion in both feet.      ASSESSMENT/PLAN:   Peripheral neuropathy Less likely to be diabetic in origin given patient's well controlled A1C.  Patient has had extensive imaging and nerve studies so less likely  MS or other myelinic disease.   B12 is low but not extremely low.  Pain has not been resposive to highest doses of Gabapentin.  Spoke with Pharmacist Dr Valentina Lucks about tapering Prozac to start Cymbalta.    - Add on and schedule monthly B12 injection since still low with daily oral B12,  - Cross-taper from Prozac to Cymbalta, 1 week of Prozac 20 mg from 12/3-12/9. then Prozac 10 mg for 1 week from 12/10-12/16, Then stop Prozac and start Cymbalta 30 mg from 12/17 onward - F/u in 1 month on Cymbalta and consider increasing dose     Hypertension BP improved on re-check at 138/78. - Re-check in 1 month    Onchomycoses Will discuss further at future visit.  Likely prescribe Terbinafine.   Delora Fuel, MD [place]

## 2020-10-02 NOTE — Patient Instructions (Addendum)
It was good to see you today.  Thank you for coming in.  I think you have nerve pain.  I will start transitioning you from Prozac to Cymbalta.  I want you to start with 1 week of Prozac 20 mg from 12/3-12/9.  Then take Prozac 10 mg for 1 week from 12/10-12/16.  Then stop Prozac and start Cymbalta 30 mg from 12/17 onward.  I will also start you getting monthly B12 shots.  I will have my nurse schedule you for these.    Then I would like to see you again in the new year in 1 month.  Continue to take Tylenol 1000 mg every 6 hours in the meantime for the chest pain.  Be Well, Dr Manus Rudd

## 2020-10-03 DIAGNOSIS — Z0000 Encounter for general adult medical examination without abnormal findings: Secondary | ICD-10-CM | POA: Diagnosis not present

## 2020-10-03 DIAGNOSIS — Z1272 Encounter for screening for malignant neoplasm of vagina: Secondary | ICD-10-CM | POA: Diagnosis not present

## 2020-10-03 DIAGNOSIS — N760 Acute vaginitis: Secondary | ICD-10-CM | POA: Diagnosis not present

## 2020-10-06 DIAGNOSIS — F4310 Post-traumatic stress disorder, unspecified: Secondary | ICD-10-CM | POA: Diagnosis not present

## 2020-10-09 DIAGNOSIS — F4310 Post-traumatic stress disorder, unspecified: Secondary | ICD-10-CM | POA: Diagnosis not present

## 2020-10-09 NOTE — Assessment & Plan Note (Signed)
BP improved on re-check at 138/78. - Re-check in 1 month

## 2020-10-09 NOTE — Assessment & Plan Note (Addendum)
Less likely to be diabetic in origin given patient's well controlled A1C.  Patient has had extensive imaging and nerve studies so less likely MS or other myelinic disease.   B12 is low but not extremely low.  Pain has not been resposive to highest doses of Gabapentin.  Spoke with Pharmacist Dr Valentina Lucks about tapering Prozac to start Cymbalta.    - Add on and schedule monthly B12 injection since still low with daily oral B12,  - Cross-taper from Prozac to Cymbalta, 1 week of Prozac 20 mg from 12/3-12/9. then Prozac 10 mg for 1 week from 12/10-12/16, Then stop Prozac and start Cymbalta 30 mg from 12/17 onward - F/u in 1 month on Cymbalta and consider increasing dose

## 2020-10-13 DIAGNOSIS — F4310 Post-traumatic stress disorder, unspecified: Secondary | ICD-10-CM | POA: Diagnosis not present

## 2020-10-16 DIAGNOSIS — F4310 Post-traumatic stress disorder, unspecified: Secondary | ICD-10-CM | POA: Diagnosis not present

## 2020-10-20 ENCOUNTER — Other Ambulatory Visit: Payer: Self-pay | Admitting: Obstetrics and Gynecology

## 2020-10-20 DIAGNOSIS — N644 Mastodynia: Secondary | ICD-10-CM

## 2020-11-06 ENCOUNTER — Ambulatory Visit: Payer: Medicaid Other | Admitting: Family Medicine

## 2020-11-06 ENCOUNTER — Encounter: Payer: Self-pay | Admitting: Family Medicine

## 2020-11-06 ENCOUNTER — Other Ambulatory Visit: Payer: Self-pay

## 2020-11-06 VITALS — BP 160/92 | HR 89 | Wt 203.4 lb

## 2020-11-06 DIAGNOSIS — B351 Tinea unguium: Secondary | ICD-10-CM

## 2020-11-06 DIAGNOSIS — L600 Ingrowing nail: Secondary | ICD-10-CM | POA: Diagnosis not present

## 2020-11-06 DIAGNOSIS — R4589 Other symptoms and signs involving emotional state: Secondary | ICD-10-CM | POA: Diagnosis not present

## 2020-11-06 MED ORDER — DULOXETINE HCL 60 MG PO CPEP: 60.0000 mg | ORAL_CAPSULE | Freq: Every day | ORAL | 3 refills | Status: DC

## 2020-11-06 MED ORDER — TERBINAFINE HCL 250 MG PO TABS: 250.0000 mg | ORAL_TABLET | Freq: Every day | ORAL | Status: DC

## 2020-11-06 NOTE — Progress Notes (Signed)
    SUBJECTIVE:   CHIEF COMPLAINT / HPI: Back Pain  Patient indicates she continues to have back pain as well as pain, numbness and weakness in all 4 limbs.  Inidcates the Cymbalta dose of 30 mg did help with depression and anxiety for awhile.  Also has not been sleeping as well lately.  Indicates she has been frequently been taking husbands 600 mg Ibuprofen.      Patient also indicates still having pain with left big toe intermittently and that the nail has not been growing.  PERTINENT  PMH / PSH: Hypertension, Type 2 Diabetes  OBJECTIVE:   BP (!) 160/92   Pulse 89   Wt 203 lb 6.4 oz (92.3 kg)   LMP 09/11/2017 (Approximate)   SpO2 97%   BMI 32.83 kg/m   On BP re-check was 140/78  Physical Exam Constitutional:      General: She is not in acute distress.    Appearance: She is not ill-appearing.  Cardiovascular:     Rate and Rhythm: Normal rate and regular rhythm.     Pulses: Normal pulses.          Dorsalis pedis pulses are 2+ on the right side and 2+ on the left side.  Pulmonary:     Effort: Pulmonary effort is normal.     Breath sounds: Normal breath sounds.  Feet:     Left foot:     Skin integrity: Erythema present.     Toenail Condition: Left toenails are abnormally thick.     Comments: Left main toenail, toe slightly erythematous and tender to palpation Skin:    Capillary Refill: Capillary refill takes less than 2 seconds.  Neurological:     Mental Status: She is alert.       ASSESSMENT/PLAN:   Hypertension Measured at 140/78 on recheck with patient sitting down and resting.  Currently on Losartan 50 mg. - Re-check in 1 month - Will consider increasing Losartan dosage or starting second medication  Diabetes type 2, controlled (HCC) - Obtain A1C in 1 month  Peripheral neuropathy Less likely to be diabetic in origin given patient's well controlled A1C.  Patient has had extensive imaging and nerve studies so less likely MS or other myelinic disease.   B12  is low but not extremely low.  Pain has not been resposive to highest doses of Gabapentin.  Spoke with Pharmacist Dr Raymondo Band about tapering Prozac to start Cymbalta.  Has finished Taper. - Increase to Cymbalta 60 mg - Counseled agianst taking husbands Ibuprofen.      Onychomycosis with ingrown toenail Has moderate amount of toenail infection.  No sign of returning Paronychia. - Start Oral Terbinafine for 3 months - Check Liver labs & CBC in 1 month  Depressed mood - Increase to Cymbalta 60 mg    Jovita Kussmaul, MD [facility]

## 2020-11-06 NOTE — Patient Instructions (Addendum)
It was good to see you today.  Thank you for coming in.  I think you have Nerve Pain.  I am going to increase your Cymbalta to 60 mg.   I would like to see you back in 1 month to see how your pain is doing and how your mood is and check your A1C and to follow-up on your blood pressure.  If you are not better by then or if you have significantly worse pain, please come back and see [place].  Please continue to get your B12 shots.  I am also prescribing a pill called Terbinafine to take once a day for 6 months. help with your toenail fungus.  Be Well, Dr Pecola Leisure

## 2020-11-07 DIAGNOSIS — B351 Tinea unguium: Secondary | ICD-10-CM | POA: Insufficient documentation

## 2020-11-07 NOTE — Assessment & Plan Note (Signed)
Measured at 140/78 on recheck with patient sitting down and resting.  Currently on Losartan 50 mg. - Re-check in 1 month - Will consider increasing Losartan dosage or starting second medication

## 2020-11-07 NOTE — Assessment & Plan Note (Signed)
Has moderate amount of toenail infection.  No sign of returning Paronychia. - Start Oral Terbinafine for 3 months - Check Liver labs & CBC in 1 month

## 2020-11-07 NOTE — Assessment & Plan Note (Signed)
Less likely to be diabetic in origin given patient's well controlled A1C.  Patient has had extensive imaging and nerve studies so less likely MS or other myelinic disease.   B12 is low but not extremely low.  Pain has not been resposive to highest doses of Gabapentin.  Spoke with Pharmacist Dr Valentina Lucks about tapering Prozac to start Cymbalta.  Has finished Taper. - Increase to Cymbalta 60 mg - Counseled agianst taking husbands Ibuprofen.

## 2020-11-07 NOTE — Assessment & Plan Note (Signed)
-   Increase to Cymbalta 60 mg

## 2020-11-07 NOTE — Assessment & Plan Note (Signed)
-   Obtain A1C in 1 month

## 2020-11-20 ENCOUNTER — Other Ambulatory Visit: Payer: Medicaid Other

## 2020-11-25 DIAGNOSIS — F4310 Post-traumatic stress disorder, unspecified: Secondary | ICD-10-CM | POA: Diagnosis not present

## 2020-12-02 DIAGNOSIS — F4310 Post-traumatic stress disorder, unspecified: Secondary | ICD-10-CM | POA: Diagnosis not present

## 2020-12-09 DIAGNOSIS — F4310 Post-traumatic stress disorder, unspecified: Secondary | ICD-10-CM | POA: Diagnosis not present

## 2020-12-12 ENCOUNTER — Ambulatory Visit (INDEPENDENT_AMBULATORY_CARE_PROVIDER_SITE_OTHER): Payer: Medicaid Other | Admitting: Family Medicine

## 2020-12-12 ENCOUNTER — Other Ambulatory Visit: Payer: Self-pay

## 2020-12-12 VITALS — BP 144/80 | HR 75 | Ht 66.0 in | Wt 205.2 lb

## 2020-12-12 DIAGNOSIS — L600 Ingrowing nail: Secondary | ICD-10-CM | POA: Diagnosis not present

## 2020-12-12 DIAGNOSIS — M792 Neuralgia and neuritis, unspecified: Secondary | ICD-10-CM | POA: Diagnosis not present

## 2020-12-12 DIAGNOSIS — B351 Tinea unguium: Secondary | ICD-10-CM | POA: Diagnosis not present

## 2020-12-12 DIAGNOSIS — R4589 Other symptoms and signs involving emotional state: Secondary | ICD-10-CM | POA: Diagnosis not present

## 2020-12-12 DIAGNOSIS — E119 Type 2 diabetes mellitus without complications: Secondary | ICD-10-CM

## 2020-12-12 LAB — POCT GLYCOSYLATED HEMOGLOBIN (HGB A1C): Hemoglobin A1C: 7.4 % — AB (ref 4.0–5.6)

## 2020-12-12 MED ORDER — LOSARTAN POTASSIUM 100 MG PO TABS: 100.0000 mg | ORAL_TABLET | Freq: Every day | ORAL | 3 refills | Status: DC

## 2020-12-12 MED ORDER — SUMATRIPTAN SUCCINATE 50 MG PO TABS: 50.0000 mg | ORAL_TABLET | ORAL | 3 refills | Status: AC | PRN

## 2020-12-12 MED ORDER — DULOXETINE HCL 30 MG PO CPEP: 30.0000 mg | ORAL_CAPSULE | Freq: Every day | ORAL | 3 refills | Status: DC

## 2020-12-12 MED ORDER — TERBINAFINE HCL 250 MG PO TABS: 250.0000 mg | ORAL_TABLET | Freq: Every day | ORAL | 2 refills | Status: DC

## 2020-12-12 NOTE — Progress Notes (Signed)
fo

## 2020-12-12 NOTE — Patient Instructions (Addendum)
It was good to see you today.  Thank you for coming in.  Your A1C today was 7.4 a slight increase from 7.0 of 3 months ago.  I am refilling your Sumatriptan for your migraines as well.    I am also referring you to [facility] to further help with your nerve pain.  I am also decreasing your Cymbalta down to 30 mg daily.    Your blood pressure on repeat was 144/80.  I am increasing your Losartan to 100 mg.   I am also prescribing Oral Terbinafine for your foot fungus.  If you cannot pick this up from your Pharmacy, please give [place] a call.  For your hand continue to use Vaseline.  I would like to see you back in 1 month to check on your thumb and get labs.  Come back sooner if it starts to look infected or get worse.  Be Well, Dr Manus Rudd

## 2020-12-16 NOTE — Assessment & Plan Note (Signed)
Patient blood pressure elevated again today at 144/80.  Currently on Lodsartan 50 mg dailky - Increase to Losartan 100 mg to maximize dosage given diabetes

## 2020-12-16 NOTE — Assessment & Plan Note (Signed)
Patient indicates they did not have Terbinafine for her at Pharmacy.  Place on left thumb less likely fungal and more likely due to trauma, recommend using Vaseline on and return if signs of redness swelling or pus.   - Start Oral Terbinafine for 3 months - Check Liver labs & CBC in 1 month

## 2020-12-16 NOTE — Progress Notes (Signed)
SUBJECTIVE:   CHIEF COMPLAINT / HPI: Distal Extremity Pain f/u/ Diabetes f/u  Patient indicates pain has not resolved since last visit.  Describes pain as pins and needles like feeling that sometimes results in weakness and occurs in all 4 distal extremities.  Was previously seen by Neurologist and Neurosurgeon and had MRI and Nerve tests that found no abnormality.  Patient indicates they were normal but unable to find in records.  Indicates they were told that pain is likely due to diabetes, though patient does not believe this is the case.  Indicates increasing Cymbalta to 60 mg also made depression worse.  Had dsome suicidal ideation she endorsed but no specific plan and was able to talk to therapist.  Indicates mood improved with taking every other day instead of daily.  Patient also indicates did not receive Terbinafine from Pharmacy for Onchomycosis of right big toe.  PERTINENT  PMH / PSH: Hypertension, Anxiety, Onchomycosis  OBJECTIVE:   BP (!) 144/80   Pulse 75   Ht 5\' 6"  (1.676 m)   Wt 205 lb 3.2 oz (93.1 kg)   LMP 09/11/2017 (Approximate)   SpO2 98%   BMI 33.12 kg/m    Physical Exam Constitutional:      General: She is not in acute distress.    Appearance: She is not ill-appearing.  HENT:     Head: Normocephalic and atraumatic.     Mouth/Throat:     Mouth: Mucous membranes are moist.  Cardiovascular:     Rate and Rhythm: Normal rate and regular rhythm.     Pulses: Normal pulses.  Pulmonary:     Effort: Pulmonary effort is normal.     Breath sounds: Normal breath sounds.  Musculoskeletal:        General: Normal range of motion.     Right lower leg: No edema.     Left lower leg: No edema.  Neurological:     Mental Status: She is alert.     Coordination: Coordination is intact.     Gait: Gait abnormal.     Comments: Walks with cane, weakness in rflexion of right hip. Upper extremities strength 5/5 bilaterally     ASSESSMENT/PLAN:   Diabetes type 2,  controlled (HCC) A1C had slightly increased to 7.4.  Unlikely to be cause of patient's neuropathy given overall good control. - Continue Jardiance - Repeat A1C in 3 months, if continues to increase will consider increasing medication or possibly starting GLP-1 therapy  Hypertension Patient blood pressure elevated again today at 144/80.  Currently on Lodsartan 50 mg dailky - Increase to Losartan 100 mg to maximize dosage given diabetes  Depressed mood Has good follow-up with therapist who she talks to weekly.  Worsened following increase to Cymbalta of 60 mg, but imoproved with switching to 60 mg every other day.  No SI at this time. - Decrease to Cymbalta 30 mg  Onychomycosis with ingrown toenail Patient indicates they did not have Terbinafine for her at Pharmacy.  Place on left thumb less likely fungal and more likely due to trauma, recommend using Vaseline on and return if signs of redness swelling or pus.   - Start Oral Terbinafine for 3 months - Check Liver labs & CBC in 1 month  Peripheral neuropathy Did not improve with increase in Cymbalta.  Less likely to be diabetic in origin given patient's relatively well controlled A1C. Patient has had extensive imaging and nerve studies so less likely MS or other myelinic disease, though need  to obtain records to confirm this.  B12 is low but not extremely low and patient takes oral supplement. Pain has not been resposive to highest doses of Gabapentin. Possible psychologic component or somatic symptom disorder.  Consented patient for obtaining records. - Obtain past records from Neurologist and Neurosurgeon - Refer to [facility] for further evaluation of pain/nerve symptoms     Delora Fuel, MD [place]

## 2020-12-16 NOTE — Assessment & Plan Note (Signed)
Has good follow-up with therapist who she talks to weekly.  Worsened following increase to Cymbalta of 60 mg, but imoproved with switching to 60 mg every other day.  No SI at this time. - Decrease to Cymbalta 30 mg

## 2020-12-16 NOTE — Assessment & Plan Note (Signed)
Did not improve with increase in Cymbalta.  Less likely to be diabetic in origin given patient's relatively well controlled A1C. Patient has had extensive imaging and nerve studies so less likely MS or other myelinic disease, though need to obtain records to confirm this.  B12 is low but not extremely low and patient takes oral supplement. Pain has not been resposive to highest doses of Gabapentin. Possible psychologic component or somatic symptom disorder.  Consented patient for obtaining records. - Obtain past records from Neurologist and Neurosurgeon - Refer to [facility] for further evaluation of pain/nerve symptoms

## 2020-12-16 NOTE — Assessment & Plan Note (Signed)
A1C had slightly increased to 7.4.  Unlikely to be cause of patient's neuropathy given overall good control. - Continue Jardiance - Repeat A1C in 3 months, if continues to increase will consider increasing medication or possibly starting GLP-1 therapy

## 2020-12-22 ENCOUNTER — Ambulatory Visit (INDEPENDENT_AMBULATORY_CARE_PROVIDER_SITE_OTHER): Payer: Medicaid Other

## 2020-12-22 ENCOUNTER — Other Ambulatory Visit: Payer: Self-pay

## 2020-12-22 DIAGNOSIS — Z23 Encounter for immunization: Secondary | ICD-10-CM | POA: Diagnosis not present

## 2021-01-06 DIAGNOSIS — F4310 Post-traumatic stress disorder, unspecified: Secondary | ICD-10-CM | POA: Diagnosis not present

## 2021-01-13 DIAGNOSIS — F4310 Post-traumatic stress disorder, unspecified: Secondary | ICD-10-CM | POA: Diagnosis not present

## 2021-01-20 DIAGNOSIS — F4310 Post-traumatic stress disorder, unspecified: Secondary | ICD-10-CM | POA: Diagnosis not present

## 2021-01-21 DIAGNOSIS — F4310 Post-traumatic stress disorder, unspecified: Secondary | ICD-10-CM | POA: Diagnosis not present

## 2021-01-27 DIAGNOSIS — F4310 Post-traumatic stress disorder, unspecified: Secondary | ICD-10-CM | POA: Diagnosis not present

## 2021-01-28 ENCOUNTER — Ambulatory Visit (INDEPENDENT_AMBULATORY_CARE_PROVIDER_SITE_OTHER): Payer: Medicaid Other | Admitting: Family Medicine

## 2021-01-28 ENCOUNTER — Encounter: Payer: Self-pay | Admitting: Family Medicine

## 2021-01-28 ENCOUNTER — Other Ambulatory Visit: Payer: Self-pay

## 2021-01-28 VITALS — BP 152/94 | HR 92 | Wt 205.0 lb

## 2021-01-28 DIAGNOSIS — Z79899 Other long term (current) drug therapy: Secondary | ICD-10-CM | POA: Diagnosis not present

## 2021-01-28 DIAGNOSIS — G609 Hereditary and idiopathic neuropathy, unspecified: Secondary | ICD-10-CM

## 2021-01-28 DIAGNOSIS — L600 Ingrowing nail: Secondary | ICD-10-CM

## 2021-01-28 DIAGNOSIS — K582 Mixed irritable bowel syndrome: Secondary | ICD-10-CM

## 2021-01-28 DIAGNOSIS — B351 Tinea unguium: Secondary | ICD-10-CM

## 2021-01-28 DIAGNOSIS — R29898 Other symptoms and signs involving the musculoskeletal system: Secondary | ICD-10-CM

## 2021-01-28 DIAGNOSIS — I10 Essential (primary) hypertension: Secondary | ICD-10-CM | POA: Diagnosis not present

## 2021-01-28 DIAGNOSIS — M792 Neuralgia and neuritis, unspecified: Secondary | ICD-10-CM | POA: Diagnosis not present

## 2021-01-28 MED ORDER — AMLODIPINE BESYLATE 5 MG PO TABS: 5.0000 mg | ORAL_TABLET | Freq: Every day | ORAL | 0 refills | Status: DC

## 2021-01-28 MED ORDER — METAMUCIL SMOOTH TEXTURE 58.6 % PO POWD: 1.0000 | Freq: Three times a day (TID) | ORAL | 12 refills | Status: DC

## 2021-01-28 NOTE — Patient Instructions (Signed)
It was good to see you today.  Thank you for coming in.  I recommend taking Psyllium for your abdominal discomfort.  I also recommend starting Amlodipine 5 mg to take every day to bring your blood pressure down further.  We are also checking labs today for your new medications.     I would like to see you again in 2 months unless there are any issues.  Be Well, Dr Manus Rudd

## 2021-01-29 LAB — COMPREHENSIVE METABOLIC PANEL
ALT: 38 IU/L — ABNORMAL HIGH (ref 0–32)
AST: 42 IU/L — ABNORMAL HIGH (ref 0–40)
Albumin/Globulin Ratio: 1.5 (ref 1.2–2.2)
Albumin: 4.4 g/dL (ref 3.8–4.8)
Alkaline Phosphatase: 87 IU/L (ref 44–121)
BUN/Creatinine Ratio: 14 (ref 9–23)
BUN: 7 mg/dL (ref 6–24)
Bilirubin Total: 0.2 mg/dL (ref 0.0–1.2)
CO2: 19 mmol/L — ABNORMAL LOW (ref 20–29)
Calcium: 9.4 mg/dL (ref 8.7–10.2)
Chloride: 103 mmol/L (ref 96–106)
Creatinine, Ser: 0.51 mg/dL — ABNORMAL LOW (ref 0.57–1.00)
Globulin, Total: 3 g/dL (ref 1.5–4.5)
Glucose: 163 mg/dL — ABNORMAL HIGH (ref 65–99)
Potassium: 4 mmol/L (ref 3.5–5.2)
Sodium: 140 mmol/L (ref 134–144)
Total Protein: 7.4 g/dL (ref 6.0–8.5)
eGFR: 119 mL/min/{1.73_m2} (ref >59)

## 2021-01-30 DIAGNOSIS — K589 Irritable bowel syndrome without diarrhea: Secondary | ICD-10-CM | POA: Insufficient documentation

## 2021-01-30 NOTE — Assessment & Plan Note (Addendum)
Patient indicates has been taking terbinafine.  Checking liver labs today. - Obtain CMP - Continue daily Terbinafine - follow up in 2 months

## 2021-01-30 NOTE — Assessment & Plan Note (Signed)
No changes since previous exam.  Currently on Cymbalta 30 mg without improvement in pain.  Previously placed referral to [facility] as feel we have exhausted options for patient (see previous notes) but declined referral as patient has previously been seen by [place] referral and clarified patient wants to be seen by new Neurology group

## 2021-01-30 NOTE — Progress Notes (Signed)
    SUBJECTIVE:   CHIEF COMPLAINT / HPI: Onychomycosis on terbinafine  Oatient indicates toe nail looking improved but nail still thickened with redness and mild pain.  Indicates able to get Terbinafine and has been taking daily.    Denies any improvement in worsening of neurological symptoms and did not hear back from [facility].    Also endorses abdominal pain and what feels like "rocks in stomach."  Indicates thuis has been going on intermittently throughout life, but last episode lasted longer for up to 1 week.  Indicates has had liquid and "soft-served" stools that have caused burning with expulsion.  Indicates this does not feel like constipation.  Denies any blood.    PERTINENT  PMH / PSH: Hypertension, Type 2 Diabetes  OBJECTIVE:   BP (!) 152/94   Pulse 92   Wt 205 lb (93 kg)   LMP 09/11/2017 (Approximate)   SpO2 99%   BMI 33.09 kg/m    Physical Exam Constitutional:      Appearance: Normal appearance.  HENT:     Head: Normocephalic and atraumatic.  Cardiovascular:     Rate and Rhythm: Normal rate and regular rhythm.  Pulmonary:     Effort: Pulmonary effort is normal.     Breath sounds: Normal breath sounds.  Abdominal:     General: Abdomen is flat.     Palpations: Abdomen is soft.  Skin:    General: Skin is warm.  Neurological:     Mental Status: She is alert.     Motor: Weakness present.     Comments: Weakness in right leg, ambulates with cane     ASSESSMENT/PLAN:   Onychomycosis with ingrown toenail Patient indicates has been taking terbinafine.  Checking liver labs today. - Obtain CMP - Continue daily Terbinafine - follow up in 2 months  Hypertension Patient blood pressure elevated again today at 152/94.  Currently on Losartan 100 mg daily.   - Continue Losartan 100 mg, add on Amlodipine 5 mg  Peripheral neuropathy No changes since previous exam.  Currently on Cymbalta 30 mg without improvement in pain.  Previously placed referral to [facility]  Neurology as feel we have exhausted options for patient (see previous notes) but declined referral as patient has previously been seen by [place] referral and clarified patient wants to be seen by new Neurology group  IBS (irritable bowel syndrome) Likely cause of symptoms as intermittent.  No concerning findings of blood in stool or peritoneal signs on Physical Exam.  Will try Psyllium PRN given mixed symptoms.   - Prescribed Psyllium PRN for symptoms     Delora Fuel, MD [place]

## 2021-01-30 NOTE — Assessment & Plan Note (Signed)
Patient blood pressure elevated again today at 152/94.  Currently on Losartan 100 mg daily.   - Continue Losartan 100 mg, add on Amlodipine 5 mg

## 2021-01-30 NOTE — Assessment & Plan Note (Signed)
Likely cause of symptoms as intermittent.  No concerning findings of blood in stool or peritoneal signs on Physical Exam.  Will try Psyllium PRN given mixed symptoms.   - Prescribed Psyllium PRN for symptoms

## 2021-02-03 DIAGNOSIS — F4310 Post-traumatic stress disorder, unspecified: Secondary | ICD-10-CM | POA: Diagnosis not present

## 2021-02-04 ENCOUNTER — Encounter: Payer: Self-pay | Admitting: Neurology

## 2021-02-05 ENCOUNTER — Ambulatory Visit: Payer: Medicaid Other

## 2021-02-05 ENCOUNTER — Ambulatory Visit
Admission: RE | Admit: 2021-02-05 | Discharge: 2021-02-05 | Disposition: A | Discharge status: Home / Self Care | Payer: Medicaid Other | Source: Ambulatory Visit | Attending: Obstetrics and Gynecology | Admitting: Obstetrics and Gynecology

## 2021-02-05 ENCOUNTER — Other Ambulatory Visit: Payer: Self-pay

## 2021-02-05 DIAGNOSIS — N644 Mastodynia: Secondary | ICD-10-CM | POA: Diagnosis not present

## 2021-02-05 DIAGNOSIS — R922 Inconclusive mammogram: Secondary | ICD-10-CM | POA: Diagnosis not present

## 2021-02-10 DIAGNOSIS — F4310 Post-traumatic stress disorder, unspecified: Secondary | ICD-10-CM | POA: Diagnosis not present

## 2021-02-17 DIAGNOSIS — F4310 Post-traumatic stress disorder, unspecified: Secondary | ICD-10-CM | POA: Diagnosis not present

## 2021-04-01 DIAGNOSIS — Z0271 Encounter for disability determination: Secondary | ICD-10-CM

## 2021-04-30 ENCOUNTER — Ambulatory Visit: Payer: Medicaid Other | Admitting: Neurology

## 2021-04-30 ENCOUNTER — Other Ambulatory Visit: Payer: Self-pay

## 2021-04-30 ENCOUNTER — Encounter: Payer: Self-pay | Admitting: Neurology

## 2021-04-30 VITALS — BP 156/98 | HR 95 | Ht 66.0 in | Wt 202.0 lb

## 2021-04-30 DIAGNOSIS — Z9889 Other specified postprocedural states: Secondary | ICD-10-CM

## 2021-04-30 DIAGNOSIS — R202 Paresthesia of skin: Secondary | ICD-10-CM

## 2021-04-30 NOTE — Patient Instructions (Signed)
We will request records from [place].  Once reviewed, we will let you know.

## 2021-04-30 NOTE — Progress Notes (Signed)
[name] - Initial Visit   Date: 04/30/21  [name] MRN: [id number] DOB: 02-Nov-1980   Dear Dr. Manus Rudd:  Thank you for your kind referral of [name] for consultation of generalized paresthesias. Although her history is well known to you, please allow [place] to reiterate it for the purpose of our medical record. The patient was accompanied to the clinic by self.    History of Present Illness: [name] is a 40 y.o. right-handed female with hypertension, type 2 diabetes mellitus, GERD, IBS, anxiety, s/p lumbar surgery at L5-S1 (2021), chronic low back pain, vitamin B12 deficiency, tobacco use, and migraines presenting for evaluation of neuropathy.   She has chronic issues with pain since her 60s.  She was previously seeing Dr. Leta Baptist for diabetic neuropathy and tried on gabapentin.  She did not have NCS/EMG.  In March 2021, she had lumbar surgery at L5-S1 by Dr. Saintclair Halsted.  Following surgery, she developed right foot numbness which extended into the left arm and now into both feet and hands. She had EDX by Dr. Brien Few which was reportedly normal and MRI cervical spine which did not find structural pathology to explain her symptoms.  She is here for a second opinion for her ongoing numbness in the feet and hands.  She was seeing Dr. Brien Few for pain management. She has tried gabapentin, Cymbalta, and ESI without improvement.   She is not working and last worked in [employer] in 2020.  She lives at home with husband and children.  Out-side paper records, electronic medical record, and images have been reviewed where available and summarized as:  MRI brain wwo contrast 06/01/2017: Unremarkable MRI scan of the brain without contrast. No significant change compared with MRI 10/08/13  MRI lumbar spine wo contrast 05/31/2017: Abnormal MRI scan lumbar spine showing mild spondylitic changes mostly at L4-5 and L5-S1 with prominent facet  hypertrophy and bilateral mild foraminal narrowing without definite encroachment on the nerve roots.   Lab Results  Component Value Date   HGBA1C 7.4 (A) 12/12/2020   Lab Results  Component Value Date   QMGQQPYP95 093 (L) 09/05/2020   Lab Results  Component Value Date   TSH 1.710 05/03/2017   No results found for: ESRSEDRATE, POCTSEDRATE  Past Medical History:  Diagnosis Date   Abdominal lipoma 05/2015   Abdominal pain, RLQ 10/03/2017   Acute medial meniscus tear of right knee 06/09/2019   Anxiety    Arthritis    lower back   DM type 2 with diabetic peripheral neuropathy (HCC)    Borderline   GERD (gastroesophageal reflux disease)    Headache    Migraines   History of MRSA infection ~ 2006   leg   History of shingles    Hx of hydronephrosis    following total hysterectomy surgery ; Right kidney ; see CT abdomen 10-02-17 epic; states " a plug had got damaged during the surgery so thats why they put the stent in"    Hypertension    under control with med., has been on med. x 6 mos.   Lumbosacral spondylolysis    see MRI 06-01-17 epic    Mood swing    Optic nerve swelling    seen by Dr Mora Bellman 09-07-17, did lumbar puncture 09-19-17; patient states " they never called me about results ut i have an appt with him in april"; patient also states  " my vision is still blurry and worse when i get the  migraines, they think the migraines are causing it"   Psoriasis     Past Surgical History:  Procedure Laterality Date   CESAREAN SECTION  06/21/2008; 08/18/2010   CESAREAN SECTION N/A 07/10/2014   Procedure: REPEAT CESAREAN SECTION;  Surgeon: Daria Pastures, MD;  Location: [facility];  Service: Obstetrics;  Laterality: N/A;   CHOLECYSTECTOMY  07/10/2008   CYSTOSCOPY N/A 09/28/2017   Procedure: CYSTOSCOPY;  Surgeon: Bobbye Charleston, MD;  Location: [facility];  Service: Gynecology;  Laterality: N/A;   CYSTOSCOPY Right 01/18/2018   Procedure: CYSTOSCOPY, RETROGRADE / PYELOGRAM, URETEROSCOPY;   Surgeon: Ceasar Mons, MD;  Location: [facility];  Service: Urology;  Laterality: Right;   CYSTOSCOPY W/ URETERAL STENT PLACEMENT Right 10/03/2017   Procedure: CYSTOSCOPY WITH RETROGRADE PYELOGRAM/URETERAL STENT PLACEMENT;  Surgeon: Ceasar Mons, MD;  Location: [facility];  Service: Urology;  Laterality: Right;   CYSTOSCOPY W/ URETERAL STENT PLACEMENT Right 02/22/2018   Procedure: CYSTOSCOPY WITH URETERAL STENT PLACEMENT;  Surgeon: Ceasar Mons, MD;  Location: [facility];  Service: Urology;  Laterality: Right;   KNEE ARTHROSCOPY WITH MEDIAL MENISECTOMY Right 06/26/2019   Procedure: KNEE ARTHROSCOPY WITH PARTIAL MEDIAL MENISECTOMY, REMOVAL LOOSE BODY;  Surgeon: Elsie Saas, MD;  Location: [facility];  Service: Orthopedics;  Laterality: Right;   LIPOMA EXCISION  2014   back   LIPOMA EXCISION N/A 05/21/2015   Procedure: EXCISION LIPOMAS ON ABDOMEN;  Surgeon: Erroll Luna, MD;  Location: [facility];  Service: General;  Laterality: N/A;   LUMBAR LAMINECTOMY/DECOMPRESSION MICRODISCECTOMY Right 01/23/2020   Procedure: Microdiscectomy right - Lumbar five-Sacral one;  Surgeon: Kary Kos, MD;  Location: [facility];  Service: Neurosurgery;  Laterality: Right;   LUMBAR PUNCTURE  09/19/17   [facility], Dr. Curt Bears   OOPHORECTOMY Left 09/28/2017   Procedure: OOPHORECTOMY;  Surgeon: Bobbye Charleston, MD;  Location: [facility];  Service: Gynecology;  Laterality: Left;   ROBOTIC ASSISTED LAPAROSCOPIC LYSIS OF ADHESION N/A 09/28/2017   Procedure: ROBOTIC ASSISTED LAPAROSCOPIC LYSIS OF ADHESION;  Surgeon: Bobbye Charleston, MD;  Location: [facility];  Service: Gynecology;  Laterality: N/A;   ROBOTIC ASSISTED TOTAL HYSTERECTOMY WITH BILATERAL SALPINGO OOPHERECTOMY Bilateral 09/28/2017   Procedure: ROBOTIC ASSISTED TOTAL HYSTERECTOMY WITH BILATERAL SALPINGECTOMY;  Surgeon: Bobbye Charleston, MD;  Location: [facility];  Service: Gynecology;   Laterality: Bilateral;   ROBOTICALLY ASSISTED LAPAROSCOPIC URETERAL RE-IMPLANTATION Right 02/22/2018   Procedure: ROBOTICALLY ASSISTED LAPAROSCOPIC URETERAL RE-IMPLANTATION;  Surgeon: Ceasar Mons, MD;  Location: [facility];  Service: Urology;  Laterality: Right;  ONLY NEEDS 240 MIN TOTAL FOR ALL PROCEDURES   TUBAL LIGATION     WISDOM TOOTH EXTRACTION       Medications:  Outpatient Encounter Medications as of 04/30/2021  Medication Sig   acetaminophen (TYLENOL) 650 MG CR tablet Take 1,300 mg by mouth 5 (five) times daily.   amLODipine (NORVASC) 5 MG tablet Take 1 tablet (5 mg total) by mouth at bedtime.   DULoxetine (CYMBALTA) 30 MG capsule Take 1 capsule (30 mg total) by mouth daily.   empagliflozin (JARDIANCE) 10 MG TABS tablet Take 1 tablet (10 mg total) by mouth daily.   losartan (COZAAR) 100 MG tablet Take 1 tablet (100 mg total) by mouth at bedtime.   psyllium (METAMUCIL SMOOTH TEXTURE) 58.6 % powder Take 1 packet by mouth 3 (three) times daily.   rosuvastatin (CRESTOR) 40 MG tablet Take 1 tablet (40 mg total) by mouth daily.   SUMAtriptan (IMITREX) 50 MG tablet Take 1 tablet (  50 mg total) by mouth every 2 (two) hours as needed for migraine. May repeat in 2 hours if headache persists or recurs.   terbinafine (LAMISIL) 250 MG tablet Take 1 tablet (250 mg total) by mouth daily.   No facility-administered encounter medications on file as of 04/30/2021.    Allergies:  Allergies  Allergen Reactions   Bee Venom Anaphylaxis   Penicillins Anaphylaxis and Other (See Comments)    Has patient had a PCN reaction causing immediate rash, facial/tongue/throat swelling, SOB or lightheadedness with hypotension: Yes Has patient had a PCN reaction causing severe rash involving mucus membranes or skin necrosis: No Has patient had a PCN reaction that required hospitalization: Yes Has patient had a PCN reaction occurring within the last 10 years: No If all of the above answers are "NO", then  may proceed with Cephalosporin use.    Mushroom Extract Complex Nausea And Vomiting   Ciprofloxacin Rash   Powder Itching, Rash and Other (See Comments)    The powder from latex gloves    Family History: Family History  Problem Relation Age of Onset   Drug abuse Mother    Heart disease Maternal Grandmother    Heart disease Maternal Grandfather     Social History: Social History   Tobacco Use   Smoking status: Some Days    Packs/day: 0.50    Years: 16.00    Pack years: 8.00    Types: Cigarettes    Start date: 05/01/2014   Smokeless tobacco: Never  Vaping Use   Vaping Use: Never used  Substance Use Topics   Alcohol use: No    Alcohol/week: 0.0 standard drinks   Drug use: No   Social History   Social History Narrative   Live home with husband and 3 kids.   Education HS grad.  Culinary grad.  Caffeine  8 glasses (sweet tea), some soda's occ.  Working: [employer].      Vital Signs:  BP (!) 156/98   Pulse 95   Ht 5\' 6"  (1.676 m)   Wt 202 lb (91.6 kg)   LMP 09/11/2017 (Approximate)   SpO2 98%   BMI 32.60 kg/m    Neurological Exam: MENTAL STATUS including orientation to time, place, person, recent and remote memory, attention span and concentration, language, and fund of knowledge is normal.  Speech is not dysarthric.  CRANIAL NERVES: II:  No visual field defects.   III-IV-VI: Pupils equal round and reactive to light.  Normal conjugate, extra-ocular eye movements in all directions of gaze.  No nystagmus.  No ptosis.   V:  Normal facial sensation.    VII:  Normal facial symmetry and movements.   VIII:  Normal hearing and vestibular function.   IX-X:  Normal palatal movement.   XI:  Normal shoulder shrug and head rotation.   XII:  Normal tongue strength and range of motion, no deviation or fasciculation.  MOTOR:  No atrophy, fasciculations or abnormal movements.  No pronator drift.   Upper Extremity:  Right  Left  Deltoid  5/5   5/5   Biceps  5/5   5/5   Triceps   5/5   5/5   Infraspinatus 5/5  5/5  Medial pectoralis 5/5  5/5  Wrist extensors  5/5   5/5   Wrist flexors  5/5   5/5   Finger extensors  5/5   5/5   Finger flexors  5/5   5/5   Dorsal interossei  5/5   5/5  Abductor pollicis  5/5   5/5   Tone (Ashworth scale)  0  0   Lower Extremity:  Right  Left  Hip flexors  5/5   5/5   Hip extensors  5/5   5/5   Adductor 5/5  5/5  Abductor 5/5  5/5  Knee flexors  5/5   5/5   Knee extensors  5/5   5/5   Dorsiflexors  5/5   5/5   Plantarflexors  5/5   5/5   Toe extensors  5/5   5/5   Toe flexors  5/5   5/5   Tone (Ashworth scale)  0  0   MSRs:  Right        Left                  brachioradialis 2+  2+  biceps 2+  2+  triceps 2+  2+  patellar 2+  2+  ankle jerk 1+  2+  Hoffman no  no  plantar response down  down   SENSORY:  Vibration absent in the hands, reduced at the ankles.  Pin prick is intact throughout. Temperature reduced over the right lower leg.    COORDINATION/GAIT: Normal finger-to- nose-finger.  Gait assisted with a cane, appears antalgic with mild circumduction of the right leg.  IMPRESSION: Generalized paresthesias of the hands and feet.  Exam does not clearly support neuropathy as there is a patchy sensory loss in the limbs. Explained to patient that upper extremity symptoms would  not be related to her back surgery, as the nerve to her arms have already branched off in the cervical region and do not involves the lumbar segment.  It would be helpful to review her prior NCS/EMG to see if there was evidence of neuropathy.  I will request MRI cervical spine, MRI lumbar spine, NCS/EMG and the last Andrade from Drs. Saintclair Halsted and [place].  Her leg symptoms may be related to her lumbar degenerative disease and management would be symptomatic.  She is aware that I do not manage chronic pain. Once I have reviewed her prior records, I will provide further recommendations.  Total time spent: 45 min   Thank you for allowing me to participate  in patient's care.  If I can answer any additional questions, I would be pleased to do so.    Sincerely,    Verda Mehta K. Posey Pronto, DO

## 2021-05-19 DIAGNOSIS — F4312 Post-traumatic stress disorder, chronic: Secondary | ICD-10-CM | POA: Diagnosis not present

## 2021-05-26 DIAGNOSIS — F4312 Post-traumatic stress disorder, chronic: Secondary | ICD-10-CM | POA: Diagnosis not present

## 2021-06-02 DIAGNOSIS — F4312 Post-traumatic stress disorder, chronic: Secondary | ICD-10-CM | POA: Diagnosis not present

## 2021-06-05 ENCOUNTER — Other Ambulatory Visit: Payer: Self-pay

## 2021-06-05 ENCOUNTER — Ambulatory Visit: Payer: Medicaid Other | Admitting: Family Medicine

## 2021-06-05 VITALS — BP 138/80 | HR 90 | Ht 66.0 in | Wt 202.2 lb

## 2021-06-05 DIAGNOSIS — E119 Type 2 diabetes mellitus without complications: Secondary | ICD-10-CM

## 2021-06-05 DIAGNOSIS — R4589 Other symptoms and signs involving emotional state: Secondary | ICD-10-CM

## 2021-06-05 DIAGNOSIS — G609 Hereditary and idiopathic neuropathy, unspecified: Secondary | ICD-10-CM | POA: Diagnosis not present

## 2021-06-05 DIAGNOSIS — I10 Essential (primary) hypertension: Secondary | ICD-10-CM

## 2021-06-05 LAB — POCT GLYCOSYLATED HEMOGLOBIN (HGB A1C): HbA1c, POC (controlled diabetic range): 7.4 % — AB (ref 0.0–7.0)

## 2021-06-05 MED ORDER — FLUOXETINE HCL 20 MG PO CAPS: 20.0000 mg | ORAL_CAPSULE | Freq: Every day | ORAL | 0 refills | Status: DC

## 2021-06-05 NOTE — Patient Instructions (Signed)
It was good to see you today.  Thank you for coming in.  I have confirmed that the records request was filed.  I recommend calling [facility] on Monday to follow-up and get an appointmebnt for hand numbness.  I am restarting your Prozac.  I will prescribe 20 mg for the first 2 weeks and prescribe 40 mg to take after this 2 week period.  Be Well, Dr Manus Rudd

## 2021-06-05 NOTE — Progress Notes (Signed)
    SUBJECTIVE:   CHIEF COMPLAINT / HPI: Diabetes follow-up  Patient here for follow-up A1C and blood pressure check.  Has had worsening symptoms with Peripheral Neuropathy.  Indicates waiting to hear back from [facility] for follow-up appointment.  Has siad they are still waiting on previous imaging records from [facility].  Patient indicates has been feeling more depressed lately.  Requesting to restart Prozac.  PERTINENT  PMH / PSH: HTN, Type 2 Diabetes  OBJECTIVE:   BP 138/80   Pulse 90   Ht '5\' 6"'$  (1.676 m)   Wt 202 lb 3.2 oz (91.7 kg)   LMP 09/11/2017 (Approximate)   SpO2 98%   BMI 32.64 kg/m    Physical Exam HENT:     Head: Normocephalic and atraumatic.     Mouth/Throat:     Mouth: Mucous membranes are moist.  Cardiovascular:     Rate and Rhythm: Normal rate and regular rhythm.  Pulmonary:     Effort: Pulmonary effort is normal.     Breath sounds: Normal breath sounds.  Skin:    General: Skin is warm.  Neurological:     Mental Status: She is alert and oriented to person, place, and time. Mental status is at baseline.     Gait: Gait abnormal.     Comments: Ambulates well with cain     ASSESSMENT/PLAN:   Depressed mood PHQ9 score of 20.  Negative for question 9.  Previously had good improvement with Fluoxetine 40 mg.  This was stopped due to trial of Cymbalta for Fibromyalgia pain that was unsuccessful.   - Restart Fluoxetine 20 mg for 2 weeks, if no issues restart Fluoxetine 40 mg after 2 week period  Peripheral neuropathy Indicates numbness in hands has become constant.  Currently being seen by [facility].  Was told they are awaiting records from [facility] for MRI and nerve conduction studies.  The form appears to have been faxed on 05/18/21. - Call [facility] on Monday given worsening symptoms for appointment and update on records  Diabetes type 2, controlled ([place]) A1C stable at 7.4. - Continue current medication  regimen - follow up in 3 months  Hypertension Greatly improved today at 138/80. - Continue Losartan and Amlodipine at current doses       Delora Fuel, MD [place]

## 2021-06-06 MED ORDER — FLUOXETINE HCL 40 MG PO CAPS: 40.0000 mg | ORAL_CAPSULE | Freq: Every day | ORAL | 3 refills | Status: DC

## 2021-06-06 NOTE — Assessment & Plan Note (Addendum)
A1C stable at 7.4. - Continue current medication regimen - follow up in 3 months

## 2021-06-06 NOTE — Assessment & Plan Note (Signed)
Greatly improved today at 138/80. - Continue Losartan and Amlodipine at current doses

## 2021-06-06 NOTE — Assessment & Plan Note (Signed)
Indicates numbness in hands has become constant.  Currently being seen by [facility].  Was told they are awaiting records from [facility] for MRI and nerve conduction studies.  The form appears to have been faxed on 05/18/21. - Call [facility] on Monday given worsening symptoms for appointment and update on records

## 2021-06-06 NOTE — Assessment & Plan Note (Addendum)
PHQ9 score of 20.  Negative for question 9.  Previously had good improvement with Fluoxetine 40 mg.  This was stopped due to trial of Cymbalta for Fibromyalgia pain that was unsuccessful.   - Restart Fluoxetine 20 mg for 2 weeks, if no issues restart Fluoxetine 40 mg after 2 week period

## 2021-06-09 DIAGNOSIS — F4312 Post-traumatic stress disorder, chronic: Secondary | ICD-10-CM | POA: Diagnosis not present

## 2021-06-15 ENCOUNTER — Telehealth: Payer: Self-pay | Admitting: Neurology

## 2021-06-15 NOTE — Telephone Encounter (Signed)
Patient called wanting to know if Dr. Posey Pronto has received the medical records from the request that she completed.

## 2021-06-16 DIAGNOSIS — F4312 Post-traumatic stress disorder, chronic: Secondary | ICD-10-CM | POA: Diagnosis not present

## 2021-06-16 NOTE — Telephone Encounter (Signed)
I have not received any records, please refax request. Thanks.

## 2021-06-18 NOTE — Telephone Encounter (Signed)
Release has been refaxed to [facility].

## 2021-06-22 DIAGNOSIS — S83242A Other tear of medial meniscus, current injury, left knee, initial encounter: Secondary | ICD-10-CM | POA: Diagnosis not present

## 2021-06-22 DIAGNOSIS — S83241A Other tear of medial meniscus, current injury, right knee, initial encounter: Secondary | ICD-10-CM | POA: Diagnosis not present

## 2021-06-23 DIAGNOSIS — F4312 Post-traumatic stress disorder, chronic: Secondary | ICD-10-CM | POA: Diagnosis not present

## 2021-06-30 DIAGNOSIS — F4312 Post-traumatic stress disorder, chronic: Secondary | ICD-10-CM | POA: Diagnosis not present

## 2021-07-05 ENCOUNTER — Emergency Department (HOSPITAL_COMMUNITY): Payer: Medicaid Other

## 2021-07-05 ENCOUNTER — Other Ambulatory Visit: Payer: Self-pay

## 2021-07-05 ENCOUNTER — Emergency Department (HOSPITAL_COMMUNITY)
Admission: EM | Admit: 2021-07-05 | Discharge: 2021-07-05 | Disposition: A | Discharge status: Home / Self Care | Payer: Medicaid Other | Attending: Emergency Medicine | Admitting: Emergency Medicine

## 2021-07-05 ENCOUNTER — Encounter (HOSPITAL_COMMUNITY): Payer: Self-pay

## 2021-07-05 DIAGNOSIS — S39012A Strain of muscle, fascia and tendon of lower back, initial encounter: Secondary | ICD-10-CM | POA: Diagnosis not present

## 2021-07-05 DIAGNOSIS — I10 Essential (primary) hypertension: Secondary | ICD-10-CM | POA: Diagnosis not present

## 2021-07-05 DIAGNOSIS — E1140 Type 2 diabetes mellitus with diabetic neuropathy, unspecified: Secondary | ICD-10-CM | POA: Diagnosis not present

## 2021-07-05 DIAGNOSIS — F17210 Nicotine dependence, cigarettes, uncomplicated: Secondary | ICD-10-CM | POA: Diagnosis not present

## 2021-07-05 DIAGNOSIS — X58XXXA Exposure to other specified factors, initial encounter: Secondary | ICD-10-CM | POA: Diagnosis not present

## 2021-07-05 DIAGNOSIS — T148XXA Other injury of unspecified body region, initial encounter: Secondary | ICD-10-CM

## 2021-07-05 DIAGNOSIS — Z79899 Other long term (current) drug therapy: Secondary | ICD-10-CM | POA: Diagnosis not present

## 2021-07-05 DIAGNOSIS — R200 Anesthesia of skin: Secondary | ICD-10-CM | POA: Diagnosis not present

## 2021-07-05 DIAGNOSIS — M545 Low back pain, unspecified: Secondary | ICD-10-CM

## 2021-07-05 DIAGNOSIS — S3992XA Unspecified injury of lower back, initial encounter: Secondary | ICD-10-CM | POA: Diagnosis present

## 2021-07-05 LAB — CBC WITH DIFFERENTIAL/PLATELET
Abs Immature Granulocytes: 0.07 10*3/uL (ref 0.00–0.07)
Basophils Absolute: 0.1 10*3/uL (ref 0.0–0.1)
Basophils Relative: 1 %
Eosinophils Absolute: 0.3 10*3/uL (ref 0.0–0.5)
Eosinophils Relative: 2 %
HCT: 43.9 % (ref 36.0–46.0)
Hemoglobin: 14.3 g/dL (ref 12.0–15.0)
Immature Granulocytes: 1 %
Lymphocytes Relative: 27 %
Lymphs Abs: 3.9 10*3/uL (ref 0.7–4.0)
MCH: 28.6 pg (ref 26.0–34.0)
MCHC: 32.6 g/dL (ref 30.0–36.0)
MCV: 87.8 fL (ref 80.0–100.0)
Monocytes Absolute: 0.7 10*3/uL (ref 0.1–1.0)
Monocytes Relative: 5 %
Neutro Abs: 9.3 10*3/uL — ABNORMAL HIGH (ref 1.7–7.7)
Neutrophils Relative %: 64 %
Platelets: 284 10*3/uL (ref 150–400)
RBC: 5 MIL/uL (ref 3.87–5.11)
RDW: 15.2 % (ref 11.5–15.5)
WBC: 14.3 10*3/uL — ABNORMAL HIGH (ref 4.0–10.5)
nRBC: 0 % (ref 0.0–0.2)

## 2021-07-05 LAB — BASIC METABOLIC PANEL
Anion gap: 8 (ref 5–15)
BUN: 17 mg/dL (ref 6–20)
CO2: 23 mmol/L (ref 22–32)
Calcium: 9.5 mg/dL (ref 8.9–10.3)
Chloride: 109 mmol/L (ref 98–111)
Creatinine, Ser: <0.3 mg/dL — ABNORMAL LOW (ref 0.44–1.00)
Glucose, Bld: 131 mg/dL — ABNORMAL HIGH (ref 70–99)
Potassium: 4.1 mmol/L (ref 3.5–5.1)
Sodium: 140 mmol/L (ref 135–145)

## 2021-07-05 LAB — I-STAT BETA HCG BLOOD, ED (MC, WL, AP ONLY): I-stat hCG, quantitative: <5 m[IU]/mL (ref <5)

## 2021-07-05 MED ORDER — METHOCARBAMOL 500 MG PO TABS: 1000.0000 mg | ORAL_TABLET | Freq: Two times a day (BID) | ORAL | 0 refills | Status: AC | PRN

## 2021-07-05 MED ORDER — HYDROCODONE-ACETAMINOPHEN 5-325 MG PO TABS: 1.0000 | ORAL_TABLET | ORAL | 0 refills | Status: DC | PRN

## 2021-07-05 MED ORDER — METHOCARBAMOL 500 MG PO TABS
1000.0000 mg | ORAL_TABLET | Freq: Once | ORAL | Status: AC
  Administered 2021-07-05: 1000 mg via ORAL
  Filled 2021-07-05: qty 2

## 2021-07-05 MED ORDER — LIDOCAINE 5 % EX PTCH
1.0000 | MEDICATED_PATCH | Freq: Once | CUTANEOUS | Status: DC
  Administered 2021-07-05: 1 via TRANSDERMAL
  Filled 2021-07-05: qty 1

## 2021-07-05 MED ORDER — LIDOCAINE 5 % EX PTCH: 1.0000 | MEDICATED_PATCH | CUTANEOUS | 0 refills | Status: AC

## 2021-07-05 MED ORDER — HYDROCODONE-ACETAMINOPHEN 5-325 MG PO TABS
1.0000 | ORAL_TABLET | Freq: Once | ORAL | Status: AC
  Administered 2021-07-05: 1 via ORAL
  Filled 2021-07-05: qty 1

## 2021-07-05 MED ORDER — KETOROLAC TROMETHAMINE 15 MG/ML IJ SOLN
15.0000 mg | Freq: Once | INTRAMUSCULAR | Status: AC
  Administered 2021-07-05: 15 mg via INTRAVENOUS
  Filled 2021-07-05: qty 1

## 2021-07-05 NOTE — ED Notes (Signed)
Ambulatory to CT with steady gait.

## 2021-07-05 NOTE — ED Triage Notes (Signed)
Pt reports worsening back and neck pain over the last week. Pt has hx of back surgery last year.

## 2021-07-05 NOTE — ED Provider Notes (Signed)
[facility] DEPT Provider Note   CSN: [id number] Arrival date & time: 07/05/21  0700     History Chief Complaint  Patient presents with   Back Pain    [name] is a 40 y.o. female.  40 yo female with history as above to ED 2/2 back pain. Pt with history of prior lumbar spine surgery with NSGY. She has ongoing neuropathy following the surgery. Has been evaluated by the pain medicine in the past without intervention per the patient. She also follows with [facility] neurology. She has been taking Motrin intermittently without significant improvement to her overall pain. She denies fevers, chills, nausea or vomiting. No change to bowel/bladder function. No recent spinal injections or trauma. Feels the pain is similar to her chronic pain that she has been having over the past 12 mos +, feels it is gradually worsening. She takes her husbands back pain medicine on occasion but does not have any of her own to take per the pt.   The history is provided by the patient. No language interpreter was used.  Back Pain Associated symptoms: numbness   Associated symptoms: no abdominal pain, no chest pain, no dysuria, no fever and no headaches       Past Medical History:  Diagnosis Date   Abdominal lipoma 05/2015   Abdominal pain, RLQ 10/03/2017   Acute medial meniscus tear of right knee 06/09/2019   Anxiety    Arthritis    lower back   DM type 2 with diabetic peripheral neuropathy (HCC)    Borderline   GERD (gastroesophageal reflux disease)    Headache    Migraines   History of MRSA infection ~ 2006   leg   History of shingles    Hx of hydronephrosis    following total hysterectomy surgery ; Right kidney ; see CT abdomen 10-02-17 epic; states " a plug had got damaged during the surgery so thats why they put the stent in"    Hypertension    under control with med., has been on med. x 6 mos.   Lumbosacral spondylolysis    see MRI 06-01-17 epic    Mood swing     Optic nerve swelling    seen by Dr Mora Bellman 09-07-17, did lumbar puncture 09-19-17; patient states " they never called me about results ut i have an appt with him in april"; patient also states  " my vision is still blurry and worse when i get the migraines, they think the migraines are causing it"   Psoriasis     Patient Active Problem List   Diagnosis Date Noted   IBS (irritable bowel syndrome) 01/30/2021   Onychomycosis with ingrown toenail 11/07/2020   Depressed mood 04/17/2020   HNP (herniated nucleus pulposus), lumbar 01/23/2020   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   Hyperlipidemia 01/20/2018   Tobacco use disorder 01/19/2018   Anxiety state 01/17/2018   Anemia 06/20/2017   Diabetes type 2, controlled ([place]) 05/16/2017   H/O: C-section 05/16/2017   Vitamin B12 deficiency 05/16/2017   Peripheral neuropathy 03/23/2017   Psoriasis 10/01/2013   Hypertension 09/26/2012   Obesity 09/26/2012   Subcutaneous nodule 09/26/2012   History of abnormal Pap smear 09/26/2012    Past Surgical History:  Procedure Laterality Date   CESAREAN SECTION  06/21/2008; 08/18/2010   CESAREAN SECTION N/A 07/10/2014   Procedure: REPEAT CESAREAN SECTION;  Surgeon: Daria Pastures, MD;  Location: [facility];  Service: Obstetrics;  Laterality: N/A;   CHOLECYSTECTOMY  07/10/2008  CYSTOSCOPY N/A 09/28/2017   Procedure: CYSTOSCOPY;  Surgeon: Bobbye Charleston, MD;  Location: [facility];  Service: Gynecology;  Laterality: N/A;   CYSTOSCOPY Right 01/18/2018   Procedure: CYSTOSCOPY, RETROGRADE / PYELOGRAM, URETEROSCOPY;  Surgeon: Ceasar Mons, MD;  Location: [facility];  Service: Urology;  Laterality: Right;   CYSTOSCOPY W/ URETERAL STENT PLACEMENT Right 10/03/2017   Procedure: CYSTOSCOPY WITH RETROGRADE PYELOGRAM/URETERAL STENT PLACEMENT;  Surgeon: Ceasar Mons, MD;  Location: [facility];  Service: Urology;  Laterality: Right;   CYSTOSCOPY W/ URETERAL STENT PLACEMENT Right 02/22/2018    Procedure: CYSTOSCOPY WITH URETERAL STENT PLACEMENT;  Surgeon: Ceasar Mons, MD;  Location: [facility];  Service: Urology;  Laterality: Right;   KNEE ARTHROSCOPY WITH MEDIAL MENISECTOMY Right 06/26/2019   Procedure: KNEE ARTHROSCOPY WITH PARTIAL MEDIAL MENISECTOMY, REMOVAL LOOSE BODY;  Surgeon: Elsie Saas, MD;  Location: [facility];  Service: Orthopedics;  Laterality: Right;   LIPOMA EXCISION  2014   back   LIPOMA EXCISION N/A 05/21/2015   Procedure: EXCISION LIPOMAS ON ABDOMEN;  Surgeon: Erroll Luna, MD;  Location: [facility];  Service: General;  Laterality: N/A;   LUMBAR LAMINECTOMY/DECOMPRESSION MICRODISCECTOMY Right 01/23/2020   Procedure: Microdiscectomy right - Lumbar five-Sacral one;  Surgeon: Kary Kos, MD;  Location: [facility];  Service: Neurosurgery;  Laterality: Right;   LUMBAR PUNCTURE  09/19/17   [facility], Dr. Curt Bears   OOPHORECTOMY Left 09/28/2017   Procedure: OOPHORECTOMY;  Surgeon: Bobbye Charleston, MD;  Location: [facility];  Service: Gynecology;  Laterality: Left;   ROBOTIC ASSISTED LAPAROSCOPIC LYSIS OF ADHESION N/A 09/28/2017   Procedure: ROBOTIC ASSISTED LAPAROSCOPIC LYSIS OF ADHESION;  Surgeon: Bobbye Charleston, MD;  Location: [facility];  Service: Gynecology;  Laterality: N/A;   ROBOTIC ASSISTED TOTAL HYSTERECTOMY WITH BILATERAL SALPINGO OOPHERECTOMY Bilateral 09/28/2017   Procedure: ROBOTIC ASSISTED TOTAL HYSTERECTOMY WITH BILATERAL SALPINGECTOMY;  Surgeon: Bobbye Charleston, MD;  Location: [facility];  Service: Gynecology;  Laterality: Bilateral;   ROBOTICALLY ASSISTED LAPAROSCOPIC URETERAL RE-IMPLANTATION Right 02/22/2018   Procedure: ROBOTICALLY ASSISTED LAPAROSCOPIC URETERAL RE-IMPLANTATION;  Surgeon: Ceasar Mons, MD;  Location: [facility];  Service: Urology;  Laterality: Right;  ONLY NEEDS 240 MIN TOTAL FOR ALL PROCEDURES   TUBAL LIGATION     WISDOM TOOTH EXTRACTION       OB History     Gravida  8   Para   3   Term  3   Preterm      AB  5   Living  3      SAB  5   IAB      Ectopic      Multiple      Live Births  3           Family History  Problem Relation Age of Onset   Drug abuse Mother    Heart disease Maternal Grandmother    Heart disease Maternal Grandfather     Social History   Tobacco Use   Smoking status: Some Days    Packs/day: 0.50    Years: 16.00    Pack years: 8.00    Types: Cigarettes    Start date: 05/01/2014   Smokeless tobacco: Never  Vaping Use   Vaping Use: Never used  Substance Use Topics   Alcohol use: No    Alcohol/week: 0.0 standard drinks   Drug use: No    Home Medications Prior to Admission medications   Medication Sig Start Date End Date Taking? Authorizing Provider  HYDROcodone-acetaminophen (NORCO/VICODIN) 5-325 MG tablet Take 1 tablet by mouth every 4 (four) hours as needed. 07/05/21  Yes Wynona Dove A, DO  lidocaine (LIDODERM) 5 % Place 1 patch onto the skin daily. Remove & Discard patch within 12 hours or as directed by MD 07/05/21  Yes Jeanell Sparrow, DO  methocarbamol (ROBAXIN) 500 MG tablet Take 2 tablets (1,000 mg total) by mouth 2 (two) times daily as needed for up to 7 days for muscle spasms. 07/05/21 07/12/21 Yes Jeanell Sparrow, DO  acetaminophen (TYLENOL) 650 MG CR tablet Take 1,300 mg by mouth 5 (five) times daily.    [provider]  amLODipine (NORVASC) 5 MG tablet Take 1 tablet (5 mg total) by mouth at bedtime. 01/28/21   Maness, Arnette Norris, MD  empagliflozin (JARDIANCE) 10 MG TABS tablet Take 1 tablet (10 mg total) by mouth daily. 04/15/20   Nuala Alpha, DO  FLUoxetine (PROZAC) 20 MG capsule Take 1 capsule (20 mg total) by mouth daily for 14 days. 06/05/21 06/19/21  Maness, Arnette Norris, MD  FLUoxetine (PROZAC) 40 MG capsule Take 1 capsule (40 mg total) by mouth daily. 06/20/21   Maness, Arnette Norris, MD  losartan (COZAAR) 100 MG tablet Take 1 tablet (100 mg total) by mouth at bedtime. 12/12/20   Maness, Arnette Norris, MD  psyllium  (METAMUCIL SMOOTH TEXTURE) 58.6 % powder Take 1 packet by mouth 3 (three) times daily. 01/28/21   Maness, Arnette Norris, MD  SUMAtriptan (IMITREX) 50 MG tablet Take 1 tablet (50 mg total) by mouth every 2 (two) hours as needed for migraine. May repeat in 2 hours if headache persists or recurs. 12/12/20   Maness, Arnette Norris, MD    Allergies    Bee venom, Penicillins, Mushroom extract complex, Ciprofloxacin, and Powder  Review of Systems   Review of Systems  Constitutional:  Negative for activity change and fever.  HENT:  Negative for facial swelling and trouble swallowing.   Eyes:  Negative for discharge and redness.  Respiratory:  Negative for cough and shortness of breath.   Cardiovascular:  Negative for chest pain and palpitations.  Gastrointestinal:  Negative for abdominal pain and nausea.  Genitourinary:  Negative for dysuria and flank pain.  Musculoskeletal:  Positive for back pain. Negative for gait problem.  Skin:  Negative for pallor and rash.  Neurological:  Positive for numbness. Negative for syncope and headaches.   Physical Exam Updated Vital Signs BP (!) 173/98   Pulse 70   Temp 98.1 F (36.7 C) (Oral)   Resp 16   LMP 09/11/2017 (Approximate)   SpO2 99%   Physical Exam Vitals and nursing note reviewed.  Constitutional:      General: She is not in acute distress.    Appearance: Normal appearance.  HENT:     Head: Normocephalic and atraumatic.     Right Ear: External ear normal.     Left Ear: External ear normal.     Nose: Nose normal.     Mouth/Throat:     Mouth: Mucous membranes are moist.  Eyes:     General: No scleral icterus.       Right eye: No discharge.        Left eye: No discharge.  Cardiovascular:     Rate and Rhythm: Normal rate and regular rhythm.     Pulses: Normal pulses.     Heart sounds: Normal heart sounds.  Pulmonary:     Effort: Pulmonary effort is normal. No respiratory distress.     Breath sounds: Normal  breath sounds.  Abdominal:     General:  Abdomen is flat.     Tenderness: There is no abdominal tenderness.  Musculoskeletal:        General: Normal range of motion.       Arms:     Cervical back: Normal range of motion.     Right lower leg: No edema.     Left lower leg: No edema.  Skin:    General: Skin is warm and dry.     Capillary Refill: Capillary refill takes less than 2 seconds.  Neurological:     Mental Status: She is alert and oriented to person, place, and time.     GCS: GCS eye subscore is 4. GCS verbal subscore is 5. GCS motor subscore is 6.     Cranial Nerves: Cranial nerves are intact.     Sensory: Sensory deficit (reduced sensation to posterior calf on the right and lateral shoulder on left; chronic) present.     Motor: Motor function is intact.     Coordination: Coordination is intact.     Gait: Gait is intact.  Psychiatric:        Mood and Affect: Mood normal.        Behavior: Behavior normal.    ED Results / Procedures / Treatments   Labs (all labs ordered are listed, but only abnormal results are displayed) Labs Reviewed  CBC WITH DIFFERENTIAL/PLATELET - Abnormal; Notable for the following components:      Result Value   WBC 14.3 (*)    Neutro Abs 9.3 (*)    All other components within normal limits  BASIC METABOLIC PANEL - Abnormal; Notable for the following components:   Glucose, Bld 131 (*)    Creatinine, Ser <0.30 (*)    All other components within normal limits  I-STAT BETA HCG BLOOD, ED ([facility], [facility], [facility] ONLY)    EKG None  Radiology CT Lumbar Spine Wo Contrast  Result Date: 07/05/2021 CLINICAL DATA:  Low back pain for over 6 weeks EXAM: CT LUMBAR SPINE WITHOUT CONTRAST TECHNIQUE: Multidetector CT imaging of the lumbar spine was performed without intravenous contrast administration. Multiplanar CT image reconstructions were also generated. COMPARISON:  Lumbar MRI 05/31/2017 FINDINGS: Segmentation: 5 lumbar type vertebrae Alignment: Normal Vertebrae: No acute fracture or focal pathologic  process. Paraspinal and other soft tissues: No evidence of perispinal mass or inflammation. Disc levels: T12- L1: Unremarkable. L1-L2: Mild right foraminal entry stenosis from facet spurring. L2-L3: Unremarkable. L3-L4: Unremarkable. L4-L5: Unremarkable. L5-S1:Disc narrowing and bulging with right paracentral protrusion narrowing the right subarticular recess. Protrusion in this area was present and likely smaller on comparison MRI. IMPRESSION: 1. No acute finding. 2. L5-S1 right paracentral protrusion narrowing the right subarticular recess, please correlate for right S1 radiculopathy. Electronically Signed   By: Monte Fantasia M.D.   On: 07/05/2021 08:50    Procedures Procedures   Medications Ordered in ED Medications  lidocaine (LIDODERM) 5 % 1 patch (1 patch Transdermal Patch Applied 07/05/21 0745)  HYDROcodone-acetaminophen (NORCO/VICODIN) 5-325 MG per tablet 1 tablet (1 tablet Oral Given 07/05/21 0745)  methocarbamol (ROBAXIN) tablet 1,000 mg (1,000 mg Oral Given 07/05/21 0745)  ketorolac (TORADOL) 15 MG/ML injection 15 mg (15 mg Intravenous Given 07/05/21 0910)    ED Course  I have reviewed the triage vital signs and the nursing notes.  Pertinent labs & imaging results that were available during my care of the patient were reviewed by me and considered in my medical decision making (see  chart for details).    MDM Rules/Calculators/A&P                         This patient complains of low back pain; this involves an extensive number of treatment Options and is a complaint that carries with it a high risk of complications and Morbidity.  Neurologic exam is stable.  No urinary retention, overflow or bowel incontinence. No saddle paresthesias. Doubt acute spinal cord compression, cauda equina at this time; serious etiologies considered.    Labs reviewed, mild leukocytosis. No fever, no tachycardia. Doubt sepsis. CT lumbar reviewed, no acute changes.  Patient feels her symptoms are improved  following analgesics.  She is ambulatory.  Will provide short course of analgesics, multimodal pain control.   Expressed importance of following up with PCP and neurology regarding long term pain control and ongoing care of her chronic back pain.  Discussed strict return precautions, advised to RTED for any worsening or worrisome symptoms.    The patient improved significantly and was discharged in stable condition. Detailed discussions were had with the patient regarding current findings, and need for close f/u with PCP or on call doctor. The patient has been instructed to return immediately if the symptoms worsen in any way for re-evaluation. Patient verbalized understanding and is in agreement with current care plan. All questions answered prior to discharge.   Final Clinical Impression(s) / ED Diagnoses Final diagnoses:  Low back pain, unspecified back pain laterality, unspecified chronicity, unspecified whether sciatica present  Muscle strain    Rx / DC Orders ED Discharge Orders          Ordered    HYDROcodone-acetaminophen (NORCO/VICODIN) 5-325 MG tablet  Every 4 hours PRN        07/05/21 0922    methocarbamol (ROBAXIN) 500 MG tablet  2 times daily PRN        07/05/21 0922    lidocaine (LIDODERM) 5 %  Every 24 hours        07/05/21 0922             Jeanell Sparrow, DO 07/05/21 [phone]

## 2021-07-06 ENCOUNTER — Telehealth: Payer: Self-pay | Admitting: Student

## 2021-07-06 NOTE — Telephone Encounter (Signed)
I called and spoke with the patient regarding her ER visit for back pain yesterday. She is not having any improvement in pain, but denies red flag symptoms. I asked her to schedule a follow-up appointment with me next week/ the following week when I return from vacation, which she said she would do so we can hopefully better manage her ongoing chronic low back pain. Patient was appreciative of the follow-up communication.

## 2021-07-07 DIAGNOSIS — M25562 Pain in left knee: Secondary | ICD-10-CM | POA: Diagnosis not present

## 2021-07-07 DIAGNOSIS — M25561 Pain in right knee: Secondary | ICD-10-CM | POA: Diagnosis not present

## 2021-07-10 ENCOUNTER — Other Ambulatory Visit: Payer: Self-pay | Admitting: Physician Assistant

## 2021-07-10 ENCOUNTER — Other Ambulatory Visit: Payer: Self-pay | Admitting: Student

## 2021-07-10 ENCOUNTER — Telehealth: Payer: Self-pay

## 2021-07-10 DIAGNOSIS — M2241 Chondromalacia patellae, right knee: Secondary | ICD-10-CM

## 2021-07-10 DIAGNOSIS — R269 Unspecified abnormalities of gait and mobility: Secondary | ICD-10-CM

## 2021-07-10 DIAGNOSIS — M2242 Chondromalacia patellae, left knee: Secondary | ICD-10-CM

## 2021-07-10 DIAGNOSIS — M545 Low back pain, unspecified: Secondary | ICD-10-CM

## 2021-07-10 MED ORDER — FLUOXETINE HCL 40 MG PO CAPS: 40.0000 mg | ORAL_CAPSULE | Freq: Every day | ORAL | 3 refills | Status: DC

## 2021-07-10 NOTE — Telephone Encounter (Signed)
Fax received from [facility]. Fluoxetine 10 MG capsules need to be filled by provider on file with Medicaid or an alternative Rx is needed. Ottis Stain, CMA

## 2021-07-10 NOTE — Progress Notes (Signed)
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

## 2021-07-14 DIAGNOSIS — M25561 Pain in right knee: Secondary | ICD-10-CM | POA: Diagnosis not present

## 2021-07-14 DIAGNOSIS — F4312 Post-traumatic stress disorder, chronic: Secondary | ICD-10-CM | POA: Diagnosis not present

## 2021-07-21 DIAGNOSIS — F4312 Post-traumatic stress disorder, chronic: Secondary | ICD-10-CM | POA: Diagnosis not present

## 2021-07-28 DIAGNOSIS — F4312 Post-traumatic stress disorder, chronic: Secondary | ICD-10-CM | POA: Diagnosis not present

## 2021-07-30 DIAGNOSIS — S83241D Other tear of medial meniscus, current injury, right knee, subsequent encounter: Secondary | ICD-10-CM | POA: Diagnosis not present

## 2021-08-04 DIAGNOSIS — F4312 Post-traumatic stress disorder, chronic: Secondary | ICD-10-CM | POA: Diagnosis not present

## 2021-08-07 DIAGNOSIS — S83241D Other tear of medial meniscus, current injury, right knee, subsequent encounter: Secondary | ICD-10-CM | POA: Diagnosis not present

## 2021-08-10 ENCOUNTER — Other Ambulatory Visit: Payer: Self-pay | Admitting: Physician Assistant

## 2021-08-11 DIAGNOSIS — F4312 Post-traumatic stress disorder, chronic: Secondary | ICD-10-CM | POA: Diagnosis not present

## 2021-08-18 DIAGNOSIS — F4312 Post-traumatic stress disorder, chronic: Secondary | ICD-10-CM | POA: Diagnosis not present

## 2021-08-25 DIAGNOSIS — F4312 Post-traumatic stress disorder, chronic: Secondary | ICD-10-CM | POA: Diagnosis not present

## 2021-08-26 ENCOUNTER — Telehealth: Payer: Self-pay | Admitting: Neurology

## 2021-08-26 NOTE — Telephone Encounter (Signed)
Pt's Attorney called in wanting to find out if we had received the records we were needing from [facility]? If not she would like for them to be requested. The patient has had a hard time trying to get them.

## 2021-08-27 ENCOUNTER — Emergency Department (HOSPITAL_COMMUNITY): Payer: Medicaid Other

## 2021-08-27 ENCOUNTER — Other Ambulatory Visit: Payer: Self-pay

## 2021-08-27 ENCOUNTER — Encounter (HOSPITAL_COMMUNITY): Payer: Self-pay

## 2021-08-27 ENCOUNTER — Emergency Department (HOSPITAL_COMMUNITY)
Admission: EM | Admit: 2021-08-27 | Discharge: 2021-08-27 | Disposition: A | Discharge status: Home / Self Care | Payer: Medicaid Other | Attending: Emergency Medicine | Admitting: Emergency Medicine

## 2021-08-27 DIAGNOSIS — I10 Essential (primary) hypertension: Secondary | ICD-10-CM | POA: Diagnosis not present

## 2021-08-27 DIAGNOSIS — F17210 Nicotine dependence, cigarettes, uncomplicated: Secondary | ICD-10-CM | POA: Diagnosis not present

## 2021-08-27 DIAGNOSIS — Z7984 Long term (current) use of oral hypoglycemic drugs: Secondary | ICD-10-CM | POA: Insufficient documentation

## 2021-08-27 DIAGNOSIS — G8929 Other chronic pain: Secondary | ICD-10-CM | POA: Diagnosis not present

## 2021-08-27 DIAGNOSIS — Z79899 Other long term (current) drug therapy: Secondary | ICD-10-CM | POA: Insufficient documentation

## 2021-08-27 DIAGNOSIS — M5431 Sciatica, right side: Secondary | ICD-10-CM | POA: Insufficient documentation

## 2021-08-27 DIAGNOSIS — M5441 Lumbago with sciatica, right side: Secondary | ICD-10-CM | POA: Diagnosis not present

## 2021-08-27 DIAGNOSIS — M545 Low back pain, unspecified: Secondary | ICD-10-CM | POA: Diagnosis not present

## 2021-08-27 DIAGNOSIS — M549 Dorsalgia, unspecified: Secondary | ICD-10-CM | POA: Diagnosis present

## 2021-08-27 DIAGNOSIS — E1140 Type 2 diabetes mellitus with diabetic neuropathy, unspecified: Secondary | ICD-10-CM | POA: Insufficient documentation

## 2021-08-27 DIAGNOSIS — M25561 Pain in right knee: Secondary | ICD-10-CM | POA: Diagnosis not present

## 2021-08-27 MED ORDER — HYDROCODONE-ACETAMINOPHEN 5-325 MG PO TABS
1.0000 | ORAL_TABLET | ORAL | 0 refills | Status: DC | PRN
Start: 2021-08-27 — End: 2021-09-10

## 2021-08-27 MED ORDER — DEXAMETHASONE SODIUM PHOSPHATE 10 MG/ML IJ SOLN
10.0000 mg | Freq: Once | INTRAMUSCULAR | Status: AC
  Administered 2021-08-27: 10 mg via INTRAMUSCULAR
  Filled 2021-08-27: qty 1

## 2021-08-27 MED ORDER — DIAZEPAM 5 MG PO TABS
5.0000 mg | ORAL_TABLET | Freq: Two times a day (BID) | ORAL | 0 refills | Status: DC
Start: 2021-08-27 — End: 2021-09-10

## 2021-08-27 MED ORDER — MORPHINE SULFATE (PF) 4 MG/ML IV SOLN
4.0000 mg | Freq: Once | INTRAVENOUS | Status: AC
  Administered 2021-08-27: 4 mg via INTRAMUSCULAR
  Filled 2021-08-27: qty 1

## 2021-08-27 MED ORDER — PREDNISONE 10 MG (21) PO TBPK: ORAL_TABLET | Freq: Every day | ORAL | 0 refills | Status: DC

## 2021-08-27 MED ORDER — DIAZEPAM 5 MG PO TABS
5.0000 mg | ORAL_TABLET | Freq: Once | ORAL | Status: AC
  Administered 2021-08-27: 5 mg via ORAL
  Filled 2021-08-27: qty 1

## 2021-08-27 NOTE — ED Provider Notes (Signed)
Emergency Medicine Provider Triage Evaluation Note  [name] , a 40 y.o. female  was evaluated in triage.  Pt complains of worsening low back pain that radiates down right lower extremity.  Patient admits to chronic low back pain however, notes it has worsened over the past few days.  Patient states pain is atypical for her chronic pain due to severity.  Patient denies saddle paresthesias and bowel/bladder incontinence.  She endorses chronic lower extremity weakness after her back surgery last year.  No change from baseline.  Patient typically ambulates with a cane or a walker.  No fever or chills.  No IV drug use.  No recent injury.  Patient also endorses worsening right knee pain. She has surgery scheduled on 11/17 with Dr. Griffin Basil.   Review of Systems  Positive: Back pain Negative: fever  Physical Exam  BP (!) 193/121 (BP Location: Left Arm)   Pulse 88   Temp 97.9 F (36.6 C) (Oral)   Resp 18   Ht 5\' 6"  (1.676 m)   Wt 91.6 kg   LMP 09/11/2017 (Approximate)   SpO2 100%   BMI 32.60 kg/m  Gen:   Awake, no distress   Resp:  Normal effort  MSK:   Moves extremities without difficulty  Other:  Midline lumbar tenderness. Patient ambulating in the room with cane  Medical Decision Making  Medically screening exam initiated at 9:52 AM.  Appropriate orders placed.  Fenna Lorin Zobrist was informed that the remainder of the evaluation will be completed by another provider, this initial triage assessment does not replace that evaluation, and the importance of remaining in the ED until their evaluation is complete.  CT lumbar spine due to worsening of symptoms Right x-ray ordered   Karie Kirks 08/27/21 2440    Regan Lemming, MD 08/27/21 1155

## 2021-08-27 NOTE — ED Provider Notes (Signed)
[facility] DEPT Provider Note   CSN: [id number] Arrival date & time: 08/27/21  0911     History Chief Complaint  Patient presents with   Back Pain    [name] is a 40 y.o. female.  Pt presents to the ED today with back pain radiating down her right leg.  Pt said she it started this past weekend.  She has a hx of chronic back pain, but this is a little different.  Pt said has numbness in both feet normally.  She denies any bowel or bladder incontinence.  She is able to walk with a cane like usual.      Past Medical History:  Diagnosis Date   Abdominal lipoma 05/2015   Abdominal pain, RLQ 10/03/2017   Acute medial meniscus tear of right knee 06/09/2019   Anxiety    Arthritis    lower back   DM type 2 with diabetic peripheral neuropathy (HCC)    Borderline   GERD (gastroesophageal reflux disease)    Headache    Migraines   History of MRSA infection ~ 2006   leg   History of shingles    Hx of hydronephrosis    following total hysterectomy surgery ; Right kidney ; see CT abdomen 10-02-17 epic; states " a plug had got damaged during the surgery so thats why they put the stent in"    Hypertension    under control with med., has been on med. x 6 mos.   Lumbosacral spondylolysis    see MRI 06-01-17 epic    Mood swing    Optic nerve swelling    seen by Dr Mora Bellman 09-07-17, did lumbar puncture 09-19-17; patient states " they never called me about results ut i have an appt with him in april"; patient also states  " my vision is still blurry and worse when i get the migraines, they think the migraines are causing it"   Psoriasis     Patient Active Problem List   Diagnosis Date Noted   IBS (irritable bowel syndrome) 01/30/2021   Onychomycosis with ingrown toenail 11/07/2020   Depressed mood 04/17/2020   HNP (herniated nucleus pulposus), lumbar 01/23/2020   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   Hyperlipidemia 01/20/2018   Tobacco use  disorder 01/19/2018   Anxiety state 01/17/2018   Anemia 06/20/2017   Diabetes type 2, controlled ([place]) 05/16/2017   H/O: C-section 05/16/2017   Vitamin B12 deficiency 05/16/2017   Peripheral neuropathy 03/23/2017   Psoriasis 10/01/2013   Hypertension 09/26/2012   Obesity 09/26/2012   Subcutaneous nodule 09/26/2012   History of abnormal Pap smear 09/26/2012    Past Surgical History:  Procedure Laterality Date   CESAREAN SECTION  06/21/2008; 08/18/2010   CESAREAN SECTION N/A 07/10/2014   Procedure: REPEAT CESAREAN SECTION;  Surgeon: Daria Pastures, MD;  Location: [facility];  Service: Obstetrics;  Laterality: N/A;   CHOLECYSTECTOMY  07/10/2008   CYSTOSCOPY N/A 09/28/2017   Procedure: CYSTOSCOPY;  Surgeon: Bobbye Charleston, MD;  Location: [facility];  Service: Gynecology;  Laterality: N/A;   CYSTOSCOPY Right 01/18/2018   Procedure: CYSTOSCOPY, RETROGRADE / PYELOGRAM, URETEROSCOPY;  Surgeon: Ceasar Mons, MD;  Location: [facility];  Service: Urology;  Laterality: Right;   CYSTOSCOPY W/ URETERAL STENT PLACEMENT Right 10/03/2017   Procedure: CYSTOSCOPY WITH RETROGRADE PYELOGRAM/URETERAL STENT PLACEMENT;  Surgeon: Ceasar Mons, MD;  Location: [facility];  Service: Urology;  Laterality: Right;   CYSTOSCOPY W/ URETERAL STENT PLACEMENT Right 02/22/2018  Procedure: CYSTOSCOPY WITH URETERAL STENT PLACEMENT;  Surgeon: Ceasar Mons, MD;  Location[facility];  Service: Urology;  Laterality: Right;   KNEE ARTHROSCOPY WITH MEDIAL MENISECTOMY Right 06/26/2019   Procedure: KNEE ARTHROSCOPY WITH PARTIAL MEDIAL MENISECTOMY, REMOVAL LOOSE BODY;  Surgeon: Elsie Saas, MD;  Location: [facility];  Service: Orthopedics;  Laterality: Right;   LIPOMA EXCISION  2014   back   LIPOMA EXCISION N/A 05/21/2015   Procedure: EXCISION LIPOMAS ON ABDOMEN;  Surgeon: Erroll Luna, MD;  Location: [facility];  Service: General;  Laterality: N/A;   LUMBAR  LAMINECTOMY/DECOMPRESSION MICRODISCECTOMY Right 01/23/2020   Procedure: Microdiscectomy right - Lumbar five-Sacral one;  Surgeon: Kary Kos, MD;  Location: [facility];  Service: Neurosurgery;  Laterality: Right;   LUMBAR PUNCTURE  09/19/17   [facility], Dr. Curt Bears   OOPHORECTOMY Left 09/28/2017   Procedure: OOPHORECTOMY;  Surgeon: Bobbye Charleston, MD;  Location: [facility];  Service: Gynecology;  Laterality: Left;   ROBOTIC ASSISTED LAPAROSCOPIC LYSIS OF ADHESION N/A 09/28/2017   Procedure: ROBOTIC ASSISTED LAPAROSCOPIC LYSIS OF ADHESION;  Surgeon: Bobbye Charleston, MD;  Location: [facility];  Service: Gynecology;  Laterality: N/A;   ROBOTIC ASSISTED TOTAL HYSTERECTOMY WITH BILATERAL SALPINGO OOPHERECTOMY Bilateral 09/28/2017   Procedure: ROBOTIC ASSISTED TOTAL HYSTERECTOMY WITH BILATERAL SALPINGECTOMY;  Surgeon: Bobbye Charleston, MD;  Location: [facility];  Service: Gynecology;  Laterality: Bilateral;   ROBOTICALLY ASSISTED LAPAROSCOPIC URETERAL RE-IMPLANTATION Right 02/22/2018   Procedure: ROBOTICALLY ASSISTED LAPAROSCOPIC URETERAL RE-IMPLANTATION;  Surgeon: Ceasar Mons, MD;  [facility];  Service: Urology;  Laterality: Right;  ONLY NEEDS 240 MIN TOTAL FOR ALL PROCEDURES   TUBAL LIGATION     WISDOM TOOTH EXTRACTION       OB History     Gravida  8   Para  3   Term  3   Preterm      AB  5   Living  3      SAB  5   IAB      Ectopic      Multiple      Live Births  3           Family History  Problem Relation Age of Onset   Drug abuse Mother    Heart disease Maternal Grandmother    Heart disease Maternal Grandfather     Social History   Tobacco Use   Smoking status: Some Days    Packs/day: 0.50    Years: 16.00    Pack years: 8.00    Types: Cigarettes    Start date: 05/01/2014   Smokeless tobacco: Never  Vaping Use   Vaping Use: Never used  Substance Use Topics   Alcohol use: No    Alcohol/week: 0.0 standard drinks   Drug use: No     Home Medications Prior to Admission medications   Medication Sig Start Date End Date Taking? Authorizing Provider  diazepam (VALIUM) 5 MG tablet Take 1 tablet (5 mg total) by mouth 2 (two) times daily. 08/27/21  Yes Isla Pence, MD  HYDROcodone-acetaminophen (NORCO/VICODIN) 5-325 MG tablet Take 1 tablet by mouth every 4 (four) hours as needed. 08/27/21  Yes Isla Pence, MD  predniSONE (STERAPRED UNI-PAK 21 TAB) 10 MG (21) TBPK tablet Take by mouth daily. Take 6 tabs by mouth daily  for 2 days, then 5 tabs for 2 days, then 4 tabs for 2 days, then 3 tabs for 2 days, 2 tabs for 2 days, then 1 tab by  mouth daily for 2 days 08/27/21  Yes Isla Pence, MD  acetaminophen (TYLENOL) 650 MG CR tablet Take 1,300 mg by mouth 5 (five) times daily.    [provider]  amLODipine (NORVASC) 5 MG tablet Take 1 tablet (5 mg total) by mouth at bedtime. 01/28/21   Maness, Arnette Norris, MD  empagliflozin (JARDIANCE) 10 MG TABS tablet Take 1 tablet (10 mg total) by mouth daily. 04/15/20   Nuala Alpha, MD  FLUoxetine (PROZAC) 40 MG capsule Take 1 capsule (40 mg total) by mouth daily. 07/10/21   Wells Guiles, DO  lidocaine (LIDODERM) 5 % Place 1 patch onto the skin daily. Remove & Discard patch within 12 hours or as directed by MD 07/05/21   Jeanell Sparrow, DO  losartan (COZAAR) 100 MG tablet Take 1 tablet (100 mg total) by mouth at bedtime. 12/12/20   Maness, Arnette Norris, MD  psyllium (METAMUCIL SMOOTH TEXTURE) 58.6 % powder Take 1 packet by mouth 3 (three) times daily. 01/28/21   Maness, Arnette Norris, MD  SUMAtriptan (IMITREX) 50 MG tablet Take 1 tablet (50 mg total) by mouth every 2 (two) hours as needed for migraine. May repeat in 2 hours if headache persists or recurs. 12/12/20   Maness, Arnette Norris, MD    Allergies    Bee venom, Penicillins, Mushroom extract complex, Ciprofloxacin, and Powder  Review of Systems   Review of Systems  Musculoskeletal:  Positive for back pain.  All other systems reviewed and are  negative.  Physical Exam Updated Vital Signs BP (!) 172/109 (BP Location: Right Arm)   Pulse 66   Temp 98 F (36.7 C) (Oral)   Resp 20   Ht 5\' 6"  (1.676 m)   Wt 91.6 kg   LMP 09/11/2017 (Approximate)   SpO2 100%   BMI 32.60 kg/m   Physical Exam Vitals and nursing note reviewed.  Constitutional:      Appearance: Normal appearance.  HENT:     Head: Normocephalic and atraumatic.     Right Ear: External ear normal.     Left Ear: External ear normal.     Nose: Nose normal.     Mouth/Throat:     Mouth: Mucous membranes are moist.     Pharynx: Oropharynx is clear.  Eyes:     Extraocular Movements: Extraocular movements intact.     Conjunctiva/sclera: Conjunctivae normal.     Pupils: Pupils are equal, round, and reactive to light.  Cardiovascular:     Rate and Rhythm: Normal rate and regular rhythm.     Pulses: Normal pulses.     Heart sounds: Normal heart sounds.  Pulmonary:     Effort: Pulmonary effort is normal.     Breath sounds: Normal breath sounds.  Abdominal:     General: Abdomen is flat. Bowel sounds are normal.     Palpations: Abdomen is soft.  Musculoskeletal:     Cervical back: Normal range of motion and neck supple.     Lumbar back: Positive right straight leg raise test.       Back:  Skin:    General: Skin is warm.     Capillary Refill: Capillary refill takes less than 2 seconds.  Neurological:     General: No focal deficit present.     Mental Status: She is alert and oriented to person, place, and time.  Psychiatric:        Mood and Affect: Mood normal.        Behavior: Behavior normal.    ED Results /  Procedures / Treatments   Labs (all labs ordered are listed, but only abnormal results are displayed) Labs Reviewed - No data to display  EKG None  Radiology CT Lumbar Spine Wo Contrast  Result Date: 08/27/2021 CLINICAL DATA:  Lower back pain radiating down the right leg. EXAM: CT LUMBAR SPINE WITHOUT CONTRAST TECHNIQUE: Multidetector CT  imaging of the lumbar spine was performed without intravenous contrast administration. Multiplanar CT image reconstructions were also generated. COMPARISON:  07/05/2021 FINDINGS: Segmentation: 5 lumbar type vertebrae. Alignment: Normal. Vertebrae: No acute fracture or focal pathologic process. Paraspinal and other soft tissues: Negative. Disc levels: T11-12: Small right paracentral protrusion and ridging. L4-L5: Minor disc bulging. L5-S1:Disc narrowing and bulging with chronic right paracentral protrusion. Borderline facet spurring. Patent foramina. IMPRESSION: 1. Stable compared to CT last month. 2. L5-S1 disc degeneration with right paracentral protrusion that could affect the right S1 nerve root. Electronically Signed   By: Jorje Guild M.D.   On: 08/27/2021 11:01   DG Knee Complete 4 Views Right  Result Date: 08/27/2021 CLINICAL DATA:  Worsening RIGHT knee pain EXAM: RIGHT KNEE - COMPLETE 4+ VIEW COMPARISON:  04/20/2019 FINDINGS: Osseous mineralization normal for technique. Mild diffuse joint space narrowing. No fracture, dislocation, or bone destruction. No joint effusion. IMPRESSION: Mild degenerative changes RIGHT knee. Electronically Signed   By: Lavonia Dana M.D.   On: 08/27/2021 10:31    Procedures Procedures   Medications Ordered in ED Medications  dexamethasone (DECADRON) injection 10 mg (has no administration in time range)  morphine 4 MG/ML injection 4 mg (has no administration in time range)  diazepam (VALIUM) tablet 5 mg (has no administration in time range)    ED Course  I have reviewed the triage vital signs and the nursing notes.  Pertinent labs & imaging results that were available during my care of the patient were reviewed by me and considered in my medical decision making (see chart for details).    MDM Rules/Calculators/A&P                           Sx consistent with sciatica.  CT lumbar shows a disc protrusion at L5/s1.  Pt is given decadron, morphine, and  valium in the ED and will be d/c with prednisone, valium, and lortab.  She is to return if worse.  F/u with ortho.  She sees Dr. Griffin Basil.  Final Clinical Impression(s) / ED Diagnoses Final diagnoses:  Sciatica of right side    Rx / DC Orders ED Discharge Orders          Ordered    predniSONE (STERAPRED UNI-PAK 21 TAB) 10 MG (21) TBPK tablet  Daily        08/27/21 1714    HYDROcodone-acetaminophen (NORCO/VICODIN) 5-325 MG tablet  Every 4 hours PRN        08/27/21 1715    diazepam (VALIUM) 5 MG tablet  2 times daily        08/27/21 1715             Isla Pence, MD 08/27/21 1715

## 2021-08-27 NOTE — Telephone Encounter (Signed)
Will have to wait till 08/28/2021. I do not see any records, will ask Dr. Posey Pronto when she returns.

## 2021-08-27 NOTE — ED Notes (Signed)
An After Visit Summary was printed and given to the patient. Discharge instructions given and no further questions at this time.  

## 2021-08-27 NOTE — ED Triage Notes (Signed)
Pt c/o lower back pain that radiates down her right leg. Pt is supposed to have knee surgery next month, hx of back surgery last year. Denies any urinary issues.

## 2021-08-28 NOTE — Telephone Encounter (Signed)
Voicemail.

## 2021-08-28 NOTE — Telephone Encounter (Signed)
Records have not been received.  We can send the request again.

## 2021-08-31 NOTE — Telephone Encounter (Signed)
Patient release has been resent (faxed) to [facility] requesting records to be re-sent.

## 2021-09-01 ENCOUNTER — Other Ambulatory Visit: Payer: Self-pay | Admitting: Orthopedic Surgery

## 2021-09-01 DIAGNOSIS — M5416 Radiculopathy, lumbar region: Secondary | ICD-10-CM | POA: Diagnosis not present

## 2021-09-01 DIAGNOSIS — M5412 Radiculopathy, cervical region: Secondary | ICD-10-CM

## 2021-09-03 NOTE — Telephone Encounter (Signed)
Called [employer] Records and was informed that they use [employer] for all Medical Records now. I asked if we could receive the documents via fax instead of using [employer] and was informed they do not do that anymore.

## 2021-09-05 DIAGNOSIS — F4312 Post-traumatic stress disorder, chronic: Secondary | ICD-10-CM | POA: Diagnosis not present

## 2021-09-10 ENCOUNTER — Other Ambulatory Visit: Payer: Self-pay

## 2021-09-10 ENCOUNTER — Encounter (HOSPITAL_BASED_OUTPATIENT_CLINIC_OR_DEPARTMENT_OTHER): Payer: Self-pay | Admitting: Orthopaedic Surgery

## 2021-09-14 ENCOUNTER — Encounter (HOSPITAL_BASED_OUTPATIENT_CLINIC_OR_DEPARTMENT_OTHER)
Admission: RE | Admit: 2021-09-14 | Discharge: 2021-09-14 | Disposition: A | Discharge status: Home / Self Care | Payer: Medicaid Other | Source: Ambulatory Visit | Attending: Orthopaedic Surgery | Admitting: Orthopaedic Surgery

## 2021-09-14 DIAGNOSIS — E119 Type 2 diabetes mellitus without complications: Secondary | ICD-10-CM | POA: Insufficient documentation

## 2021-09-14 DIAGNOSIS — Z01818 Encounter for other preprocedural examination: Secondary | ICD-10-CM | POA: Insufficient documentation

## 2021-09-14 LAB — BASIC METABOLIC PANEL
Anion gap: 11 (ref 5–15)
BUN: 9 mg/dL (ref 6–20)
CO2: 23 mmol/L (ref 22–32)
Calcium: 8.9 mg/dL (ref 8.9–10.3)
Chloride: 102 mmol/L (ref 98–111)
Creatinine, Ser: 0.47 mg/dL (ref 0.44–1.00)
GFR, Estimated: >60 mL/min (ref >60)
Glucose, Bld: 128 mg/dL — ABNORMAL HIGH (ref 70–99)
Potassium: 4.7 mmol/L (ref 3.5–5.1)
Sodium: 136 mmol/L (ref 135–145)

## 2021-09-14 NOTE — H&P (Signed)
PREOPERATIVE H&P  Chief Complaint: right knee tenosynovitiis  HPI: [name] is a 40 y.o. female who is scheduled for, Procedure(s): ARTHROSCOPY KNEE WITH SYNOVECTOMY.   Patient has a past medical history significant for DM type 2, GERD, MRSA infection, HTN.   The patient had a knee scope two years ago for partial meniscectomies, synovectomy and chondroplasty but she is having persistent pain afterwards.She has had lots of swelling and pain afterwards.  She has burning-type pain. She had undergone an MRI of her knee on 07-07-21 that showed no new meniscal tear. She did have a Baker's cyst which had some debris in the Baker's cyst. Dr. Thedore Mins attempted to aspirate this but it was too thick and he could get minimal fluid out. She is not having significant posterior knee pain. Most of her pain is anterior and medial.   Her symptoms are rated as moderate to severe, and have been worsening.  This is significantly impairing activities of daily living.    Please see clinic note for further details on this patient's care.    She has elected for surgical management.   Past Medical History:  Diagnosis Date   Abdominal lipoma 05/2015   Abdominal pain, RLQ 10/03/2017   Acute medial meniscus tear of right knee 06/09/2019   Anxiety    Arthritis    lower back   DM type 2 with diabetic peripheral neuropathy (HCC)    Borderline   GERD (gastroesophageal reflux disease)    Headache    Migraines   History of MRSA infection ~ 2006   leg   History of shingles    Hx of hydronephrosis    following total hysterectomy surgery ; Right kidney ; see CT abdomen 10-02-17 epic; states " a plug had got damaged during the surgery so thats why they put the stent in"    Hypertension    under control with med., has been on med. x 6 mos.   Lumbosacral spondylolysis    see MRI 06-01-17 epic    Mood swing    Optic nerve swelling    seen by Dr Mora Bellman 09-07-17, did lumbar puncture 09-19-17; patient  states " they never called me about results ut i have an appt with him in april"; patient also states  " my vision is still blurry and worse when i get the migraines, they think the migraines are causing it"   Psoriasis    Past Surgical History:  Procedure Laterality Date   CESAREAN SECTION  06/21/2008; 08/18/2010   CESAREAN SECTION N/A 07/10/2014   Procedure: REPEAT CESAREAN SECTION;  Surgeon: Daria Pastures, MD;  Location: [facility];  Service: Obstetrics;  Laterality: N/A;   CHOLECYSTECTOMY  07/10/2008   CYSTOSCOPY N/A 09/28/2017   Procedure: CYSTOSCOPY;  Surgeon: Bobbye Charleston, MD;  Location: [facility];  Service: Gynecology;  Laterality: N/A;   CYSTOSCOPY Right 01/18/2018   Procedure: CYSTOSCOPY, RETROGRADE / PYELOGRAM, URETEROSCOPY;  Surgeon: Ceasar Mons, MD;  Location: [facility];  Service: Urology;  Laterality: Right;   CYSTOSCOPY W/ URETERAL STENT PLACEMENT Right 10/03/2017   Procedure: CYSTOSCOPY WITH RETROGRADE PYELOGRAM/URETERAL STENT PLACEMENT;  Surgeon: Ceasar Mons, MD;  Location: [facility];  Service: Urology;  Laterality: Right;   CYSTOSCOPY W/ URETERAL STENT PLACEMENT Right 02/22/2018   Procedure: CYSTOSCOPY WITH URETERAL STENT PLACEMENT;  Surgeon: Ceasar Mons, MD;  [facility];  Service: Urology;  Laterality: Right;   KNEE ARTHROSCOPY WITH MEDIAL MENISECTOMY Right 06/26/2019   Procedure:  KNEE ARTHROSCOPY WITH PARTIAL MEDIAL MENISECTOMY, REMOVAL LOOSE BODY;  Surgeon: Elsie Saas, MD;  Location: [facility];  Service: Orthopedics;  Laterality: Right;   LIPOMA EXCISION  2014   back   LIPOMA EXCISION N/A 05/21/2015   Procedure: EXCISION LIPOMAS ON ABDOMEN;  Surgeon: Erroll Luna, MD;  Location: [facility];  Service: General;  Laterality: N/A;   LUMBAR LAMINECTOMY/DECOMPRESSION MICRODISCECTOMY Right 01/23/2020   Procedure: Microdiscectomy right - Lumbar five-Sacral one;  Surgeon: Kary Kos, MD;   Location: [facility];  Service: Neurosurgery;  Laterality: Right;   LUMBAR PUNCTURE  09/19/17   [facility], Dr. Curt Bears   OOPHORECTOMY Left 09/28/2017   Procedure: OOPHORECTOMY;  Surgeon: Bobbye Charleston, MD;  Location: [facility];  Service: Gynecology;  Laterality: Left;   ROBOTIC ASSISTED LAPAROSCOPIC LYSIS OF ADHESION N/A 09/28/2017   Procedure: ROBOTIC ASSISTED LAPAROSCOPIC LYSIS OF ADHESION;  Surgeon: Bobbye Charleston, MD;  Location: [facility];  Service: Gynecology;  Laterality: N/A;   ROBOTIC ASSISTED TOTAL HYSTERECTOMY WITH BILATERAL SALPINGO OOPHERECTOMY Bilateral 09/28/2017   Procedure: ROBOTIC ASSISTED TOTAL HYSTERECTOMY WITH BILATERAL SALPINGECTOMY;  Surgeon: Bobbye Charleston, MD;  Location: [facility];  Service: Gynecology;  Laterality: Bilateral;   ROBOTICALLY ASSISTED LAPAROSCOPIC URETERAL RE-IMPLANTATION Right 02/22/2018   Procedure: ROBOTICALLY ASSISTED LAPAROSCOPIC URETERAL RE-IMPLANTATION;  Surgeon: Ceasar Mons, MD;  [facility];  Service: Urology;  Laterality: Right;  ONLY NEEDS 240 MIN TOTAL FOR ALL PROCEDURES   TUBAL LIGATION     WISDOM TOOTH EXTRACTION     Social History   Socioeconomic History   Marital status: Married    Spouse name: Not on file   Number of children: Not on file   Years of education: Not on file   Highest education level: Not on file  Occupational History   Not on file  Tobacco Use   Smoking status: Some Days    Packs/day: 0.50    Years: 16.00    Pack years: 8.00    Types: Cigarettes    Start date: 05/01/2014   Smokeless tobacco: Never  Vaping Use   Vaping Use: Never used  Substance and Sexual Activity   Alcohol use: No    Alcohol/week: 0.0 standard drinks   Drug use: No   Sexual activity: Yes    Birth control/protection: None  Other Topics Concern   Not on file  Social History Narrative   Live home with husband and 3 kids.   Education HS grad.  Culinary grad.  Caffeine  8 glasses (sweet tea), some soda's occ.   Working: [employer].     Social Determinants of Health   Financial Resource Strain: Not on file  Food Insecurity: Not on file  Transportation Needs: Not on file  Physical Activity: Not on file  Stress: Not on file  Social Connections: Not on file   Family History  Problem Relation Age of Onset   Drug abuse Mother    Heart disease Maternal Grandmother    Heart disease Maternal Grandfather    Allergies  Allergen Reactions   Bee Venom Anaphylaxis   Penicillins Anaphylaxis and Other (See Comments)    Has patient had a PCN reaction causing immediate rash, facial/tongue/throat swelling, SOB or lightheadedness with hypotension: Yes Has patient had a PCN reaction causing severe rash involving mucus membranes or skin necrosis: No Has patient had a PCN reaction that required hospitalization: Yes Has patient had a PCN reaction occurring within the last 10 years: No If all of the above answers are "  NO", then may proceed with Cephalosporin use.    Mushroom Extract Complex Nausea And Vomiting   Ciprofloxacin Rash   Powder Itching, Rash and Other (See Comments)    The powder from latex gloves   Prior to Admission medications   Medication Sig Start Date End Date Taking? Authorizing Provider  amLODipine (NORVASC) 5 MG tablet Take 1 tablet (5 mg total) by mouth at bedtime. 01/28/21  Yes Maness, Arnette Norris, MD  celecoxib (CELEBREX) 100 MG capsule Take 100 mg by mouth 2 (two) times daily.   Yes [provider]  empagliflozin (JARDIANCE) 10 MG TABS tablet Take 1 tablet (10 mg total) by mouth daily. 04/15/20  Yes Nuala Alpha, MD  FLUoxetine (PROZAC) 40 MG capsule Take 1 capsule (40 mg total) by mouth daily. 07/10/21  Yes Wells Guiles, DO  losartan (COZAAR) 100 MG tablet Take 1 tablet (100 mg total) by mouth at bedtime. 12/12/20  Yes Maness, Arnette Norris, MD  SUMAtriptan (IMITREX) 50 MG tablet Take 1 tablet (50 mg total) by mouth every 2 (two) hours as needed for migraine. May repeat in 2 hours if  headache persists or recurs. 12/12/20  Yes Maness, Arnette Norris, MD  acetaminophen (TYLENOL) 650 MG CR tablet Take 1,300 mg by mouth 5 (five) times daily.    [provider]  lidocaine (LIDODERM) 5 % Place 1 patch onto the skin daily. Remove & Discard patch within 12 hours or as directed by MD 07/05/21   Wynona Dove A, DO    ROS: All other systems have been reviewed and were otherwise negative with the exception of those mentioned in the HPI and as above.  Physical Exam: General: Alert, no acute distress Cardiovascular: No pedal edema Respiratory: No cyanosis, no use of accessory musculature GI: No organomegaly, abdomen is soft and non-tender Skin: No lesions in the area of chief complaint Neurologic: Sensation intact distally Psychiatric: Patient is competent for consent with normal mood and affect Lymphatic: No axillary or cervical lymphadenopathy  MUSCULOSKELETAL:  The right knee demonstrates obvious swelling that is atypical in its presence.  It is somewhat atypical in the contours of her knee.  She has some fullness in the back of her knee, but no pain there.  She has tenderness to palpation grossly around the knee.  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  Assessment: right knee tenosynovitiis PVNS  Plan: Plan for Procedure(s): ARTHROSCOPY KNEE WITH SYNOVECTOMY  The risks benefits and alternatives were discussed with the patient including but not limited to the risks of nonoperative treatment, versus surgical intervention including infection, bleeding, nerve injury,  blood clots, cardiopulmonary complications, morbidity, mortality, among others, and they were willing to proceed.   The patient acknowledged the explanation, agreed to proceed with the plan and consent was signed.   Operative Plan: Right knee scope with extensive synovectomy  Discharge Medications: Standard DVT Prophylaxis:  Aspirin Physical Therapy: Outpatient PT Special Discharge needs: +/-   Ethelda Chick, PA-C  09/14/2021 5:02 PM

## 2021-09-14 NOTE — Progress Notes (Signed)

## 2021-09-16 NOTE — Discharge Instructions (Addendum)
Ophelia Charter MD, MPH Noemi Chapel, PA-C [place] [address] [phone] (tel)   [phone] (fax)   POST-OPERATIVE INSTRUCTIONS - Knee Arthroscopy  WOUND CARE - You may remove the Operative Dressing on Post-Op Day #3 (72hrs after surgery).   -  Alternatively if you would like you can leave dressing on until follow-up if within 7-8 days but keep it dry. - Leave steri-strips in place until they fall off on their own, usually 2 weeks postop. - An ACE wrap may be used to control swelling, do not wrap this too tight.  If the initial ACE wrap feels too tight you may loosen it. - There may be a small amount of fluid/bleeding leaking at the surgical site.  - This is normal; the knee is filled with fluid during the procedure and can leak for 24-48hrs after surgery. You may change/reinforce the bandage as needed.  - Use the Cryocuff or Ice as often as possible for the first 7 days, then as needed for pain relief. Always keep a towel, ACE wrap or other barrier between the cooling unit and your skin.  - You may shower on Post-Op Day #3. Gently pat the area dry. Do not soak the knee in water or submerge it.  - Do not go swimming in the pool or ocean until 4 weeks after surgery or when otherwise instructed.  Keep incisions as dry as possible.   BRACE/AMBULATION -  You will not need a brace after this procedure.   - You may use crutches or a walker initially to help you ambulate, but this is not required - You can put full weight on your operative leg as you tolerate  REGIONAL ANESTHESIA (NERVE BLOCKS) - The anesthesia team may have performed a nerve block for you if safe in the setting of your care.  This is a great tool used to minimize pain.  Typically the block may start wearing off overnight.  This can be a challenging period but please utilize your as needed pain medications to try and manage this period and know it will be a brief transition as the nerve  block wears completely   POST-OP MEDICATIONS - Multimodal approach to pain control - In general your pain will be controlled with a combination of substances.  Prescriptions unless otherwise discussed are electronically sent to your pharmacy.  This is a carefully made plan we use to minimize narcotic use.     - Meloxicam - Anti-inflammatory medication taken on a scheduled basis - Acetaminophen - Non-narcotic pain medicine taken on a scheduled basis  - Oxycodone - This is a strong narcotic, to be used only on an "as needed" basis for SEVERE pain. - Aspirin 81mg  - This medicine is used to minimize the risk of blood clots after surgery. -  Zofran - take as needed for nausea   FOLLOW-UP   Please call the office to schedule a follow-up appointment for your incision check, 7-10 days post-operatively.   IF YOU HAVE ANY QUESTIONS, PLEASE FEEL FREE TO CALL OUR OFFICE.   HELPFUL INFORMATION  - If you had a block, it will wear off between 8-24 hrs postop typically.  This is period when your pain may go from nearly zero to the pain you would have had post-op without the block.  This is an abrupt transition but nothing dangerous is happening.  You may take an extra dose of narcotic when this happens.   Keep your leg elevated to  decrease swelling, which will then in turn decrease your pain. I would elevate the foot of your bed by putting a couple of couch pillows between your mattress and box spring. I would not keep pillow directly under your ankle.  - Do not sleep with a pillow behind your knee even if it is more comfortable as this may make it harder to get your knee fully straight long term.   There will be MORE swelling on days 1-3 than there is on the day of surgery.  This also is normal. The swelling will decrease with the anti-inflammatory medication, ice and keeping it elevated. The swelling will make it more difficult to bend your knee. As the swelling goes down your motion will become  easier   You may develop swelling and bruising that extends from your knee down to your calf and perhaps even to your foot over the next week. Do not be alarmed. This too is normal, and it is due to gravity   There may be some numbness adjacent to the incision site. This may last for 6-12 months or longer in some patients and is expected.   You may return to sedentary work/school in the next couple of days when you feel up to it. You will need to keep your leg elevated as much as possible    You should wean off your narcotic medicines as soon as you are able.  Most patients will be off or using minimal narcotics before their first postop appointment.    We suggest you use the pain medication the first night prior to going to bed, in order to ease any pain when the anesthesia wears off. You should avoid taking pain medications on an empty stomach as it will make you nauseous.   Do not drink alcoholic beverages or take illicit drugs when taking pain medications.   It is against the law to drive while taking narcotics. You cannot drive if your Right leg is in brace locked in extension.   Pain medication may make you constipated.  Below are a few solutions to try in this order:  o Decrease the amount of pain medication if you aren't having pain.  o Drink lots of decaffeinated fluids.  o Drink prune juice and/or eat dried prunes   o If the first 3 don't work start with additional solutions  o Take Colace - an over-the-counter stool softener  o Take Senokot - an over-the-counter laxative  o Take Miralax - a stronger over-the-counter laxative    For more information including helpful videos and documents visit our website:   [email or web address]    Post Anesthesia Home Care Instructions  Activity: Get plenty of rest for the remainder of the day. A responsible individual must stay with you for 24 hours following the procedure.  For the next 24  hours, DO NOT: -Drive a car -Paediatric nurse -Drink alcoholic beverages -Take any medication unless instructed by your physician -Make any legal decisions or sign important papers.  Meals: Start with liquid foods such as gelatin or soup. Progress to regular foods as tolerated. Avoid greasy, spicy, heavy foods. If nausea and/or vomiting occur, drink only clear liquids until the nausea and/or vomiting subsides. Call your physician if vomiting continues.  Special Instructions/Symptoms: Your throat may feel dry or sore from the anesthesia or the breathing tube placed in your throat during surgery. If this causes discomfort, gargle with warm salt water. The discomfort should disappear within 24 hours.  If you had  a scopolamine patch placed behind your ear for the management of post- operative nausea and/or vomiting:  1. The medication in the patch is effective for 72 hours, after which it should be removed.  Wrap patch in a tissue and discard in the trash. Wash hands thoroughly with soap and water. 2. You may remove the patch earlier than 72 hours if you experience unpleasant side effects which may include dry mouth, dizziness or visual disturbances. 3. Avoid touching the patch. Wash your hands with soap and water after contact with the patch.     

## 2021-09-17 ENCOUNTER — Ambulatory Visit (HOSPITAL_BASED_OUTPATIENT_CLINIC_OR_DEPARTMENT_OTHER): Payer: Medicaid Other | Admitting: Certified Registered"

## 2021-09-17 ENCOUNTER — Encounter (HOSPITAL_BASED_OUTPATIENT_CLINIC_OR_DEPARTMENT_OTHER)
Admission: RE | Disposition: A | Discharge status: Home / Self Care | Payer: Self-pay | Source: Home / Self Care | Attending: Orthopaedic Surgery

## 2021-09-17 ENCOUNTER — Ambulatory Visit (HOSPITAL_BASED_OUTPATIENT_CLINIC_OR_DEPARTMENT_OTHER)
Admission: RE | Admit: 2021-09-17 | Discharge: 2021-09-17 | Disposition: A | Discharge status: Home / Self Care | Payer: Medicaid Other | Attending: Orthopaedic Surgery | Admitting: Orthopaedic Surgery

## 2021-09-17 ENCOUNTER — Other Ambulatory Visit: Payer: Self-pay

## 2021-09-17 ENCOUNTER — Encounter (HOSPITAL_BASED_OUTPATIENT_CLINIC_OR_DEPARTMENT_OTHER): Payer: Self-pay | Admitting: Orthopaedic Surgery

## 2021-09-17 DIAGNOSIS — I10 Essential (primary) hypertension: Secondary | ICD-10-CM | POA: Diagnosis not present

## 2021-09-17 DIAGNOSIS — E1142 Type 2 diabetes mellitus with diabetic polyneuropathy: Secondary | ICD-10-CM | POA: Insufficient documentation

## 2021-09-17 DIAGNOSIS — M12261 Villonodular synovitis (pigmented), right knee: Secondary | ICD-10-CM | POA: Diagnosis not present

## 2021-09-17 DIAGNOSIS — M23231 Derangement of other medial meniscus due to old tear or injury, right knee: Secondary | ICD-10-CM | POA: Diagnosis not present

## 2021-09-17 DIAGNOSIS — Z79899 Other long term (current) drug therapy: Secondary | ICD-10-CM | POA: Diagnosis not present

## 2021-09-17 DIAGNOSIS — M659 Synovitis and tenosynovitis, unspecified: Secondary | ICD-10-CM | POA: Diagnosis not present

## 2021-09-17 DIAGNOSIS — R519 Headache, unspecified: Secondary | ICD-10-CM | POA: Insufficient documentation

## 2021-09-17 DIAGNOSIS — E785 Hyperlipidemia, unspecified: Secondary | ICD-10-CM | POA: Diagnosis not present

## 2021-09-17 DIAGNOSIS — Z8614 Personal history of Methicillin resistant Staphylococcus aureus infection: Secondary | ICD-10-CM | POA: Diagnosis not present

## 2021-09-17 DIAGNOSIS — F411 Generalized anxiety disorder: Secondary | ICD-10-CM | POA: Diagnosis not present

## 2021-09-17 DIAGNOSIS — M6588 Other synovitis and tenosynovitis, other site: Secondary | ICD-10-CM | POA: Diagnosis not present

## 2021-09-17 DIAGNOSIS — F17210 Nicotine dependence, cigarettes, uncomplicated: Secondary | ICD-10-CM | POA: Insufficient documentation

## 2021-09-17 DIAGNOSIS — F419 Anxiety disorder, unspecified: Secondary | ICD-10-CM | POA: Insufficient documentation

## 2021-09-17 DIAGNOSIS — M1990 Unspecified osteoarthritis, unspecified site: Secondary | ICD-10-CM | POA: Insufficient documentation

## 2021-09-17 DIAGNOSIS — L929 Granulomatous disorder of the skin and subcutaneous tissue, unspecified: Secondary | ICD-10-CM | POA: Diagnosis not present

## 2021-09-17 DIAGNOSIS — M2341 Loose body in knee, right knee: Secondary | ICD-10-CM | POA: Insufficient documentation

## 2021-09-17 DIAGNOSIS — M67361 Transient synovitis, right knee: Secondary | ICD-10-CM | POA: Diagnosis not present

## 2021-09-17 DIAGNOSIS — E119 Type 2 diabetes mellitus without complications: Secondary | ICD-10-CM

## 2021-09-17 HISTORY — PX: KNEE ARTHROSCOPY: SHX127

## 2021-09-17 LAB — GLUCOSE, CAPILLARY
Glucose-Capillary: 136 mg/dL — ABNORMAL HIGH (ref 70–99)
Glucose-Capillary: 141 mg/dL — ABNORMAL HIGH (ref 70–99)

## 2021-09-17 SURGERY — ARTHROSCOPY, KNEE
Anesthesia: General | Site: Knee | Laterality: Right

## 2021-09-17 MED ORDER — BUPIVACAINE HCL (PF) 0.25 % IJ SOLN
INTRAMUSCULAR | Status: AC
  Filled 2021-09-17: qty 60

## 2021-09-17 MED ORDER — BUPIVACAINE HCL (PF) 0.25 % IJ SOLN
INTRAMUSCULAR | Status: DC | PRN
  Administered 2021-09-17: 20 mL

## 2021-09-17 MED ORDER — VANCOMYCIN HCL 1000 MG IV SOLR
INTRAVENOUS | Status: AC
  Filled 2021-09-17: qty 40

## 2021-09-17 MED ORDER — MELOXICAM 15 MG PO TABS: 15.0000 mg | ORAL_TABLET | Freq: Every day | ORAL | 0 refills | Status: DC

## 2021-09-17 MED ORDER — DEXAMETHASONE SODIUM PHOSPHATE 10 MG/ML IJ SOLN
INTRAMUSCULAR | Status: DC | PRN
  Administered 2021-09-17: 5 mg via INTRAVENOUS

## 2021-09-17 MED ORDER — LIDOCAINE 2% (20 MG/ML) 5 ML SYRINGE
INTRAMUSCULAR | Status: AC
  Filled 2021-09-17: qty 5

## 2021-09-17 MED ORDER — ONDANSETRON HCL 4 MG PO TABS: 4.0000 mg | ORAL_TABLET | Freq: Three times a day (TID) | ORAL | 0 refills | Status: AC | PRN

## 2021-09-17 MED ORDER — DEXAMETHASONE SODIUM PHOSPHATE 10 MG/ML IJ SOLN
INTRAMUSCULAR | Status: AC
  Filled 2021-09-17: qty 1

## 2021-09-17 MED ORDER — ACETAMINOPHEN 500 MG PO TABS: 1000.0000 mg | ORAL_TABLET | Freq: Three times a day (TID) | ORAL | 0 refills | Status: AC

## 2021-09-17 MED ORDER — ONDANSETRON HCL 4 MG/2ML IJ SOLN
INTRAMUSCULAR | Status: AC
  Filled 2021-09-17: qty 2

## 2021-09-17 MED ORDER — PROPOFOL 10 MG/ML IV BOLUS
INTRAVENOUS | Status: DC | PRN
  Administered 2021-09-17: 200 mg via INTRAVENOUS

## 2021-09-17 MED ORDER — OXYCODONE HCL 5 MG PO TABS: ORAL_TABLET | ORAL | 0 refills | Status: AC

## 2021-09-17 MED ORDER — PROPOFOL 10 MG/ML IV BOLUS
INTRAVENOUS | Status: AC
  Filled 2021-09-17: qty 40

## 2021-09-17 MED ORDER — CLINDAMYCIN PHOSPHATE 900 MG/50ML IV SOLN
900.0000 mg | INTRAVENOUS | Status: AC
  Administered 2021-09-17: 08:00:00 900 mg via INTRAVENOUS

## 2021-09-17 MED ORDER — OXYCODONE HCL 5 MG/5ML PO SOLN: 5.0000 mg | Freq: Once | ORAL | Status: DC | PRN

## 2021-09-17 MED ORDER — HYDROMORPHONE HCL 1 MG/ML IJ SOLN
INTRAMUSCULAR | Status: AC
  Filled 2021-09-17: qty 0.5

## 2021-09-17 MED ORDER — ASPIRIN 81 MG PO CHEW: 81.0000 mg | CHEWABLE_TABLET | Freq: Two times a day (BID) | ORAL | 0 refills | Status: AC

## 2021-09-17 MED ORDER — CLINDAMYCIN PHOSPHATE 900 MG/50ML IV SOLN
INTRAVENOUS | Status: AC
  Filled 2021-09-17: qty 50

## 2021-09-17 MED ORDER — MIDAZOLAM HCL 5 MG/5ML IJ SOLN
INTRAMUSCULAR | Status: DC | PRN
  Administered 2021-09-17: 2 mg via INTRAVENOUS

## 2021-09-17 MED ORDER — FENTANYL CITRATE (PF) 100 MCG/2ML IJ SOLN
INTRAMUSCULAR | Status: AC
  Filled 2021-09-17: qty 2

## 2021-09-17 MED ORDER — ONDANSETRON HCL 4 MG/2ML IJ SOLN
INTRAMUSCULAR | Status: DC | PRN
  Administered 2021-09-17: 4 mg via INTRAVENOUS

## 2021-09-17 MED ORDER — MEPERIDINE HCL 25 MG/ML IJ SOLN: 6.2500 mg | INTRAMUSCULAR | Status: DC | PRN

## 2021-09-17 MED ORDER — SODIUM CHLORIDE 0.9 % IR SOLN
Status: DC | PRN
  Administered 2021-09-17 (×2): 3000 mL

## 2021-09-17 MED ORDER — HYDROMORPHONE HCL 1 MG/ML IJ SOLN
0.2500 mg | INTRAMUSCULAR | Status: DC | PRN
Start: 2021-09-17 — End: 2021-09-17
  Administered 2021-09-17: 08:00:00 0.5 mg via INTRAVENOUS

## 2021-09-17 MED ORDER — MIDAZOLAM HCL 2 MG/2ML IJ SOLN
INTRAMUSCULAR | Status: AC
  Filled 2021-09-17: qty 2

## 2021-09-17 MED ORDER — PROMETHAZINE HCL 25 MG/ML IJ SOLN: 6.2500 mg | INTRAMUSCULAR | Status: DC | PRN

## 2021-09-17 MED ORDER — LIDOCAINE HCL (CARDIAC) PF 100 MG/5ML IV SOSY
PREFILLED_SYRINGE | INTRAVENOUS | Status: DC | PRN
  Administered 2021-09-17: 60 mg via INTRAVENOUS

## 2021-09-17 MED ORDER — AMISULPRIDE (ANTIEMETIC) 5 MG/2ML IV SOLN: 10.0000 mg | Freq: Once | INTRAVENOUS | Status: DC | PRN

## 2021-09-17 MED ORDER — FENTANYL CITRATE (PF) 100 MCG/2ML IJ SOLN
INTRAMUSCULAR | Status: DC | PRN
  Administered 2021-09-17: 50 ug via INTRAVENOUS
  Administered 2021-09-17 (×2): 25 ug via INTRAVENOUS

## 2021-09-17 MED ORDER — LACTATED RINGERS IV SOLN: INTRAVENOUS | Status: DC

## 2021-09-17 MED ORDER — OXYCODONE HCL 5 MG PO TABS: 5.0000 mg | ORAL_TABLET | Freq: Once | ORAL | Status: DC | PRN

## 2021-09-17 SURGICAL SUPPLY — 39 items
APL PRP STRL LF DISP 70% ISPRP (MISCELLANEOUS) ×1
BANDAGE ESMARK 6X9 LF (GAUZE/BANDAGES/DRESSINGS) IMPLANT
BLADE CLIPPER SURG (BLADE) IMPLANT
BNDG CMPR 9X6 STRL LF SNTH (GAUZE/BANDAGES/DRESSINGS)
BNDG ELASTIC 6X5.8 VLCR STR LF (GAUZE/BANDAGES/DRESSINGS) ×2 IMPLANT
BNDG ESMARK 6X9 LF (GAUZE/BANDAGES/DRESSINGS)
CHLORAPREP W/TINT 26 (MISCELLANEOUS) ×2 IMPLANT
CLSR STERI-STRIP ANTIMIC 1/2X4 (GAUZE/BANDAGES/DRESSINGS) ×2 IMPLANT
CUFF TOURN SGL QUICK 34 (TOURNIQUET CUFF)
CUFF TRNQT CYL 34X4.125X (TOURNIQUET CUFF) IMPLANT
DISSECTOR 3.5MM X 13CM CVD (MISCELLANEOUS) ×2 IMPLANT
DISSECTOR 4.0MMX13CM CVD (MISCELLANEOUS) IMPLANT
DRAPE ARTHROSCOPY W/POUCH 90 (DRAPES) ×2 IMPLANT
DRAPE IMP U-DRAPE 54X76 (DRAPES) ×2 IMPLANT
DRAPE U-SHAPE 47X51 STRL (DRAPES) ×2 IMPLANT
GAUZE SPONGE 4X4 12PLY STRL (GAUZE/BANDAGES/DRESSINGS) ×2 IMPLANT
GAUZE XEROFORM 1X8 LF (GAUZE/BANDAGES/DRESSINGS) IMPLANT
GLOVE SRG 8 PF TXTR STRL LF DI (GLOVE) ×1 IMPLANT
GLOVE SURG ENC MOIS LTX SZ6.5 (GLOVE) ×2 IMPLANT
GLOVE SURG LTX SZ8 (GLOVE) ×4 IMPLANT
GLOVE SURG UNDER POLY LF SZ6.5 (GLOVE) ×2 IMPLANT
GLOVE SURG UNDER POLY LF SZ8 (GLOVE) ×2
GOWN STRL REUS W/ TWL LRG LVL3 (GOWN DISPOSABLE) ×2 IMPLANT
GOWN STRL REUS W/TWL LRG LVL3 (GOWN DISPOSABLE) ×4
GOWN STRL REUS W/TWL XL LVL3 (GOWN DISPOSABLE) ×2 IMPLANT
KIT TURNOVER KIT B (KITS) ×2 IMPLANT
MANIFOLD NEPTUNE II (INSTRUMENTS) ×2 IMPLANT
NDL SAFETY ECLIPSE 18X1.5 (NEEDLE) ×1 IMPLANT
NEEDLE HYPO 18GX1.5 SHARP (NEEDLE) ×2
NS IRRIG 1000ML POUR BTL (IV SOLUTION) IMPLANT
PACK ARTHROSCOPY DSU (CUSTOM PROCEDURE TRAY) ×2 IMPLANT
PAD ARMBOARD 7.5X6 YLW CONV (MISCELLANEOUS) IMPLANT
PADDING CAST COTTON 6X4 STRL (CAST SUPPLIES) IMPLANT
SUT MNCRL AB 4-0 PS2 18 (SUTURE) ×2 IMPLANT
SYR 5ML LUER SLIP (SYRINGE) ×2 IMPLANT
TOWEL GREEN STERILE FF (TOWEL DISPOSABLE) ×2 IMPLANT
TUBING ARTHROSCOPY IRRIG 16FT (MISCELLANEOUS) ×2 IMPLANT
WATER STERILE IRR 1000ML POUR (IV SOLUTION) IMPLANT
WRAP KNEE MAXI GEL POST OP (GAUZE/BANDAGES/DRESSINGS) ×2 IMPLANT

## 2021-09-17 NOTE — Anesthesia Procedure Notes (Signed)
Procedure Name: LMA Insertion Date/Time: 09/17/2021 7:29 AM Performed by: Lavonia Dana, CRNA Pre-anesthesia Checklist: Patient identified, Emergency Drugs available, Suction available and Patient being monitored Patient Re-evaluated:Patient Re-evaluated prior to induction Oxygen Delivery Method: Circle system utilized Preoxygenation: Pre-oxygenation with 100% oxygen Induction Type: IV induction Ventilation: Mask ventilation without difficulty LMA: LMA inserted LMA Size: 4.0 Number of attempts: 1 Airway Equipment and Method: Bite block Placement Confirmation: positive ETCO2 Tube secured with: Tape Dental Injury: Teeth and Oropharynx as per pre-operative assessment

## 2021-09-17 NOTE — Transfer of Care (Signed)
Immediate Anesthesia Transfer of Care Note  Patient: [name]  Procedure(s) Performed: ARTHROSCOPY KNEE WITH SYNOVECTOMY with partial menisectomy (Right: Knee)  Patient Location: PACU  Anesthesia Type:General  Level of Consciousness: drowsy  Airway & Oxygen Therapy: Patient Spontanous Breathing and Patient connected to face mask oxygen  Post-op Assessment: Report given to RN and Post -op Vital signs reviewed and stable  Post vital signs: Reviewed and stable  Last Vitals:  Vitals Value Taken Time  BP    Temp    Pulse 83 09/17/21 0815  Resp    SpO2 98 % 09/17/21 0815  Vitals shown include unvalidated device data.  Last Pain:  Vitals:   09/17/21 0645  TempSrc: Oral  PainSc: 10-Worst pain ever      Patients Stated Pain Goal: 5 (74/93/55 2174)  Complications: No notable events documented.

## 2021-09-17 NOTE — Interval H&P Note (Signed)
All questions answered, patient wants to proceed with procedure. ? ?

## 2021-09-17 NOTE — Anesthesia Preprocedure Evaluation (Signed)
Anesthesia Evaluation  Patient identified by MRN, date of birth, ID band Patient awake    Reviewed: Allergy & Precautions, NPO status , Patient's Chart, lab work & pertinent test results, reviewed documented beta blocker date and time   Airway Mallampati: III  TM Distance: >3 FB Neck ROM: Full    Dental no notable dental hx.    Pulmonary Current Smoker and Patient abstained from smoking.,    Pulmonary exam normal breath sounds clear to auscultation       Cardiovascular hypertension, Pt. on medications and Pt. on home beta blockers Normal cardiovascular exam Rhythm:Regular Rate:Normal  ECG: NSR, rate 75   Neuro/Psych  Headaches, Anxiety    GI/Hepatic Neg liver ROS, GERD  ,  Endo/Other  negative endocrine ROSdiabetes, Type 2  Renal/GU negative Renal ROS     Musculoskeletal  (+) Arthritis , Osteoarthritis,    Abdominal (+) + obese,   Peds  Hematology negative hematology ROS (+)   Anesthesia Other Findings HNP  Reproductive/Obstetrics S/p hysterectomy                              Anesthesia Physical  Anesthesia Plan  ASA: 3  Anesthesia Plan: General   Post-op Pain Management:    Induction: Intravenous  PONV Risk Score and Plan: 2 and Ondansetron, Midazolam and Treatment may vary due to age or medical condition  Airway Management Planned: LMA  Additional Equipment:   Intra-op Plan:   Post-operative Plan: Extubation in OR  Informed Consent: I have reviewed the patients History and Physical, chart, labs and discussed the procedure including the risks, benefits and alternatives for the proposed anesthesia with the patient or authorized representative who has indicated his/her understanding and acceptance.     Dental advisory given  Plan Discussed with: CRNA  Anesthesia Plan Comments:         Anesthesia Quick Evaluation

## 2021-09-17 NOTE — Anesthesia Postprocedure Evaluation (Signed)
Anesthesia Post Note  Patient: Merry Pond Volner  Procedure(s) Performed: ARTHROSCOPY KNEE WITH SYNOVECTOMY with partial menisectomy (Right: Knee)     Patient location during evaluation: PACU Anesthesia Type: General Level of consciousness: awake and alert Pain management: pain level controlled Vital Signs Assessment: post-procedure vital signs reviewed and stable Respiratory status: spontaneous breathing, nonlabored ventilation and respiratory function stable Cardiovascular status: blood pressure returned to baseline and stable Postop Assessment: no apparent nausea or vomiting Anesthetic complications: no   No notable events documented.  Last Vitals:  Vitals:   09/17/21 0845 09/17/21 0900  BP: (!) 180/93 (!) 175/99  Pulse: 66 83  Resp: 17 17  Temp:    SpO2: 100% 96%    Last Pain:  Vitals:   09/17/21 0845  TempSrc:   PainSc: Asleep                 Lynda Rainwater

## 2021-09-17 NOTE — Op Note (Signed)
Orthopaedic Surgery Operative Note (CSN: [id number])  [name]  05/21/1981 Date of Surgery: 09/17/2021   Diagnoses:  Right knee PVNS  Procedure: Right knee arthroscopic synovectomy and synovial biopsy Right knee partial medial meniscectomy Right knee loose body excision   Operative Finding Exam under anesthesia: Full motion no limitation Suprapatellar pouch: Significant burden of PVNS disease, multiple large loose areas that were removed Patellofemoral Compartment: Normal trochlea though there was grade 3 and 4 changes on the superior facet of the patella Medial Compartment: Normal cartilage essentially however there was a posterior root degenerative appearing tear.  This was debrided back to a stable base.  Previous partial medial meniscectomy is clearly been performed but only about 5 to 10% total meniscal volume was resected today Lateral Compartment: Normal Intercondylar Notch: Areas of PVNS in the anterior fat pad area as well as around the ACL.  The ACL was nutritionally eroded about 5 to 10% by PVNS disease.  We were careful to preserve as many fibers as possible.  We did not damage any of the existing fibers and there appeared to be existing sufficient ACL  Successful completion of the planned procedure.  Patient had significant burden of disease and we were able to go to the posterior aspect of the knee and clear disease as well.  We took a synovial biopsy.  Post-operative plan: The patient will be weightbearing to tolerance.  The patient will be discharged home.  DVT prophylaxis Aspirin 81 mg twice daily for 6 weeks.  Pain control with PRN pain medication preferring oral medicines.  Follow up plan will be scheduled in approximately 7 days for incision check.  Post-Op Diagnosis: Same Surgeons:Primary: Hiram Gash, MD Assistants:Caroline McBane PA-C Location: [facility] OR ROOM 6 Anesthesia: General with local Antibiotics: Ancef 2 g Tourniquet time:  Total Tourniquet  Time Documented: Thigh (Right) - 21 minutes Total: Thigh (Right) - 21 minutes  Estimated Blood Loss: Minimal Complications: None Specimens: 1 for pathology synovial tissue Implants: * No implants in log *  Indications for Surgery:   [name] is a 40 y.o. female with previous knee scope and continued pain with MRI findings consistent with PVNS.  Benefits and risks of operative and nonoperative management were discussed prior to surgery with patient/guardian(s) and informed consent form was completed.  Specific risks including infection, need for additional surgery, continued pain, stiffness, need for further surgery amongst others   Procedure:   The patient was identified properly. Informed consent was obtained and the surgical site was marked. The patient was taken up to suite where general anesthesia was induced. The patient was placed in the supine position with a post against the surgical leg and a nonsterile tourniquet applied. The surgical leg was then prepped and draped usual sterile fashion.  A standard surgical timeout was performed.  2 standard anterior portals were made and diagnostic arthroscopy performed. Please note the findings as noted above.  We cleared the anterior normal and used a combination of a shaver as well as a Apollo to clear PVNS tissue as well as obtain hemostasis.  Remove the loose body from the medial side of the joint.  1 x 1 cm.  At that point we sequentially cleared all compartments of the knee taken significant PVNS disease.  We used an Apollo to obtain hemostasis to be progressed.  We cleared the medial lateral side performed a partial medial meniscectomy with a shaver back to a stable base.  Went to the posterior aspect of the  knee and removed synovial tissue as well.  Incisions closed with absorbable suture. The patient was awoken from general anesthesia and taken to the PACU in stable condition without complication.   Noemi Chapel, PA-C,  present and scrubbed throughout the case, critical for completion in a timely fashion, and for retraction, instrumentation, closure.

## 2021-09-18 ENCOUNTER — Encounter (HOSPITAL_BASED_OUTPATIENT_CLINIC_OR_DEPARTMENT_OTHER): Payer: Self-pay | Admitting: Orthopaedic Surgery

## 2021-09-18 LAB — SURGICAL PATHOLOGY

## 2021-09-19 ENCOUNTER — Other Ambulatory Visit: Payer: Medicaid Other

## 2021-09-22 DIAGNOSIS — F4312 Post-traumatic stress disorder, chronic: Secondary | ICD-10-CM | POA: Diagnosis not present

## 2021-10-01 DIAGNOSIS — Z419 Encounter for procedure for purposes other than remedying health state, unspecified: Secondary | ICD-10-CM | POA: Diagnosis not present

## 2021-10-09 ENCOUNTER — Other Ambulatory Visit: Payer: Self-pay

## 2021-10-09 ENCOUNTER — Ambulatory Visit
Admission: RE | Admit: 2021-10-09 | Discharge: 2021-10-09 | Disposition: A | Discharge status: Home / Self Care | Payer: Medicaid Other | Source: Ambulatory Visit | Attending: Orthopedic Surgery | Admitting: Orthopedic Surgery

## 2021-10-09 DIAGNOSIS — M5416 Radiculopathy, lumbar region: Secondary | ICD-10-CM

## 2021-10-09 DIAGNOSIS — M5412 Radiculopathy, cervical region: Secondary | ICD-10-CM

## 2021-10-14 DIAGNOSIS — M5416 Radiculopathy, lumbar region: Secondary | ICD-10-CM | POA: Diagnosis not present

## 2021-10-14 DIAGNOSIS — M5126 Other intervertebral disc displacement, lumbar region: Secondary | ICD-10-CM | POA: Diagnosis not present

## 2021-10-16 DIAGNOSIS — M5416 Radiculopathy, lumbar region: Secondary | ICD-10-CM | POA: Diagnosis not present

## 2021-10-29 DIAGNOSIS — M5416 Radiculopathy, lumbar region: Secondary | ICD-10-CM | POA: Diagnosis not present

## 2021-10-30 ENCOUNTER — Telehealth: Payer: Self-pay

## 2021-10-30 NOTE — Telephone Encounter (Signed)
Patient calls nurse line regarding elevated BP reading at orthopedic specialist yesterday.  Patient reports that provider told her that BP was at stroke level, with systolic in the 607'P. Patient does not remember what diastolic reading was.   168/112- last night 153/103- this morning  Patient is asymptomatic. Patient reports compliance with medications. Patient reports that she believes BP is elevated due to unmanaged pain.   Scheduled patient for Tuesday, 1/3 at 8:30 with Dr. Erin Hearing.  ED precautions given.   Talbot Grumbling, RN

## 2021-11-01 DIAGNOSIS — Z419 Encounter for procedure for purposes other than remedying health state, unspecified: Secondary | ICD-10-CM | POA: Diagnosis not present

## 2021-11-03 ENCOUNTER — Encounter: Payer: Self-pay | Admitting: Family Medicine

## 2021-11-03 ENCOUNTER — Other Ambulatory Visit: Payer: Self-pay

## 2021-11-03 ENCOUNTER — Ambulatory Visit (INDEPENDENT_AMBULATORY_CARE_PROVIDER_SITE_OTHER): Payer: Medicaid Other | Admitting: Family Medicine

## 2021-11-03 VITALS — BP 182/104 | HR 91 | Wt 204.6 lb

## 2021-11-03 DIAGNOSIS — E119 Type 2 diabetes mellitus without complications: Secondary | ICD-10-CM | POA: Diagnosis not present

## 2021-11-03 DIAGNOSIS — I10 Essential (primary) hypertension: Secondary | ICD-10-CM

## 2021-11-03 LAB — POCT GLYCOSYLATED HEMOGLOBIN (HGB A1C): HbA1c, POC (controlled diabetic range): 7 % (ref 0.0–7.0)

## 2021-11-03 MED ORDER — LOSARTAN POTASSIUM-HCTZ 100-12.5 MG PO TABS: 1.0000 | ORAL_TABLET | Freq: Every day | ORAL | 3 refills | Status: AC

## 2021-11-03 MED ORDER — AMLODIPINE BESYLATE 10 MG PO TABS: 10.0000 mg | ORAL_TABLET | Freq: Every day | ORAL | 3 refills | Status: DC

## 2021-11-03 NOTE — Patient Instructions (Signed)
Good to see you today - Thank you for coming in  Things we discussed today:  Hypertension Stop your current medications Start Losartan Hctz - 1 daily Amlodipine - 10 mg - 1 daily  Your goal blood pressure is less than 140/90.  Check your blood pressure several times a week.  If regularly higher than this please let me know - either with MyChart or leaving a phone message. Next visit please bring in your blood pressure cuff.     Could discuss Lyrica or Nortriptyline with your back doctor   Please always bring your medication bottles   Come back to see Dr Owens Shark or me in 2-3 weeks

## 2021-11-03 NOTE — Progress Notes (Signed)
° ° °  SUBJECTIVE:   CHIEF COMPLAINT / HPI:   Hypertension  Patient called nurse line regarding elevated BP reading at orthopedic specialist yesterday.  Patient reports that provider told her that BP was at stroke level, with systolic in the 742'D. Patient does not remember what diastolic reading was.   168/112- last night 153/103- this morning   Patient is asymptomatic. Patient reports compliance with medications takes losartan 50 mg twice a day and amlodipine 31m daily.  No chest pain or edema or headache or visual changes Does have chronic severe pain from disk.  Has been getting steroid injections    PERTINENT  PMH / PSH: Hypertension, Diabetes, Depression  Lab Results  Component Value Date   NA 136 09/14/2021   K 4.7 09/14/2021   CO2 23 09/14/2021   GLUCOSE 128 (H) 09/14/2021   BUN 9 09/14/2021   CREATININE 0.47 09/14/2021   CALCIUM 8.9 09/14/2021   EGFR 119 01/28/2021   GFRNONAA >60 09/14/2021     OBJECTIVE:   BP (!) 182/104    Pulse 91    Wt 204 lb 9.6 oz (92.8 kg)    LMP 09/11/2017 (Approximate)    SpO2 98%    BMI 33.02 kg/m   Psych:  Cognition and judgment appear intact. Alert, communicative  and cooperative with normal attention span and concentration. No apparent delusions, illusions, hallucinations Walks with cane Heart - Regular rate and rhythm.  No murmurs, gallops or rubs.    Lungs:  Normal respiratory effort, chest expands symmetrically. Lungs are clear to auscultation, no crackles or wheezes. Extremities:  No cyanosis, edema, or deformity noted with good range of motion of all major joints.     ASSESSMENT/PLAN:   No problem-specific Assessment & Plan notes found for this encounter.     Lind Covert, MD [place]

## 2021-11-03 NOTE — Assessment & Plan Note (Signed)
Poorly controlled.  No signs of end organ acute damage.  Her pain may be exacerbating. Recent Bmet unremarkable.  Will add hctz with her losartan and increase amlodipine to 10 mg daily.  Asked her to monitor at home  And to notify us if not improving and to bring in her blood pressure cuff.  Close follow up in 2-3 weeks

## 2021-11-19 DIAGNOSIS — M5126 Other intervertebral disc displacement, lumbar region: Secondary | ICD-10-CM | POA: Diagnosis not present

## 2021-11-19 DIAGNOSIS — M5416 Radiculopathy, lumbar region: Secondary | ICD-10-CM | POA: Diagnosis not present

## 2021-12-02 DIAGNOSIS — Z419 Encounter for procedure for purposes other than remedying health state, unspecified: Secondary | ICD-10-CM | POA: Diagnosis not present

## 2021-12-04 DIAGNOSIS — M5126 Other intervertebral disc displacement, lumbar region: Secondary | ICD-10-CM | POA: Diagnosis not present

## 2021-12-07 ENCOUNTER — Other Ambulatory Visit: Payer: Self-pay | Admitting: Neurosurgery

## 2021-12-16 ENCOUNTER — Emergency Department (HOSPITAL_COMMUNITY)
Admission: EM | Admit: 2021-12-16 | Discharge: 2021-12-16 | Discharge status: Against Medical Advice | Payer: Medicaid Other | Attending: Emergency Medicine | Admitting: Emergency Medicine

## 2021-12-16 ENCOUNTER — Other Ambulatory Visit: Payer: Self-pay

## 2021-12-16 ENCOUNTER — Encounter (HOSPITAL_COMMUNITY): Payer: Self-pay | Admitting: *Deleted

## 2021-12-16 DIAGNOSIS — R609 Edema, unspecified: Secondary | ICD-10-CM | POA: Diagnosis not present

## 2021-12-16 DIAGNOSIS — Z5321 Procedure and treatment not carried out due to patient leaving prior to being seen by health care provider: Secondary | ICD-10-CM | POA: Diagnosis not present

## 2021-12-16 DIAGNOSIS — M545 Low back pain, unspecified: Secondary | ICD-10-CM | POA: Diagnosis not present

## 2021-12-16 DIAGNOSIS — M549 Dorsalgia, unspecified: Secondary | ICD-10-CM | POA: Diagnosis not present

## 2021-12-16 DIAGNOSIS — R682 Dry mouth, unspecified: Secondary | ICD-10-CM | POA: Insufficient documentation

## 2021-12-16 DIAGNOSIS — I10 Essential (primary) hypertension: Secondary | ICD-10-CM | POA: Diagnosis not present

## 2021-12-16 DIAGNOSIS — G8929 Other chronic pain: Secondary | ICD-10-CM | POA: Diagnosis not present

## 2021-12-16 DIAGNOSIS — R42 Dizziness and giddiness: Secondary | ICD-10-CM | POA: Diagnosis not present

## 2021-12-16 DIAGNOSIS — Z743 Need for continuous supervision: Secondary | ICD-10-CM | POA: Diagnosis not present

## 2021-12-16 NOTE — ED Provider Triage Note (Cosign Needed)
Emergency Medicine Provider Triage Evaluation Note  [name] , a 41 y.o. female  was evaluated in triage.  Pt complains of back pain.  Acute on chronic.  Worsened 30 minutes ago.  States the pain runs down her back.  States that she is scheduled to have surgery next week.  Review of Systems  Positive: Back pain Negative: Fever, chills  Physical Exam  BP (!) 149/109 (BP Location: Left Arm)    Pulse 87    Temp 98.1 F (36.7 C) (Oral)    Resp 17    Ht 5\' 6"  (1.676 m)    Wt 91.2 kg    LMP 09/11/2017 (Approximate)    SpO2 99%    BMI 32.44 kg/m  Gen:   Awake, no distress   Resp:  Normal effort  MSK:   Moves extremities without difficulty  Other:    Medical Decision Making  Medically screening exam initiated at 8:05 PM.  Appropriate orders placed.  Estrella Lorin Hockett was informed that the remainder of the evaluation will be completed by another provider, this initial triage assessment does not replace that evaluation, and the importance of remaining in the ED until their evaluation is complete.  Back pain   Montine Circle, PA-C 12/16/21 2006

## 2021-12-16 NOTE — ED Notes (Signed)
Pt continues to be missing from room, will remove from system

## 2021-12-16 NOTE — ED Triage Notes (Signed)
Pt says she was sitting on the couch watching tv. She smoked some weed and started having a burning sensation from her head down through her back. Pt says she has chronic back pain, but this is  different. Also c/o dry mouth.

## 2021-12-16 NOTE — ED Triage Notes (Signed)
Pt arrives via [facility] from home, per medic, she c/o Posterior head pain, neck pain, back pain. "Feels like it is on fire". Having surgery on her disc soon. Used cannabis today, c/o dry mouth. No n/v, cp or sob.

## 2021-12-16 NOTE — ED Provider Notes (Signed)
Patient could not be located after she was placed in examination room.  I did not see the patient.   Daleen Bo, MD 12/16/21 2226

## 2021-12-22 NOTE — Pre-Procedure Instructions (Signed)
Surgical Instructions    Your procedure is scheduled on 12/25/21.  Report to [facility] Main Entrance "A" at 11:40 A.M., then check in with the Admitting office.  Call this number if you have problems the morning of surgery:  [phone]   If you have any questions prior to your surgery date call [phone]: Open Monday-Friday 8am-4pm    Remember:  Do not eat or drink after midnight the night before your surgery    Take these medicines the morning of surgery with A SIP OF WATER:  amLODipine (NORVASC) celecoxib (CELEBREX)  FLUoxetine (PROZAC)    As of today, STOP taking any Aspirin (unless otherwise instructed by your surgeon) Aleve, Naproxen, Ibuprofen, Motrin, Advil, Goody's, BC's, all herbal medications, fish oil, and all vitamins.  WHAT DO I DO ABOUT MY DIABETES MEDICATION?   Do not take empagliflozin (JARDIANCE) the morning of surgery OR day prior  to surgery (12/24/21).  HOW TO MANAGE YOUR DIABETES BEFORE AND AFTER SURGERY  Why is it important to control my blood sugar before and after surgery? Improving blood sugar levels before and after surgery helps healing and can limit problems. A way of improving blood sugar control is eating a healthy diet by:  Eating less sugar and carbohydrates  Increasing activity/exercise  Talking with your doctor about reaching your blood sugar goals High blood sugars (greater than 180 mg/dL) can raise your risk of infections and slow your recovery, so you will need to focus on controlling your diabetes during the weeks before surgery. Make sure that the doctor who takes care of your diabetes knows about your planned surgery including the date and location.  How do I manage my blood sugar before surgery? Check your blood sugar at least 4 times a day, starting 2 days before surgery, to make sure that the level is not too high or low.  Check your blood sugar the morning of your surgery when you wake up and every 2 hours until you get to the  Short Stay unit.  If your blood sugar is less than 70 mg/dL, you will need to treat for low blood sugar: Do not take insulin. Treat a low blood sugar (less than 70 mg/dL) with  cup of clear juice (cranberry or apple), 4 glucose tablets, OR glucose gel. Recheck blood sugar in 15 minutes after treatment (to make sure it is greater than 70 mg/dL). If your blood sugar is not greater than 70 mg/dL on recheck, call [phone] for further instructions. Report your blood sugar to the short stay nurse when you get to Short Stay.  If you are admitted to the hospital after surgery: Your blood sugar will be checked by the staff and you will probably be given insulin after surgery (instead of oral diabetes medicines) to make sure you have good blood sugar levels. The goal for blood sugar control after surgery is 80-180 mg/dL.           Do not wear jewelry or makeup Do not wear lotions, powders, perfumes/colognes, or deodorant. Do not shave 48 hours prior to surgery.  Men may shave face and neck. Do not bring valuables to the hospital. Do not wear nail polish, gel polish, artificial nails, or any other type of covering on natural nails (fingers and toes) If you have artificial nails or gel coating that need to be removed by a nail salon, please have this removed prior to surgery. Artificial nails or gel coating may interfere with anesthesia's ability to adequately monitor your  vital signs.  [facility] is not responsible for any belongings or valuables. .   Do NOT Smoke (Tobacco/Vaping)  24 hours prior to your procedure  If you use a CPAP at night, you may bring your mask for your overnight stay.   Contacts, glasses, hearing aids, dentures or partials may not be worn into surgery, please bring cases for these belongings   For patients admitted to the hospital, discharge time will be determined by your treatment team.   Patients discharged the day of surgery will not be allowed to drive home, and  someone needs to stay with them for 24 hours.  NO VISITORS WILL BE ALLOWED IN PRE-OP WHERE PATIENTS ARE PREPPED FOR SURGERY.  ONLY 1 SUPPORT PERSON MAY BE PRESENT IN THE WAITING ROOM WHILE YOU ARE IN SURGERY.  IF YOU ARE TO BE ADMITTED, ONCE YOU ARE IN YOUR ROOM YOU WILL BE ALLOWED TWO (2) VISITORS. 1 (ONE) VISITOR MAY STAY OVERNIGHT BUT MUST ARRIVE TO THE ROOM BY 8pm.  Minor children may have two parents present. Special consideration for safety and communication needs will be reviewed on a case by case basis.  Special instructions:    Oral Hygiene is also important to reduce your risk of infection.  Remember - BRUSH YOUR TEETH THE MORNING OF SURGERY WITH YOUR REGULAR TOOTHPASTE   [facility]- Preparing For Surgery  Before surgery, you can play an important role. Because skin is not sterile, your skin needs to be as free of germs as possible. You can reduce the number of germs on your skin by washing with CHG (chlorahexidine gluconate) Soap before surgery.  CHG is an antiseptic cleaner which kills germs and bonds with the skin to continue killing germs even after washing.     Please do not use if you have an allergy to CHG or antibacterial soaps. If your skin becomes reddened/irritated stop using the CHG.  Do not shave (including legs and underarms) for at least 48 hours prior to first CHG shower. It is OK to shave your face.  Please follow these instructions carefully.     Shower the NIGHT BEFORE SURGERY and the MORNING OF SURGERY with CHG Soap.   If you chose to wash your hair, wash your hair first as usual with your normal shampoo. After you shampoo, rinse your hair and body thoroughly to remove the shampoo.  Then [employer] and genitals (private parts) with your normal soap and rinse thoroughly to remove soap.  After that Use CHG Soap as you would any other liquid soap. You can apply CHG directly to the skin and wash gently with a scrungie or a clean washcloth.   Apply the CHG Soap to  your body ONLY FROM THE NECK DOWN.  Do not use on open wounds or open sores. Avoid contact with your eyes, ears, mouth and genitals (private parts). Wash Face and genitals (private parts)  with your normal soap.   Wash thoroughly, paying special attention to the area where your surgery will be performed.  Thoroughly rinse your body with warm water from the neck down.  DO NOT shower/wash with your normal soap after using and rinsing off the CHG Soap.  Pat yourself dry with a CLEAN TOWEL.  Wear CLEAN PAJAMAS to bed the night before surgery  Place CLEAN SHEETS on your bed the night before your surgery  DO NOT SLEEP WITH PETS.   Day of Surgery:  Take a shower with CHG soap. Wear Clean/Comfortable clothing the morning  of surgery Do not apply any deodorants/lotions.   Remember to brush your teeth WITH YOUR REGULAR TOOTHPASTE.    COVID testing  If you are going to stay overnight or be admitted after your procedure/surgery and require a pre-op COVID test, please follow these instructions after your COVID test   You are not required to quarantine however you are required to wear a well-fitting mask when you are out and around people not in your household.  If your mask becomes wet or soiled, replace with a new one.  Wash your hands often with soap and water for 20 seconds or clean your hands with an alcohol-based hand sanitizer that contains at least 60% alcohol.  Do not share personal items.  Notify your provider: if you are in close contact with someone who has COVID  or if you develop a fever of 100.4 or greater, sneezing, cough, sore throat, shortness of breath or body aches.    Please read over the following fact sheets that you were given.

## 2021-12-23 ENCOUNTER — Other Ambulatory Visit: Payer: Self-pay

## 2021-12-23 ENCOUNTER — Encounter (HOSPITAL_COMMUNITY)
Admission: RE | Admit: 2021-12-23 | Discharge: 2021-12-23 | Disposition: A | Discharge status: Home / Self Care | Payer: Medicaid Other | Source: Ambulatory Visit | Attending: Neurosurgery | Admitting: Neurosurgery

## 2021-12-23 ENCOUNTER — Encounter (HOSPITAL_COMMUNITY): Payer: Self-pay

## 2021-12-23 VITALS — BP 165/102 | HR 73 | Temp 98.5°F | Resp 17 | Ht 66.0 in | Wt 204.0 lb

## 2021-12-23 DIAGNOSIS — I10 Essential (primary) hypertension: Secondary | ICD-10-CM | POA: Diagnosis not present

## 2021-12-23 DIAGNOSIS — Z01818 Encounter for other preprocedural examination: Secondary | ICD-10-CM

## 2021-12-23 DIAGNOSIS — Z01812 Encounter for preprocedural laboratory examination: Secondary | ICD-10-CM | POA: Insufficient documentation

## 2021-12-23 DIAGNOSIS — Z20822 Contact with and (suspected) exposure to covid-19: Secondary | ICD-10-CM | POA: Insufficient documentation

## 2021-12-23 LAB — BASIC METABOLIC PANEL
Anion gap: 10 (ref 5–15)
BUN: 8 mg/dL (ref 6–20)
CO2: 25 mmol/L (ref 22–32)
Calcium: 9.1 mg/dL (ref 8.9–10.3)
Chloride: 100 mmol/L (ref 98–111)
Creatinine, Ser: 0.66 mg/dL (ref 0.44–1.00)
GFR, Estimated: >60 mL/min (ref >60)
Glucose, Bld: 105 mg/dL — ABNORMAL HIGH (ref 70–99)
Potassium: 4.2 mmol/L (ref 3.5–5.1)
Sodium: 135 mmol/L (ref 135–145)

## 2021-12-23 LAB — SURGICAL PCR SCREEN
MRSA, PCR: NEGATIVE
Staphylococcus aureus: NEGATIVE

## 2021-12-23 LAB — SARS CORONAVIRUS 2 (TAT 6-24 HRS): SARS Coronavirus 2: NEGATIVE

## 2021-12-23 LAB — CBC
HCT: 43.6 % (ref 36.0–46.0)
Hemoglobin: 14.2 g/dL (ref 12.0–15.0)
MCH: 28.9 pg (ref 26.0–34.0)
MCHC: 32.6 g/dL (ref 30.0–36.0)
MCV: 88.8 fL (ref 80.0–100.0)
Platelets: 291 10*3/uL (ref 150–400)
RBC: 4.91 MIL/uL (ref 3.87–5.11)
RDW: 14.4 % (ref 11.5–15.5)
WBC: 11 10*3/uL — ABNORMAL HIGH (ref 4.0–10.5)
nRBC: 0 % (ref 0.0–0.2)

## 2021-12-23 LAB — GLUCOSE, CAPILLARY: Glucose-Capillary: 115 mg/dL — ABNORMAL HIGH (ref 70–99)

## 2021-12-23 NOTE — Progress Notes (Addendum)
PCP - Orvis Brill, DO at family practice in [place] Cardiologist - Denies  Chest x-ray - Not indicated EKG - 09/14/21 Stress Test - Denies ECHO - Denies Cardiac Cath - Denies  Sleep Study - Denies  DM - Type II Fasting Blood Sugar - at PAT appt 115 Checks Blood Sugar __Does not check   COVID TEST- 12/23/21   Anesthesia review: No However I did talk to [facility] regarding patient's elevated B/P  Patient denies shortness of breath, fever, cough and chest pain at PAT appointment  Patient's B/P was elevated and she stated she was in pain 8/10, hadn't taken her B/P medication yet and had recently switched B/P medications. She is no longer taking the Norvasc and taking Hyzaar.  I told her to not take the Hyzaar on day of surgery and to monitor her pressure at home and if needed contact her dr office.   All instructions explained to the patient, with a verbal understanding of the material. Patient agrees to go over the instructions while at home for a better understanding. Patient also instructed to wear a mask while in public after being tested for COVID-19. The opportunity to ask questions was provided.

## 2021-12-28 ENCOUNTER — Other Ambulatory Visit: Payer: Self-pay | Admitting: Neurosurgery

## 2021-12-30 DIAGNOSIS — Z419 Encounter for procedure for purposes other than remedying health state, unspecified: Secondary | ICD-10-CM | POA: Diagnosis not present

## 2021-12-31 ENCOUNTER — Other Ambulatory Visit: Payer: Self-pay

## 2021-12-31 ENCOUNTER — Encounter (HOSPITAL_COMMUNITY): Payer: Self-pay | Admitting: Neurosurgery

## 2021-12-31 NOTE — Pre-Procedure Instructions (Addendum)
PCP - Dr. Owens Shark  ?Cardiologist - denies  ?EKG - 09/14/21 ?Chest x-ray -  ?ECHO -  ?Cardiac Cath -  ?CPAP -  ? ?ERAS Protcol -  ?COVID TEST- DOS ? ?Anesthesia review: n/a ? ? ?Surgical Instructions ? ? ? Your procedure is scheduled on 3/3 Friday  ? Report to [facility] Main Entrance "A" at 11:20 A.M., then check in with the Admitting office. ? Call this number if you have problems the morning of surgery: ? [phone] ? ? If you have any questions prior to your surgery date call [phone]: Open Monday-Friday 8am-4pm ? ? Remember: ? Do not eat or drink after midnight the night before your surgery ? ? ? Take these medicines the morning of surgery with A SIP OF WATER:  ?FLUoxetine (PROZAC)  ? ? ?As of today, STOP taking any Aspirin (unless otherwise instructed by your surgeon) Aleve, Naproxen, Ibuprofen, Motrin, Advil, Goody's, BC's, all herbal medications, fish oil, and all vitamins. ? ?WHAT DO I DO ABOUT MY DIABETES MEDICATION? ? ?Do not take Jardiance 3/2 or 3/3 (day of surgery)  ? ?HOW TO MANAGE YOUR DIABETES ?BEFORE AND AFTER SURGERY ? ?How do I manage my blood sugar before surgery? ?Check your blood sugar at least 4 times a day, starting 2 days before surgery, to make sure that the level is not too high or low. ? ?Check your blood sugar the morning of your surgery when you wake up and every 2 hours until you get to the Short Stay unit. ? ?If your blood sugar is less than 70 mg/dL, you will need to treat for low blood sugar: ?Do not take insulin. ?Treat a low blood sugar (less than 70 mg/dL) with ? cup of clear juice (cranberry or apple), 4 glucose tablets, OR glucose gel. ?Recheck blood sugar in 15 minutes after treatment (to make sure it is greater than 70 mg/dL). If your blood sugar is not greater than 70 mg/dL on recheck, call [phone] for further instructions. ?Report your blood sugar to the short stay nurse when you get to Short Stay. ? ?If you are admitted to the hospital after surgery: ?Your blood  sugar will be checked by the staff and you will probably be given insulin after surgery (instead of oral diabetes medicines) to make sure you have good blood sugar levels. ?The goal for blood sugar control after surgery is 80-180 mg/dL.  ?         ?Do not wear jewelry or makeup ?Do not wear lotions, powders, perfumes, or deodorant. ?Do not bring valuables to the hospital. ?Do not wear nail polish, gel polish, artificial nails, or any other type of covering on natural nails (fingers and toes) ?If you have artificial nails or gel coating that need to be removed by a nail salon, please have this removed prior to surgery. Artificial nails or gel coating may interfere with anesthesia's ability to adequately monitor your vital signs. ? ?[facility] is not responsible for any belongings or valuables. .  ? ?Do NOT Smoke (Tobacco/Vaping)  24 hours prior to your procedure ? ?If you use a CPAP at night, you may bring your mask for your overnight stay. ?  ?Contacts, glasses, hearing aids, dentures or partials may not be worn into surgery, please bring cases for these belongings ?  ?For patients admitted to the hospital, discharge time will be determined by your treatment team. ?  ?Patients discharged the day of surgery will not be allowed to drive home, and someone  needs to stay with them for 24 hours. ? ?NO VISITORS WILL BE ALLOWED IN PRE-OP WHERE PATIENTS ARE PREPPED FOR SURGERY.  ONLY 1 SUPPORT PERSON MAY BE PRESENT IN THE WAITING ROOM WHILE YOU ARE IN SURGERY.  IF YOU ARE TO BE ADMITTED, ONCE YOU ARE IN YOUR ROOM YOU WILL BE ALLOWED TWO (2) VISITORS. 1 (ONE) VISITOR MAY STAY OVERNIGHT BUT MUST ARRIVE TO THE ROOM BY 8pm.  Minor children may have two parents present. Special consideration for safety and communication needs will be reviewed on a case by case basis. ? ?Special instructions:   ? ?Oral Hygiene is also important to reduce your risk of infection.  Remember - BRUSH YOUR TEETH THE MORNING OF SURGERY WITH YOUR REGULAR  TOOTHPASTE ? ? ?[facility]- Preparing For Surgery ? ?Before surgery, you can play an important role. Because skin is not sterile, your skin needs to be as free of germs as possible. You can reduce the number of germs on your skin by washing with CHG (chlorahexidine gluconate) Soap before surgery.  CHG is an antiseptic cleaner which kills germs and bonds with the skin to continue killing germs even after washing.   ? ? ?Please do not use if you have an allergy to CHG or antibacterial soaps. If your skin becomes reddened/irritated stop using the CHG.  ?Do not shave (including legs and underarms) for at least 48 hours prior to first CHG shower. It is OK to shave your face. ? ?Please follow these instructions carefully. ?  ? ? Shower the NIGHT BEFORE SURGERY and the MORNING OF SURGERY with CHG Soap.  ? If you chose to wash your hair, wash your hair first as usual with your normal shampoo. After you shampoo, rinse your hair and body thoroughly to remove the shampoo.  Then [employer] and genitals (private parts) with your normal soap and rinse thoroughly to remove soap. ? ?After that Use CHG Soap as you would any other liquid soap. You can apply CHG directly to the skin and wash gently with a scrungie or a clean washcloth.  ? ?Apply the CHG Soap to your body ONLY FROM THE NECK DOWN.  Do not use on open wounds or open sores. Avoid contact with your eyes, ears, mouth and genitals (private parts). Wash Face and genitals (private parts)  with your normal soap.  ? ?Wash thoroughly, paying special attention to the area where your surgery will be performed. ? ?Thoroughly rinse your body with warm water from the neck down. ? ?DO NOT shower/wash with your normal soap after using and rinsing off the CHG Soap. ? ?Pat yourself dry with a CLEAN TOWEL. ? ?Wear CLEAN PAJAMAS to bed the night before surgery ? ?Place CLEAN SHEETS on your bed the night before your surgery ? ?DO NOT SLEEP WITH PETS. ? ? ?Day of Surgery: ? ?Take a shower  with CHG soap. ?Wear Clean/Comfortable clothing the morning of surgery ?Do not apply any deodorants/lotions.   ?Remember to brush your teeth WITH YOUR REGULAR TOOTHPASTE. ? ? ? ?COVID testing ? ?If you are going to stay overnight or be admitted after your procedure/surgery and require a pre-op COVID test, please follow these instructions after your COVID test  ? ?You are not required to quarantine however you are required to wear a well-fitting mask when you are out and around people not in your household.  If your mask becomes wet or soiled, replace with a new one. ? ?Wash your hands often with soap  and water for 20 seconds or clean your hands with an alcohol-based hand sanitizer that contains at least 60% alcohol. ? ?Do not share personal items. ? ?Notify your provider: ?if you are in close contact with someone who has COVID  ?or if you develop a fever of 100.4 or greater, sneezing, cough, sore throat, shortness of breath or body aches. ? ?  ?Please read over the following fact sheets that you were given.  ? ?

## 2022-01-01 ENCOUNTER — Ambulatory Visit (HOSPITAL_COMMUNITY)
Admission: RE | Disposition: A | Discharge status: Home / Self Care | Payer: Self-pay | Source: Home / Self Care | Attending: Neurosurgery

## 2022-01-01 ENCOUNTER — Encounter (HOSPITAL_COMMUNITY): Payer: Self-pay | Admitting: Neurosurgery

## 2022-01-01 ENCOUNTER — Ambulatory Visit (HOSPITAL_COMMUNITY)
Admission: RE | Admit: 2022-01-01 | Discharge: 2022-01-02 | Disposition: A | Discharge status: Home / Self Care | Payer: Medicaid Other | Attending: Neurosurgery | Admitting: Neurosurgery

## 2022-01-01 ENCOUNTER — Ambulatory Visit (HOSPITAL_BASED_OUTPATIENT_CLINIC_OR_DEPARTMENT_OTHER): Payer: Medicaid Other | Admitting: Anesthesiology

## 2022-01-01 ENCOUNTER — Ambulatory Visit (HOSPITAL_COMMUNITY): Payer: Medicaid Other | Admitting: Vascular Surgery

## 2022-01-01 ENCOUNTER — Ambulatory Visit (HOSPITAL_COMMUNITY): Payer: Medicaid Other

## 2022-01-01 ENCOUNTER — Other Ambulatory Visit: Payer: Self-pay

## 2022-01-01 DIAGNOSIS — F17210 Nicotine dependence, cigarettes, uncomplicated: Secondary | ICD-10-CM | POA: Insufficient documentation

## 2022-01-01 DIAGNOSIS — Z79899 Other long term (current) drug therapy: Secondary | ICD-10-CM | POA: Insufficient documentation

## 2022-01-01 DIAGNOSIS — Z9889 Other specified postprocedural states: Secondary | ICD-10-CM | POA: Diagnosis not present

## 2022-01-01 DIAGNOSIS — I10 Essential (primary) hypertension: Secondary | ICD-10-CM | POA: Diagnosis not present

## 2022-01-01 DIAGNOSIS — M5126 Other intervertebral disc displacement, lumbar region: Secondary | ICD-10-CM | POA: Diagnosis present

## 2022-01-01 DIAGNOSIS — F419 Anxiety disorder, unspecified: Secondary | ICD-10-CM | POA: Diagnosis not present

## 2022-01-01 DIAGNOSIS — Z20822 Contact with and (suspected) exposure to covid-19: Secondary | ICD-10-CM | POA: Diagnosis not present

## 2022-01-01 DIAGNOSIS — Z01818 Encounter for other preprocedural examination: Secondary | ICD-10-CM | POA: Diagnosis not present

## 2022-01-01 DIAGNOSIS — M1990 Unspecified osteoarthritis, unspecified site: Secondary | ICD-10-CM | POA: Insufficient documentation

## 2022-01-01 DIAGNOSIS — F32A Depression, unspecified: Secondary | ICD-10-CM | POA: Diagnosis not present

## 2022-01-01 DIAGNOSIS — M5117 Intervertebral disc disorders with radiculopathy, lumbosacral region: Secondary | ICD-10-CM | POA: Diagnosis not present

## 2022-01-01 DIAGNOSIS — Z419 Encounter for procedure for purposes other than remedying health state, unspecified: Secondary | ICD-10-CM

## 2022-01-01 HISTORY — PX: LUMBAR LAMINECTOMY/DECOMPRESSION MICRODISCECTOMY: SHX5026

## 2022-01-01 LAB — GLUCOSE, CAPILLARY
Glucose-Capillary: 118 mg/dL — ABNORMAL HIGH (ref 70–99)
Glucose-Capillary: 103 mg/dL — ABNORMAL HIGH (ref 70–99)
Glucose-Capillary: 112 mg/dL — ABNORMAL HIGH (ref 70–99)
Glucose-Capillary: 149 mg/dL — ABNORMAL HIGH (ref 70–99)
Glucose-Capillary: 316 mg/dL — ABNORMAL HIGH (ref 70–99)

## 2022-01-01 LAB — SARS CORONAVIRUS 2 BY RT PCR (HOSPITAL ORDER, PERFORMED IN ~~LOC~~ HOSPITAL LAB): SARS Coronavirus 2: NEGATIVE

## 2022-01-01 SURGERY — LUMBAR LAMINECTOMY/DECOMPRESSION MICRODISCECTOMY 1 LEVEL
Anesthesia: General | Site: Back | Laterality: Right

## 2022-01-01 MED ORDER — VANCOMYCIN HCL IN DEXTROSE 1-5 GM/200ML-% IV SOLN: 1000.0000 mg | INTRAVENOUS | Status: AC

## 2022-01-01 MED ORDER — LIDOCAINE-EPINEPHRINE 1 %-1:100000 IJ SOLN
INTRAMUSCULAR | Status: AC
  Filled 2022-01-01: qty 1

## 2022-01-01 MED ORDER — SODIUM CHLORIDE 0.9% FLUSH: 3.0000 mL | INTRAVENOUS | Status: DC | PRN

## 2022-01-01 MED ORDER — PROPOFOL 10 MG/ML IV BOLUS
INTRAVENOUS | Status: AC
  Filled 2022-01-01: qty 20

## 2022-01-01 MED ORDER — INSULIN ASPART 100 UNIT/ML IJ SOLN: 0.0000 [IU] | INTRAMUSCULAR | Status: DC | PRN

## 2022-01-01 MED ORDER — ROCURONIUM BROMIDE 10 MG/ML (PF) SYRINGE
PREFILLED_SYRINGE | INTRAVENOUS | Status: DC | PRN
Start: 2022-01-01 — End: 2022-01-01
  Administered 2022-01-01: 70 mg via INTRAVENOUS

## 2022-01-01 MED ORDER — PHENYLEPHRINE HCL-NACL 20-0.9 MG/250ML-% IV SOLN
INTRAVENOUS | Status: DC | PRN
Start: 2022-01-01 — End: 2022-01-01
  Administered 2022-01-01: 25 ug/min via INTRAVENOUS

## 2022-01-01 MED ORDER — CHLORHEXIDINE GLUCONATE CLOTH 2 % EX PADS: 6.0000 | MEDICATED_PAD | Freq: Once | CUTANEOUS | Status: DC

## 2022-01-01 MED ORDER — VANCOMYCIN HCL IN DEXTROSE 1-5 GM/200ML-% IV SOLN
1000.0000 mg | Freq: Once | INTRAVENOUS | Status: AC
  Administered 2022-01-01: 1000 mg via INTRAVENOUS
  Filled 2022-01-01: qty 200

## 2022-01-01 MED ORDER — ONDANSETRON HCL 4 MG/2ML IJ SOLN
INTRAMUSCULAR | Status: DC | PRN
  Administered 2022-01-01: 4 mg via INTRAVENOUS

## 2022-01-01 MED ORDER — AMLODIPINE BESYLATE 5 MG PO TABS
10.0000 mg | ORAL_TABLET | Freq: Every day | ORAL | Status: DC
  Administered 2022-01-01: 10 mg via ORAL
  Filled 2022-01-01: qty 2

## 2022-01-01 MED ORDER — ACETAMINOPHEN 160 MG/5ML PO SOLN: 1000.0000 mg | Freq: Once | ORAL | Status: DC | PRN

## 2022-01-01 MED ORDER — ACETAMINOPHEN 650 MG RE SUPP: 650.0000 mg | RECTAL | Status: DC | PRN

## 2022-01-01 MED ORDER — INSULIN ASPART 100 UNIT/ML IJ SOLN
0.0000 [IU] | Freq: Three times a day (TID) | INTRAMUSCULAR | Status: DC
  Administered 2022-01-02: 3 [IU] via SUBCUTANEOUS

## 2022-01-01 MED ORDER — HYDROCODONE-ACETAMINOPHEN 5-325 MG PO TABS
2.0000 | ORAL_TABLET | ORAL | Status: DC | PRN
  Administered 2022-01-01 – 2022-01-02 (×3): 2 via ORAL
  Filled 2022-01-01 (×3): qty 2

## 2022-01-01 MED ORDER — PHENYLEPHRINE 40 MCG/ML (10ML) SYRINGE FOR IV PUSH (FOR BLOOD PRESSURE SUPPORT)
PREFILLED_SYRINGE | INTRAVENOUS | Status: DC | PRN
  Administered 2022-01-01 (×3): 80 ug via INTRAVENOUS

## 2022-01-01 MED ORDER — FLUOXETINE HCL 20 MG PO CAPS
40.0000 mg | ORAL_CAPSULE | Freq: Every day | ORAL | Status: DC
  Administered 2022-01-01 – 2022-01-02 (×2): 40 mg via ORAL
  Filled 2022-01-01 (×2): qty 2

## 2022-01-01 MED ORDER — FENTANYL CITRATE (PF) 250 MCG/5ML IJ SOLN
INTRAMUSCULAR | Status: AC
  Filled 2022-01-01: qty 5

## 2022-01-01 MED ORDER — LIDOCAINE 2% (20 MG/ML) 5 ML SYRINGE
INTRAMUSCULAR | Status: DC | PRN
  Administered 2022-01-01: 60 mg via INTRAVENOUS

## 2022-01-01 MED ORDER — EMPAGLIFLOZIN 10 MG PO TABS
10.0000 mg | ORAL_TABLET | Freq: Every day | ORAL | Status: DC
  Administered 2022-01-01 – 2022-01-02 (×2): 10 mg via ORAL
  Filled 2022-01-01 (×2): qty 1

## 2022-01-01 MED ORDER — ACETAMINOPHEN 10 MG/ML IV SOLN
INTRAVENOUS | Status: DC | PRN
  Administered 2022-01-01: 1000 mg via INTRAVENOUS

## 2022-01-01 MED ORDER — HEMOSTATIC AGENTS (NO CHARGE) OPTIME
TOPICAL | Status: DC | PRN
  Administered 2022-01-01: 1 via TOPICAL

## 2022-01-01 MED ORDER — LOSARTAN POTASSIUM-HCTZ 100-12.5 MG PO TABS: 1.0000 | ORAL_TABLET | Freq: Every day | ORAL | Status: DC

## 2022-01-01 MED ORDER — ALUM & MAG HYDROXIDE-SIMETH 200-200-20 MG/5ML PO SUSP: 30.0000 mL | Freq: Four times a day (QID) | ORAL | Status: DC | PRN

## 2022-01-01 MED ORDER — HYDROMORPHONE HCL 1 MG/ML IJ SOLN
0.5000 mg | INTRAMUSCULAR | Status: DC | PRN
  Administered 2022-01-01: 0.5 mg via INTRAVENOUS
  Filled 2022-01-01: qty 0.5

## 2022-01-01 MED ORDER — ACETAMINOPHEN 10 MG/ML IV SOLN: 1000.0000 mg | Freq: Once | INTRAVENOUS | Status: DC | PRN

## 2022-01-01 MED ORDER — LIDOCAINE-EPINEPHRINE 1 %-1:100000 IJ SOLN
INTRAMUSCULAR | Status: DC | PRN
  Administered 2022-01-01: 10 mL

## 2022-01-01 MED ORDER — SUMATRIPTAN SUCCINATE 50 MG PO TABS
50.0000 mg | ORAL_TABLET | ORAL | Status: DC | PRN
  Filled 2022-01-01: qty 1

## 2022-01-01 MED ORDER — DEXAMETHASONE SODIUM PHOSPHATE 10 MG/ML IJ SOLN
INTRAMUSCULAR | Status: DC | PRN
Start: 2022-01-01 — End: 2022-01-01
  Administered 2022-01-01: 10 mg via INTRAVENOUS

## 2022-01-01 MED ORDER — FENTANYL CITRATE (PF) 100 MCG/2ML IJ SOLN
INTRAMUSCULAR | Status: AC
  Filled 2022-01-01: qty 2

## 2022-01-01 MED ORDER — OXYCODONE HCL 5 MG PO TABS: 5.0000 mg | ORAL_TABLET | Freq: Once | ORAL | Status: DC | PRN

## 2022-01-01 MED ORDER — PANTOPRAZOLE SODIUM 40 MG IV SOLR: 40.0000 mg | Freq: Every day | INTRAVENOUS | Status: DC

## 2022-01-01 MED ORDER — VANCOMYCIN HCL IN DEXTROSE 1-5 GM/200ML-% IV SOLN
INTRAVENOUS | Status: AC
  Administered 2022-01-01: 1000 mg via INTRAVENOUS
  Filled 2022-01-01: qty 200

## 2022-01-01 MED ORDER — LOSARTAN POTASSIUM 50 MG PO TABS
100.0000 mg | ORAL_TABLET | Freq: Every day | ORAL | Status: DC
  Administered 2022-01-02: 100 mg via ORAL
  Filled 2022-01-01: qty 2

## 2022-01-01 MED ORDER — CELECOXIB 100 MG PO CAPS
100.0000 mg | ORAL_CAPSULE | Freq: Two times a day (BID) | ORAL | Status: DC
  Filled 2022-01-01: qty 1

## 2022-01-01 MED ORDER — CYCLOBENZAPRINE HCL 10 MG PO TABS
10.0000 mg | ORAL_TABLET | Freq: Three times a day (TID) | ORAL | Status: DC | PRN
  Administered 2022-01-01 – 2022-01-02 (×2): 10 mg via ORAL
  Filled 2022-01-01 (×2): qty 1

## 2022-01-01 MED ORDER — INSULIN ASPART 100 UNIT/ML IJ SOLN
0.0000 [IU] | Freq: Every day | INTRAMUSCULAR | Status: DC
  Administered 2022-01-01: 4 [IU] via SUBCUTANEOUS

## 2022-01-01 MED ORDER — ONDANSETRON HCL 4 MG/2ML IJ SOLN: 4.0000 mg | Freq: Four times a day (QID) | INTRAMUSCULAR | Status: DC | PRN

## 2022-01-01 MED ORDER — PANTOPRAZOLE SODIUM 40 MG PO TBEC
40.0000 mg | DELAYED_RELEASE_TABLET | Freq: Every day | ORAL | Status: DC
Start: 2022-01-01 — End: 2022-01-02
  Administered 2022-01-01: 40 mg via ORAL
  Filled 2022-01-01: qty 1

## 2022-01-01 MED ORDER — PHENOL 1.4 % MT LIQD: 1.0000 | OROMUCOSAL | Status: DC | PRN

## 2022-01-01 MED ORDER — VANCOMYCIN HCL 1000 MG IV SOLR
INTRAVENOUS | Status: DC | PRN
  Administered 2022-01-01: 1000 mg via INTRAVENOUS

## 2022-01-01 MED ORDER — BUPIVACAINE HCL (PF) 0.25 % IJ SOLN
INTRAMUSCULAR | Status: AC
  Filled 2022-01-01: qty 30

## 2022-01-01 MED ORDER — SODIUM CHLORIDE 0.9% FLUSH: 3.0000 mL | Freq: Two times a day (BID) | INTRAVENOUS | Status: DC

## 2022-01-01 MED ORDER — ACETAMINOPHEN 10 MG/ML IV SOLN
INTRAVENOUS | Status: AC
  Filled 2022-01-01: qty 100

## 2022-01-01 MED ORDER — LIDOCAINE 5 % EX PTCH: 1.0000 | MEDICATED_PATCH | CUTANEOUS | Status: DC

## 2022-01-01 MED ORDER — SODIUM CHLORIDE 0.9 % IV SOLN: 250.0000 mL | INTRAVENOUS | Status: DC

## 2022-01-01 MED ORDER — ORAL CARE MOUTH RINSE: 15.0000 mL | Freq: Once | OROMUCOSAL | Status: AC

## 2022-01-01 MED ORDER — CHLORHEXIDINE GLUCONATE 0.12 % MT SOLN: 15.0000 mL | Freq: Once | OROMUCOSAL | Status: AC

## 2022-01-01 MED ORDER — BUPIVACAINE HCL (PF) 0.25 % IJ SOLN
INTRAMUSCULAR | Status: DC | PRN
  Administered 2022-01-01: 10 mL

## 2022-01-01 MED ORDER — 0.9 % SODIUM CHLORIDE (POUR BTL) OPTIME
TOPICAL | Status: DC | PRN
Start: 2022-01-01 — End: 2022-01-01
  Administered 2022-01-01: 1000 mL

## 2022-01-01 MED ORDER — SUGAMMADEX SODIUM 200 MG/2ML IV SOLN
INTRAVENOUS | Status: DC | PRN
Start: 2022-01-01 — End: 2022-01-01
  Administered 2022-01-01: 400 mg via INTRAVENOUS

## 2022-01-01 MED ORDER — LIDOCAINE 2% (20 MG/ML) 5 ML SYRINGE
INTRAMUSCULAR | Status: AC
  Filled 2022-01-01: qty 5

## 2022-01-01 MED ORDER — PROPOFOL 10 MG/ML IV BOLUS
INTRAVENOUS | Status: DC | PRN
  Administered 2022-01-01: 200 mg via INTRAVENOUS

## 2022-01-01 MED ORDER — FENTANYL CITRATE (PF) 250 MCG/5ML IJ SOLN
INTRAMUSCULAR | Status: DC | PRN
Start: 2022-01-01 — End: 2022-01-01
  Administered 2022-01-01: 150 ug via INTRAVENOUS

## 2022-01-01 MED ORDER — ROCURONIUM BROMIDE 10 MG/ML (PF) SYRINGE
PREFILLED_SYRINGE | INTRAVENOUS | Status: AC
  Filled 2022-01-01: qty 10

## 2022-01-01 MED ORDER — KETOROLAC TROMETHAMINE 30 MG/ML IJ SOLN
INTRAMUSCULAR | Status: DC | PRN
  Administered 2022-01-01: 30 mg via INTRAVENOUS

## 2022-01-01 MED ORDER — FENTANYL CITRATE (PF) 100 MCG/2ML IJ SOLN
25.0000 ug | INTRAMUSCULAR | Status: DC | PRN
  Administered 2022-01-01: 50 ug via INTRAVENOUS

## 2022-01-01 MED ORDER — THROMBIN 5000 UNITS EX SOLR
CUTANEOUS | Status: AC
  Filled 2022-01-01: qty 10000

## 2022-01-01 MED ORDER — DEXAMETHASONE SODIUM PHOSPHATE 10 MG/ML IJ SOLN
INTRAMUSCULAR | Status: AC
  Filled 2022-01-01: qty 1

## 2022-01-01 MED ORDER — ACETAMINOPHEN 500 MG PO TABS: 1000.0000 mg | ORAL_TABLET | Freq: Once | ORAL | Status: DC | PRN

## 2022-01-01 MED ORDER — OXYCODONE HCL 5 MG/5ML PO SOLN: 5.0000 mg | Freq: Once | ORAL | Status: DC | PRN

## 2022-01-01 MED ORDER — ONDANSETRON HCL 4 MG/2ML IJ SOLN
INTRAMUSCULAR | Status: AC
  Filled 2022-01-01: qty 2

## 2022-01-01 MED ORDER — LACTATED RINGERS IV SOLN: INTRAVENOUS | Status: DC

## 2022-01-01 MED ORDER — HYDROCHLOROTHIAZIDE 12.5 MG PO TABS
12.5000 mg | ORAL_TABLET | Freq: Every day | ORAL | Status: DC
  Administered 2022-01-01 – 2022-01-02 (×2): 12.5 mg via ORAL
  Filled 2022-01-01 (×2): qty 1

## 2022-01-01 MED ORDER — MENTHOL 3 MG MT LOZG: 1.0000 | LOZENGE | OROMUCOSAL | Status: DC | PRN

## 2022-01-01 MED ORDER — MIDAZOLAM HCL 5 MG/5ML IJ SOLN
INTRAMUSCULAR | Status: DC | PRN
Start: 2022-01-01 — End: 2022-01-01
  Administered 2022-01-01: 2 mg via INTRAVENOUS

## 2022-01-01 MED ORDER — ACETAMINOPHEN 325 MG PO TABS: 650.0000 mg | ORAL_TABLET | ORAL | Status: DC | PRN

## 2022-01-01 MED ORDER — MIDAZOLAM HCL 2 MG/2ML IJ SOLN
INTRAMUSCULAR | Status: AC
  Filled 2022-01-01: qty 2

## 2022-01-01 MED ORDER — CHLORHEXIDINE GLUCONATE 0.12 % MT SOLN
OROMUCOSAL | Status: AC
  Administered 2022-01-01: 15 mL via OROMUCOSAL
  Filled 2022-01-01: qty 15

## 2022-01-01 MED ORDER — ONDANSETRON HCL 4 MG PO TABS: 4.0000 mg | ORAL_TABLET | Freq: Four times a day (QID) | ORAL | Status: DC | PRN

## 2022-01-01 SURGICAL SUPPLY — 55 items
BAG COUNTER SPONGE SURGICOUNT (BAG) ×2 IMPLANT
BAND RUBBER #18 3X1/16 STRL (MISCELLANEOUS) ×4 IMPLANT
BENZOIN TINCTURE PRP APPL 2/3 (GAUZE/BANDAGES/DRESSINGS) ×2 IMPLANT
BLADE CLIPPER SURG (BLADE) IMPLANT
BLADE SURG 11 STRL SS (BLADE) ×2 IMPLANT
BUR CUTTER 7.0 ROUND (BURR) ×2 IMPLANT
BUR MATCHSTICK NEURO 3.0 LAGG (BURR) ×2 IMPLANT
CANISTER SUCT 3000ML PPV (MISCELLANEOUS) ×2 IMPLANT
CARTRIDGE OIL MAESTRO DRILL (MISCELLANEOUS) ×1 IMPLANT
CLSR STERI-STRIP ANTIMIC 1/2X4 (GAUZE/BANDAGES/DRESSINGS) ×1 IMPLANT
DECANTER SPIKE VIAL GLASS SM (MISCELLANEOUS) ×1 IMPLANT
DERMABOND ADVANCED (GAUZE/BANDAGES/DRESSINGS) ×2
DERMABOND ADVANCED .7 DNX12 (GAUZE/BANDAGES/DRESSINGS) ×1 IMPLANT
DIFFUSER DRILL AIR PNEUMATIC (MISCELLANEOUS) ×2 IMPLANT
DRAPE HALF SHEET 40X57 (DRAPES) IMPLANT
DRAPE LAPAROTOMY 100X72X124 (DRAPES) ×2 IMPLANT
DRAPE MICROSCOPE LEICA (MISCELLANEOUS) ×2 IMPLANT
DRAPE SURG 17X23 STRL (DRAPES) ×2 IMPLANT
DRSG OPSITE POSTOP 4X6 (GAUZE/BANDAGES/DRESSINGS) ×2 IMPLANT
DURAPREP 26ML APPLICATOR (WOUND CARE) ×2 IMPLANT
ELECT REM PT RETURN 9FT ADLT (ELECTROSURGICAL) ×2
ELECTRODE REM PT RTRN 9FT ADLT (ELECTROSURGICAL) ×1 IMPLANT
GAUZE 4X4 16PLY ~~LOC~~+RFID DBL (SPONGE) IMPLANT
GAUZE SPONGE 4X4 12PLY STRL (GAUZE/BANDAGES/DRESSINGS) ×3 IMPLANT
GLOVE EXAM NITRILE XL STR (GLOVE) IMPLANT
GLOVE SURG ENC MOIS LTX SZ7 (GLOVE) ×3 IMPLANT
GLOVE SURG ENC MOIS LTX SZ8 (GLOVE) ×2 IMPLANT
GLOVE SURG MICRO LTX SZ7.5 (GLOVE) ×3 IMPLANT
GLOVE SURG POLYISO LF SZ7 (GLOVE) ×1 IMPLANT
GLOVE SURG POLYISO LF SZ8 (GLOVE) ×1 IMPLANT
GLOVE SURG UNDER POLY LF SZ7 (GLOVE) ×1 IMPLANT
GLOVE SURG UNDER POLY LF SZ8.5 (GLOVE) ×2 IMPLANT
GOWN STRL REUS W/ TWL LRG LVL3 (GOWN DISPOSABLE) ×1 IMPLANT
GOWN STRL REUS W/ TWL XL LVL3 (GOWN DISPOSABLE) ×2 IMPLANT
GOWN STRL REUS W/TWL 2XL LVL3 (GOWN DISPOSABLE) IMPLANT
GOWN STRL REUS W/TWL LRG LVL3 (GOWN DISPOSABLE) ×2
GOWN STRL REUS W/TWL XL LVL3 (GOWN DISPOSABLE) ×4
KIT BASIN OR (CUSTOM PROCEDURE TRAY) ×2 IMPLANT
KIT TURNOVER KIT B (KITS) ×2 IMPLANT
NDL SPNL 22GX3.5 QUINCKE BK (NEEDLE) ×1 IMPLANT
NEEDLE HYPO 22GX1.5 SAFETY (NEEDLE) ×2 IMPLANT
NEEDLE SPNL 22GX3.5 QUINCKE BK (NEEDLE) ×2 IMPLANT
NS IRRIG 1000ML POUR BTL (IV SOLUTION) ×2 IMPLANT
OIL CARTRIDGE MAESTRO DRILL (MISCELLANEOUS) ×2
PACK LAMINECTOMY NEURO (CUSTOM PROCEDURE TRAY) ×2 IMPLANT
SPONGE SURGIFOAM ABS GEL SZ50 (HEMOSTASIS) ×2 IMPLANT
STRIP CLOSURE SKIN 1/2X4 (GAUZE/BANDAGES/DRESSINGS) ×2 IMPLANT
SUT VIC AB 0 CT1 18XCR BRD8 (SUTURE) ×1 IMPLANT
SUT VIC AB 0 CT1 8-18 (SUTURE) ×2
SUT VIC AB 2-0 CT1 18 (SUTURE) ×2 IMPLANT
SUT VIC AB 4-0 PS2 18 (SUTURE) ×1 IMPLANT
SUT VICRYL 4-0 PS2 18IN ABS (SUTURE) ×2 IMPLANT
TOWEL GREEN STERILE (TOWEL DISPOSABLE) ×2 IMPLANT
TOWEL GREEN STERILE FF (TOWEL DISPOSABLE) ×2 IMPLANT
WATER STERILE IRR 1000ML POUR (IV SOLUTION) ×2 IMPLANT

## 2022-01-01 NOTE — H&P (Signed)
[name] is an 41 y.o. female.   Chief Complaint: Back and right leg pain HPI: 41 year old female previous history of right-sided L5-S1 laminotomy and microdiscectomy presents with recurrent back and right leg swelling radiculopathy.  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  Due to the size and location of fragment failed conservative treatment imaging findings I recommended repeat redo laminectomy microdiscectomy at L5-S1 on the right.  I extensively went over the risks and benefits of the operation with her as well as perioperative course expectations of outcome and alternatives to surgery and she understands and agrees to proceed forward.  Past Medical History:  Diagnosis Date   Abdominal lipoma 05/2015   Abdominal pain, RLQ 10/03/2017   Acute medial meniscus tear of right knee 06/09/2019   Anxiety    Arthritis    lower back   Depression 2019   DM type 2 with diabetic peripheral neuropathy (HCC)    Borderline   GERD (gastroesophageal reflux disease)    Headache    Migraines   History of MRSA infection ~ 2006   leg   History of shingles    Hx of hydronephrosis    following total hysterectomy surgery ; Right kidney ; see CT abdomen 10-02-17 epic; states " a plug had got damaged during the surgery so thats why they put the stent in"    Hypertension    under control with med., has been on med. x 6 mos.   Lumbosacral spondylolysis    see MRI 06-01-17 epic    Mood swing    Optic nerve swelling    seen by Dr Mora Bellman 09-07-17, did lumbar puncture 09-19-17; patient states " they never called me about results ut i have an appt with him in april"; patient also states  " my vision is still blurry and worse when i get the migraines, they think the migraines are causing it"   Psoriasis     Past Surgical History:  Procedure Laterality Date   CESAREAN SECTION  06/21/2008; 08/18/2010   CESAREAN SECTION N/A 07/10/2014   Procedure: REPEAT CESAREAN SECTION;   Surgeon: Daria Pastures, MD;  Location: [facility];  Service: Obstetrics;  Laterality: N/A;   CHOLECYSTECTOMY  07/10/2008   CYSTOSCOPY N/A 09/28/2017   Procedure: CYSTOSCOPY;  Surgeon: Bobbye Charleston, MD;  Location: [facility];  Service: Gynecology;  Laterality: N/A;   CYSTOSCOPY Right 01/18/2018   Procedure: CYSTOSCOPY, RETROGRADE / PYELOGRAM, URETEROSCOPY;  Surgeon: Ceasar Mons, MD;  Location: [facility];  Service: Urology;  Laterality: Right;   CYSTOSCOPY W/ URETERAL STENT PLACEMENT Right 10/03/2017   Procedure: CYSTOSCOPY WITH RETROGRADE PYELOGRAM/URETERAL STENT PLACEMENT;  Surgeon: Ceasar Mons, MD;  Location: [facility];  Service: Urology;  Laterality: Right;   CYSTOSCOPY W/ URETERAL STENT PLACEMENT Right 02/22/2018   Procedure: CYSTOSCOPY WITH URETERAL STENT PLACEMENT;  Surgeon: Ceasar Mons, MD;  Location: [facility];  Service: Urology;  Laterality: Right;   KNEE ARTHROSCOPY Right 09/17/2021   Procedure: ARTHROSCOPY KNEE WITH SYNOVECTOMY with partial menisectomy;  Surgeon: Hiram Gash, MD;  Location: [facility];  Service: Orthopedics;  Laterality: Right;   KNEE ARTHROSCOPY WITH MEDIAL MENISECTOMY Right 06/26/2019   Procedure: KNEE ARTHROSCOPY WITH PARTIAL MEDIAL MENISECTOMY, REMOVAL LOOSE BODY;  Surgeon: Elsie Saas, MD;  Location: [place];  Service: Orthopedics;  Laterality: Right;   LIPOMA EXCISION  2014   back   LIPOMA EXCISION N/A 05/21/2015   Procedure: EXCISION LIPOMAS  ON ABDOMEN;  Surgeon: Erroll Luna, MD;  Location: [place];  Service: General;  Laterality: N/A;   LUMBAR LAMINECTOMY/DECOMPRESSION MICRODISCECTOMY Right 01/23/2020   Procedure: Microdiscectomy right - Lumbar five-Sacral one;  Surgeon: Kary Kos, MD;  Location: [place];  Service: Neurosurgery;  Laterality: Right;   LUMBAR PUNCTURE  09/19/17   [facility], Dr. Curt Bears   OOPHORECTOMY Left 09/28/2017    Procedure: OOPHORECTOMY;  Surgeon: Bobbye Charleston, MD;  Location: [facility];  Service: Gynecology;  Laterality: Left;   ROBOTIC ASSISTED LAPAROSCOPIC LYSIS OF ADHESION N/A 09/28/2017   Procedure: ROBOTIC ASSISTED LAPAROSCOPIC LYSIS OF ADHESION;  Surgeon: Bobbye Charleston, MD;  Location: [facility];  Service: Gynecology;  Laterality: N/A;   ROBOTIC ASSISTED TOTAL HYSTERECTOMY WITH BILATERAL SALPINGO OOPHERECTOMY Bilateral 09/28/2017   Procedure: ROBOTIC ASSISTED TOTAL HYSTERECTOMY WITH BILATERAL SALPINGECTOMY;  Surgeon: Bobbye Charleston, MD;  Location: [facility];  Service: Gynecology;  Laterality: Bilateral;   ROBOTICALLY ASSISTED LAPAROSCOPIC URETERAL RE-IMPLANTATION Right 02/22/2018   Procedure: ROBOTICALLY ASSISTED LAPAROSCOPIC URETERAL RE-IMPLANTATION;  Surgeon: Ceasar Mons, MD;  Location: [facility];  Service: Urology;  Laterality: Right;  ONLY NEEDS 240 MIN TOTAL FOR ALL PROCEDURES   TUBAL LIGATION     WISDOM TOOTH EXTRACTION      Family History  Problem Relation Age of Onset   Drug abuse Mother    Heart disease Maternal Grandmother    Heart disease Maternal Grandfather    Social History:  reports that she has been smoking cigarettes. She started smoking about 7 years ago. She has a 8.00 pack-year smoking history. She has never used smokeless tobacco. She reports that she does not drink alcohol and does not use drugs.  Allergies:  Allergies  Allergen Reactions   Bee Venom Anaphylaxis   Penicillins Anaphylaxis and Other (See Comments)    Has patient had a PCN reaction causing immediate rash, facial/tongue/throat swelling, SOB or lightheadedness with hypotension: Yes Has patient had a PCN reaction causing severe rash involving mucus membranes or skin necrosis: No Has patient had a PCN reaction that required hospitalization: Yes Has patient had a PCN reaction occurring within the last 10 years: No If all of the above answers are "NO", then may proceed with Cephalosporin use.     Mushroom Extract Complex Nausea And Vomiting   Ciprofloxacin Rash   Powder Itching, Rash and Other (See Comments)    The powder from latex gloves    Medications Prior to Admission  Medication Sig Dispense Refill   celecoxib (CELEBREX) 100 MG capsule Take 100 mg by mouth 2 (two) times daily.     empagliflozin (JARDIANCE) 10 MG TABS tablet Take 1 tablet (10 mg total) by mouth daily. 90 tablet 3   FLUoxetine (PROZAC) 40 MG capsule Take 1 capsule (40 mg total) by mouth daily. 30 capsule 3   losartan-hydrochlorothiazide (HYZAAR) 100-12.5 MG tablet Take 1 tablet by mouth daily. 90 tablet 3   SUMAtriptan (IMITREX) 50 MG tablet Take 1 tablet (50 mg total) by mouth every 2 (two) hours as needed for migraine. May repeat in 2 hours if headache persists or recurs. 10 tablet 3   amLODipine (NORVASC) 10 MG tablet Take 1 tablet (10 mg total) by mouth at bedtime. (Patient not taking: Reported on 12/17/2021) 90 tablet 3   lidocaine (LIDODERM) 5 % Place 1 patch onto the skin daily. Remove & Discard patch within 12 hours or as directed by MD (Patient not taking: Reported on 12/17/2021) 30 patch 0    Results for  orders placed or performed during the hospital encounter of 01/01/22 (from the past 48 hour(s))  Glucose, capillary     Status: Abnormal   Collection Time: 01/01/22 11:26 AM  Result Value Ref Range   Glucose-Capillary 118 (H) 70 - 99 mg/dL    Comment: Glucose reference range applies only to samples taken after fasting for at least 8 hours.  Glucose, capillary     Status: Abnormal   Collection Time: 01/01/22  2:17 PM  Result Value Ref Range   Glucose-Capillary 103 (H) 70 - 99 mg/dL    Comment: Glucose reference range applies only to samples taken after fasting for at least 8 hours.   No results found.  Review of Systems  Musculoskeletal:  Positive for back pain.  Neurological:  Positive for weakness and numbness.   Blood pressure (!) 190/99, pulse 75, temperature 98.1 F (36.7 C), resp. rate 18,  height 5\' 6"  (1.676 m), weight 93 kg, last menstrual period 09/11/2017, SpO2 99 %. Physical Exam HENT:     Head: Normocephalic.     Right Ear: Tympanic membrane normal.     Nose: Nose normal.     Mouth/Throat:     Mouth: Mucous membranes are moist.  Cardiovascular:     Rate and Rhythm: Normal rate.  Pulmonary:     Effort: Pulmonary effort is normal.  Abdominal:     General: Abdomen is flat.  Musculoskeletal:        General: Normal range of motion.  Skin:    General: Skin is warm.  Neurological:     Mental Status: She is alert.     Comments: During this 5 out of 5 iliopsoas, quads, hamstrings, gastrocs, into tibialis, and EHL.     Assessment/Plan 42 year old presents for redo L5-S1 laminectomy microdiscectomy  Elaina Hoops, MD 01/01/2022, 2:33 PM

## 2022-01-01 NOTE — Op Note (Signed)
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 ?Postoperative diagnosis: Same ? ?Procedure: Redo lumbar microdiscectomy L5-S1 on the right with microscopic dissection of the right S1 nerve root microscopic discectomy ? ?Surgeon: Dominica Severin Hadyn Blanck ? ?Assistant: Nash Shearer ? ?Anesthesia: General ? ?EBL: Minimal ? ?HPI: 41 year old female previously undergone a right-sided microdiscectomy presents with recurrent right S1 radicular pain.  Work-up revealed a very large disc herniation with a large superior fragment and due to patient progression of clinical syndrome imaging findings and failed conservative treatment I recommended redo lumbar microdiscectomy.  I extensively went over the risks and benefits of the procedure with her as well as perioperative course expectations of outcome and alternatives of surgery and she understands and agrees to proceed forward. ? ?Operative procedure: Patient was brought into the OR was [facility] general anesthesia positioned prone the Wilson frame her back was prepped and draped in routine sterile fashion.  Her old incision was infiltrated with 10 cc lidocaine with epi and then a midline incision was made and Bovie electrocautery was used to dissect through the scar tissue and subperiosteal dissection was carried out and the residual lamina defect at L5-S1.  Intraoperative x-ray confirmed identification appropriate level.  I identified the plane underneath the laminotomy defect and extended the lamina superiorly under bit the medial facet complex identified the right S1 pedicle and the right S1 nerve root mobilized the thecal sac off of a large disc radiation still contained in scar and ligament.  I incised the disc and working underneath the scar removed several large fragments of disc at the level of disc base working superiorly I did not remove extensive mount of disc material superior the disc base however it did appear that a lot of  that disc might have resorbed resolved.  With some of it might have also been hematoma.  I extended laminotomy and was visualized up to the level of the medial aspect of the L5 pedicle and was able to palpate superiorly and there was no further fragments behind the L5 vertebral body extending all the way down the disc base and below.  At the end of discectomy there is no further stenosis either centrally or foraminally the wound was copiously irrigated with 6 hemostasis was maintained Gelfoam was applied top of the dura and the muscle fascia re-approximated in layers with interrupted Vicryl and a running 4 subcuticular Dermabond benzoin Steri-Strips and a sterile dressing was applied patient recovery room in stable condition.  At the end the case all needle count sponge counts were correct. ?

## 2022-01-01 NOTE — Anesthesia Procedure Notes (Signed)
Procedure Name: Intubation ?Date/Time: 01/01/2022 3:18 PM ?Performed by: Jenne Campus, CRNA ?Pre-anesthesia Checklist: Patient identified, Emergency Drugs available, Suction available and Patient being monitored ?Patient Re-evaluated:Patient Re-evaluated prior to induction ?Oxygen Delivery Method: Circle System Utilized ?Preoxygenation: Pre-oxygenation with 100% oxygen ?Induction Type: IV induction ?Ventilation: Mask ventilation without difficulty ?Laryngoscope Size: Sabra Heck and 2 ?Grade View: Grade I ?Tube type: Oral ?Tube size: 7.5 mm ?Number of attempts: 1 ?Airway Equipment and Method: Stylet and Oral airway ?Placement Confirmation: ETT inserted through vocal cords under direct vision, positive ETCO2 and breath sounds checked- equal and bilateral ?Secured at: 21 cm ?Tube secured with: Tape ?Dental Injury: Teeth and Oropharynx as per pre-operative assessment  ? ? ? ? ?

## 2022-01-01 NOTE — Anesthesia Preprocedure Evaluation (Addendum)
Anesthesia Evaluation  ?Patient identified by MRN, date of birth, ID band ?Patient awake ? ? ? ?Reviewed: ?Allergy & Precautions, NPO status , Patient's Chart, lab work & pertinent test results, reviewed documented beta blocker date and time  ? ?History of Anesthesia Complications ?Negative for: history of anesthetic complications ? ?Airway ?Mallampati: III ? ?TM Distance: >3 FB ?Neck ROM: Full ? ? ? Dental ? ?(+) Dental Advisory Given,  ?  ?Pulmonary ?Current Smoker and Patient abstained from smoking.,  ?  ?breath sounds clear to auscultation ? ? ? ? ? ? Cardiovascular ?hypertension, Pt. on medications and Pt. on home beta blockers ?(-) angina(-) Past MI and (-) CHF  ?Rhythm:Regular Rate:Normal ? ?ECG: NSR, rate 75 ?  ?Neuro/Psych ? Headaches, PSYCHIATRIC DISORDERS Anxiety Depression  Neuromuscular disease   ? GI/Hepatic ?Neg liver ROS, GERD  ,  ?Endo/Other  ?negative endocrine ROSdiabetes, Type 2 ? Renal/GU ?negative Renal ROS  ? ?  ?Musculoskeletal ? ?(+) Arthritis , Osteoarthritis,   ? Abdominal ?(+) + obese,   ?Peds ? Hematology ?negative hematology ROS ?(+) Lab Results ?     Component                Value               Date                 ?     WBC                      11.0 (H)            12/23/2021           ?     HGB                      14.2                12/23/2021           ?     HCT                      43.6                12/23/2021           ?     MCV                      88.8                12/23/2021           ?     PLT                      291                 12/23/2021           ?   ?Anesthesia Other Findings ?HNP ? Reproductive/Obstetrics ?S/p hysterectomy  ? ?  ? ? ? ? ? ? ? ? ? ? ? ? ? ?  ?  ? ? ? ? ? ? ? ?Anesthesia Physical ?Anesthesia Plan ? ?ASA: 2 ? ?Anesthesia Plan: General  ? ?Post-op Pain Management: Toradol IV (intra-op)* and Ofirmev IV (intra-op)*  ? ?Induction: Intravenous ? ?PONV Risk Score and Plan: 2 and Ondansetron and Dexamethasone ? ?Airway  Management Planned: Oral ETT ? ?Additional Equipment: None ? ?Intra-op Plan:  ? ?  Post-operative Plan: Extubation in OR ? ?Informed Consent: I have reviewed the patients History and Physical, chart, labs and discussed the procedure including the risks, benefits and alternatives for the proposed anesthesia with the patient or authorized representative who has indicated his/her understanding and acceptance.  ? ? ? ?Dental advisory given ? ?Plan Discussed with: CRNA and Anesthesiologist ? ?Anesthesia Plan Comments:   ? ? ? ? ? ? ?Anesthesia Quick Evaluation ? ?

## 2022-01-01 NOTE — Transfer of Care (Signed)
Immediate Anesthesia Transfer of Care Note ? ?Patient: [name] ? ?Procedure(s) Performed: Microdiscectomy Right Lumbar Five - Sacral One Redo (Right: Back) ? ?Patient Location: PACU ? ?Anesthesia Type:General ? ?Level of Consciousness: drowsy ? ?Airway & Oxygen Therapy: Patient Spontanous Breathing and Patient connected to face mask oxygen ? ?Post-op Assessment: Report given to RN and Post -op Vital signs reviewed and stable ? ?Post vital signs: Reviewed and stable ? ?Last Vitals:  ?Vitals Value Taken Time  ?BP 130/81 01/01/22 1723  ?Temp    ?Pulse 80 01/01/22 1729  ?Resp 22 01/01/22 1729  ?SpO2 98 % 01/01/22 1729  ?Vitals shown include unvalidated device data. ? ?Last Pain:  ?Vitals:  ? 01/01/22 1207  ?PainSc: 8   ?   ? ?Patients Stated Pain Goal: 4 (01/01/22 1207) ? ?Complications: No notable events documented. ?

## 2022-01-01 NOTE — Progress Notes (Signed)
Pharmacy Antibiotic Note ? ?[name] is a 41 y.o. female admitted on 01/01/2022 with redo lumbar microdiscectomy. Pharmacy has been consulted for vancomycin dosing for surgical prophylaxis. No drain in place.  ? ?Plan: ?Vancomycin 1000mg  x1  ? ?Height: 5\' 6"  (167.6 cm) ?Weight: 93 kg (205 lb) ?IBW/kg (Calculated) : 59.3 ? ?Temp (24hrs), Avg:97.7 ?F (36.5 ?C), Min:97.4 ?F (36.3 ?C), Max:98.1 ?F (36.7 ?C) ? ?No results for input(s): WBC, CREATININE, LATICACIDVEN, VANCOTROUGH, VANCOPEAK, VANCORANDOM, GENTTROUGH, GENTPEAK, GENTRANDOM, TOBRATROUGH, TOBRAPEAK, TOBRARND, AMIKACINPEAK, AMIKACINTROU, AMIKACIN in the last 168 hours.  ?Estimated Creatinine Clearance: 104.2 mL/min (by C-G formula based on SCr of 0.66 mg/dL).   ? ?Allergies  ?Allergen Reactions  ? Bee Venom Anaphylaxis  ? Penicillins Anaphylaxis and Other (See Comments)  ?  Has patient had a PCN reaction causing immediate rash, facial/tongue/throat swelling, SOB or lightheadedness with hypotension: Yes ?Has patient had a PCN reaction causing severe rash involving mucus membranes or skin necrosis: No ?Has patient had a PCN reaction that required hospitalization: Yes ?Has patient had a PCN reaction occurring within the last 10 years: No ?If all of the above answers are "NO", then may proceed with Cephalosporin use. ?  ? Mushroom Extract Complex Nausea And Vomiting  ? Ciprofloxacin Rash  ? Powder Itching, Rash and Other (See Comments)  ?  The powder from latex gloves  ? ? ?Antimicrobials this admission: ? ?Dose adjustments this admission: ? ?Microbiology results: ? ? ?Thank you for allowing pharmacy to be a part of this patient?s care. ? ?Cristela Felt, PharmD, BCPS ?Clinical Pharmacist ?01/01/2022 7:07 PM ? ? ?

## 2022-01-02 DIAGNOSIS — Z20822 Contact with and (suspected) exposure to covid-19: Secondary | ICD-10-CM | POA: Diagnosis not present

## 2022-01-02 DIAGNOSIS — I10 Essential (primary) hypertension: Secondary | ICD-10-CM | POA: Diagnosis not present

## 2022-01-02 DIAGNOSIS — M5117 Intervertebral disc disorders with radiculopathy, lumbosacral region: Secondary | ICD-10-CM | POA: Diagnosis not present

## 2022-01-02 DIAGNOSIS — F419 Anxiety disorder, unspecified: Secondary | ICD-10-CM | POA: Diagnosis not present

## 2022-01-02 DIAGNOSIS — F32A Depression, unspecified: Secondary | ICD-10-CM | POA: Diagnosis not present

## 2022-01-02 DIAGNOSIS — Z79899 Other long term (current) drug therapy: Secondary | ICD-10-CM | POA: Diagnosis not present

## 2022-01-02 DIAGNOSIS — F17210 Nicotine dependence, cigarettes, uncomplicated: Secondary | ICD-10-CM | POA: Diagnosis not present

## 2022-01-02 DIAGNOSIS — M1990 Unspecified osteoarthritis, unspecified site: Secondary | ICD-10-CM | POA: Diagnosis not present

## 2022-01-02 LAB — HEMOGLOBIN A1C
Hgb A1c MFr Bld: 6.3 % — ABNORMAL HIGH (ref 4.8–5.6)
Mean Plasma Glucose: 134.11 mg/dL

## 2022-01-02 LAB — GLUCOSE, CAPILLARY: Glucose-Capillary: 162 mg/dL — ABNORMAL HIGH (ref 70–99)

## 2022-01-02 MED ORDER — HYDROCODONE-ACETAMINOPHEN 5-325 MG PO TABS: 1.0000 | ORAL_TABLET | ORAL | 0 refills | Status: DC | PRN

## 2022-01-02 MED ORDER — CYCLOBENZAPRINE HCL 10 MG PO TABS: 10.0000 mg | ORAL_TABLET | Freq: Three times a day (TID) | ORAL | 2 refills | Status: AC | PRN

## 2022-01-02 NOTE — Anesthesia Postprocedure Evaluation (Signed)
Anesthesia Post Note ? ?Patient: [name] ? ?Procedure(s) Performed: Microdiscectomy Right Lumbar Five - Sacral One Redo (Right: Back) ? ?  ? ?Patient location during evaluation: PACU ?Anesthesia Type: General ?Level of consciousness: awake and alert ?Pain management: pain level controlled ?Vital Signs Assessment: post-procedure vital signs reviewed and stable ?Respiratory status: spontaneous breathing, nonlabored ventilation, respiratory function stable and patient connected to nasal cannula oxygen ?Cardiovascular status: blood pressure returned to baseline and stable ?Postop Assessment: no apparent nausea or vomiting ?Anesthetic complications: no ? ? ?No notable events documented. ? ?Last Vitals:  ?Vitals:  ? 01/02/22 0413 01/02/22 0733  ?BP: 134/78 111/62  ?Pulse: 67 60  ?Resp: 18 18  ?Temp: 36.6 ?C 36.7 ?C  ?SpO2: 97% 98%  ?  ?Last Pain:  ?Vitals:  ? 01/02/22 1003  ?TempSrc:   ?PainSc: 5   ? ? ?  ?  ?  ?  ?  ?  ? ?Mackensi Mahadeo ? ? ? ? ?

## 2022-01-02 NOTE — Progress Notes (Signed)
Patient is discharged from room 3C09 at this time. Alert and in stable condition. IV site d/c'd and instructions read to patient and spouse with understanding verbalized and all questions answered. Left unit via wheelchair with all belongings at side. .  

## 2022-01-02 NOTE — Evaluation (Signed)
Physical Therapy Evaluation ?Patient Details ?Name: [name] ?MRN: [id number] ?DOB: [birth year] ?Today's Date: 01/02/2022 ? ?History of Present Illness ? 41 y/o female admitted on 01/01/22 for redo lumbar laminectomy microdiscectomy L5-S1 on R. PMH: HTN, DM type 2 with peripheral neuropathy, hx of back surgery  ?Clinical Impression ? Patient admitted following above procedure. Patient functioning at supervision level for mobility with use of SPC and for stair negotiation. Educated patient on back precautions and progressive walking program, patient verbalized understanding. Patient states her young children will be able to assist with ADLs therefore declined education. No further skilled PT needs identified acutely. No PT follow up recommended at this time.    ?   ? ?Recommendations for follow up therapy are one component of a multi-disciplinary discharge planning process, led by the attending physician.  Recommendations may be updated based on patient status, additional functional criteria and insurance authorization. ? ?Follow Up Recommendations No PT follow up ? ?  ?Assistance Recommended at Discharge Frequent or constant Supervision/Assistance  ?Patient can return home with the following ?   ? ?  ?Equipment Recommendations None recommended by PT  ?Recommendations for Other Services ?    ?  ?Functional Status Assessment Patient has had a recent decline in their functional status and demonstrates the ability to make significant improvements in function in a reasonable and predictable amount of time.  ? ?  ?Precautions / Restrictions Precautions ?Precautions: Back ?Precaution Booklet Issued: Yes (comment) ?Restrictions ?Weight Bearing Restrictions: No  ? ?  ? ?Mobility ? Bed Mobility ?Overal bed mobility: Modified Independent ?  ?  ?  ?  ?  ?  ?  ?  ? ?Transfers ?Overall transfer level: Modified independent ?Equipment used: Straight cane ?  ?  ?  ?  ?  ?  ?  ?  ?  ? ?Ambulation/Gait ?Ambulation/Gait assistance:  Supervision ?Gait Distance (Feet): 50 Feet ?Assistive device: Straight cane ?Gait Pattern/deviations: Step-through pattern, Decreased stride length, Antalgic ?Gait velocity: decreased ?  ?  ?General Gait Details: supervision for safety. Patient declined further mobility ? ?Stairs ?Stairs: Yes ?Stairs assistance: Supervision ?Stair Management: With cane, Forwards, Step to pattern ?Number of Stairs: 2 ?  ? ?Wheelchair Mobility ?  ? ?Modified Rankin (Stroke Patients Only) ?  ? ?  ? ?Balance Overall balance assessment: Mild deficits observed, not formally tested (heavy reliance on cane) ?  ?  ?  ?  ?  ?  ?  ?  ?  ?  ?  ?  ?  ?  ?  ?  ?  ?  ?   ? ? ? ?Pertinent Vitals/Pain Pain Assessment ?Pain Assessment: Faces ?Faces Pain Scale: Hurts even more ?Pain Location: incision ?Pain Descriptors / Indicators: Grimacing, Guarding ?Pain Intervention(s): Monitored during session, Repositioned  ? ? ?Home Living Family/patient expects to be discharged to:: Private residence ?Living Arrangements: Spouse/significant other;Children ?Available Help at Discharge: Family ?Type of Home: House ?Home Access: Stairs to enter ?Entrance Stairs-Rails: None ?Entrance Stairs-Number of Steps: 2 ?  ?Home Layout: One level ?Home Equipment: Rollator (4 wheels);Cane - single point ?   ?  ?Prior Function Prior Level of Function : Independent/Modified Independent ?  ?  ?  ?  ?  ?  ?Mobility Comments: using cane for short distances and rollator for long distances. ?ADLs Comments: patient states children can assist with dressing ?  ? ? ?Hand Dominance  ?   ? ?  ?Extremity/Trunk Assessment  ? Upper Extremity Assessment ?Upper Extremity Assessment:  Generalized weakness ?  ? ?Lower Extremity Assessment ?Lower Extremity Assessment: Generalized weakness ?  ? ?Cervical / Trunk Assessment ?Cervical / Trunk Assessment: Back Surgery  ?Communication  ? Communication: No difficulties  ?Cognition Arousal/Alertness: Awake/alert ?Behavior During Therapy: Flat  affect ?Overall Cognitive Status: Within Functional Limits for tasks assessed ?  ?  ?  ?  ?  ?  ?  ?  ?  ?  ?  ?  ?  ?  ?  ?  ?  ?  ?  ? ?  ?General Comments   ? ?  ?Exercises    ? ?Assessment/Plan  ?  ?PT Assessment Patient does not need any further PT services  ?PT Problem List   ? ?   ?  ?PT Treatment Interventions     ? ?PT Goals (Current goals can be found in the Care Plan section)  ?Acute Rehab PT Goals ?Patient Stated Goal: to go home ?PT Goal Formulation: All assessment and education complete, DC therapy ? ?  ?Frequency   ?  ? ? ?Co-evaluation   ?  ?  ?  ?  ? ? ?  ?AM-PAC PT "6 Clicks" Mobility  ?Outcome Measure Help needed turning from your back to your side while in a flat bed without using bedrails?: None ?Help needed moving from lying on your back to sitting on the side of a flat bed without using bedrails?: None ?Help needed moving to and from a bed to a chair (including a wheelchair)?: None ?Help needed standing up from a chair using your arms (e.g., wheelchair or bedside chair)?: None ?Help needed to walk in hospital room?: A Little ?Help needed climbing 3-5 steps with a railing? : A Little ?6 Click Score: 22 ? ?  ?End of Session   ?Activity Tolerance: Patient limited by pain ?Patient left: in bed;with call bell/phone within reach ?Nurse Communication: Mobility status ?PT Visit Diagnosis: Unsteadiness on feet (R26.81);Muscle weakness (generalized) (M62.81) ?  ? ?Time: 0350-0938 ?PT Time Calculation (min) (ACUTE ONLY): 11 min ? ? ?Charges:   PT Evaluation ?$PT Eval Low Complexity: 1 Low ?  ?  ?   ? ? ?Refujio Haymer A. Gilford Rile, PT, DPT ?Acute Rehabilitation Services ?Pager [phone] ?Office [phone] ? ? ?Deshaun Weisinger A Lashaunda Schild ?01/02/2022, 9:30 AM ? ?

## 2022-01-02 NOTE — Discharge Summary (Signed)
Physician Discharge Summary  ?Patient ID: ?[name] ?MRN: [id number] ?DOB/AGE: May 03, 1981 40 y.o. ? ?Admit date: 01/01/2022 ?Discharge date: 01/02/2022 ? ?Admission Diagnoses: Herniated nucleus pulposus L5-S1 with lumbar radiculopathy, recurrent ? ?Discharge Diagnoses: Herniated nucleus pulposus L5-S1 with lumbar radiculopathy, recurrent ?Principal Problem: ?  HNP (herniated nucleus pulposus), lumbar ? ? ?Discharged Condition: good ? ?Hospital Course: Patient was admitted to undergo surgical decompression which she tolerated well. ? ?Consults: None ? ?Significant Diagnostic Studies: None ? ?Treatments: surgery: See op note ? ?Discharge Exam: ?Blood pressure 111/62, pulse 60, temperature 98 ?F (36.7 ?C), temperature source Oral, resp. rate 18, height '5\' 6"'$  (1.676 m), weight 93 kg, last menstrual period 09/11/2017, SpO2 98 %. ?Incision is clean and dry motor function is intact ? ?Disposition: Discharge disposition: 01-Home or Self Care ? ? ? ? ? ? ?Discharge Instructions   ? ? Call MD for:  redness, tenderness, or signs of infection (pain, swelling, redness, odor or green/yellow discharge around incision site)   Complete by: As directed ?  ? Call MD for:  severe uncontrolled pain   Complete by: As directed ?  ? Call MD for:  temperature >100.4   Complete by: As directed ?  ? Diet - low sodium heart healthy   Complete by: As directed ?  ? Discharge wound care:   Complete by: As directed ?  ? Per Dr. Saintclair Halsted  ? Increase activity slowly   Complete by: As directed ?  ? ?  ? ?Allergies as of 01/02/2022   ? ?   Reactions  ? Bee Venom Anaphylaxis  ? Penicillins Anaphylaxis, Other (See Comments)  ? Has patient had a PCN reaction causing immediate rash, facial/tongue/throat swelling, SOB or lightheadedness with hypotension: Yes ?Has patient had a PCN reaction causing severe rash involving mucus membranes or skin necrosis: No ?Has patient had a PCN reaction that required hospitalization: Yes ?Has patient had a PCN reaction  occurring within the last 10 years: No ?If all of the above answers are "NO", then may proceed with Cephalosporin use.  ? Mushroom Extract Complex Nausea And Vomiting  ? Ciprofloxacin Rash  ? Powder Itching, Rash, Other (See Comments)  ? The powder from latex gloves  ? ?  ? ?  ?Medication List  ?  ? ?TAKE these medications   ? ?amLODipine 10 MG tablet ?Commonly known as: NORVASC ?Take 1 tablet (10 mg total) by mouth at bedtime. ?  ?celecoxib 100 MG capsule ?Commonly known as: CELEBREX ?Take 100 mg by mouth 2 (two) times daily. ?  ?cyclobenzaprine 10 MG tablet ?Commonly known as: FLEXERIL ?Take 1 tablet (10 mg total) by mouth 3 (three) times daily as needed for muscle spasms. ?  ?empagliflozin 10 MG Tabs tablet ?Commonly known as: JARDIANCE ?Take 1 tablet (10 mg total) by mouth daily. ?  ?FLUoxetine 40 MG capsule ?Commonly known as: PROZAC ?Take 1 capsule (40 mg total) by mouth daily. ?  ?HYDROcodone-acetaminophen 5-325 MG tablet ?Commonly known as: NORCO/VICODIN ?Take 1-2 tablets by mouth every 4 (four) hours as needed for severe pain ((score 7 to 10)). ?  ?lidocaine 5 % ?Commonly known as: Lidoderm ?Place 1 patch onto the skin daily. Remove & Discard patch within 12 hours or as directed by MD ?  ?losartan-hydrochlorothiazide 100-12.5 MG tablet ?Commonly known as: HYZAAR ?Take 1 tablet by mouth daily. ?  ?SUMAtriptan 50 MG tablet ?Commonly known as: IMITREX ?Take 1 tablet (50 mg total) by mouth every 2 (two) hours as needed for migraine. May  repeat in 2 hours if headache persists or recurs. ?  ? ?  ? ?  ?  ? ? ?  ?Discharge Care Instructions  ?(From admission, onward)  ?  ? ? ?  ? ?  Start     Ordered  ? 01/02/22 0000  Discharge wound care:       ?Comments: Per Dr. Saintclair Halsted  ? 01/02/22 0829  ? ?  ?  ? ?  ? ? ? ?Signed: ?Earleen Newport ?01/02/2022, 8:29 AM ? ? ?

## 2022-01-02 NOTE — Progress Notes (Signed)
OT Cancellation Note ? ?Patient Details ?Name: [name] ?MRN: [id number] ?DOB: [birth year] ? ? ?Cancelled Treatment:    Reason Eval/Treat Not Completed: Patient declined, no reason specified.  No needs identified. ? ?[name] ?01/02/2022, 9:16 AM ?

## 2022-01-03 ENCOUNTER — Encounter (HOSPITAL_COMMUNITY): Payer: Self-pay | Admitting: Neurosurgery

## 2022-01-04 MED FILL — Thrombin For Soln 5000 Unit: CUTANEOUS | Qty: 2 | Status: AC

## 2022-01-08 ENCOUNTER — Other Ambulatory Visit: Payer: Self-pay

## 2022-01-08 NOTE — Telephone Encounter (Signed)
Received fax from [facility]: ? ?Alternative Requested ?Fluoxetine HCL 401 MG Caps ?Take 1 Cap (40 mg total) ?Prescriber not on file with Medicaid ? ?Ottis Stain, CMA  ?

## 2022-01-10 MED ORDER — FLUOXETINE HCL 40 MG PO CAPS: 40.0000 mg | ORAL_CAPSULE | Freq: Every day | ORAL | 3 refills | Status: AC

## 2022-01-30 DIAGNOSIS — Z419 Encounter for procedure for purposes other than remedying health state, unspecified: Secondary | ICD-10-CM | POA: Diagnosis not present

## 2022-02-16 ENCOUNTER — Other Ambulatory Visit: Payer: Self-pay | Admitting: Neurosurgery

## 2022-02-16 DIAGNOSIS — M5126 Other intervertebral disc displacement, lumbar region: Secondary | ICD-10-CM

## 2022-03-01 DIAGNOSIS — Z419 Encounter for procedure for purposes other than remedying health state, unspecified: Secondary | ICD-10-CM | POA: Diagnosis not present

## 2022-03-09 ENCOUNTER — Ambulatory Visit
Admission: RE | Admit: 2022-03-09 | Discharge: 2022-03-09 | Disposition: A | Discharge status: Home / Self Care | Payer: Medicaid Other | Source: Ambulatory Visit | Attending: Neurosurgery | Admitting: Neurosurgery

## 2022-03-09 DIAGNOSIS — M545 Low back pain, unspecified: Secondary | ICD-10-CM | POA: Diagnosis not present

## 2022-03-09 DIAGNOSIS — M5126 Other intervertebral disc displacement, lumbar region: Secondary | ICD-10-CM

## 2022-03-09 MED ORDER — GADOBENATE DIMEGLUMINE 529 MG/ML IV SOLN
19.0000 mL | Freq: Once | INTRAVENOUS | Status: AC | PRN
  Administered 2022-03-09: 19 mL via INTRAVENOUS

## 2022-03-22 DIAGNOSIS — M5416 Radiculopathy, lumbar region: Secondary | ICD-10-CM | POA: Diagnosis not present

## 2022-04-01 DIAGNOSIS — Z419 Encounter for procedure for purposes other than remedying health state, unspecified: Secondary | ICD-10-CM | POA: Diagnosis not present

## 2022-04-06 ENCOUNTER — Encounter: Payer: Self-pay | Admitting: *Deleted

## 2022-04-29 DIAGNOSIS — M5126 Other intervertebral disc displacement, lumbar region: Secondary | ICD-10-CM | POA: Diagnosis not present

## 2022-04-29 DIAGNOSIS — M542 Cervicalgia: Secondary | ICD-10-CM | POA: Diagnosis not present

## 2022-05-01 DIAGNOSIS — Z419 Encounter for procedure for purposes other than remedying health state, unspecified: Secondary | ICD-10-CM | POA: Diagnosis not present

## 2022-05-05 ENCOUNTER — Other Ambulatory Visit: Payer: Self-pay | Admitting: Neurosurgery

## 2022-05-28 ENCOUNTER — Other Ambulatory Visit (HOSPITAL_COMMUNITY): Payer: Medicaid Other

## 2022-06-01 DIAGNOSIS — Z419 Encounter for procedure for purposes other than remedying health state, unspecified: Secondary | ICD-10-CM | POA: Diagnosis not present

## 2022-06-02 NOTE — Pre-Procedure Instructions (Signed)
Surgical Instructions    Your procedure is scheduled on June 04, 2022.  Report to [facility] Main Entrance "A" at 5:30 A.M., then check in with the Admitting office.  Call this number if you have problems the morning of surgery:  [phone]   If you have any questions prior to your surgery date call [phone]: Open Monday-Friday 8am-4pm    Remember:  Do not eat or drink after midnight the night before your surgery    Take these medicines the morning of surgery with A SIP OF WATER:  FLUoxetine (PROZAC)  cyclobenzaprine (FLEXERIL) - take as needed  SUMAtriptan (IMITREX) - take as needed  As of today, STOP taking any Aspirin (unless otherwise instructed by your surgeon) Aleve, Naproxen, Ibuprofen, Motrin, Advil, Goody's, BC's, all herbal medications, fish oil, and all vitamins.  WHAT DO I DO ABOUT MY DIABETES MEDICATION?   Stop taking empagliflozin (JARDIANCE) THREE DAYS prior to surgery.   HOW TO MANAGE YOUR DIABETES BEFORE AND AFTER SURGERY  Why is it important to control my blood sugar before and after surgery? Improving blood sugar levels before and after surgery helps healing and can limit problems. A way of improving blood sugar control is eating a healthy diet by:  Eating less sugar and carbohydrates  Increasing activity/exercise  Talking with your doctor about reaching your blood sugar goals High blood sugars (greater than 180 mg/dL) can raise your risk of infections and slow your recovery, so you will need to focus on controlling your diabetes during the weeks before surgery. Make sure that the doctor who takes care of your diabetes knows about your planned surgery including the date and location.  How do I manage my blood sugar before surgery? Check your blood sugar at least 4 times a day, starting 2 days before surgery, to make sure that the level is not too high or low.  Check your blood sugar the morning of your surgery when you wake up and every 2 hours  until you get to the Short Stay unit.  If your blood sugar is less than 70 mg/dL, you will need to treat for low blood sugar: Do not take insulin. Treat a low blood sugar (less than 70 mg/dL) with  cup of clear juice (cranberry or apple), 4 glucose tablets, OR glucose gel. Recheck blood sugar in 15 minutes after treatment (to make sure it is greater than 70 mg/dL). If your blood sugar is not greater than 70 mg/dL on recheck, call [phone] for further instructions. Report your blood sugar to the short stay nurse when you get to Short Stay.  If you are admitted to the hospital after surgery: Your blood sugar will be checked by the staff and you will probably be given insulin after surgery (instead of oral diabetes medicines) to make sure you have good blood sugar levels. The goal for blood sugar control after surgery is 80-180 mg/dL.                      Do NOT Smoke (Tobacco/Vaping) for 24 hours prior to your procedure.  If you use a CPAP at night, you may bring your mask/headgear for your overnight stay.   Contacts, glasses, piercing's, hearing aid's, dentures or partials may not be worn into surgery, please bring cases for these belongings.    For patients admitted to the hospital, discharge time will be determined by your treatment team.   Patients discharged the day of surgery will not be allowed to  drive home, and someone needs to stay with them for 24 hours.  SURGICAL WAITING ROOM VISITATION Patients having surgery or a procedure may have no more than 2 support people in the waiting area - these visitors may rotate.   Children under the age of 17 must have an adult with them who is not the patient. If the patient needs to stay at the hospital during part of their recovery, the visitor guidelines for inpatient rooms apply. Pre-op nurse will coordinate an appropriate time for 1 support person to accompany patient in pre-op.  This support person may not rotate.   Please refer to  the [facility] website for the visitor guidelines for Inpatients (after your surgery is over and you are in a regular room).    Special instructions:   [facility]- Preparing For Surgery  Before surgery, you can play an important role. Because skin is not sterile, your skin needs to be as free of germs as possible. You can reduce the number of germs on your skin by washing with CHG (chlorahexidine gluconate) Soap before surgery.  CHG is an antiseptic cleaner which kills germs and bonds with the skin to continue killing germs even after washing.    Oral Hygiene is also important to reduce your risk of infection.  Remember - BRUSH YOUR TEETH THE MORNING OF SURGERY WITH YOUR REGULAR TOOTHPASTE  Please do not use if you have an allergy to CHG or antibacterial soaps. If your skin becomes reddened/irritated stop using the CHG.  Do not shave (including legs and underarms) for at least 48 hours prior to first CHG shower. It is OK to shave your face.  Please follow these instructions carefully.   Shower the NIGHT BEFORE SURGERY and the MORNING OF SURGERY  If you chose to wash your hair, wash your hair first as usual with your normal shampoo.  After you shampoo, rinse your hair and body thoroughly to remove the shampoo.  Use CHG Soap as you would any other liquid soap. You can apply CHG directly to the skin and wash gently with a scrungie or a clean washcloth.   Apply the CHG Soap to your body ONLY FROM THE NECK DOWN.  Do not use on open wounds or open sores. Avoid contact with your eyes, ears, mouth and genitals (private parts). Wash Face and genitals (private parts)  with your normal soap.   Wash thoroughly, paying special attention to the area where your surgery will be performed.  Thoroughly rinse your body with warm water from the neck down.  DO NOT shower/wash with your normal soap after using and rinsing off the CHG Soap.  Pat yourself dry with a CLEAN TOWEL.  Wear CLEAN PAJAMAS to bed  the night before surgery  Place CLEAN SHEETS on your bed the night before your surgery  DO NOT SLEEP WITH PETS.   Day of Surgery: Take a shower with CHG soap. Do not wear jewelry or makeup Do not wear lotions, powders, perfumes/colognes, or deodorant. Do not shave 48 hours prior to surgery.   Do not bring valuables to the hospital.  [facility] is not responsible for any belongings or valuables. Do not wear nail polish, gel polish, artificial nails, or any other type of covering on natural nails (fingers and toes) If you have artificial nails or gel coating that need to be removed by a nail salon, please have this removed prior to surgery. Artificial nails or gel coating may interfere with anesthesia's ability to adequately  monitor your vital signs.  Wear Clean/Comfortable clothing the morning of surgery Remember to brush your teeth WITH YOUR REGULAR TOOTHPASTE.   Please read over the following fact sheets that you were given.    If you received a COVID test during your pre-op visit  it is requested that you wear a mask when out in public, stay away from anyone that may not be feeling well and notify your surgeon if you develop symptoms. If you have been in contact with anyone that has tested positive in the last 10 days please notify you surgeon.

## 2022-06-03 ENCOUNTER — Other Ambulatory Visit: Payer: Self-pay

## 2022-06-03 ENCOUNTER — Encounter (HOSPITAL_COMMUNITY): Payer: Self-pay

## 2022-06-03 ENCOUNTER — Encounter (HOSPITAL_COMMUNITY)
Admission: RE | Admit: 2022-06-03 | Discharge: 2022-06-03 | Disposition: A | Discharge status: Home / Self Care | Payer: Medicaid Other | Source: Ambulatory Visit | Attending: Neurosurgery | Admitting: Neurosurgery

## 2022-06-03 VITALS — BP 143/88 | HR 83 | Temp 98.2°F | Resp 17 | Ht 66.0 in | Wt 201.2 lb

## 2022-06-03 DIAGNOSIS — Z01812 Encounter for preprocedural laboratory examination: Secondary | ICD-10-CM | POA: Insufficient documentation

## 2022-06-03 DIAGNOSIS — I10 Essential (primary) hypertension: Secondary | ICD-10-CM | POA: Diagnosis not present

## 2022-06-03 DIAGNOSIS — Z01818 Encounter for other preprocedural examination: Secondary | ICD-10-CM

## 2022-06-03 DIAGNOSIS — E119 Type 2 diabetes mellitus without complications: Secondary | ICD-10-CM | POA: Diagnosis not present

## 2022-06-03 LAB — CBC
HCT: 39.5 % (ref 36.0–46.0)
Hemoglobin: 13 g/dL (ref 12.0–15.0)
MCH: 28.4 pg (ref 26.0–34.0)
MCHC: 32.9 g/dL (ref 30.0–36.0)
MCV: 86.2 fL (ref 80.0–100.0)
Platelets: 269 10*3/uL (ref 150–400)
RBC: 4.58 MIL/uL (ref 3.87–5.11)
RDW: 15.4 % (ref 11.5–15.5)
WBC: 10 10*3/uL (ref 4.0–10.5)
nRBC: 0 % (ref 0.0–0.2)

## 2022-06-03 LAB — BASIC METABOLIC PANEL
Anion gap: 9 (ref 5–15)
BUN: 8 mg/dL (ref 6–20)
CO2: 24 mmol/L (ref 22–32)
Calcium: 9 mg/dL (ref 8.9–10.3)
Chloride: 105 mmol/L (ref 98–111)
Creatinine, Ser: 0.52 mg/dL (ref 0.44–1.00)
GFR, Estimated: >60 mL/min (ref >60)
Glucose, Bld: 172 mg/dL — ABNORMAL HIGH (ref 70–99)
Potassium: 3.5 mmol/L (ref 3.5–5.1)
Sodium: 138 mmol/L (ref 135–145)

## 2022-06-03 LAB — HEMOGLOBIN A1C
Hgb A1c MFr Bld: 7.5 % — ABNORMAL HIGH (ref 4.8–5.6)
Mean Plasma Glucose: 168.55 mg/dL

## 2022-06-03 LAB — SURGICAL PCR SCREEN
MRSA, PCR: NEGATIVE
Staphylococcus aureus: POSITIVE — AB

## 2022-06-03 LAB — GLUCOSE, CAPILLARY: Glucose-Capillary: 189 mg/dL — ABNORMAL HIGH (ref 70–99)

## 2022-06-03 NOTE — Progress Notes (Signed)
PCP - Dr. Orvis Brill Cardiologist - denies  PPM/ICD - denies   Chest x-ray - 01/21/18 EKG - 09/14/21 Stress Test - denies ECHO - denies Cardiac Cath - denies  Sleep Study - denies  DM- Type 2 Pt states she only checks her CBG "once in a while" at home and does not know what her typical fasting glucose levels are  ASA/Blood Thinner Instructions: n/a   ERAS Protcol - no, NPO   COVID TEST- n/a   Anesthesia review: no  Patient denies shortness of breath, fever, cough and chest pain at PAT appointment   All instructions explained to the patient, with a verbal understanding of the material. Patient agrees to go over the instructions while at home for a better understanding. The opportunity to ask questions was provided.

## 2022-06-03 NOTE — Anesthesia Preprocedure Evaluation (Addendum)
Anesthesia Evaluation  Patient identified by MRN, date of birth, ID band Patient awake    Reviewed: Allergy & Precautions, NPO status , Patient's Chart, lab work & pertinent test results  History of Anesthesia Complications Negative for: history of anesthetic complications  Airway Mallampati: III  TM Distance: >3 FB Neck ROM: Full    Dental  (+) Dental Advisory Given   Pulmonary Current Smoker and Patient abstained from smoking.,    breath sounds clear to auscultation       Cardiovascular hypertension, Pt. on medications (-) angina Rhythm:Regular Rate:Normal     Neuro/Psych  Headaches, Anxiety Depression    GI/Hepatic Neg liver ROS, GERD  Controlled,  Endo/Other  diabetes (glu 157), Oral Hypoglycemic AgentsMorbid obesity  Renal/GU negative Renal ROS     Musculoskeletal  (+) Arthritis ,   Abdominal (+) + obese,   Peds  Hematology negative hematology ROS (+)   Anesthesia Other Findings   Reproductive/Obstetrics                            Anesthesia Physical Anesthesia Plan  ASA: 2  Anesthesia Plan: General   Post-op Pain Management: Tylenol PO (pre-op)*   Induction: Intravenous  PONV Risk Score and Plan: 2 and Ondansetron and Dexamethasone  Airway Management Planned: Oral ETT and Video Laryngoscope Planned  Additional Equipment: None  Intra-op Plan:   Post-operative Plan: Extubation in OR  Informed Consent: I have reviewed the patients History and Physical, chart, labs and discussed the procedure including the risks, benefits and alternatives for the proposed anesthesia with the patient or authorized representative who has indicated his/her understanding and acceptance.     Dental advisory given  Plan Discussed with: CRNA and Surgeon  Anesthesia Plan Comments:        Anesthesia Quick Evaluation

## 2022-06-04 ENCOUNTER — Ambulatory Visit (HOSPITAL_COMMUNITY)
Admission: RE | Admit: 2022-06-04 | Discharge: 2022-06-05 | Disposition: A | Discharge status: Home / Self Care | Payer: Medicaid Other | Attending: Neurosurgery | Admitting: Neurosurgery

## 2022-06-04 ENCOUNTER — Ambulatory Visit (HOSPITAL_COMMUNITY): Payer: Medicaid Other | Admitting: Certified Registered"

## 2022-06-04 ENCOUNTER — Ambulatory Visit (HOSPITAL_COMMUNITY): Payer: Medicaid Other

## 2022-06-04 ENCOUNTER — Encounter (HOSPITAL_COMMUNITY): Payer: Self-pay | Admitting: Neurosurgery

## 2022-06-04 ENCOUNTER — Encounter (HOSPITAL_COMMUNITY)
Admission: RE | Disposition: A | Discharge status: Home / Self Care | Payer: Self-pay | Source: Home / Self Care | Attending: Neurosurgery

## 2022-06-04 ENCOUNTER — Ambulatory Visit (HOSPITAL_BASED_OUTPATIENT_CLINIC_OR_DEPARTMENT_OTHER): Payer: Medicaid Other | Admitting: Certified Registered"

## 2022-06-04 ENCOUNTER — Other Ambulatory Visit: Payer: Self-pay

## 2022-06-04 DIAGNOSIS — M5127 Other intervertebral disc displacement, lumbosacral region: Secondary | ICD-10-CM | POA: Diagnosis not present

## 2022-06-04 DIAGNOSIS — E119 Type 2 diabetes mellitus without complications: Secondary | ICD-10-CM | POA: Insufficient documentation

## 2022-06-04 DIAGNOSIS — I10 Essential (primary) hypertension: Secondary | ICD-10-CM | POA: Diagnosis not present

## 2022-06-04 DIAGNOSIS — Z7984 Long term (current) use of oral hypoglycemic drugs: Secondary | ICD-10-CM | POA: Insufficient documentation

## 2022-06-04 DIAGNOSIS — K219 Gastro-esophageal reflux disease without esophagitis: Secondary | ICD-10-CM | POA: Insufficient documentation

## 2022-06-04 DIAGNOSIS — F17210 Nicotine dependence, cigarettes, uncomplicated: Secondary | ICD-10-CM | POA: Insufficient documentation

## 2022-06-04 DIAGNOSIS — Z981 Arthrodesis status: Secondary | ICD-10-CM | POA: Diagnosis not present

## 2022-06-04 DIAGNOSIS — M5126 Other intervertebral disc displacement, lumbar region: Secondary | ICD-10-CM | POA: Diagnosis present

## 2022-06-04 DIAGNOSIS — Z9889 Other specified postprocedural states: Secondary | ICD-10-CM | POA: Diagnosis not present

## 2022-06-04 HISTORY — PX: LUMBAR LAMINECTOMY/DECOMPRESSION MICRODISCECTOMY: SHX5026

## 2022-06-04 LAB — GLUCOSE, CAPILLARY
Glucose-Capillary: 157 mg/dL — ABNORMAL HIGH (ref 70–99)
Glucose-Capillary: 147 mg/dL — ABNORMAL HIGH (ref 70–99)
Glucose-Capillary: 155 mg/dL — ABNORMAL HIGH (ref 70–99)
Glucose-Capillary: 155 mg/dL — ABNORMAL HIGH (ref 70–99)
Glucose-Capillary: 177 mg/dL — ABNORMAL HIGH (ref 70–99)
Glucose-Capillary: 263 mg/dL — ABNORMAL HIGH (ref 70–99)

## 2022-06-04 SURGERY — LUMBAR LAMINECTOMY/DECOMPRESSION MICRODISCECTOMY 1 LEVEL
Anesthesia: General | Site: Back | Laterality: Right

## 2022-06-04 MED ORDER — THROMBIN 5000 UNITS EX SOLR
CUTANEOUS | Status: AC
  Filled 2022-06-04: qty 10000

## 2022-06-04 MED ORDER — PHENOL 1.4 % MT LIQD: 1.0000 | OROMUCOSAL | Status: DC | PRN

## 2022-06-04 MED ORDER — VANCOMYCIN HCL IN DEXTROSE 1-5 GM/200ML-% IV SOLN
INTRAVENOUS | Status: AC
  Administered 2022-06-04: 1000 mg via INTRAVENOUS
  Filled 2022-06-04: qty 200

## 2022-06-04 MED ORDER — PROPOFOL 10 MG/ML IV BOLUS
INTRAVENOUS | Status: AC
  Filled 2022-06-04: qty 20

## 2022-06-04 MED ORDER — MENTHOL 3 MG MT LOZG: 1.0000 | LOZENGE | OROMUCOSAL | Status: DC | PRN

## 2022-06-04 MED ORDER — PANTOPRAZOLE SODIUM 40 MG IV SOLR: 40.0000 mg | Freq: Every day | INTRAVENOUS | Status: DC

## 2022-06-04 MED ORDER — AMLODIPINE BESYLATE 5 MG PO TABS: 10.0000 mg | ORAL_TABLET | Freq: Every day | ORAL | Status: DC

## 2022-06-04 MED ORDER — MIDAZOLAM HCL 2 MG/2ML IJ SOLN
INTRAMUSCULAR | Status: DC | PRN
  Administered 2022-06-04: 2 mg via INTRAVENOUS

## 2022-06-04 MED ORDER — SUMATRIPTAN SUCCINATE 50 MG PO TABS: 50.0000 mg | ORAL_TABLET | ORAL | Status: DC | PRN

## 2022-06-04 MED ORDER — PHENYLEPHRINE HCL-NACL 20-0.9 MG/250ML-% IV SOLN
INTRAVENOUS | Status: DC | PRN
  Administered 2022-06-04: 25 ug/min via INTRAVENOUS

## 2022-06-04 MED ORDER — SODIUM CHLORIDE 0.9% FLUSH
3.0000 mL | Freq: Two times a day (BID) | INTRAVENOUS | Status: DC
  Administered 2022-06-04 (×2): 3 mL via INTRAVENOUS

## 2022-06-04 MED ORDER — SODIUM CHLORIDE 0.9 % IV SOLN
250.0000 mL | INTRAVENOUS | Status: DC
  Administered 2022-06-04: 250 mL via INTRAVENOUS

## 2022-06-04 MED ORDER — CYCLOBENZAPRINE HCL 10 MG PO TABS: 10.0000 mg | ORAL_TABLET | Freq: Three times a day (TID) | ORAL | Status: DC | PRN

## 2022-06-04 MED ORDER — HYDROMORPHONE HCL 1 MG/ML IJ SOLN: 0.5000 mg | INTRAMUSCULAR | Status: DC | PRN

## 2022-06-04 MED ORDER — ACETAMINOPHEN 325 MG PO TABS: 650.0000 mg | ORAL_TABLET | ORAL | Status: DC | PRN

## 2022-06-04 MED ORDER — PANTOPRAZOLE SODIUM 40 MG PO TBEC
40.0000 mg | DELAYED_RELEASE_TABLET | Freq: Every day | ORAL | Status: DC
  Administered 2022-06-04 – 2022-06-05 (×2): 40 mg via ORAL
  Filled 2022-06-04 (×2): qty 1

## 2022-06-04 MED ORDER — KETOROLAC TROMETHAMINE 30 MG/ML IJ SOLN
INTRAMUSCULAR | Status: AC
  Filled 2022-06-04: qty 1

## 2022-06-04 MED ORDER — CHLORHEXIDINE GLUCONATE 0.12 % MT SOLN: 15.0000 mL | Freq: Once | OROMUCOSAL | Status: AC

## 2022-06-04 MED ORDER — ACETAMINOPHEN 500 MG PO TABS
ORAL_TABLET | ORAL | Status: AC
  Administered 2022-06-04: 1000 mg via ORAL
  Filled 2022-06-04: qty 2

## 2022-06-04 MED ORDER — HYDROMORPHONE HCL 1 MG/ML IJ SOLN
INTRAMUSCULAR | Status: AC
  Filled 2022-06-04: qty 1

## 2022-06-04 MED ORDER — ACETAMINOPHEN 500 MG PO TABS: 1000.0000 mg | ORAL_TABLET | Freq: Once | ORAL | Status: AC

## 2022-06-04 MED ORDER — HYDROXYZINE HCL 50 MG/ML IM SOLN
50.0000 mg | Freq: Four times a day (QID) | INTRAMUSCULAR | Status: DC | PRN
  Administered 2022-06-04: 50 mg via INTRAMUSCULAR
  Filled 2022-06-04: qty 1

## 2022-06-04 MED ORDER — LIDOCAINE-EPINEPHRINE 1 %-1:100000 IJ SOLN
INTRAMUSCULAR | Status: AC
  Filled 2022-06-04: qty 1

## 2022-06-04 MED ORDER — ONDANSETRON HCL 4 MG/2ML IJ SOLN
4.0000 mg | Freq: Four times a day (QID) | INTRAMUSCULAR | Status: DC | PRN
Start: 2022-06-04 — End: 2022-06-05
  Administered 2022-06-04: 4 mg via INTRAVENOUS
  Filled 2022-06-04: qty 2

## 2022-06-04 MED ORDER — INSULIN ASPART 100 UNIT/ML IJ SOLN
INTRAMUSCULAR | Status: AC
  Administered 2022-06-04: 2 [IU] via SUBCUTANEOUS
  Filled 2022-06-04: qty 1

## 2022-06-04 MED ORDER — FLUOXETINE HCL 20 MG PO TABS
40.0000 mg | ORAL_TABLET | Freq: Every day | ORAL | Status: DC
  Filled 2022-06-04: qty 2

## 2022-06-04 MED ORDER — OXYCODONE HCL 5 MG PO TABS: 5.0000 mg | ORAL_TABLET | Freq: Once | ORAL | Status: DC | PRN

## 2022-06-04 MED ORDER — LACTATED RINGERS IV SOLN: INTRAVENOUS | Status: DC

## 2022-06-04 MED ORDER — BUPIVACAINE HCL (PF) 0.25 % IJ SOLN
INTRAMUSCULAR | Status: DC | PRN
  Administered 2022-06-04: 10 mL

## 2022-06-04 MED ORDER — ONDANSETRON HCL 4 MG PO TABS: 4.0000 mg | ORAL_TABLET | Freq: Four times a day (QID) | ORAL | Status: DC | PRN

## 2022-06-04 MED ORDER — KETOROLAC TROMETHAMINE 30 MG/ML IJ SOLN
30.0000 mg | Freq: Once | INTRAMUSCULAR | Status: AC
  Administered 2022-06-04: 30 mg via INTRAVENOUS

## 2022-06-04 MED ORDER — ROCURONIUM BROMIDE 10 MG/ML (PF) SYRINGE
PREFILLED_SYRINGE | INTRAVENOUS | Status: DC | PRN
  Administered 2022-06-04: 70 mg via INTRAVENOUS

## 2022-06-04 MED ORDER — LOSARTAN POTASSIUM-HCTZ 100-12.5 MG PO TABS
1.0000 | ORAL_TABLET | Freq: Every day | ORAL | Status: DC
Start: 2022-06-04 — End: 2022-06-04

## 2022-06-04 MED ORDER — VANCOMYCIN HCL IN DEXTROSE 1-5 GM/200ML-% IV SOLN
1000.0000 mg | Freq: Once | INTRAVENOUS | Status: AC
  Administered 2022-06-04: 1000 mg via INTRAVENOUS
  Filled 2022-06-04: qty 200

## 2022-06-04 MED ORDER — ONDANSETRON HCL 4 MG/2ML IJ SOLN
INTRAMUSCULAR | Status: DC | PRN
  Administered 2022-06-04: 4 mg via INTRAVENOUS

## 2022-06-04 MED ORDER — CHLORHEXIDINE GLUCONATE CLOTH 2 % EX PADS: 6.0000 | MEDICATED_PAD | Freq: Once | CUTANEOUS | Status: DC

## 2022-06-04 MED ORDER — FLUOXETINE HCL 20 MG PO CAPS
40.0000 mg | ORAL_CAPSULE | Freq: Every day | ORAL | Status: DC
  Administered 2022-06-04 – 2022-06-05 (×2): 40 mg via ORAL
  Filled 2022-06-04 (×2): qty 2

## 2022-06-04 MED ORDER — EPHEDRINE SULFATE-NACL 50-0.9 MG/10ML-% IV SOSY
PREFILLED_SYRINGE | INTRAVENOUS | Status: DC | PRN
  Administered 2022-06-04: 10 mg via INTRAVENOUS

## 2022-06-04 MED ORDER — HYDROCODONE-ACETAMINOPHEN 5-325 MG PO TABS
2.0000 | ORAL_TABLET | ORAL | Status: DC | PRN
  Administered 2022-06-04 – 2022-06-05 (×5): 2 via ORAL
  Filled 2022-06-04 (×5): qty 2

## 2022-06-04 MED ORDER — CHLORHEXIDINE GLUCONATE 0.12 % MT SOLN
OROMUCOSAL | Status: AC
  Administered 2022-06-04: 15 mL via OROMUCOSAL
  Filled 2022-06-04: qty 15

## 2022-06-04 MED ORDER — DEXAMETHASONE SODIUM PHOSPHATE 10 MG/ML IJ SOLN
INTRAMUSCULAR | Status: DC | PRN
  Administered 2022-06-04: 4 mg via INTRAVENOUS

## 2022-06-04 MED ORDER — LIDOCAINE 5 % EX PTCH
1.0000 | MEDICATED_PATCH | CUTANEOUS | Status: DC
Start: 2022-06-04 — End: 2022-06-05
  Filled 2022-06-04 (×2): qty 1

## 2022-06-04 MED ORDER — ACETAMINOPHEN 650 MG RE SUPP: 650.0000 mg | RECTAL | Status: DC | PRN

## 2022-06-04 MED ORDER — MEPERIDINE HCL 25 MG/ML IJ SOLN: 6.2500 mg | INTRAMUSCULAR | Status: DC | PRN

## 2022-06-04 MED ORDER — SUGAMMADEX SODIUM 200 MG/2ML IV SOLN
INTRAVENOUS | Status: DC | PRN
  Administered 2022-06-04: 200 mg via INTRAVENOUS

## 2022-06-04 MED ORDER — FENTANYL CITRATE (PF) 250 MCG/5ML IJ SOLN
INTRAMUSCULAR | Status: DC | PRN
  Administered 2022-06-04 (×3): 50 ug via INTRAVENOUS
  Administered 2022-06-04 (×2): 25 ug via INTRAVENOUS
  Administered 2022-06-04: 50 ug via INTRAVENOUS

## 2022-06-04 MED ORDER — PROPOFOL 10 MG/ML IV BOLUS
INTRAVENOUS | Status: DC | PRN
  Administered 2022-06-04: 200 mg via INTRAVENOUS

## 2022-06-04 MED ORDER — MIDAZOLAM HCL 2 MG/2ML IJ SOLN: 0.5000 mg | Freq: Once | INTRAMUSCULAR | Status: DC | PRN

## 2022-06-04 MED ORDER — VANCOMYCIN HCL IN DEXTROSE 1-5 GM/200ML-% IV SOLN: 1000.0000 mg | INTRAVENOUS | Status: AC

## 2022-06-04 MED ORDER — THROMBIN 5000 UNITS EX SOLR
CUTANEOUS | Status: DC | PRN
  Administered 2022-06-04: 10000 [IU] via TOPICAL

## 2022-06-04 MED ORDER — INSULIN ASPART 100 UNIT/ML IJ SOLN: 0.0000 [IU] | INTRAMUSCULAR | Status: DC | PRN

## 2022-06-04 MED ORDER — ALUM & MAG HYDROXIDE-SIMETH 200-200-20 MG/5ML PO SUSP: 30.0000 mL | Freq: Four times a day (QID) | ORAL | Status: DC | PRN

## 2022-06-04 MED ORDER — ORAL CARE MOUTH RINSE: 15.0000 mL | Freq: Once | OROMUCOSAL | Status: AC

## 2022-06-04 MED ORDER — 0.9 % SODIUM CHLORIDE (POUR BTL) OPTIME
TOPICAL | Status: DC | PRN
  Administered 2022-06-04: 1000 mL

## 2022-06-04 MED ORDER — BUPIVACAINE HCL (PF) 0.25 % IJ SOLN
INTRAMUSCULAR | Status: AC
  Filled 2022-06-04: qty 30

## 2022-06-04 MED ORDER — HYDROMORPHONE HCL 1 MG/ML IJ SOLN
0.2500 mg | INTRAMUSCULAR | Status: DC | PRN
  Administered 2022-06-04 (×4): 0.5 mg via INTRAVENOUS

## 2022-06-04 MED ORDER — LIDOCAINE-EPINEPHRINE 1 %-1:100000 IJ SOLN
INTRAMUSCULAR | Status: DC | PRN
  Administered 2022-06-04: 10 mL

## 2022-06-04 MED ORDER — LOSARTAN POTASSIUM 50 MG PO TABS
100.0000 mg | ORAL_TABLET | Freq: Every day | ORAL | Status: DC
Start: 2022-06-04 — End: 2022-06-05
  Administered 2022-06-05: 100 mg via ORAL
  Filled 2022-06-04: qty 2

## 2022-06-04 MED ORDER — FENTANYL CITRATE (PF) 250 MCG/5ML IJ SOLN
INTRAMUSCULAR | Status: AC
  Filled 2022-06-04: qty 5

## 2022-06-04 MED ORDER — OXYCODONE HCL 5 MG/5ML PO SOLN: 5.0000 mg | Freq: Once | ORAL | Status: DC | PRN

## 2022-06-04 MED ORDER — HYDROCHLOROTHIAZIDE 12.5 MG PO TABS
12.5000 mg | ORAL_TABLET | Freq: Every day | ORAL | Status: DC
Start: 2022-06-04 — End: 2022-06-05
  Administered 2022-06-05: 12.5 mg via ORAL
  Filled 2022-06-04: qty 1

## 2022-06-04 MED ORDER — CYCLOBENZAPRINE HCL 10 MG PO TABS
10.0000 mg | ORAL_TABLET | Freq: Three times a day (TID) | ORAL | Status: DC | PRN
  Administered 2022-06-04 – 2022-06-05 (×2): 10 mg via ORAL
  Filled 2022-06-04 (×2): qty 1

## 2022-06-04 MED ORDER — PROMETHAZINE HCL 25 MG/ML IJ SOLN: 6.2500 mg | INTRAMUSCULAR | Status: DC | PRN

## 2022-06-04 MED ORDER — INSULIN ASPART 100 UNIT/ML IJ SOLN
INTRAMUSCULAR | Status: DC | PRN
  Administered 2022-06-04: 2 [IU] via SUBCUTANEOUS

## 2022-06-04 MED ORDER — SODIUM CHLORIDE 0.9% FLUSH: 3.0000 mL | INTRAVENOUS | Status: DC | PRN

## 2022-06-04 MED ORDER — MIDAZOLAM HCL 2 MG/2ML IJ SOLN
INTRAMUSCULAR | Status: AC
  Filled 2022-06-04: qty 2

## 2022-06-04 MED ORDER — EMPAGLIFLOZIN 10 MG PO TABS
10.0000 mg | ORAL_TABLET | Freq: Every day | ORAL | Status: DC
  Administered 2022-06-04 – 2022-06-05 (×2): 10 mg via ORAL
  Filled 2022-06-04 (×2): qty 1

## 2022-06-04 MED ORDER — LIDOCAINE 2% (20 MG/ML) 5 ML SYRINGE
INTRAMUSCULAR | Status: DC | PRN
  Administered 2022-06-04: 40 mg via INTRAVENOUS

## 2022-06-04 SURGICAL SUPPLY — 52 items
BAG COUNTER SPONGE SURGICOUNT (BAG) ×2 IMPLANT
BAND RUBBER #18 3X1/16 STRL (MISCELLANEOUS) ×4 IMPLANT
BENZOIN TINCTURE PRP APPL 2/3 (GAUZE/BANDAGES/DRESSINGS) ×2 IMPLANT
BLADE CLIPPER SURG (BLADE) IMPLANT
BLADE SURG 11 STRL SS (BLADE) ×2 IMPLANT
BUR CUTTER 7.0 ROUND (BURR) ×2 IMPLANT
BUR MATCHSTICK NEURO 3.0 LAGG (BURR) ×2 IMPLANT
CANISTER SUCT 3000ML PPV (MISCELLANEOUS) ×2 IMPLANT
CARTRIDGE OIL MAESTRO DRILL (MISCELLANEOUS) ×1 IMPLANT
CLSR STERI-STRIP ANTIMIC 1/2X4 (GAUZE/BANDAGES/DRESSINGS) ×1 IMPLANT
DERMABOND ADVANCED (GAUZE/BANDAGES/DRESSINGS) ×1
DERMABOND ADVANCED .7 DNX12 (GAUZE/BANDAGES/DRESSINGS) ×1 IMPLANT
DIFFUSER DRILL AIR PNEUMATIC (MISCELLANEOUS) ×2 IMPLANT
DRAPE HALF SHEET 40X57 (DRAPES) IMPLANT
DRAPE LAPAROTOMY 100X72X124 (DRAPES) ×2 IMPLANT
DRAPE MICROSCOPE LEICA (MISCELLANEOUS) ×2 IMPLANT
DRAPE SURG 17X23 STRL (DRAPES) ×2 IMPLANT
DRSG OPSITE POSTOP 4X6 (GAUZE/BANDAGES/DRESSINGS) ×1 IMPLANT
DURAPREP 26ML APPLICATOR (WOUND CARE) ×2 IMPLANT
ELECT REM PT RETURN 9FT ADLT (ELECTROSURGICAL) ×2
ELECTRODE REM PT RTRN 9FT ADLT (ELECTROSURGICAL) ×1 IMPLANT
GAUZE 4X4 16PLY ~~LOC~~+RFID DBL (SPONGE) ×1 IMPLANT
GAUZE SPONGE 4X4 12PLY STRL (GAUZE/BANDAGES/DRESSINGS) ×2 IMPLANT
GLOVE BIO SURGEON STRL SZ7 (GLOVE) IMPLANT
GLOVE BIO SURGEON STRL SZ8 (GLOVE) ×2 IMPLANT
GLOVE BIOGEL PI IND STRL 7.0 (GLOVE) IMPLANT
GLOVE BIOGEL PI INDICATOR 7.0 (GLOVE)
GLOVE EXAM NITRILE XL STR (GLOVE) IMPLANT
GLOVE INDICATOR 8.5 STRL (GLOVE) ×4 IMPLANT
GOWN STRL REUS W/ TWL LRG LVL3 (GOWN DISPOSABLE) ×1 IMPLANT
GOWN STRL REUS W/ TWL XL LVL3 (GOWN DISPOSABLE) ×2 IMPLANT
GOWN STRL REUS W/TWL 2XL LVL3 (GOWN DISPOSABLE) IMPLANT
GOWN STRL REUS W/TWL LRG LVL3 (GOWN DISPOSABLE) ×2
GOWN STRL REUS W/TWL XL LVL3 (GOWN DISPOSABLE) ×4
KIT BASIN OR (CUSTOM PROCEDURE TRAY) ×2 IMPLANT
KIT TURNOVER KIT B (KITS) ×2 IMPLANT
NDL SPNL 22GX3.5 QUINCKE BK (NEEDLE) ×1 IMPLANT
NEEDLE HYPO 22GX1.5 SAFETY (NEEDLE) ×2 IMPLANT
NEEDLE SPNL 22GX3.5 QUINCKE BK (NEEDLE) ×2 IMPLANT
NS IRRIG 1000ML POUR BTL (IV SOLUTION) ×2 IMPLANT
OIL CARTRIDGE MAESTRO DRILL (MISCELLANEOUS) ×2
PACK LAMINECTOMY NEURO (CUSTOM PROCEDURE TRAY) ×2 IMPLANT
SPIKE FLUID TRANSFER (MISCELLANEOUS) ×2 IMPLANT
SPONGE SURGIFOAM ABS GEL SZ50 (HEMOSTASIS) ×2 IMPLANT
STRIP CLOSURE SKIN 1/2X4 (GAUZE/BANDAGES/DRESSINGS) ×2 IMPLANT
SUT VIC AB 0 CT1 18XCR BRD8 (SUTURE) ×1 IMPLANT
SUT VIC AB 0 CT1 8-18 (SUTURE) ×2
SUT VIC AB 2-0 CT1 18 (SUTURE) ×2 IMPLANT
SUT VICRYL 4-0 PS2 18IN ABS (SUTURE) ×2 IMPLANT
TOWEL GREEN STERILE (TOWEL DISPOSABLE) ×2 IMPLANT
TOWEL GREEN STERILE FF (TOWEL DISPOSABLE) ×2 IMPLANT
WATER STERILE IRR 1000ML POUR (IV SOLUTION) ×2 IMPLANT

## 2022-06-04 NOTE — Progress Notes (Signed)
Pharmacy Antibiotic Note  Dearia Wilmouth is a 41 y.o. female admitted on 06/04/2022 with surgical prophylaxis.  Pharmacy has been consulted for vancomycin dosing. No drains placed.  Plan: Vancomycin '1000mg'$  IV x1  Height: '5\' 6"'$  (167.6 cm) Weight: 91.2 kg (201 lb) IBW/kg (Calculated) : 59.3  Temp (24hrs), Avg:97.8 F (36.6 C), Min:97.1 F (36.2 C), Max:98.2 F (36.8 C)  Recent Labs  Lab 06/03/22 1322  WBC 10.0  CREATININE 0.52    Estimated Creatinine Clearance: 103.2 mL/min (by C-G formula based on SCr of 0.52 mg/dL).    Allergies  Allergen Reactions   Bee Venom Anaphylaxis   Penicillins Anaphylaxis and Other (See Comments)    Has patient had a PCN reaction causing immediate rash, facial/tongue/throat swelling, SOB or lightheadedness with hypotension: Yes Has patient had a PCN reaction causing severe rash involving mucus membranes or skin necrosis: No Has patient had a PCN reaction that required hospitalization: Yes Has patient had a PCN reaction occurring within the last 10 years: No If all of the above answers are "NO", then may proceed with Cephalosporin use.    Mushroom Extract Complex Nausea And Vomiting   Ciprofloxacin Rash   Powder Itching, Rash and Other (See Comments)    The powder from latex gloves    Thank you for allowing pharmacy to be a part of this patient's care.  Ardyth Harps, PharmD Clinical Pharmacist

## 2022-06-04 NOTE — Anesthesia Procedure Notes (Signed)
Procedure Name: Intubation Date/Time: 06/04/2022 7:39 AM  Performed by: Reeves Dam, CRNAPre-anesthesia Checklist: Patient identified, Patient being monitored, Timeout performed, Emergency Drugs available and Suction available Patient Re-evaluated:Patient Re-evaluated prior to induction Oxygen Delivery Method: Circle system utilized Preoxygenation: Pre-oxygenation with 100% oxygen Induction Type: IV induction Ventilation: Mask ventilation without difficulty Laryngoscope Size: 3 and Glidescope Grade View: Grade I Tube type: Oral Tube size: 7.5 mm Number of attempts: 1 Airway Equipment and Method: Stylet and Video-laryngoscopy Placement Confirmation: ETT inserted through vocal cords under direct vision, positive ETCO2 and breath sounds checked- equal and bilateral Secured at: 21 cm Tube secured with: Tape Dental Injury: Teeth and Oropharynx as per pre-operative assessment

## 2022-06-04 NOTE — Anesthesia Postprocedure Evaluation (Signed)
Anesthesia Post Note  Patient: [name]  Procedure(s) Performed: Microdiscectomy - right - Lumbar five-Sacral one re-exploration for recurrent disc (Right: Back)     Patient location during evaluation: PACU Anesthesia Type: General Level of consciousness: awake and alert, patient cooperative and oriented Pain management: pain level controlled Vital Signs Assessment: post-procedure vital signs reviewed and stable Respiratory status: spontaneous breathing, nonlabored ventilation and respiratory function stable Cardiovascular status: blood pressure returned to baseline and stable Postop Assessment: no apparent nausea or vomiting Anesthetic complications: no   No notable events documented.  Last Vitals:  Vitals:   06/04/22 1047 06/04/22 1047  BP: (!) 143/83   Pulse: (!) 53 60  Resp: 17   Temp: 36.6 C   SpO2: 96% 95%    Last Pain:  Vitals:   06/04/22 0930  TempSrc:   PainSc: 8                  Zyona Pettaway,E. Jadelyn Elks

## 2022-06-04 NOTE — Transfer of Care (Signed)
Immediate Anesthesia Transfer of Care Note  Patient: [name]  Procedure(s) Performed: Microdiscectomy - right - Lumbar five-Sacral one re-exploration for recurrent disc (Right: Back)  Patient Location: PACU  Anesthesia Type:General  Level of Consciousness: awake, drowsy and patient cooperative  Airway & Oxygen Therapy: Patient Spontanous Breathing and Patient connected to face mask oxygen  Post-op Assessment: Report given to RN, Post -op Vital signs reviewed and stable and Patient moving all extremities X 4  Post vital signs: Reviewed and stable  Last Vitals:  Vitals Value Taken Time  BP 132/72 06/04/22 0920  Temp 36.2 C 06/04/22 0920  Pulse 79 06/04/22 0926  Resp 16 06/04/22 0926  SpO2 92 % 06/04/22 0926  Vitals shown include unvalidated device data.  Last Pain:  Vitals:   06/04/22 0920  TempSrc:   PainSc: Asleep      Patients Stated Pain Goal: 2 (12/75/17 0017)  Complications: No notable events documented.

## 2022-06-04 NOTE — H&P (Signed)
[name] is an 41 y.o. female.   Chief Complaint: Back and right leg pain HPI: 41 year old female who few months ago underwent limited microdiscectomy and did well for the first few weeks however started experiencing recurrent back and leg pain work-up showed possible recurrent disc patient was treated conservatively initially with epidural steroid injections oral steroids anti-inflammatories and therapy without response she is gotten progressively worse right S1 radicular symptoms.  So due to her failed conservative treatment imaging findings and progression of clinical syndrome I recommended reexploration and redo microdiscectomy at L5-S1 on the right.  I have extensively gone over the risks and benefits of that operation with her as well as perioperative course expectations of outcome and alternatives of surgery and she understands and agrees to proceed forward.  Past Medical History:  Diagnosis Date   Abdominal lipoma 05/2015   Abdominal pain, RLQ 10/03/2017   Acute medial meniscus tear of right knee 06/09/2019   Anxiety    Arthritis    lower back   Depression 2019   DM type 2 with diabetic peripheral neuropathy (HCC)    Borderline   GERD (gastroesophageal reflux disease)    Headache    Migraines   History of MRSA infection ~ 2006   leg   History of shingles    Hx of hydronephrosis    following total hysterectomy surgery ; Right kidney ; see CT abdomen 10-02-17 epic; states " a plug had got damaged during the surgery so thats why they put the stent in"    Hypertension    under control with med., has been on med. x 6 mos.   Lumbosacral spondylolysis    see MRI 06-01-17 epic    Mood swing    Optic nerve swelling    seen by Dr Mora Bellman 09-07-17, did lumbar puncture 09-19-17; patient states " they never called me about results ut i have an appt with him in april"; patient also states  " my vision is still blurry and worse when i get the migraines, they think the migraines are  causing it"   Psoriasis     Past Surgical History:  Procedure Laterality Date   CESAREAN SECTION  06/21/2008; 08/18/2010   CESAREAN SECTION N/A 07/10/2014   Procedure: REPEAT CESAREAN SECTION;  Surgeon: Daria Pastures, MD;  Location: [facility];  Service: Obstetrics;  Laterality: N/A;   CHOLECYSTECTOMY  07/10/2008   CYSTOSCOPY N/A 09/28/2017   Procedure: CYSTOSCOPY;  Surgeon: Bobbye Charleston, MD;  Location: [facility];  Service: Gynecology;  Laterality: N/A;   CYSTOSCOPY Right 01/18/2018   Procedure: CYSTOSCOPY, RETROGRADE / PYELOGRAM, URETEROSCOPY;  Surgeon: Ceasar Mons, MD;  Location: [facility];  Service: Urology;  Laterality: Right;   CYSTOSCOPY W/ URETERAL STENT PLACEMENT Right 10/03/2017   Procedure: CYSTOSCOPY WITH RETROGRADE PYELOGRAM/URETERAL STENT PLACEMENT;  Surgeon: Ceasar Mons, MD;  Location: [facility];  Service: Urology;  Laterality: Right;   CYSTOSCOPY W/ URETERAL STENT PLACEMENT Right 02/22/2018   Procedure: CYSTOSCOPY WITH URETERAL STENT PLACEMENT;  Surgeon: Ceasar Mons, MD;  Location: [facility];  Service: Urology;  Laterality: Right;   KNEE ARTHROSCOPY Right 09/17/2021   Procedure: ARTHROSCOPY KNEE WITH SYNOVECTOMY with partial menisectomy;  Surgeon: Hiram Gash, MD;  Location: [place];  Service: Orthopedics;  Laterality: Right;   KNEE ARTHROSCOPY WITH MEDIAL MENISECTOMY Right 06/26/2019   Procedure: KNEE ARTHROSCOPY WITH PARTIAL MEDIAL MENISECTOMY, REMOVAL LOOSE BODY;  Surgeon: Elsie Saas, MD;  Location: [place];  Service: Orthopedics;  Laterality: Right;   LIPOMA EXCISION  2014   back   LIPOMA EXCISION N/A 05/21/2015   Procedure: EXCISION LIPOMAS ON ABDOMEN;  Surgeon: Erroll Luna, MD;  Location: [place];  Service: General;  Laterality: N/A;   LUMBAR LAMINECTOMY/DECOMPRESSION MICRODISCECTOMY Right 01/23/2020   Procedure: Microdiscectomy right - Lumbar five-Sacral one;   Surgeon: Kary Kos, MD;  Location: [place];  Service: Neurosurgery;  Laterality: Right;   LUMBAR LAMINECTOMY/DECOMPRESSION MICRODISCECTOMY Right 01/01/2022   Procedure: Microdiscectomy Right Lumbar Five - Sacral One Redo;  Surgeon: Kary Kos, MD;  Location: [place];  Service: Neurosurgery;  Laterality: Right;   LUMBAR PUNCTURE  09/19/17   [facility], Dr. Curt Bears   OOPHORECTOMY Left 09/28/2017   Procedure: OOPHORECTOMY;  Surgeon: Bobbye Charleston, MD;  Location: [facility];  Service: Gynecology;  Laterality: Left;   ROBOTIC ASSISTED LAPAROSCOPIC LYSIS OF ADHESION N/A 09/28/2017   Procedure: ROBOTIC ASSISTED LAPAROSCOPIC LYSIS OF ADHESION;  Surgeon: Bobbye Charleston, MD;  Location: [facility];  Service: Gynecology;  Laterality: N/A;   ROBOTIC ASSISTED TOTAL HYSTERECTOMY WITH BILATERAL SALPINGO OOPHERECTOMY Bilateral 09/28/2017   Procedure: ROBOTIC ASSISTED TOTAL HYSTERECTOMY WITH BILATERAL SALPINGECTOMY;  Surgeon: Bobbye Charleston, MD;  Location: [facility];  Service: Gynecology;  Laterality: Bilateral;   ROBOTICALLY ASSISTED LAPAROSCOPIC URETERAL RE-IMPLANTATION Right 02/22/2018   Procedure: ROBOTICALLY ASSISTED LAPAROSCOPIC URETERAL RE-IMPLANTATION;  Surgeon: Ceasar Mons, MD;  Location: [facility];  Service: Urology;  Laterality: Right;  ONLY NEEDS 240 MIN TOTAL FOR ALL PROCEDURES   TUBAL LIGATION     WISDOM TOOTH EXTRACTION      Family History  Problem Relation Age of Onset   Drug abuse Mother    Heart disease Maternal Grandmother    Heart disease Maternal Grandfather    Social History:  reports that she has been smoking cigarettes. She started smoking about 8 years ago. She has a 8.00 pack-year smoking history. She has never used smokeless tobacco. She reports that she does not drink alcohol and does not use drugs.  Allergies:  Allergies  Allergen Reactions   Bee Venom Anaphylaxis   Penicillins Anaphylaxis and Other (See Comments)    Has patient had a PCN reaction causing  immediate rash, facial/tongue/throat swelling, SOB or lightheadedness with hypotension: Yes Has patient had a PCN reaction causing severe rash involving mucus membranes or skin necrosis: No Has patient had a PCN reaction that required hospitalization: Yes Has patient had a PCN reaction occurring within the last 10 years: No If all of the above answers are "NO", then may proceed with Cephalosporin use.    Mushroom Extract Complex Nausea And Vomiting   Ciprofloxacin Rash   Powder Itching, Rash and Other (See Comments)    The powder from latex gloves    Medications Prior to Admission  Medication Sig Dispense Refill   cyclobenzaprine (FLEXERIL) 10 MG tablet Take 1 tablet (10 mg total) by mouth 3 (three) times daily as needed for muscle spasms. 30 tablet 2   empagliflozin (JARDIANCE) 10 MG TABS tablet Take 1 tablet (10 mg total) by mouth daily. 90 tablet 3   FLUoxetine (PROZAC) 40 MG capsule Take 1 capsule (40 mg total) by mouth daily. 30 capsule 3   losartan-hydrochlorothiazide (HYZAAR) 100-12.5 MG tablet Take 1 tablet by mouth daily. 90 tablet 3   SUMAtriptan (IMITREX) 50 MG tablet Take 1 tablet (50 mg total) by mouth every 2 (two) hours as needed for migraine. May repeat in 2 hours if headache persists or recurs. 10  tablet 3   amLODipine (NORVASC) 10 MG tablet Take 1 tablet (10 mg total) by mouth at bedtime. (Patient not taking: Reported on 12/17/2021) 90 tablet 3   lidocaine (LIDODERM) 5 % Place 1 patch onto the skin daily. Remove & Discard patch within 12 hours or as directed by MD (Patient not taking: Reported on 12/17/2021) 30 patch 0    Results for orders placed or performed during the hospital encounter of 06/04/22 (from the past 48 hour(s))  Glucose, capillary     Status: Abnormal   Collection Time: 06/04/22  5:42 AM  Result Value Ref Range   Glucose-Capillary 157 (H) 70 - 99 mg/dL    Comment: Glucose reference range applies only to samples taken after fasting for at least 8 hours.    No results found.  Review of Systems  Musculoskeletal:  Positive for back pain.  Neurological:  Positive for numbness.    Blood pressure (!) 162/88, pulse 70, temperature 98.1 F (36.7 C), temperature source Oral, resp. rate 18, height '5\' 6"'$  (1.676 m), weight 91.2 kg, last menstrual period 09/11/2017, SpO2 98 %. Physical Exam HENT:     Head: Normocephalic.     Right Ear: Tympanic membrane normal.     Nose: Nose normal.     Mouth/Throat:     Mouth: Mucous membranes are moist.  Eyes:     Pupils: Pupils are equal, round, and reactive to light.  Cardiovascular:     Rate and Rhythm: Normal rate.     Pulses: Normal pulses.  Pulmonary:     Effort: Pulmonary effort is normal.  Abdominal:     General: Abdomen is flat.  Musculoskeletal:        General: Normal range of motion.  Neurological:     Mental Status: She is alert.     Comments: Patient awake alert strength is 5 out of 5 iliopsoas, quads, hamstrings, gastroc, into tibialis, and EHL.      Assessment/Plan 41 year old patient presents for redo L5-S1 microdiscectomy for recurrent disc  Elaina Hoops, MD 06/04/2022, 7:22 AM

## 2022-06-04 NOTE — Op Note (Signed)
Preoperative diagnosis: Recurrent herniated nucleus pulposus L5-S1 right.  Postoperative diagnosis: Same.  Procedure: Redo lumbar laminectomy microdiscectomy L5-S1 on the right with micro scopic dissection and foraminotomies the S1 nerve root and microscopic discectomy  Surgeon: Kary Kos.  Assistant: Nash Shearer.  EBL: Minimal.  Anesthesia General.  HPI: 41 year old female who a few months ago underwent lumbar microdiscectomy and did fairly well however over the last couple months has had recurrent right leg symptoms work-up revealed recurrent disc herniation L5-S1 and due to the patient's failed conservative treatment imaging findings and progression of clinical syndrome I recommended redo microdiscectomy at L5-S1 on the right.  I extensively reviewed the risks and benefits of the operation with the patient as well as perioperative course expectations of outcome and alternatives of surgery and she understood and agreed to proceed forward.  Operative procedure: Patient was brought into the OR was [facility] general anesthesia positioned prone the Wilson frame her back was prepped and draped in routine sterile fashion roll incision was opened up after being infiltrated with 10 cc lidocaine with epi.  Scar tissue was dissected free and subperiosteal dissection was carried out lamina initially at L4-5 which was confirmed by interoperative x-ray however we redirected to 1 interspace below this we position the retractor and dissected the scar tissue off of the laminotomy defect at L5-S1.  The scar tissue was freed off of the residual of the lamina at the superior aspect of S1 medial facet complex and then I extended the foraminotomies unroofed the foramen there was some spurring causing compression of the dorsal aspect of the S1 nerve root this was all removed the S1 pedicle was identified the S1 nerve was dissected off the pedicle and working superiorly identified the disc base and disc base was then  incised did find some recurrent disc encased in scar underneath thecal sac at the level of the disc base this was all removed disc base was cleaned out and at the end of discectomy there is no further stenosis and thecal sac or right S1 nerve root.  The wounds and copiously irrigated meticulous hemostasis was maintained Gelfoam was overlaid top of the dura the muscle fascia approximate layers with interrupted Vicryl skin was closed running 4 subcuticular Dermabond benzoin Steri-Strips and a sterile dressing was applied patient recovery in stable condition.  At the end the case all needle counts and sponge counts were correct.

## 2022-06-04 NOTE — Evaluation (Signed)
Physical Therapy Evaluation  Patient Details Name: [name] MRN: [id number] DOB: [birth year] Today's Date: 06/04/2022  History of Present Illness  Pt is a 41 y/o female who presents s/p redo laminectomy/microdiscectomy L5-S1 on 06/04/2022. PMH significant for R knee medial meniscus tear, DM II, HTN, prior back surgery.   Clinical Impression  Pt admitted with above diagnosis. At the time of PT eval, pt was able to demonstrate transfers and ambulation with gross min guard assist and RW for support. Pt very nauseated and reports dizziness with OOB mobility. BP during this time 177/100 and RN aware. Pt was educated on precautions and positioning recommendations. Pt currently with functional limitations due to the deficits listed below (see PT Problem List). Pt will benefit from skilled PT to increase their independence and safety with mobility to allow discharge to the venue listed below.         Recommendations for follow up therapy are one component of a multi-disciplinary discharge planning process, led by the attending physician.  Recommendations may be updated based on patient status, additional functional criteria and insurance authorization.  Follow Up Recommendations No PT follow up      Assistance Recommended at Discharge Intermittent Supervision/Assistance  Patient can return home with the following  A little help with walking and/or transfers;A little help with bathing/dressing/bathroom;Assistance with cooking/housework;Assist for transportation;Help with stairs or ramp for entrance    Equipment Recommendations Rolling walker (2 wheels)  Recommendations for Other Services       Functional Status Assessment Patient has had a recent decline in their functional status and demonstrates the ability to make significant improvements in function in a reasonable and predictable amount of time.     Precautions / Restrictions Precautions Precautions: Fall;Back Precaution Booklet  Issued: Yes (comment) Precaution Comments: Reviewed handout and pt was cued for precautions during functional mobility Required Braces or Orthoses:  (No brace needed order) Restrictions Weight Bearing Restrictions: No      Mobility  Bed Mobility Overal bed mobility: Modified Independent Bed Mobility: Sidelying to Sit, Sit to Sidelying, Rolling           General bed mobility comments: HOB slightly elevated and min use of rails.    Transfers Overall transfer level: Needs assistance Equipment used: Rolling walker (2 wheels) Transfers: Sit to/from Stand Sit to Stand: Supervision           General transfer comment: Close supervision for safety. No assist required but pt appears guarded due to pain.    Ambulation/Gait Ambulation/Gait assistance: Min guard Gait Distance (Feet): 10 Feet (5' forward, 5' backwards) Assistive device: Rolling walker (2 wheels) Gait Pattern/deviations: Step-to pattern, Decreased stride length, Trunk flexed Gait velocity: Decreased Gait velocity interpretation: <1.31 ft/sec, indicative of household ambulator   General Gait Details: Pt ambulated ~5 feet before stopping. Increased time to verbally answer therapist when attempting to discern what was limiting pt. She finally reported she was nauseated and lightheaded. Upon return to sitting, BP 177/100. NT present for vitals and aware.  Stairs            Wheelchair Mobility    Modified Rankin (Stroke Patients Only)       Balance Overall balance assessment: Mild deficits observed, not formally tested                                           Pertinent Vitals/Pain Pain Assessment  Pain Assessment: Faces Faces Pain Scale: Hurts even more Pain Location: back Pain Descriptors / Indicators: Operative site guarding, Sore Pain Intervention(s): Limited activity within patient's tolerance, Monitored during session, Repositioned    Home Living Family/patient expects to be  discharged to:: Private residence Living Arrangements: Spouse/significant other;Children Available Help at Discharge: Family Type of Home: House Home Access: Stairs to enter Entrance Stairs-Rails: None Entrance Stairs-Number of Steps: 2   Home Layout: One level Home Equipment: Conservation officer, nature (2 wheels);Cane - single point      Prior Function Prior Level of Function : Independent/Modified Independent             Mobility Comments: using cane for short distances and rollator for long distances.       Hand Dominance        Extremity/Trunk Assessment   Upper Extremity Assessment Upper Extremity Assessment: Defer to OT evaluation    Lower Extremity Assessment Lower Extremity Assessment: Generalized weakness (mild; consistent with pre-op diagnosis)    Cervical / Trunk Assessment Cervical / Trunk Assessment: Back Surgery  Communication   Communication: No difficulties  Cognition Arousal/Alertness: Lethargic, Suspect due to medications Behavior During Therapy: Flat affect Overall Cognitive Status: Within Functional Limits for tasks assessed                                          General Comments      Exercises     Assessment/Plan    PT Assessment Patient needs continued PT services  PT Problem List Decreased strength;Decreased activity tolerance;Decreased balance;Decreased mobility;Decreased knowledge of use of DME;Decreased safety awareness;Decreased knowledge of precautions;Pain       PT Treatment Interventions DME instruction;Gait training;Stair training;Functional mobility training;Therapeutic activities;Therapeutic exercise;Balance training;Patient/family education    PT Goals (Current goals can be found in the Care Plan section)  Acute Rehab PT Goals Patient Stated Goal: Initially at beginning of session pt wanting to go home at 1500 this afternoon, however after mobilizing during session reports she thinks she needs to stay. PT Goal  Formulation: With patient Time For Goal Achievement: 06/11/22 Potential to Achieve Goals: Good    Frequency Min 5X/week     Co-evaluation               AM-PAC PT "6 Clicks" Mobility  Outcome Measure Help needed turning from your back to your side while in a flat bed without using bedrails?: None Help needed moving from lying on your back to sitting on the side of a flat bed without using bedrails?: None Help needed moving to and from a bed to a chair (including a wheelchair)?: A Little Help needed standing up from a chair using your arms (e.g., wheelchair or bedside chair)?: A Little Help needed to walk in hospital room?: A Little Help needed climbing 3-5 steps with a railing? : A Little 6 Click Score: 20    End of Session Equipment Utilized During Treatment: Gait belt Activity Tolerance: Patient limited by fatigue Patient left: in bed;with call bell/phone within reach Nurse Communication: Mobility status (BP status) PT Visit Diagnosis: Unsteadiness on feet (R26.81);Pain Pain - part of body:  (back)    Time: 8469-6295 PT Time Calculation (min) (ACUTE ONLY): 20 min   Charges:   PT Evaluation $PT Eval Low Complexity: 1 Low          Rolinda Roan, PT, DPT Acute Rehabilitation Services Secure Chat Preferred Office:  [phone]   Thelma Comp 06/04/2022, 3:41 PM

## 2022-06-05 ENCOUNTER — Encounter (HOSPITAL_COMMUNITY): Payer: Self-pay | Admitting: Neurosurgery

## 2022-06-05 DIAGNOSIS — M5126 Other intervertebral disc displacement, lumbar region: Secondary | ICD-10-CM | POA: Diagnosis not present

## 2022-06-05 DIAGNOSIS — K219 Gastro-esophageal reflux disease without esophagitis: Secondary | ICD-10-CM | POA: Diagnosis not present

## 2022-06-05 DIAGNOSIS — F17210 Nicotine dependence, cigarettes, uncomplicated: Secondary | ICD-10-CM | POA: Diagnosis not present

## 2022-06-05 DIAGNOSIS — E119 Type 2 diabetes mellitus without complications: Secondary | ICD-10-CM | POA: Diagnosis not present

## 2022-06-05 DIAGNOSIS — I10 Essential (primary) hypertension: Secondary | ICD-10-CM | POA: Diagnosis not present

## 2022-06-05 DIAGNOSIS — Z7984 Long term (current) use of oral hypoglycemic drugs: Secondary | ICD-10-CM | POA: Diagnosis not present

## 2022-06-05 LAB — GLUCOSE, CAPILLARY: Glucose-Capillary: 147 mg/dL — ABNORMAL HIGH (ref 70–99)

## 2022-06-05 NOTE — Plan of Care (Signed)
  Problem: Education: Goal: Ability to verbalize activity precautions or restrictions will improve Outcome: Completed/Met Goal: Knowledge of the prescribed therapeutic regimen will improve Outcome: Completed/Met Goal: Understanding of discharge needs will improve Outcome: Completed/Met   Problem: Activity: Goal: Ability to avoid complications of mobility impairment will improve Outcome: Completed/Met Goal: Ability to tolerate increased activity will improve Outcome: Completed/Met Goal: Will remain free from falls Outcome: Completed/Met   Problem: Bowel/Gastric: Goal: Gastrointestinal status for postoperative course will improve Outcome: Completed/Met   Problem: Clinical Measurements: Goal: Ability to maintain clinical measurements within normal limits will improve Outcome: Completed/Met Goal: Postoperative complications will be avoided or minimized Outcome: Completed/Met Goal: Diagnostic test results will improve Outcome: Completed/Met   Problem: Pain Management: Goal: Pain level will decrease Outcome: Completed/Met   Problem: Skin Integrity: Goal: Will show signs of wound healing Outcome: Completed/Met   Problem: Skin Integrity: Goal: Will show signs of wound healing Outcome: Completed/Met   Problem: Health Behavior/Discharge Planning: Goal: Identification of resources available to assist in meeting health care needs will improve Outcome: Completed/Met   Problem: Bladder/Genitourinary: Goal: Urinary functional status for postoperative course will improve Outcome: Completed/Met

## 2022-06-05 NOTE — Discharge Instructions (Signed)
Wound Care Keep incision covered and dry for two days.    Do not put any creams, lotions, or ointments on incision. Leave steri-strips on back.  They will fall off by themselves. You are fine to shower. Let water run over incision and pat dry.  Activity Walk each and every day, increasing distance each day. No lifting greater than 5 lbs.  Avoid excessive back motion. No driving for 2 weeks; may ride as a passenger locally.  Diet Resume your normal diet.   Return to Work Will be discussed at your follow up appointment.  Call Your Doctor If Any of These Occur Redness, drainage, or swelling at the wound.  Temperature greater than 101 degrees. Severe pain not relieved by pain medication. Incision starts to come apart.  Follow Up Appt Call [phone] today for appointment in 2-3 weeks if you don't already have one or for any problems. 

## 2022-06-05 NOTE — Discharge Summary (Signed)
Physician Discharge Summary     Providing Compassionate, Quality Care - Together   Patient ID: [name] MRN: [id number] DOB/AGE: 43/43/1982 43 y.o.  Admit date: 06/04/2022 Discharge date: 06/05/2022  Admission Diagnoses: HNP  Discharge Diagnoses:  Principal Problem:   HNP (herniated nucleus pulposus), lumbar   Discharged Condition: good  Hospital Course: Patient underwent a redo right-sided L5-S1 microdiscectomy by Dr. Saintclair Halsted on 06/04/2022. She was admitted to 3C10 following recovery from anesthesia in the PACU. Her postoperative course has been uncomplicated. She has worked with physical therapy who feels the patient is ready for discharge home. She is ambulating independently and without difficulty. She is tolerating a normal diet. She is not having any bowel or bladder dysfunction. Her pain is well-controlled with oral pain medication. She is ready for discharge home.   Consults: PT  Significant Diagnostic Studies: DG Lumbar Spine 1 View  Result Date: 06/04/2022 CLINICAL DATA:  Surgical localization. EXAM: LUMBAR SPINE - 1 VIEW COMPARISON:  MRI of Mar 09, 2022. FINDINGS: Single intraoperative cross-table lateral projection was obtained of the lumbar spine. This demonstrates surgical probe directed toward the L4-5 disc space. IMPRESSION: Surgical localization as described above. Electronically Signed   By: Marijo Conception M.D.   On: 06/04/2022 10:25     Treatments: surgery:  Redo lumbar laminectomy microdiscectomy L5-S1 on the right with micro scopic dissection and foraminotomies the S1 nerve root and microscopic discectomy  Discharge Exam: Blood pressure (!) 143/80, pulse 61, temperature 98.5 F (36.9 C), temperature source Oral, resp. rate 20, height '5\' 6"'$  (1.676 m), weight 91.2 kg, last menstrual period 09/11/2017, SpO2 97 %.  Alert and oriented x 4 PERRLA CN II-XII grossly intact MAE, Strength and sensation intact Incision is covered with Honeycomb dressing and Steri  Strips; Dressing is clean, dry, and intact   Disposition: Discharge disposition: 01-Home or Self Care       Discharge Instructions     Call MD for:  persistant nausea and vomiting   Complete by: As directed    Call MD for:  redness, tenderness, or signs of infection (pain, swelling, redness, odor or green/yellow discharge around incision site)   Complete by: As directed    Call MD for:  severe uncontrolled pain   Complete by: As directed    Call MD for:  temperature >100.4   Complete by: As directed    Diet - low sodium heart healthy   Complete by: As directed    Increase activity slowly   Complete by: As directed    No wound care   Complete by: As directed    Remove dressing in 24 hours   Complete by: As directed       Allergies as of 06/05/2022       Reactions   Bee Venom Anaphylaxis   Penicillins Anaphylaxis, Other (See Comments)   Has patient had a PCN reaction causing immediate rash, facial/tongue/throat swelling, SOB or lightheadedness with hypotension: Yes Has patient had a PCN reaction causing severe rash involving mucus membranes or skin necrosis: No Has patient had a PCN reaction that required hospitalization: Yes Has patient had a PCN reaction occurring within the last 10 years: No If all of the above answers are "NO", then may proceed with Cephalosporin use.   Mushroom Extract Complex Nausea And Vomiting   Ciprofloxacin Rash   Powder Itching, Rash, Other (See Comments)   The powder from latex gloves        Medication List  TAKE these medications    amLODipine 10 MG tablet Commonly known as: NORVASC Take 1 tablet (10 mg total) by mouth at bedtime.   cyclobenzaprine 10 MG tablet Commonly known as: FLEXERIL Take 1 tablet (10 mg total) by mouth 3 (three) times daily as needed for muscle spasms.   empagliflozin 10 MG Tabs tablet Commonly known as: JARDIANCE Take 1 tablet (10 mg total) by mouth daily.   FLUoxetine 40 MG capsule Commonly known  as: PROZAC Take 1 capsule (40 mg total) by mouth daily.   lidocaine 5 % Commonly known as: Lidoderm Place 1 patch onto the skin daily. Remove & Discard patch within 12 hours or as directed by MD   losartan-hydrochlorothiazide 100-12.5 MG tablet Commonly known as: HYZAAR Take 1 tablet by mouth daily.   SUMAtriptan 50 MG tablet Commonly known as: IMITREX Take 1 tablet (50 mg total) by mouth every 2 (two) hours as needed for migraine. May repeat in 2 hours if headache persists or recurs.        Follow-up Information     Kary Kos, MD. Go on 06/17/2022.   Specialty: Neurosurgery Why: First post op appointment is on 06/17/2022 at 3 pm. Contact information: [address] [place] [address] [phone]                 Signed: Viona Gilmore, DNP, AGNP-C Nurse Practitioner  [facility] [place] [address], [place], [place] [address] P: [phone]    F: [phone]  06/05/2022, 9:31 AM

## 2022-06-05 NOTE — Progress Notes (Signed)
Patient alert and oriented, voiding adequately, skin clean, dry and intact without evidence of skin break down, or symptoms of complications - no redness or edema noted, only slight tenderness at site.  Patient states pain is manageable at time of discharge. Patient has an appointment with MD in 2 weeks 

## 2022-06-05 NOTE — Progress Notes (Signed)
Physical Therapy Treatment Patient Details Name: [name] MRN: [id number] DOB: Jan 05, 1981 Today's Date: 06/05/2022   History of Present Illness Pt is a 41 y/o female who presents s/p redo laminectomy/microdiscectomy L5-S1 on 06/04/2022. PMH significant for R knee medial meniscus tear, DM II, HTN, prior back surgery.    PT Comments    Patient able to mobilize better today without reports of dizziness or nausea. Gait training with DME and stair training completed without difficulty. Patient mobilizing well enough for discharge to home today if MD desires.     Recommendations for follow up therapy are one component of a multi-disciplinary discharge planning process, led by the attending physician.  Recommendations may be updated based on patient status, additional functional criteria and insurance authorization.  Follow Up Recommendations  No PT follow up     Assistance Recommended at Discharge Intermittent Supervision/Assistance  Patient can return home with the following A little help with walking and/or transfers;A little help with bathing/dressing/bathroom;Assistance with cooking/housework;Assist for transportation;Help with stairs or ramp for entrance   Equipment Recommendations  None recommended by PT (pt educated she may have to pay for RW out of pocket due to purchase of rollator by insurance within the past 5 years; she did not want RW ordered)    Recommendations for Other Services       Precautions / Restrictions Precautions Precautions: Fall;Back Precaution Booklet Issued: No (provided 8/4) Precaution Comments: pt able to state 3/3 precautions and demonstrated adherence during mobility Required Braces or Orthoses:  (No brace needed order) Restrictions Weight Bearing Restrictions: No     Mobility  Bed Mobility Overal bed mobility: Modified Independent Bed Mobility: Sidelying to Sit, Sit to Sidelying, Rolling           General bed mobility comments: HOB  slightly elevated and min use of rails.    Transfers Overall transfer level: Needs assistance Equipment used: Rolling walker (2 wheels) Transfers: Sit to/from Stand Sit to Stand: Supervision           General transfer comment: cues for hand placement    Ambulation/Gait Ambulation/Gait assistance: Min guard Gait Distance (Feet): 120 Feet Assistive device: Rolling walker (2 wheels) Gait Pattern/deviations: Step-to pattern, Decreased stride length, Trunk flexed Gait velocity: Decreased     General Gait Details: initial cues for upright posture, which pt then maintained   Stairs Stairs: Yes Stairs assistance: Min assist Stair Management: One rail Left, Step to pattern, Forwards Number of Stairs: 2 General stair comments: HHA on right, rail on LEft (ascending)   Wheelchair Mobility    Modified Rankin (Stroke Patients Only)       Balance Overall balance assessment: Mild deficits observed, not formally tested                                          Cognition Arousal/Alertness: Awake/alert Behavior During Therapy: Flat affect Overall Cognitive Status: Within Functional Limits for tasks assessed                                          Exercises      General Comments        Pertinent Vitals/Pain Pain Assessment Pain Assessment: 0-10 Pain Score: 10-Worst pain ever Pain Location: back Pain Descriptors / Indicators: Operative site guarding, Sore Pain Intervention(s):  Limited activity within patient's tolerance, Monitored during session, Premedicated before session    [place] expects to be discharged to:: Private residence Living Arrangements: Spouse/significant other;Children Available Help at Discharge: Family Type of Home: House Home Access: Stairs to enter Entrance Stairs-Rails: None Entrance Stairs-Number of Steps: 2   Home Layout: One level Home Equipment: Conservation officer, nature (2 wheels);Cane -  single point      Prior Function            PT Goals (current goals can now be found in the care plan section) Acute Rehab PT Goals Patient Stated Goal: go home today Time For Goal Achievement: 06/11/22 Potential to Achieve Goals: Good Progress towards PT goals: Progressing toward goals    Frequency    Min 5X/week      PT Plan Current plan remains appropriate    Co-evaluation              AM-PAC PT "6 Clicks" Mobility   Outcome Measure  Help needed turning from your back to your side while in a flat bed without using bedrails?: None Help needed moving from lying on your back to sitting on the side of a flat bed without using bedrails?: None Help needed moving to and from a bed to a chair (including a wheelchair)?: A Little Help needed standing up from a chair using your arms (e.g., wheelchair or bedside chair)?: A Little Help needed to walk in hospital room?: A Little Help needed climbing 3-5 steps with a railing? : A Little 6 Click Score: 20    End of Session Equipment Utilized During Treatment: Gait belt Activity Tolerance: Patient tolerated treatment well Patient left: in bed;with call bell/phone within reach (sitting EOB) Nurse Communication: Mobility status PT Visit Diagnosis: Unsteadiness on feet (R26.81);Pain Pain - part of body:  (back)     Time: 6010-9323 PT Time Calculation (min) (ACUTE ONLY): 13 min  Charges:  $Gait Training: 8-22 mins                      [place]  Office [phone]    Rexanne Mano 06/05/2022, 9:39 AM

## 2022-08-31 ENCOUNTER — Ambulatory Visit: Payer: Medicaid Other | Admitting: Podiatry

## 2022-09-09 ENCOUNTER — Ambulatory Visit: Payer: Medicaid Other | Admitting: Podiatry

## 2022-09-09 ENCOUNTER — Encounter: Payer: Self-pay | Admitting: Podiatry

## 2022-09-09 DIAGNOSIS — R202 Paresthesia of skin: Secondary | ICD-10-CM | POA: Insufficient documentation

## 2022-09-09 DIAGNOSIS — B351 Tinea unguium: Secondary | ICD-10-CM | POA: Diagnosis not present

## 2022-09-09 DIAGNOSIS — L600 Ingrowing nail: Secondary | ICD-10-CM

## 2022-09-09 DIAGNOSIS — E1142 Type 2 diabetes mellitus with diabetic polyneuropathy: Secondary | ICD-10-CM

## 2022-09-09 DIAGNOSIS — M79674 Pain in right toe(s): Secondary | ICD-10-CM | POA: Diagnosis not present

## 2022-09-09 DIAGNOSIS — M79675 Pain in left toe(s): Secondary | ICD-10-CM | POA: Diagnosis not present

## 2022-09-09 DIAGNOSIS — R200 Anesthesia of skin: Secondary | ICD-10-CM | POA: Insufficient documentation

## 2022-09-09 NOTE — Progress Notes (Signed)
  Subjective:  Patient ID: [name], female    DOB: [birth year],  MRN: [id number]  Chief Complaint  Patient presents with   Diabetes    np diabetic foot care** pt aware they will be transfered to [facility] or [facility] for future appts/ history of ingrowns   Peripheral Neuropathy    Patient has had several back surgeries, she has numbness and pain     41 y.o. female presents with the above complaint. History confirmed with patient.  All the nails are thickened and elongated and causing discomfort and pain.  She has a history of chronic recurrent ingrown's on both great toenails.  She has peripheral neuropathy from her diabetes as well as multiple spine surgeries.  She gets numbness and burning and tingling.  Objective:  Physical Exam: warm, good capillary refill, no trophic changes or ulcerative lesions, normal DP and PT pulses, and abnormal sensory exam with loss of protective sensation.  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 Pain due to onychomycosis of toenails of both feet   2. Ingrowing left great toenail   3. Ingrowing right great toenail   4. Controlled type 2 diabetes mellitus with diabetic polyneuropathy, without long-term current use of insulin ([place])      Plan:  Patient was evaluated and treated and all questions answered.  Patient educated on diabetes. Discussed proper diabetic foot care and discussed risks and complications of disease. Educated patient in depth on reasons to return to the office immediately should he/she discover anything concerning or new on the feet. All questions answered. Discussed proper shoes as well.   Discussed the etiology and treatment options for the condition in detail with the patient. Educated patient on the topical and oral treatment options for mycotic nails.  Recommended debridement of the nails today. Sharp and mechanical debridement performed of all painful and mycotic nails today. Nails debrided in length and thickness using a nail nipper to level of comfort. Discussed treatment options including appropriate shoe gear. Follow up as needed for painful nails.  We discussed permanent treatment of her ingrown nails with matricectomy.  Her A1c has increased above 7.5% I recommended that after her next recheck if it is lower she should return to see me for partial permanent matricectomy's.  Return in about 3 months (around 12/10/2022) for at risk diabetic foot care.

## 2022-11-12 ENCOUNTER — Other Ambulatory Visit: Payer: Self-pay

## 2022-11-12 ENCOUNTER — Encounter (HOSPITAL_BASED_OUTPATIENT_CLINIC_OR_DEPARTMENT_OTHER): Payer: Self-pay | Admitting: Orthopaedic Surgery

## 2022-11-16 NOTE — H&P (View-Only) (Signed)
PREOPERATIVE H&P  Chief Complaint: RIGHT KNEE VILLONODULAR SYNOVITIS  HPI: [name] is a 42 y.o. female who is scheduled for, Procedure(s): ARTHROSCOPY KNEE WITH SYNOVECTOMY AND CHONDROPLASTY.   Patient has a past medical history significant for DM type 2, GERD, MRSA infection, HTN.  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  She has had continued symptoms. She now feels her knee is just as bad as before the surgery. She did have temporary improvement after the scope  Symptoms are rated as moderate to severe, and have been worsening.  This is significantly impairing activities of daily living.    Please see clinic note for further details on this patient's care.    She has elected for surgical management.   Past Medical History:  Diagnosis Date   Abdominal lipoma 05/2015   Abdominal pain, RLQ 10/03/2017   Acute medial meniscus tear of right knee 06/09/2019   Anxiety    Arthritis    lower back   Depression 2019   DM type 2 with diabetic peripheral neuropathy (HCC)    Borderline   GERD (gastroesophageal reflux disease)    Headache    Migraines   History of MRSA infection ~ 2006   leg   History of shingles    Hx of hydronephrosis    following total hysterectomy surgery ; Right kidney ; see CT abdomen 10-02-17 epic; states " a plug had got damaged during the surgery so thats why they put the stent in"    Hypertension    under control with med., has been on med. x 6 mos.   Lumbosacral spondylolysis    see MRI 06-01-17 epic    Mood swing    Optic nerve swelling    seen by Dr Mora Bellman 09-07-17, did lumbar puncture 09-19-17; patient states " they never called me about results ut i have an appt with him in april"; patient also states  " my vision is still blurry and worse when i get the migraines, they think the migraines are causing it"   Psoriasis    Past Surgical History:  Procedure Laterality Date   CESAREAN SECTION   06/21/2008; 08/18/2010   CESAREAN SECTION N/A 07/10/2014   Procedure: REPEAT CESAREAN SECTION;  Surgeon: Daria Pastures, MD;  Location: [facility];  Service: Obstetrics;  Laterality: N/A;   CHOLECYSTECTOMY  07/10/2008   CYSTOSCOPY N/A 09/28/2017   Procedure: CYSTOSCOPY;  Surgeon: Bobbye Charleston, MD;  Location: [facility];  Service: Gynecology;  Laterality: N/A;   CYSTOSCOPY Right 01/18/2018   Procedure: CYSTOSCOPY, RETROGRADE / PYELOGRAM, URETEROSCOPY;  Surgeon: Ceasar Mons, MD;  Location: [facility];  Service: Urology;  Laterality: Right;   CYSTOSCOPY W/ URETERAL STENT PLACEMENT Right 10/03/2017   Procedure: CYSTOSCOPY WITH RETROGRADE PYELOGRAM/URETERAL STENT PLACEMENT;  Surgeon: Ceasar Mons, MD;  Location: [facility];  Service: Urology;  Laterality: Right;   CYSTOSCOPY W/ URETERAL STENT PLACEMENT Right 02/22/2018   Procedure: CYSTOSCOPY WITH URETERAL STENT PLACEMENT;  Surgeon: Ceasar Mons, MD;  Location: [facility];  Service: Urology;  Laterality: Right;   KNEE ARTHROSCOPY Right 09/17/2021   Procedure: ARTHROSCOPY KNEE WITH SYNOVECTOMY with partial menisectomy;  Surgeon: Hiram Gash, MD;  Location: [facility];  Service: Orthopedics;  Laterality: Right;   KNEE ARTHROSCOPY WITH MEDIAL MENISECTOMY Right 06/26/2019   Procedure: KNEE ARTHROSCOPY WITH PARTIAL MEDIAL MENISECTOMY, REMOVAL LOOSE BODY;  Surgeon: Elsie Saas, MD;  Location: [facility];  Service:  Orthopedics;  Laterality: Right;   LIPOMA EXCISION  2014   back   LIPOMA EXCISION N/A 05/21/2015   Procedure: EXCISION LIPOMAS ON ABDOMEN;  Surgeon: Erroll Luna, MD;  Location: [facility];  Service: General;  Laterality: N/A;   LUMBAR LAMINECTOMY/DECOMPRESSION MICRODISCECTOMY Right 01/23/2020   Procedure: Microdiscectomy right - Lumbar five-Sacral one;  Surgeon: Kary Kos, MD;  Location: [facility];  Service: Neurosurgery;  Laterality: Right;   LUMBAR  LAMINECTOMY/DECOMPRESSION MICRODISCECTOMY Right 01/01/2022   Procedure: Microdiscectomy Right Lumbar Five - Sacral One Redo;  Surgeon: Kary Kos, MD;  Location: [facility] [place] [facility];  Service: Neurosurgery;  Laterality: Right;   LUMBAR LAMINECTOMY/DECOMPRESSION MICRODISCECTOMY Right 06/04/2022   Procedure: Microdiscectomy - right - Lumbar five-Sacral one re-exploration for recurrent disc;  Surgeon: Kary Kos, MD;  Location: [facility];  Service: Neurosurgery;  Laterality: Right;  3C   LUMBAR PUNCTURE  09/19/17   [facility], Dr. Curt Bears   OOPHORECTOMY Left 09/28/2017   Procedure: OOPHORECTOMY;  Surgeon: Bobbye Charleston, MD;  Location: [facility];  Service: Gynecology;  Laterality: Left;   ROBOTIC ASSISTED LAPAROSCOPIC LYSIS OF ADHESION N/A 09/28/2017   Procedure: ROBOTIC ASSISTED LAPAROSCOPIC LYSIS OF ADHESION;  Surgeon: Bobbye Charleston, MD;  Location: [facility];  Service: Gynecology;  Laterality: N/A;   ROBOTIC ASSISTED TOTAL HYSTERECTOMY WITH BILATERAL SALPINGO OOPHERECTOMY Bilateral 09/28/2017   Procedure: ROBOTIC ASSISTED TOTAL HYSTERECTOMY WITH BILATERAL SALPINGECTOMY;  Surgeon: Bobbye Charleston, MD;  Location: [facility];  Service: Gynecology;  Laterality: Bilateral;   ROBOTICALLY ASSISTED LAPAROSCOPIC URETERAL RE-IMPLANTATION Right 02/22/2018   Procedure: ROBOTICALLY ASSISTED LAPAROSCOPIC URETERAL RE-IMPLANTATION;  Surgeon: Ceasar Mons, MD;  Location: [facility];  Service: Urology;  Laterality: Right;  ONLY NEEDS 240 MIN TOTAL FOR ALL PROCEDURES   TUBAL LIGATION     WISDOM TOOTH EXTRACTION     Social History   Socioeconomic History   Marital status: Married    Spouse name: Dominica Severin   Number of children: 3   Years of education: Not on file   Highest education level: Not on file  Occupational History   Not on file  Tobacco Use   Smoking status: Some Days    Packs/day: 0.50    Years: 16.00    Total pack years: 8.00    Types: Cigarettes    Start date: 05/01/2014   Smokeless tobacco: Never   Vaping Use   Vaping Use: Never used  Substance and Sexual Activity   Alcohol use: No    Alcohol/week: 0.0 standard drinks of alcohol   Drug use: No   Sexual activity: Yes    Birth control/protection: None  Other Topics Concern   Not on file  Social History Narrative   Live home with husband and 3 kids.   Education HS grad.  Culinary grad.  Caffeine  8 glasses (sweet tea), some soda's occ.  Working: [employer].     Social Determinants of Health   Financial Resource Strain: Not on file  Food Insecurity: Not on file  Transportation Needs: Not on file  Physical Activity: Not on file  Stress: Not on file  Social Connections: Not on file   Family History  Problem Relation Age of Onset   Drug abuse Mother    Heart disease Maternal Grandmother    Heart disease Maternal Grandfather    Allergies  Allergen Reactions   Bee Venom Anaphylaxis   Penicillins Anaphylaxis and Other (See Comments)    Has patient had a PCN reaction causing immediate rash, facial/tongue/throat swelling, SOB or lightheadedness with  hypotension: Yes Has patient had a PCN reaction causing severe rash involving mucus membranes or skin necrosis: No Has patient had a PCN reaction that required hospitalization: Yes Has patient had a PCN reaction occurring within the last 10 years: No If all of the above answers are "NO", then may proceed with Cephalosporin use.    Mushroom Extract Complex Nausea And Vomiting   Ciprofloxacin Rash   Powder Itching, Rash and Other (See Comments)    The powder from latex gloves   Prior to Admission medications   Medication Sig Start Date End Date Taking? Authorizing Provider  empagliflozin (JARDIANCE) 10 MG TABS tablet Take 1 tablet (10 mg total) by mouth daily. 04/15/20  Yes Nuala Alpha, MD  FLUoxetine (PROZAC) 40 MG capsule Take 1 capsule (40 mg total) by mouth daily. 01/10/22  Yes Dameron, Luna Fuse, DO  losartan-hydrochlorothiazide (HYZAAR) 100-12.5 MG tablet Take 1 tablet by mouth  daily. 11/03/21  Yes Chambliss, Jeb Levering, MD  rosuvastatin (CRESTOR) 10 MG tablet Take 10 mg by mouth daily. 09/02/22  Yes [provider]  SUMAtriptan (IMITREX) 50 MG tablet Take 1 tablet (50 mg total) by mouth every 2 (two) hours as needed for migraine. May repeat in 2 hours if headache persists or recurs. 12/12/20  Yes Maness, Arnette Norris, MD  cyclobenzaprine (FLEXERIL) 10 MG tablet Take 1 tablet (10 mg total) by mouth 3 (three) times daily as needed for muscle spasms. 01/02/22   Kristeen Miss, MD  lidocaine (LIDODERM) 5 % Place 1 patch onto the skin daily. Remove & Discard patch within 12 hours or as directed by MD Patient not taking: Reported on 12/17/2021 07/05/21   Wynona Dove A, DO  nicotine (NICODERM CQ - DOSED IN MG/24 HOURS) 21 mg/24hr patch 21 mg daily. 08/17/22   [provider]  nicotine polacrilex (NICORETTE) 2 MG gum SMARTSIG:1 gum By Mouth Every 2 Hours PRN 08/18/22   [provider]    ROS: All other systems have been reviewed and were otherwise negative with the exception of those mentioned in the HPI and as above.  Physical Exam: General: Alert, no acute distress Cardiovascular: No pedal edema Respiratory: No cyanosis, no use of accessory musculature GI: No organomegaly, abdomen is soft and non-tender Skin: No lesions in the area of chief complaint Neurologic: Sensation intact distally Psychiatric: Patient is competent for consent with normal mood and affect Lymphatic: No axillary or cervical lymphadenopathy  MUSCULOSKELETAL:  On examination the range of motion of the knee is 0-105 degrees, Limited by pain. Large effusion.   Imaging: MRI is reviewed and demonstrates a large pigmented villonodular synovitis reaccumulation diffusely through the joint.  Assessment: RIGHT KNEE VILLONODULAR SYNOVITIS  Plan: Plan for Procedure(s): ARTHROSCOPY KNEE WITH SYNOVECTOMY AND CHONDROPLASTY  Dr. Griffin Basil and the patient discussed she may end up needing to talk to  one of the orthopedic oncologist at a tertiary care center about her options.  The only option we have to recommend to her is a recurrent synovectomy which she wants to proceed with, despite our recommendation to see someone at a tertiary care center. She understands the risk of reccurence. If it happens again we will refer her to a tertiary care center. She will see me back post operatively.  The risks benefits and alternatives were discussed with the patient including but not limited to the risks of nonoperative treatment, versus surgical intervention including infection, bleeding, nerve injury,  blood clots, cardiopulmonary complications, morbidity, mortality, among others, and they were willing to proceed.  The patient acknowledged the explanation, agreed to proceed with the plan and consent was signed.   Operative Plan: Right knee scope with extensive synovectomy  Discharge Medications: Standard - oxycodone as before DVT Prophylaxis: Aspirin Physical Therapy: Outpatient PT Special Discharge needs: +/-   Ethelda Chick, PA-C  11/16/2022 8:09 PM

## 2022-11-16 NOTE — H&P (Signed)
PREOPERATIVE H&P  Chief Complaint: RIGHT KNEE VILLONODULAR SYNOVITIS  HPI: [name] is a 42 y.o. female who is scheduled for, Procedure(s): ARTHROSCOPY KNEE WITH SYNOVECTOMY AND CHONDROPLASTY.   Patient has a past medical history significant for DM type 2, GERD, MRSA infection, HTN.  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  She has had continued symptoms. She now feels her knee is just as bad as before the surgery. She did have temporary improvement after the scope  Symptoms are rated as moderate to severe, and have been worsening.  This is significantly impairing activities of daily living.    Please see clinic note for further details on this patient's care.    She has elected for surgical management.   Past Medical History:  Diagnosis Date   Abdominal lipoma 05/2015   Abdominal pain, RLQ 10/03/2017   Acute medial meniscus tear of right knee 06/09/2019   Anxiety    Arthritis    lower back   Depression 2019   DM type 2 with diabetic peripheral neuropathy (HCC)    Borderline   GERD (gastroesophageal reflux disease)    Headache    Migraines   History of MRSA infection ~ 2006   leg   History of shingles    Hx of hydronephrosis    following total hysterectomy surgery ; Right kidney ; see CT abdomen 10-02-17 epic; states " a plug had got damaged during the surgery so thats why they put the stent in"    Hypertension    under control with med., has been on med. x 6 mos.   Lumbosacral spondylolysis    see MRI 06-01-17 epic    Mood swing    Optic nerve swelling    seen by Dr Mora Bellman 09-07-17, did lumbar puncture 09-19-17; patient states " they never called me about results ut i have an appt with him in april"; patient also states  " my vision is still blurry and worse when i get the migraines, they think the migraines are causing it"   Psoriasis    Past Surgical History:  Procedure Laterality Date   CESAREAN SECTION   06/21/2008; 08/18/2010   CESAREAN SECTION N/A 07/10/2014   Procedure: REPEAT CESAREAN SECTION;  Surgeon: Daria Pastures, MD;  Location: [facility];  Service: Obstetrics;  Laterality: N/A;   CHOLECYSTECTOMY  07/10/2008   CYSTOSCOPY N/A 09/28/2017   Procedure: CYSTOSCOPY;  Surgeon: Bobbye Charleston, MD;  Location: [facility];  Service: Gynecology;  Laterality: N/A;   CYSTOSCOPY Right 01/18/2018   Procedure: CYSTOSCOPY, RETROGRADE / PYELOGRAM, URETEROSCOPY;  Surgeon: Ceasar Mons, MD;  Location: [facility];  Service: Urology;  Laterality: Right;   CYSTOSCOPY W/ URETERAL STENT PLACEMENT Right 10/03/2017   Procedure: CYSTOSCOPY WITH RETROGRADE PYELOGRAM/URETERAL STENT PLACEMENT;  Surgeon: Ceasar Mons, MD;  Location: [facility];  Service: Urology;  Laterality: Right;   CYSTOSCOPY W/ URETERAL STENT PLACEMENT Right 02/22/2018   Procedure: CYSTOSCOPY WITH URETERAL STENT PLACEMENT;  Surgeon: Ceasar Mons, MD;  Location: [facility];  Service: Urology;  Laterality: Right;   KNEE ARTHROSCOPY Right 09/17/2021   Procedure: ARTHROSCOPY KNEE WITH SYNOVECTOMY with partial menisectomy;  Surgeon: Hiram Gash, MD;  Location: [facility];  Service: Orthopedics;  Laterality: Right;   KNEE ARTHROSCOPY WITH MEDIAL MENISECTOMY Right 06/26/2019   Procedure: KNEE ARTHROSCOPY WITH PARTIAL MEDIAL MENISECTOMY, REMOVAL LOOSE BODY;  Surgeon: Elsie Saas, MD;  Location: [facility];  Service:  Orthopedics;  Laterality: Right;   LIPOMA EXCISION  2014   back   LIPOMA EXCISION N/A 05/21/2015   Procedure: EXCISION LIPOMAS ON ABDOMEN;  Surgeon: Erroll Luna, MD;  Location: [facility];  Service: General;  Laterality: N/A;   LUMBAR LAMINECTOMY/DECOMPRESSION MICRODISCECTOMY Right 01/23/2020   Procedure: Microdiscectomy right - Lumbar five-Sacral one;  Surgeon: Kary Kos, MD;  Location: [facility];  Service: Neurosurgery;  Laterality: Right;   LUMBAR  LAMINECTOMY/DECOMPRESSION MICRODISCECTOMY Right 01/01/2022   Procedure: Microdiscectomy Right Lumbar Five - Sacral One Redo;  Surgeon: Kary Kos, MD;  Location: [facility];  Service: Neurosurgery;  Laterality: Right;   LUMBAR LAMINECTOMY/DECOMPRESSION MICRODISCECTOMY Right 06/04/2022   Procedure: Microdiscectomy - right - Lumbar five-Sacral one re-exploration for recurrent disc;  Surgeon: Kary Kos, MD;  Location: [facility];  Service: Neurosurgery;  Laterality: Right;  3C   LUMBAR PUNCTURE  09/19/17   [facility], Dr. Curt Bears   OOPHORECTOMY Left 09/28/2017   Procedure: OOPHORECTOMY;  Surgeon: Bobbye Charleston, MD;  Location: [facility];  Service: Gynecology;  Laterality: Left;   ROBOTIC ASSISTED LAPAROSCOPIC LYSIS OF ADHESION N/A 09/28/2017   Procedure: ROBOTIC ASSISTED LAPAROSCOPIC LYSIS OF ADHESION;  Surgeon: Bobbye Charleston, MD;  Location: [facility];  Service: Gynecology;  Laterality: N/A;   ROBOTIC ASSISTED TOTAL HYSTERECTOMY WITH BILATERAL SALPINGO OOPHERECTOMY Bilateral 09/28/2017   Procedure: ROBOTIC ASSISTED TOTAL HYSTERECTOMY WITH BILATERAL SALPINGECTOMY;  Surgeon: Bobbye Charleston, MD;  Location: [facility];  Service: Gynecology;  Laterality: Bilateral;   ROBOTICALLY ASSISTED LAPAROSCOPIC URETERAL RE-IMPLANTATION Right 02/22/2018   Procedure: ROBOTICALLY ASSISTED LAPAROSCOPIC URETERAL RE-IMPLANTATION;  Surgeon: Ceasar Mons, MD;  Location: [facility];  Service: Urology;  Laterality: Right;  ONLY NEEDS 240 MIN TOTAL FOR ALL PROCEDURES   TUBAL LIGATION     WISDOM TOOTH EXTRACTION     Social History   Socioeconomic History   Marital status: Married    Spouse name: Dominica Severin   Number of children: 3   Years of education: Not on file   Highest education level: Not on file  Occupational History   Not on file  Tobacco Use   Smoking status: Some Days    Packs/day: 0.50    Years: 16.00    Total pack years: 8.00    Types: Cigarettes    Start date: 05/01/2014   Smokeless tobacco: Never   Vaping Use   Vaping Use: Never used  Substance and Sexual Activity   Alcohol use: No    Alcohol/week: 0.0 standard drinks of alcohol   Drug use: No   Sexual activity: Yes    Birth control/protection: None  Other Topics Concern   Not on file  Social History Narrative   Live home with husband and 3 kids.   Education HS grad.  Culinary grad.  Caffeine  8 glasses (sweet tea), some soda's occ.  Working: [employer].     Social Determinants of Health   Financial Resource Strain: Not on file  Food Insecurity: Not on file  Transportation Needs: Not on file  Physical Activity: Not on file  Stress: Not on file  Social Connections: Not on file   Family History  Problem Relation Age of Onset   Drug abuse Mother    Heart disease Maternal Grandmother    Heart disease Maternal Grandfather    Allergies  Allergen Reactions   Bee Venom Anaphylaxis   Penicillins Anaphylaxis and Other (See Comments)    Has patient had a PCN reaction causing immediate rash, facial/tongue/throat swelling, SOB or lightheadedness with  hypotension: Yes Has patient had a PCN reaction causing severe rash involving mucus membranes or skin necrosis: No Has patient had a PCN reaction that required hospitalization: Yes Has patient had a PCN reaction occurring within the last 10 years: No If all of the above answers are "NO", then may proceed with Cephalosporin use.    Mushroom Extract Complex Nausea And Vomiting   Ciprofloxacin Rash   Powder Itching, Rash and Other (See Comments)    The powder from latex gloves   Prior to Admission medications   Medication Sig Start Date End Date Taking? Authorizing Provider  empagliflozin (JARDIANCE) 10 MG TABS tablet Take 1 tablet (10 mg total) by mouth daily. 04/15/20  Yes Nuala Alpha, MD  FLUoxetine (PROZAC) 40 MG capsule Take 1 capsule (40 mg total) by mouth daily. 01/10/22  Yes Dameron, Luna Fuse, DO  losartan-hydrochlorothiazide (HYZAAR) 100-12.5 MG tablet Take 1 tablet by mouth  daily. 11/03/21  Yes Chambliss, Jeb Levering, MD  rosuvastatin (CRESTOR) 10 MG tablet Take 10 mg by mouth daily. 09/02/22  Yes [provider]  SUMAtriptan (IMITREX) 50 MG tablet Take 1 tablet (50 mg total) by mouth every 2 (two) hours as needed for migraine. May repeat in 2 hours if headache persists or recurs. 12/12/20  Yes Maness, Arnette Norris, MD  cyclobenzaprine (FLEXERIL) 10 MG tablet Take 1 tablet (10 mg total) by mouth 3 (three) times daily as needed for muscle spasms. 01/02/22   Kristeen Miss, MD  lidocaine (LIDODERM) 5 % Place 1 patch onto the skin daily. Remove & Discard patch within 12 hours or as directed by MD Patient not taking: Reported on 12/17/2021 07/05/21   Wynona Dove A, DO  nicotine (NICODERM CQ - DOSED IN MG/24 HOURS) 21 mg/24hr patch 21 mg daily. 08/17/22   [provider]  nicotine polacrilex (NICORETTE) 2 MG gum SMARTSIG:1 gum By Mouth Every 2 Hours PRN 08/18/22   [provider]    ROS: All other systems have been reviewed and were otherwise negative with the exception of those mentioned in the HPI and as above.  Physical Exam: General: Alert, no acute distress Cardiovascular: No pedal edema Respiratory: No cyanosis, no use of accessory musculature GI: No organomegaly, abdomen is soft and non-tender Skin: No lesions in the area of chief complaint Neurologic: Sensation intact distally Psychiatric: Patient is competent for consent with normal mood and affect Lymphatic: No axillary or cervical lymphadenopathy  MUSCULOSKELETAL:  On examination the range of motion of the knee is 0-105 degrees, Limited by pain. Large effusion.   Imaging: MRI is reviewed and demonstrates a large pigmented villonodular synovitis reaccumulation diffusely through the joint.  Assessment: RIGHT KNEE VILLONODULAR SYNOVITIS  Plan: Plan for Procedure(s): ARTHROSCOPY KNEE WITH SYNOVECTOMY AND CHONDROPLASTY  Dr. Griffin Basil and the patient discussed she may end up needing to talk to  one of the orthopedic oncologist at a tertiary care center about her options.  The only option we have to recommend to her is a recurrent synovectomy which she wants to proceed with, despite our recommendation to see someone at a tertiary care center. She understands the risk of reccurence. If it happens again we will refer her to a tertiary care center. She will see me back post operatively.  The risks benefits and alternatives were discussed with the patient including but not limited to the risks of nonoperative treatment, versus surgical intervention including infection, bleeding, nerve injury,  blood clots, cardiopulmonary complications, morbidity, mortality, among others, and they were willing to proceed.  The patient acknowledged the explanation, agreed to proceed with the plan and consent was signed.   Operative Plan: Right knee scope with extensive synovectomy  Discharge Medications: Standard - oxycodone as before DVT Prophylaxis: Aspirin Physical Therapy: Outpatient PT Special Discharge needs: +/-   Ethelda Chick, PA-C  11/16/2022 8:09 PM

## 2022-11-17 ENCOUNTER — Encounter (HOSPITAL_COMMUNITY): Payer: Self-pay | Admitting: Certified Registered"

## 2022-11-17 ENCOUNTER — Encounter (HOSPITAL_BASED_OUTPATIENT_CLINIC_OR_DEPARTMENT_OTHER)
Admission: RE | Admit: 2022-11-17 | Discharge: 2022-11-17 | Disposition: A | Discharge status: Home / Self Care | Payer: Medicaid Other | Source: Ambulatory Visit | Attending: Orthopaedic Surgery | Admitting: Orthopaedic Surgery

## 2022-11-17 DIAGNOSIS — E119 Type 2 diabetes mellitus without complications: Secondary | ICD-10-CM | POA: Insufficient documentation

## 2022-11-17 DIAGNOSIS — Z01818 Encounter for other preprocedural examination: Secondary | ICD-10-CM | POA: Diagnosis not present

## 2022-11-17 LAB — BASIC METABOLIC PANEL
Anion gap: 9 (ref 5–15)
BUN: 8 mg/dL (ref 6–20)
CO2: 27 mmol/L (ref 22–32)
Calcium: 9.3 mg/dL (ref 8.9–10.3)
Chloride: 103 mmol/L (ref 98–111)
Creatinine, Ser: 0.61 mg/dL (ref 0.44–1.00)
GFR, Estimated: >60 mL/min (ref >60)
Glucose, Bld: 89 mg/dL (ref 70–99)
Potassium: 4.1 mmol/L (ref 3.5–5.1)
Sodium: 139 mmol/L (ref 135–145)

## 2022-11-17 NOTE — Progress Notes (Signed)
Left pt. A reminder text to come in today for labs and EKG needed prior to surgery tomorrow.

## 2022-11-17 NOTE — Discharge Instructions (Signed)
Ophelia Charter MD, MPH Noemi Chapel, PA-C [facility] [address], [address] [phone] (tel)   [phone] (fax)   POST-OPERATIVE INSTRUCTIONS - Knee Arthroscopy  WOUND CARE - You may remove the Operative Dressing on Post-Op Day #3 (72hrs after surgery).   -  Alternatively if you would like you can leave dressing on until follow-up if within 7-8 days but keep it dry. - Leave steri-strips in place until they fall off on their own, usually 2 weeks postop. - An ACE wrap may be used to control swelling, do not wrap this too tight.  If the initial ACE wrap feels too tight you may loosen it. - There may be a small amount of fluid/bleeding leaking at the surgical site.  - This is normal; the knee is filled with fluid during the procedure and can leak for 24-48hrs after surgery. You may change/reinforce the bandage as needed.  - Use the Cryocuff or Ice as often as possible for the first 7 days, then as needed for pain relief. Always keep a towel, ACE wrap or other barrier between the cooling unit and your skin.  - You may shower on Post-Op Day #3. Gently pat the area dry.  - Do not soak the knee in water or submerge it.  - Do not go swimming in the pool or ocean until 4 weeks after surgery or when otherwise instructed.  Keep dry incisions as dry as possible.   BRACE/AMBULATION  -            You will not need a brace after this procedure.   - You may use crutches initially to help you weight bear, but this is not required - You can put full weight on your operative leg as you feel comfortable  PHYSICAL THERAPY - You will begin physical therapy soon after surgery (unless otherwise specified) - Please call to set up an appointment, if you do not already have one  - Let our office if there are any issues with scheduling your therapy   - A PT referral was sent to [facility] on [address] (NERVE BLOCKS) The anesthesia team may  have performed a nerve block for you this is a great tool used to minimize pain.   The block may start wearing off overnight (between 8-24 hours postop) When the block wears off, your pain may go from nearly zero to the pain you would have had postop without the block. This is an abrupt transition but nothing dangerous is happening.   This can be a challenging period but utilize your as needed pain medications to try and manage this period. We suggest you use the pain medication the first night prior to going to bed, to ease this transition.  You may take an extra dose of narcotic when this happens if needed   POST-OP MEDICATIONS- Multimodal approach to pain control In general your pain will be controlled with a combination of substances.  Prescriptions unless otherwise discussed are electronically sent to your pharmacy.  This is a carefully made plan we use to minimize narcotic use.     Meloxicam - Anti-inflammatory medication taken on a scheduled basis Acetaminophen - Non-narcotic pain medicine taken on a scheduled basis  Oxycodone - This is a strong narcotic, to be used only on an "as needed" basis for SEVERE pain. Aspirin '81mg'$  - This medicine is used to minimize the risk of blood clots after surgery. Zofran - take  as needed for nausea   FOLLOW-UP   Please call the office to schedule a follow-up appointment for your incision check, 7-10 days post-operatively.   IF YOU HAVE ANY QUESTIONS, PLEASE FEEL FREE TO CALL OUR OFFICE.   HELPFUL INFORMATION   Keep your leg elevated to decrease swelling, which will then in turn decrease your pain. I would elevate the foot of your bed by putting a couple of couch pillows between your mattress and box spring. I would not keep pillow directly under your ankle.  - Do not sleep with a pillow behind your knee even if it is more comfortable as this may make it harder to get your knee fully straight long term.   There will be MORE swelling on days  1-3 than there is on the day of surgery.  This also is normal. The swelling will decrease with the anti-inflammatory medication, ice and keeping it elevated. The swelling will make it more difficult to bend your knee. As the swelling goes down your motion will become easier   You may develop swelling and bruising that extends from your knee down to your calf and perhaps even to your foot over the next week. Do not be alarmed. This too is normal, and it is due to gravity   There may be some numbness adjacent to the incision site. This may last for 6-12 months or longer in some patients and is expected.   You may return to sedentary work/school in the next couple of days when you feel up to it. You will need to keep your leg elevated as much as possible    You should wean off your narcotic medicines as soon as you are able.  Most patients will be off or using minimal narcotics before their first postop appointment.    We suggest you use the pain medication the first night prior to going to bed, in order to ease any pain when the anesthesia wears off. You should avoid taking pain medications on an empty stomach as it will make you nauseous.   Do not drink alcoholic beverages or take illicit drugs when taking pain medications.   It is against the law to drive while taking narcotics. You cannot drive if your Right leg is in brace locked in extension.   Pain medication may make you constipated.  Below are a few solutions to try in this order:  o Decrease the amount of pain medication if you aren't having pain.  o Drink lots of decaffeinated fluids.  o Drink prune juice and/or eat dried prunes   o If the first 3 don't work start with additional solutions  o Take Colace - an over-the-counter stool softener  o Take Senokot - an over-the-counter laxative  o Take Miralax - a stronger over-the-counter laxative    For more information including helpful videos and documents visit our website:    [email or web address]

## 2022-11-18 ENCOUNTER — Ambulatory Visit (HOSPITAL_BASED_OUTPATIENT_CLINIC_OR_DEPARTMENT_OTHER): Admission: RE | Admit: 2022-11-18 | Payer: Medicaid Other | Source: Home / Self Care | Admitting: Orthopaedic Surgery

## 2022-11-18 DIAGNOSIS — E119 Type 2 diabetes mellitus without complications: Secondary | ICD-10-CM

## 2022-11-18 SURGERY — ARTHROSCOPY, KNEE
Anesthesia: General | Site: Knee | Laterality: Right

## 2022-11-19 ENCOUNTER — Other Ambulatory Visit: Payer: Self-pay

## 2022-11-25 ENCOUNTER — Ambulatory Visit (HOSPITAL_BASED_OUTPATIENT_CLINIC_OR_DEPARTMENT_OTHER)
Admission: RE | Admit: 2022-11-25 | Discharge: 2022-11-25 | Disposition: A | Discharge status: Home / Self Care | Payer: Medicaid Other | Attending: Orthopaedic Surgery | Admitting: Orthopaedic Surgery

## 2022-11-25 ENCOUNTER — Ambulatory Visit (HOSPITAL_BASED_OUTPATIENT_CLINIC_OR_DEPARTMENT_OTHER): Payer: Medicaid Other | Admitting: Anesthesiology

## 2022-11-25 ENCOUNTER — Other Ambulatory Visit: Payer: Self-pay

## 2022-11-25 ENCOUNTER — Encounter (HOSPITAL_BASED_OUTPATIENT_CLINIC_OR_DEPARTMENT_OTHER)
Admission: RE | Disposition: A | Discharge status: Home / Self Care | Payer: Self-pay | Source: Home / Self Care | Attending: Orthopaedic Surgery

## 2022-11-25 ENCOUNTER — Encounter (HOSPITAL_BASED_OUTPATIENT_CLINIC_OR_DEPARTMENT_OTHER): Payer: Self-pay | Admitting: Orthopaedic Surgery

## 2022-11-25 DIAGNOSIS — Z6830 Body mass index (BMI) 30.0-30.9, adult: Secondary | ICD-10-CM | POA: Diagnosis not present

## 2022-11-25 DIAGNOSIS — Z79899 Other long term (current) drug therapy: Secondary | ICD-10-CM | POA: Diagnosis not present

## 2022-11-25 DIAGNOSIS — Z8614 Personal history of Methicillin resistant Staphylococcus aureus infection: Secondary | ICD-10-CM | POA: Diagnosis not present

## 2022-11-25 DIAGNOSIS — I10 Essential (primary) hypertension: Secondary | ICD-10-CM | POA: Diagnosis not present

## 2022-11-25 DIAGNOSIS — F32A Depression, unspecified: Secondary | ICD-10-CM | POA: Insufficient documentation

## 2022-11-25 DIAGNOSIS — F419 Anxiety disorder, unspecified: Secondary | ICD-10-CM | POA: Insufficient documentation

## 2022-11-25 DIAGNOSIS — G709 Myoneural disorder, unspecified: Secondary | ICD-10-CM | POA: Diagnosis not present

## 2022-11-25 DIAGNOSIS — M2241 Chondromalacia patellae, right knee: Secondary | ICD-10-CM | POA: Insufficient documentation

## 2022-11-25 DIAGNOSIS — E1142 Type 2 diabetes mellitus with diabetic polyneuropathy: Secondary | ICD-10-CM | POA: Insufficient documentation

## 2022-11-25 DIAGNOSIS — M12261 Villonodular synovitis (pigmented), right knee: Secondary | ICD-10-CM | POA: Insufficient documentation

## 2022-11-25 DIAGNOSIS — K219 Gastro-esophageal reflux disease without esophagitis: Secondary | ICD-10-CM | POA: Diagnosis not present

## 2022-11-25 DIAGNOSIS — F17200 Nicotine dependence, unspecified, uncomplicated: Secondary | ICD-10-CM | POA: Insufficient documentation

## 2022-11-25 DIAGNOSIS — Z7984 Long term (current) use of oral hypoglycemic drugs: Secondary | ICD-10-CM | POA: Diagnosis not present

## 2022-11-25 DIAGNOSIS — E669 Obesity, unspecified: Secondary | ICD-10-CM | POA: Insufficient documentation

## 2022-11-25 DIAGNOSIS — E119 Type 2 diabetes mellitus without complications: Secondary | ICD-10-CM

## 2022-11-25 HISTORY — PX: CHONDROPLASTY: SHX5177

## 2022-11-25 HISTORY — PX: KNEE ARTHROSCOPY WITH EXCISION PLICA: SHX5647

## 2022-11-25 LAB — GLUCOSE, CAPILLARY
Glucose-Capillary: 120 mg/dL — ABNORMAL HIGH (ref 70–99)
Glucose-Capillary: 120 mg/dL — ABNORMAL HIGH (ref 70–99)

## 2022-11-25 SURGERY — ARTHROSCOPY, KNEE, WITH PLICA EXCISION
Anesthesia: General | Site: Knee | Laterality: Right

## 2022-11-25 MED ORDER — ASPIRIN 81 MG PO CHEW: 81.0000 mg | CHEWABLE_TABLET | Freq: Two times a day (BID) | ORAL | 0 refills | Status: AC

## 2022-11-25 MED ORDER — FENTANYL CITRATE (PF) 100 MCG/2ML IJ SOLN
INTRAMUSCULAR | Status: AC
  Filled 2022-11-25: qty 2

## 2022-11-25 MED ORDER — OXYCODONE HCL 5 MG PO TABS: 5.0000 mg | ORAL_TABLET | Freq: Once | ORAL | Status: DC | PRN

## 2022-11-25 MED ORDER — KETOROLAC TROMETHAMINE 30 MG/ML IJ SOLN
30.0000 mg | Freq: Once | INTRAMUSCULAR | Status: AC | PRN
  Administered 2022-11-25: 30 mg via INTRAVENOUS

## 2022-11-25 MED ORDER — PROPOFOL 10 MG/ML IV BOLUS
INTRAVENOUS | Status: AC
  Filled 2022-11-25: qty 20

## 2022-11-25 MED ORDER — OXYCODONE HCL 5 MG/5ML PO SOLN: 5.0000 mg | Freq: Once | ORAL | Status: DC | PRN

## 2022-11-25 MED ORDER — LACTATED RINGERS IV SOLN: INTRAVENOUS | Status: DC

## 2022-11-25 MED ORDER — FENTANYL CITRATE (PF) 100 MCG/2ML IJ SOLN
25.0000 ug | INTRAMUSCULAR | Status: DC | PRN
  Administered 2022-11-25 (×3): 50 ug via INTRAVENOUS

## 2022-11-25 MED ORDER — ONDANSETRON HCL 4 MG/2ML IJ SOLN
INTRAMUSCULAR | Status: DC | PRN
  Administered 2022-11-25: 4 mg via INTRAVENOUS

## 2022-11-25 MED ORDER — ACETAMINOPHEN 500 MG PO TABS
ORAL_TABLET | ORAL | Status: AC
  Filled 2022-11-25: qty 2

## 2022-11-25 MED ORDER — CLINDAMYCIN PHOSPHATE 900 MG/50ML IV SOLN
INTRAVENOUS | Status: AC
  Filled 2022-11-25: qty 50

## 2022-11-25 MED ORDER — MIDAZOLAM HCL 5 MG/5ML IJ SOLN
INTRAMUSCULAR | Status: DC | PRN
  Administered 2022-11-25: 2 mg via INTRAVENOUS

## 2022-11-25 MED ORDER — SODIUM CHLORIDE 0.9 % IR SOLN
Status: DC | PRN
  Administered 2022-11-25: 6000 mL

## 2022-11-25 MED ORDER — KETOROLAC TROMETHAMINE 30 MG/ML IJ SOLN
INTRAMUSCULAR | Status: AC
  Filled 2022-11-25: qty 1

## 2022-11-25 MED ORDER — GABAPENTIN 300 MG PO CAPS
300.0000 mg | ORAL_CAPSULE | Freq: Once | ORAL | Status: AC
  Administered 2022-11-25: 300 mg via ORAL

## 2022-11-25 MED ORDER — LIDOCAINE 2% (20 MG/ML) 5 ML SYRINGE
INTRAMUSCULAR | Status: AC
  Filled 2022-11-25: qty 5

## 2022-11-25 MED ORDER — ACETAMINOPHEN 500 MG PO TABS
1000.0000 mg | ORAL_TABLET | Freq: Once | ORAL | Status: AC
  Administered 2022-11-25: 1000 mg via ORAL

## 2022-11-25 MED ORDER — DEXAMETHASONE SODIUM PHOSPHATE 10 MG/ML IJ SOLN
INTRAMUSCULAR | Status: AC
  Filled 2022-11-25: qty 1

## 2022-11-25 MED ORDER — ONDANSETRON HCL 4 MG/2ML IJ SOLN
INTRAMUSCULAR | Status: AC
  Filled 2022-11-25: qty 2

## 2022-11-25 MED ORDER — FENTANYL CITRATE (PF) 100 MCG/2ML IJ SOLN
INTRAMUSCULAR | Status: DC | PRN
  Administered 2022-11-25 (×2): 25 ug via INTRAVENOUS
  Administered 2022-11-25: 50 ug via INTRAVENOUS

## 2022-11-25 MED ORDER — HYDROMORPHONE HCL 1 MG/ML IJ SOLN
INTRAMUSCULAR | Status: AC
  Filled 2022-11-25: qty 0.5

## 2022-11-25 MED ORDER — HYDROMORPHONE HCL 1 MG/ML IJ SOLN
0.5000 mg | INTRAMUSCULAR | Status: AC | PRN
  Administered 2022-11-25 (×4): 0.5 mg via INTRAVENOUS

## 2022-11-25 MED ORDER — MIDAZOLAM HCL 2 MG/2ML IJ SOLN
INTRAMUSCULAR | Status: AC
  Filled 2022-11-25: qty 2

## 2022-11-25 MED ORDER — OXYCODONE HCL 5 MG PO TABS: ORAL_TABLET | ORAL | 0 refills | Status: AC

## 2022-11-25 MED ORDER — DEXAMETHASONE SODIUM PHOSPHATE 10 MG/ML IJ SOLN
INTRAMUSCULAR | Status: DC | PRN
  Administered 2022-11-25: 5 mg via INTRAVENOUS

## 2022-11-25 MED ORDER — PROPOFOL 10 MG/ML IV BOLUS
INTRAVENOUS | Status: DC | PRN
  Administered 2022-11-25: 200 mg via INTRAVENOUS

## 2022-11-25 MED ORDER — PROMETHAZINE HCL 25 MG/ML IJ SOLN: 6.2500 mg | INTRAMUSCULAR | Status: DC | PRN

## 2022-11-25 MED ORDER — CELECOXIB 100 MG PO CAPS: 100.0000 mg | ORAL_CAPSULE | Freq: Two times a day (BID) | ORAL | 0 refills | Status: AC

## 2022-11-25 MED ORDER — LIDOCAINE HCL (CARDIAC) PF 100 MG/5ML IV SOSY
PREFILLED_SYRINGE | INTRAVENOUS | Status: DC | PRN
  Administered 2022-11-25: 6 mg via INTRAVENOUS

## 2022-11-25 MED ORDER — ACETAMINOPHEN 500 MG PO TABS: 1000.0000 mg | ORAL_TABLET | Freq: Three times a day (TID) | ORAL | 0 refills | Status: AC

## 2022-11-25 MED ORDER — GABAPENTIN 300 MG PO CAPS
ORAL_CAPSULE | ORAL | Status: AC
  Filled 2022-11-25: qty 1

## 2022-11-25 MED ORDER — AMISULPRIDE (ANTIEMETIC) 5 MG/2ML IV SOLN: 10.0000 mg | Freq: Once | INTRAVENOUS | Status: DC | PRN

## 2022-11-25 MED ORDER — ONDANSETRON HCL 4 MG PO TABS: 4.0000 mg | ORAL_TABLET | Freq: Three times a day (TID) | ORAL | 0 refills | Status: AC | PRN

## 2022-11-25 MED ORDER — BUPIVACAINE HCL (PF) 0.25 % IJ SOLN
INTRAMUSCULAR | Status: DC | PRN
  Administered 2022-11-25: 20 mL

## 2022-11-25 MED ORDER — CLINDAMYCIN PHOSPHATE 900 MG/50ML IV SOLN
900.0000 mg | INTRAVENOUS | Status: AC
  Administered 2022-11-25: 900 mg via INTRAVENOUS

## 2022-11-25 SURGICAL SUPPLY — 32 items
APL PRP STRL LF DISP 70% ISPRP (MISCELLANEOUS) ×1
BANDAGE ESMARK 6X9 LF (GAUZE/BANDAGES/DRESSINGS) IMPLANT
BNDG CMPR 9X6 STRL LF SNTH (GAUZE/BANDAGES/DRESSINGS)
BNDG ELASTIC 6X5.8 VLCR STR LF (GAUZE/BANDAGES/DRESSINGS) ×1 IMPLANT
BNDG ESMARK 6X9 LF (GAUZE/BANDAGES/DRESSINGS)
CHLORAPREP W/TINT 26 (MISCELLANEOUS) ×1 IMPLANT
CLSR STERI-STRIP ANTIMIC 1/2X4 (GAUZE/BANDAGES/DRESSINGS) ×1 IMPLANT
CUFF TOURN SGL QUICK 34 (TOURNIQUET CUFF) ×1
CUFF TRNQT CYL 34X4.125X (TOURNIQUET CUFF) ×1 IMPLANT
DISSECTOR 4.0MMX13CM CVD (MISCELLANEOUS) ×1 IMPLANT
DRAPE ARTHROSCOPY W/POUCH 90 (DRAPES) ×1 IMPLANT
DRAPE IMP U-DRAPE 54X76 (DRAPES) ×1 IMPLANT
DRAPE U-SHAPE 47X51 STRL (DRAPES) ×1 IMPLANT
GAUZE SPONGE 4X4 12PLY STRL (GAUZE/BANDAGES/DRESSINGS) ×1 IMPLANT
GLOVE BIO SURGEON STRL SZ 6.5 (GLOVE) ×1 IMPLANT
GLOVE BIOGEL PI IND STRL 6.5 (GLOVE) ×1 IMPLANT
GLOVE BIOGEL PI IND STRL 8 (GLOVE) ×1 IMPLANT
GLOVE ECLIPSE 8.0 STRL XLNG CF (GLOVE) ×2 IMPLANT
GOWN STRL REUS W/ TWL LRG LVL3 (GOWN DISPOSABLE) ×1 IMPLANT
GOWN STRL REUS W/TWL LRG LVL3 (GOWN DISPOSABLE) ×1
GOWN STRL REUS W/TWL XL LVL3 (GOWN DISPOSABLE) ×1 IMPLANT
KIT TURNOVER KIT B (KITS) ×1 IMPLANT
MANIFOLD NEPTUNE II (INSTRUMENTS) IMPLANT
NS IRRIG 1000ML POUR BTL (IV SOLUTION) IMPLANT
PACK ARTHROSCOPY DSU (CUSTOM PROCEDURE TRAY) ×1 IMPLANT
PORT APPOLLO RF 90DEGREE MULTI (SURGICAL WAND) IMPLANT
SLEEVE SCD COMPRESS KNEE MED (STOCKING) ×1 IMPLANT
SUT MNCRL AB 4-0 PS2 18 (SUTURE) ×1 IMPLANT
TOWEL GREEN STERILE FF (TOWEL DISPOSABLE) ×1 IMPLANT
TUBE CONNECTING 20X1/4 (TUBING) ×1 IMPLANT
TUBING ARTHROSCOPY IRRIG 16FT (MISCELLANEOUS) ×1 IMPLANT
WATER STERILE IRR 1000ML POUR (IV SOLUTION) ×1 IMPLANT

## 2022-11-25 NOTE — Discharge Instructions (Addendum)
Ophelia Charter MD, MPH Noemi Chapel, PA-C [facility] [address] [phone] (tel)   [phone] (fax)   POST-OPERATIVE INSTRUCTIONS - Knee Arthroscopy  WOUND CARE - You may remove the Operative Dressing on Post-Op Day #3 (72hrs after surgery).   -  Alternatively if you would like you can leave dressing on until follow-up if within 7-8 days but keep it dry. - Leave steri-strips in place until they fall off on their own, usually 2 weeks postop. - An ACE wrap may be used to control swelling, do not wrap this too tight.  If the initial ACE wrap feels too tight you may loosen it. - There may be a small amount of fluid/bleeding leaking at the surgical site.  - This is normal; the knee is filled with fluid during the procedure and can leak for 24-48hrs after surgery. You may change/reinforce the bandage as needed.  - Use the Cryocuff or Ice as often as possible for the first 7 days, then as needed for pain relief. Always keep a towel, ACE wrap or other barrier between the cooling unit and your skin.  - You may shower on Post-Op Day #3. Gently pat the area dry.  - Do not soak the knee in water or submerge it.  - Do not go swimming in the pool or ocean until 4 weeks after surgery or when otherwise instructed.  Keep dry incisions as dry as possible.   BRACE/AMBULATION  -            You will not need a brace after this procedure.   - You may use crutches initially to help you weight bear, but this is not required - You can put full weight on your operative leg as you feel comfortable  PHYSICAL THERAPY - You will begin physical therapy soon after surgery (unless otherwise specified)  - A PT referral was sent to [facility] on [address] - Please call to set up an appointment, if you do not already have one   REGIONAL ANESTHESIA (NERVE BLOCKS) The anesthesia team may have performed a nerve block for you this is a great tool used to minimize pain.    The block may start wearing off overnight (between 8-24 hours postop) When the block wears off, your pain may go from nearly zero to the pain you would have had postop without the block. This is an abrupt transition but nothing dangerous is happening.   This can be a challenging period but utilize your as needed pain medications to try and manage this period. We suggest you use the pain medication the first night prior to going to bed, to ease this transition.  You may take an extra dose of narcotic when this happens if needed   POST-OP MEDICATIONS- Multimodal approach to pain control In general your pain will be controlled with a combination of substances.  Prescriptions unless otherwise discussed are electronically sent to your pharmacy.  This is a carefully made plan we use to minimize narcotic use.     Celebrex - Anti-inflammatory medication taken on a scheduled basis Acetaminophen - Non-narcotic pain medicine taken on a scheduled basis  Oxycodone - This is a strong narcotic, to be used only on an "as needed" basis for SEVERE pain. Aspirin '81mg'$  - This medicine is used to minimize the risk of blood clots after surgery. Zofran - take as needed for nausea    FOLLOW-UP   Please call the office to  schedule a follow-up appointment for your incision check, 7-10 days post-operatively.   IF YOU HAVE ANY QUESTIONS, PLEASE FEEL FREE TO CALL OUR OFFICE.   HELPFUL INFORMATION   Keep your leg elevated to decrease swelling, which will then in turn decrease your pain. I would elevate the foot of your bed by putting a couple of couch pillows between your mattress and box spring. I would not keep pillow directly under your ankle.  - Do not sleep with a pillow behind your knee even if it is more comfortable as this may make it harder to get your knee fully straight long term.   There will be MORE swelling on days 1-3 than there is on the day of surgery.  This also is normal. The swelling will  decrease with the anti-inflammatory medication, ice and keeping it elevated. The swelling will make it more difficult to bend your knee. As the swelling goes down your motion will become easier   You may develop swelling and bruising that extends from your knee down to your calf and perhaps even to your foot over the next week. Do not be alarmed. This too is normal, and it is due to gravity   There may be some numbness adjacent to the incision site. This may last for 6-12 months or longer in some patients and is expected.   You may return to sedentary work/school in the next couple of days when you feel up to it. You will need to keep your leg elevated as much as possible    You should wean off your narcotic medicines as soon as you are able.  Most patients will be off or using minimal narcotics before their first postop appointment.   - you should be off narcotics before your first post-operative visit    We suggest you use the pain medication the first night prior to going to bed, in order to ease any pain when the anesthesia wears off. You should avoid taking pain medications on an empty stomach as it will make you nauseous.   Do not drink alcoholic beverages or take illicit drugs when taking pain medications.   It is against the law to drive while taking narcotics. You cannot drive if your Right leg is in brace locked in extension.   Pain medication may make you constipated.  Below are a few solutions to try in this order:  o Decrease the amount of pain medication if you aren't having pain.  o Drink lots of decaffeinated fluids.  o Drink prune juice and/or eat dried prunes   o If the first 3 don't work start with additional solutions  o Take Colace - an over-the-counter stool softener  o Take Senokot - an over-the-counter laxative  o Take Miralax - a stronger over-the-counter laxative    For more information including helpful videos and documents visit our website:    [email or web address]   No Tylenol until 4:30pm today if needed  Post Anesthesia Home Care Instructions  Activity: Get plenty of rest for the remainder of the day. A responsible individual must stay with you for 24 hours following the procedure.  For the next 24 hours, DO NOT: -Drive a car -Paediatric nurse -Drink alcoholic beverages -Take any medication unless instructed by your physician -Make any legal decisions or sign important papers.  Meals: Start with liquid foods such as gelatin or soup. Progress to regular foods as tolerated. Avoid greasy, spicy, heavy foods. If nausea and/or vomiting occur, drink only clear  liquids until the nausea and/or vomiting subsides. Call your physician if vomiting continues.  Special Instructions/Symptoms: Your throat may feel dry or sore from the anesthesia or the breathing tube placed in your throat during surgery. If this causes discomfort, gargle with warm salt water. The discomfort should disappear within 24 hours.  If you had a scopolamine patch placed behind your ear for the management of post- operative nausea and/or vomiting:  1. The medication in the patch is effective for 72 hours, after which it should be removed.  Wrap patch in a tissue and discard in the trash. Wash hands thoroughly with soap and water. 2. You may remove the patch earlier than 72 hours if you experience unpleasant side effects which may include dry mouth, dizziness or visual disturbances. 3. Avoid touching the patch. Wash your hands with soap and water after contact with the patch.

## 2022-11-25 NOTE — Anesthesia Procedure Notes (Signed)
Procedure Name: LMA Insertion Date/Time: 11/25/2022 11:29 AM  Performed by: Lavonia Dana, CRNAPre-anesthesia Checklist: Patient identified, Emergency Drugs available, Suction available and Patient being monitored Patient Re-evaluated:Patient Re-evaluated prior to induction Oxygen Delivery Method: Circle system utilized Preoxygenation: Pre-oxygenation with 100% oxygen Induction Type: IV induction Ventilation: Mask ventilation without difficulty LMA: LMA inserted LMA Size: 4.0 Number of attempts: 1 Airway Equipment and Method: Bite block Placement Confirmation: positive ETCO2 Tube secured with: Tape Dental Injury: Teeth and Oropharynx as per pre-operative assessment

## 2022-11-25 NOTE — Transfer of Care (Signed)
Immediate Anesthesia Transfer of Care Note  Patient: [name]  Procedure(s) Performed: KNEE ARTHROSCOPY SYNOVECTOMY (Right: Knee) CHONDROPLASTY; LOOSE BODY EXCISION (Right: Knee)  Patient Location: PACU  Anesthesia Type:General  Level of Consciousness: drowsy  Airway & Oxygen Therapy: Patient Spontanous Breathing and Patient connected to face mask oxygen  Post-op Assessment: Report given to RN and Post -op Vital signs reviewed and stable  Post vital signs: Reviewed and stable  Last Vitals:  Vitals Value Taken Time  BP 151/85 11/25/22 1213  Temp    Pulse 71 11/25/22 1214  Resp 17 11/25/22 1214  SpO2 98 % 11/25/22 1214  Vitals shown include unvalidated device data.  Last Pain:  Vitals:   11/25/22 1019  TempSrc: Oral  PainSc: 5       Patients Stated Pain Goal: 3 (03/54/65 6812)  Complications: No notable events documented.

## 2022-11-25 NOTE — Anesthesia Preprocedure Evaluation (Addendum)
Anesthesia Evaluation  Patient identified by MRN, date of birth, ID band Patient awake    Reviewed: Allergy & Precautions, NPO status , Patient's Chart, lab work & pertinent test results  Airway Mallampati: III  TM Distance: >3 FB Neck ROM: Full    Dental no notable dental hx.    Pulmonary Current Smoker and Patient abstained from smoking.   Pulmonary exam normal        Cardiovascular hypertension, Pt. 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  Reproductive/Obstetrics S/p hysterectomy                             Anesthesia Physical Anesthesia Plan  ASA: 2  Anesthesia Plan: General   Post-op Pain Management:    Induction: Intravenous  PONV Risk Score and Plan: 2 and Ondansetron, Dexamethasone, Midazolam and Treatment may vary due to age or medical condition  Airway Management Planned: LMA  Additional Equipment:   Intra-op Plan:   Post-operative Plan: Extubation in OR  Informed Consent: I have reviewed the patients History and Physical, chart, labs and discussed the procedure including the risks, benefits and alternatives for the proposed anesthesia with the patient or authorized representative who has indicated his/her understanding and acceptance.     Dental advisory given  Plan Discussed with: CRNA  Anesthesia Plan Comments:        Anesthesia Quick Evaluation

## 2022-11-25 NOTE — Anesthesia Postprocedure Evaluation (Signed)
Anesthesia Post Note  Patient: [name]  Procedure(s) Performed: KNEE ARTHROSCOPY SYNOVECTOMY (Right: Knee) CHONDROPLASTY; LOOSE BODY EXCISION (Right: Knee)     Patient location during evaluation: PACU Anesthesia Type: General Level of consciousness: awake Pain management: pain level controlled Vital Signs Assessment: post-procedure vital signs reviewed and stable Respiratory status: spontaneous breathing, nonlabored ventilation and respiratory function stable Cardiovascular status: blood pressure returned to baseline and stable Postop Assessment: no apparent nausea or vomiting Anesthetic complications: no   No notable events documented.  Last Vitals:  Vitals:   11/25/22 1345 11/25/22 1420  BP: 126/83 (!) 144/85  Pulse: 65 75  Resp: 12 18  Temp:  (!) 36.2 C  SpO2: 95% 93%    Last Pain:  Vitals:   11/25/22 1420  TempSrc: Oral  PainSc: 4                  Maecie Sevcik P Nic Lampe

## 2022-11-25 NOTE — Interval H&P Note (Signed)
All questions answered, patient wants to proceed with procedure. ? ?

## 2022-11-25 NOTE — Op Note (Signed)
Orthopaedic Surgery Operative Note (CSN: [id number])  [name]  1981/08/20 Date of Surgery: 11/25/2022   Diagnoses:  Right knee PVNS  Procedure: Right arthroscopic synovectomy Right partial medial meniscectomy Right loose body excision, 2 x 3 cm   Operative Finding Exam under anesthesia: Full motion limitation Suprapatellar pouch: Large 2 x 3 cm loose body in the suprapatellar pouch.  This was consistent with PVNS.  This was removed through an accessory portal suprapatellar. Patellofemoral Compartment: Cartilage normal Medial Compartment: Small posterior medial meniscus tear at the root, 10% total meniscal volume resected Lateral Compartment: Normal Intercondylar Notch: Areas of PVNS in the anterior notch as well as in the area of the PCL Posterior compartment: Large areas of PVNS noted.  Removed through a posterior medial portal.  Successful completion of the planned procedure.  If patient has recurrence I would recommend going to a tertiary care center for another opinion.  Post-operative plan: The patient will be weightbearing to tolerance.  The patient will be discharged home.  DVT prophylaxis Aspirin 81 mg twice daily for 6 weeks.  Pain control with PRN pain medication preferring oral medicines.  Follow up plan will be scheduled in approximately 7 days for incision check and XR.  Post-Op Diagnosis: Same Surgeons:Primary: Hiram Gash, MD Assistants:Caroline McBane PA-C Location: [facility] ROOM 6 Anesthesia: General with local Antibiotics: Ancef 2 g Tourniquet time:  Total Tourniquet Time Documented: Thigh (Right) - 33 minutes Total: Thigh (Right) - 33 minutes  Estimated Blood Loss: Minimal Complications: None Specimens: None Implants: * No implants in log *  Indications for Surgery:   [name] is a 42 y.o. female with previous history of PVNS and continued pain.  MRI demonstrated recurrence particularly in the suprapatellar pouchand posterior joint.   Benefits and risks of operative and nonoperative management were discussed prior to surgery with patient/guardian(s) and informed consent form was completed.  Specific risks including infection, need for additional surgery, recurrence, continued pain, joint distraction amongst others.   Procedure:   The patient was identified properly. Informed consent was obtained and the surgical site was marked. The patient was taken up to suite where general anesthesia was induced. The patient was placed in the supine position with a post against the surgical leg and a nonsterile tourniquet applied. The surgical leg was then prepped and draped usual sterile fashion.  A standard surgical timeout was performed.  2 standard anterior portals were made and diagnostic arthroscopy performed. Please note the findings as noted above.  We cleared the joint as above.  A small medial meniscus tear was debrided as it was near the root, only about 15% total meniscal volume was resected.  There were still radial fibers intact.  There is significant diseased synovium in the anterior compartment.  This was debrided back as best we could with a shaver.  Samples were taken for specimen.  There was additionally bulky overgrowth around the ACL and PCL that was debrided back without damaging the ligaments.  We noted a large PVNS loose body in the suprapatellar pouch.  It was too large to fit through any of our standard incisions were made a accessory portal and superior lateral pouch.  2 x 3 cm lesion was sent for pathology.  Once we cleared the synovium in the suprapatellar pouch we then went to the posterior aspect of the joint.  Accessory posterior medial portal was made and we cleared abundant disease in the posterior aspect of the joint.  No excessive bleeding was  noted.    Incisions closed with absorbable suture. The patient was awoken from general anesthesia and taken to the PACU in stable condition without complication.    Courtney Chapel, PA-C, present and scrubbed throughout the case, critical for completion in a timely fashion, and for retraction, instrumentation, closure.

## 2022-11-26 ENCOUNTER — Encounter (HOSPITAL_BASED_OUTPATIENT_CLINIC_OR_DEPARTMENT_OTHER): Payer: Self-pay | Admitting: Orthopaedic Surgery

## 2022-11-29 LAB — SURGICAL PATHOLOGY

## 2022-12-31 ENCOUNTER — Ambulatory Visit (INDEPENDENT_AMBULATORY_CARE_PROVIDER_SITE_OTHER): Payer: Medicare Other | Admitting: Podiatry

## 2022-12-31 DIAGNOSIS — Z91199 Patient's noncompliance with other medical treatment and regimen due to unspecified reason: Secondary | ICD-10-CM

## 2022-12-31 NOTE — Progress Notes (Signed)
1. No-show for appointment

## 2023-01-04 ENCOUNTER — Emergency Department (HOSPITAL_COMMUNITY): Payer: Medicare Other

## 2023-01-04 ENCOUNTER — Emergency Department (HOSPITAL_COMMUNITY)
Admission: EM | Admit: 2023-01-04 | Discharge: 2023-01-04 | Disposition: A | Discharge status: Home / Self Care | Payer: Medicare Other | Attending: Emergency Medicine | Admitting: Emergency Medicine

## 2023-01-04 ENCOUNTER — Other Ambulatory Visit: Payer: Self-pay

## 2023-01-04 DIAGNOSIS — R4182 Altered mental status, unspecified: Secondary | ICD-10-CM | POA: Diagnosis not present

## 2023-01-04 DIAGNOSIS — R55 Syncope and collapse: Secondary | ICD-10-CM | POA: Insufficient documentation

## 2023-01-04 DIAGNOSIS — Z79899 Other long term (current) drug therapy: Secondary | ICD-10-CM | POA: Insufficient documentation

## 2023-01-04 DIAGNOSIS — Z7982 Long term (current) use of aspirin: Secondary | ICD-10-CM | POA: Insufficient documentation

## 2023-01-04 DIAGNOSIS — E1165 Type 2 diabetes mellitus with hyperglycemia: Secondary | ICD-10-CM | POA: Diagnosis not present

## 2023-01-04 DIAGNOSIS — D72829 Elevated white blood cell count, unspecified: Secondary | ICD-10-CM | POA: Insufficient documentation

## 2023-01-04 DIAGNOSIS — E876 Hypokalemia: Secondary | ICD-10-CM | POA: Diagnosis not present

## 2023-01-04 DIAGNOSIS — I10 Essential (primary) hypertension: Secondary | ICD-10-CM | POA: Insufficient documentation

## 2023-01-04 LAB — DIFFERENTIAL
Abs Immature Granulocytes: 0.06 10*3/uL (ref 0.00–0.07)
Basophils Absolute: 0 10*3/uL (ref 0.0–0.1)
Basophils Relative: 0 %
Eosinophils Absolute: 0 10*3/uL (ref 0.0–0.5)
Eosinophils Relative: 0 %
Immature Granulocytes: 1 %
Lymphocytes Relative: 11 %
Lymphs Abs: 1.4 10*3/uL (ref 0.7–4.0)
Monocytes Absolute: 0.2 10*3/uL (ref 0.1–1.0)
Monocytes Relative: 2 %
Neutro Abs: 11.3 10*3/uL — ABNORMAL HIGH (ref 1.7–7.7)
Neutrophils Relative %: 86 %

## 2023-01-04 LAB — URINALYSIS, ROUTINE W REFLEX MICROSCOPIC
Bacteria, UA: NONE SEEN
Bilirubin Urine: NEGATIVE
Glucose, UA: >=500 mg/dL — AB
Hgb urine dipstick: NEGATIVE
Ketones, ur: 5 mg/dL — AB
Leukocytes,Ua: NEGATIVE
Nitrite: NEGATIVE
Protein, ur: NEGATIVE mg/dL
Specific Gravity, Urine: 1.029 (ref 1.005–1.030)
pH: 6 (ref 5.0–8.0)

## 2023-01-04 LAB — PROTIME-INR
INR: 1 (ref 0.8–1.2)
Prothrombin Time: 13.3 seconds (ref 11.4–15.2)

## 2023-01-04 LAB — COMPREHENSIVE METABOLIC PANEL
ALT: 21 U/L (ref 0–44)
AST: 31 U/L (ref 15–41)
Albumin: 3.9 g/dL (ref 3.5–5.0)
Alkaline Phosphatase: 74 U/L (ref 38–126)
Anion gap: 16 — ABNORMAL HIGH (ref 5–15)
BUN: 10 mg/dL (ref 6–20)
CO2: 21 mmol/L — ABNORMAL LOW (ref 22–32)
Calcium: 9.8 mg/dL (ref 8.9–10.3)
Chloride: 100 mmol/L (ref 98–111)
Creatinine, Ser: 1.04 mg/dL — ABNORMAL HIGH (ref 0.44–1.00)
GFR, Estimated: >60 mL/min (ref >60)
Glucose, Bld: 242 mg/dL — ABNORMAL HIGH (ref 70–99)
Potassium: 3.1 mmol/L — ABNORMAL LOW (ref 3.5–5.1)
Sodium: 137 mmol/L (ref 135–145)
Total Bilirubin: 0.5 mg/dL (ref 0.3–1.2)
Total Protein: 7.6 g/dL (ref 6.5–8.1)

## 2023-01-04 LAB — I-STAT VENOUS BLOOD GAS, ED
Acid-Base Excess: 3 mmol/L — ABNORMAL HIGH (ref 0.0–2.0)
Bicarbonate: 26.4 mmol/L (ref 20.0–28.0)
Calcium, Ion: 1.14 mmol/L — ABNORMAL LOW (ref 1.15–1.40)
HCT: 42 % (ref 36.0–46.0)
Hemoglobin: 14.3 g/dL (ref 12.0–15.0)
O2 Saturation: 100 %
Potassium: 4.3 mmol/L (ref 3.5–5.1)
Sodium: 136 mmol/L (ref 135–145)
TCO2: 27 mmol/L (ref 22–32)
pCO2, Ven: 36.4 mmHg — ABNORMAL LOW (ref 44–60)
pH, Ven: 7.468 — ABNORMAL HIGH (ref 7.25–7.43)
pO2, Ven: 203 mmHg — ABNORMAL HIGH (ref 32–45)

## 2023-01-04 LAB — RAPID URINE DRUG SCREEN, HOSP PERFORMED
Amphetamines: NOT DETECTED
Barbiturates: NOT DETECTED
Benzodiazepines: NOT DETECTED
Cocaine: NOT DETECTED
Opiates: NOT DETECTED
Tetrahydrocannabinol: POSITIVE — AB

## 2023-01-04 LAB — CBC
HCT: 40.8 % (ref 36.0–46.0)
Hemoglobin: 13.8 g/dL (ref 12.0–15.0)
MCH: 28.8 pg (ref 26.0–34.0)
MCHC: 33.8 g/dL (ref 30.0–36.0)
MCV: 85 fL (ref 80.0–100.0)
Platelets: 313 10*3/uL (ref 150–400)
RBC: 4.8 MIL/uL (ref 3.87–5.11)
RDW: 14.6 % (ref 11.5–15.5)
WBC: 13 10*3/uL — ABNORMAL HIGH (ref 4.0–10.5)
nRBC: 0 % (ref 0.0–0.2)

## 2023-01-04 LAB — ETHANOL: Alcohol, Ethyl (B): <10 mg/dL (ref <10)

## 2023-01-04 LAB — I-STAT CHEM 8, ED
BUN: 11 mg/dL (ref 6–20)
Calcium, Ion: 1.16 mmol/L (ref 1.15–1.40)
Chloride: 100 mmol/L (ref 98–111)
Creatinine, Ser: 0.7 mg/dL (ref 0.44–1.00)
Glucose, Bld: 247 mg/dL — ABNORMAL HIGH (ref 70–99)
HCT: 42 % (ref 36.0–46.0)
Hemoglobin: 14.3 g/dL (ref 12.0–15.0)
Potassium: 3.2 mmol/L — ABNORMAL LOW (ref 3.5–5.1)
Sodium: 139 mmol/L (ref 135–145)
TCO2: 24 mmol/L (ref 22–32)

## 2023-01-04 LAB — I-STAT BETA HCG BLOOD, ED (MC, WL, AP ONLY): I-stat hCG, quantitative: <5 m[IU]/mL (ref <5)

## 2023-01-04 LAB — CBG MONITORING, ED: Glucose-Capillary: 238 mg/dL — ABNORMAL HIGH (ref 70–99)

## 2023-01-04 LAB — APTT: aPTT: 27 seconds (ref 24–36)

## 2023-01-04 MED ORDER — ONDANSETRON HCL 4 MG/2ML IJ SOLN
4.0000 mg | Freq: Once | INTRAMUSCULAR | Status: AC
  Administered 2023-01-04: 4 mg via INTRAVENOUS
  Filled 2023-01-04: qty 2

## 2023-01-04 MED ORDER — GADOBUTROL 1 MMOL/ML IV SOLN
8.0000 mL | Freq: Once | INTRAVENOUS | Status: AC | PRN
  Administered 2023-01-04: 8 mL via INTRAVENOUS

## 2023-01-04 MED ORDER — LORAZEPAM 2 MG/ML IJ SOLN
0.5000 mg | Freq: Once | INTRAMUSCULAR | Status: AC | PRN
  Administered 2023-01-04: 0.5 mg via INTRAVENOUS
  Filled 2023-01-04: qty 1

## 2023-01-04 NOTE — ED Notes (Signed)
Pt moved to room 27.

## 2023-01-04 NOTE — ED Notes (Signed)
Pt back to room after MRI

## 2023-01-04 NOTE — ED Notes (Signed)
Edp called to bedside. Unable to complete NIH d/t pt suddenly unable to follow commands

## 2023-01-04 NOTE — ED Triage Notes (Signed)
Pt arrived from home via [facility] s/p loc with fall. Pt vomited x 1, began hallucinating, then fell out. Pt remains very weak and dizzy.

## 2023-01-04 NOTE — ED Notes (Signed)
Pt to MRI

## 2023-01-04 NOTE — ED Notes (Signed)
Pt taken to CT, Dr Kimber Relic canceled code stroke. Pt alert and oriented, c/o back pain. Denies headache. States left arm numbness is from previous surgery.

## 2023-01-04 NOTE — ED Provider Notes (Signed)
[facility] Provider Note   CSN: [id number] Arrival date & time: 01/04/23  0025  An emergency department physician performed an initial assessment on this suspected stroke patient at 0044.  History  Chief Complaint  Patient presents with   Loss of Consciousness   Code Stroke    [name] is a 42 y.o. female who presents via EMS.  Is called to the bedside due to concern for sudden change in level of consciousness.  Per EMS they were called out to the home for episode of loss of consciousness.  Patient had lumbar spinal epidural corticosteroid injection today.  According to her husband was behaving normally at 84 PM at which time she did the restroom, vomited and subsequently syncopized.  When she aroused, she was agitated, disoriented prompting EMS call.  Upon their arrival she was agitated to the point where they considered sedating her with Haldol, however had sudden change in altered mental status with GCS declining, ANO x 1 at that point though gradually improving.  CBG in the 300s.  Per patient's husband she had similar episode in the past following corticosteroid injection as well.  When I was called to the bedside for EMS triage, EMS reported that patient was complaining of left upper extremity numbness before significantly altered again, room complaining of dizziness, patient repeating her questions at time of my evaluation, unable to answer orientation questions.  Patient with the all 4 extremities spontaneously, however due to reported left upper extremity numbness and change in mental status code stroke was activated at 0044.  The 5 caveat due to acute presentation upon arrival.  I personally reviewed medical records which history of type 2 diabetes, hypertension, hyperlipidemia, lumbar herniated disks with chronic back pain.  No anticoagulation.  Patient is following with pain management at [facility], per chart review it appears  that left upper extremity numbness has been ongoing since prior cervical surgery, patient and family now able to confirm this.6 recent MRI lumbar spine revealed lumbar radiculopathy with contact of the descending right S1 nerve root.  Patient underwent steroid injection for this today.  Per chart review patient was admitted for right arthroscopic synovectomy and medial meniscectomy for right knee in January of this year, had L5-S1 microdiscectomy with Dr. Saintclair Halsted from [facility] for surgery in August 2023.  HPI     Home Medications Prior to Admission medications   Medication Sig Start Date End Date Taking? Authorizing Provider  aspirin (ASPIRIN CHILDRENS) 81 MG chewable tablet Chew 1 tablet (81 mg total) by mouth 2 (two) times daily. For 6 weeks for DVT prophylaxis after surgery 11/25/22 01/06/23  Ethelda Chick, PA-C  cyclobenzaprine (FLEXERIL) 10 MG tablet Take 1 tablet (10 mg total) by mouth 3 (three) times daily as needed for muscle spasms. 01/02/22   Kristeen Miss, MD  empagliflozin (JARDIANCE) 10 MG TABS tablet Take 1 tablet (10 mg total) by mouth daily. 04/15/20   Nuala Alpha, MD  FLUoxetine (PROZAC) 40 MG capsule Take 1 capsule (40 mg total) by mouth daily. 01/10/22   Dameron, Luna Fuse, DO  lidocaine (LIDODERM) 5 % Place 1 patch onto the skin daily. Remove & Discard patch within 12 hours or as directed by MD Patient not taking: Reported on 12/17/2021 07/05/21   Jeanell Sparrow, DO  losartan-hydrochlorothiazide (HYZAAR) 100-12.5 MG tablet Take 1 tablet by mouth daily. 11/03/21   Chambliss, Jeb Levering, MD  nicotine (NICODERM CQ - DOSED IN MG/24 HOURS) 21 mg/24hr patch 21  mg daily. 08/17/22   [provider]  nicotine polacrilex (NICORETTE) 2 MG gum SMARTSIG:1 gum By Mouth Every 2 Hours PRN 08/18/22   [provider]  rosuvastatin (CRESTOR) 10 MG tablet Take 10 mg by mouth daily. 09/02/22   [provider]  SUMAtriptan (IMITREX) 50 MG tablet Take 1 tablet (50 mg total) by mouth  every 2 (two) hours as needed for migraine. May repeat in 2 hours if headache persists or recurs. 12/12/20   Maness, Arnette Norris, MD      Allergies    Bee venom, Penicillins, Mushroom extract complex, Ciprofloxacin, and Powder    Review of Systems   Review of Systems  Unable to perform ROS: Acuity of condition  Genitourinary:  Negative for difficulty urinating.  Musculoskeletal:  Positive for back pain.  Neurological:  Positive for numbness.  Psychiatric/Behavioral:  Positive for agitation and confusion.     Physical Exam Updated Vital Signs BP 107/70 (BP Location: Right Arm)   Pulse 66   Temp 98.4 F (36.9 C) (Oral)   Resp 14   LMP 09/11/2017 (Approximate)   SpO2 96%  Physical Exam Vitals and nursing note reviewed.  Constitutional:      Appearance: She is obese. She is not ill-appearing or toxic-appearing.  HENT:     Head: Normocephalic and atraumatic.     Mouth/Throat:     Mouth: Mucous membranes are moist.     Pharynx: No oropharyngeal exudate or posterior oropharyngeal erythema.  Eyes:     General:        Right eye: No discharge.        Left eye: No discharge.     Extraocular Movements: Extraocular movements intact.     Conjunctiva/sclera: Conjunctivae normal.     Pupils: Pupils are equal, round, and reactive to light.  Cardiovascular:     Rate and Rhythm: Normal rate and regular rhythm.     Pulses: Normal pulses.     Heart sounds: Normal heart sounds. No murmur heard. Pulmonary:     Effort: Pulmonary effort is normal. No respiratory distress.     Breath sounds: Normal breath sounds. No wheezing or rales.  Abdominal:     General: Bowel sounds are normal. There is no distension.     Palpations: Abdomen is soft.     Tenderness: There is no abdominal tenderness. There is no guarding or rebound.  Musculoskeletal:        General: No deformity.     Cervical back: Neck supple.       Back:     Right lower leg: No edema.     Left lower leg: No edema.  Skin:    General:  Skin is warm and dry.     Capillary Refill: Capillary refill takes less than 2 seconds.  Neurological:     General: No focal deficit present.     Mental Status: She is alert. She is confused.     GCS: GCS eye subscore is 4. GCS verbal subscore is 4. GCS motor subscore is 5.     Cranial Nerves: Cranial nerves 2-12 are intact.     Sensory: Sensory deficit present.     Motor: Motor function is intact.     Coordination: Coordination is intact.     Comments: LUE decreased compared to right, per patient at baseline. Gait and romberg deferred.  A&O x 2  Patient subsequently able to ambulate to and from the bathroom independently.   Psychiatric:  Mood and Affect: Mood normal.     ED Results / Procedures / Treatments   Labs (all labs ordered are listed, but only abnormal results are displayed) Labs Reviewed  CBC - Abnormal; Notable for the following components:      Result Value   WBC 13.0 (*)    All other components within normal limits  DIFFERENTIAL - Abnormal; Notable for the following components:   Neutro Abs 11.3 (*)    All other components within normal limits  COMPREHENSIVE METABOLIC PANEL - Abnormal; Notable for the following components:   Potassium 3.1 (*)    CO2 21 (*)    Glucose, Bld 242 (*)    Creatinine, Ser 1.04 (*)    Anion gap 16 (*)    All other components within normal limits  RAPID URINE DRUG SCREEN, HOSP PERFORMED - Abnormal; Notable for the following components:   Tetrahydrocannabinol POSITIVE (*)    All other components within normal limits  URINALYSIS, ROUTINE W REFLEX MICROSCOPIC - Abnormal; Notable for the following components:   Color, Urine STRAW (*)    Glucose, UA >=500 (*)    Ketones, ur 5 (*)    All other components within normal limits  I-STAT CHEM 8, ED - Abnormal; Notable for the following components:   Potassium 3.2 (*)    Glucose, Bld 247 (*)    All other components within normal limits  CBG MONITORING, ED - Abnormal; Notable for the  following components:   Glucose-Capillary 238 (*)    All other components within normal limits  I-STAT VENOUS BLOOD GAS, ED - Abnormal; Notable for the following components:   pH, Ven 7.468 (*)    pCO2, Ven 36.4 (*)    pO2, Ven 203 (*)    Acid-Base Excess 3.0 (*)    Calcium, Ion 1.14 (*)    All other components within normal limits  ETHANOL  PROTIME-INR  APTT  I-STAT BETA HCG BLOOD, ED ([facility], [facility], [facility] ONLY)    EKG None  Radiology MR Lumbar Spine W Wo Contrast  Result Date: 01/04/2023 CLINICAL DATA:  Low back pain, infection suspected, epidural injection at right S1 today, no significant right low back hip pain EXAM: MRI LUMBAR SPINE WITHOUT AND WITH CONTRAST TECHNIQUE: Multiplanar and multiecho pulse sequences of the lumbar spine were obtained without and with intravenous contrast. CONTRAST:  23m GADAVIST GADOBUTROL 1 MMOL/ML IV SOLN COMPARISON:  10/15/2022 FINDINGS: Segmentation:  5 lumbar type vertebral bodies. Alignment:  Mild levocurvature.  No significant listhesis. Vertebrae: No fracture, evidence of discitis, or bone lesion. Endplate degenerative changes at L5-S1. Prior right hemilaminectomy at L5-S1. Conus medullaris and cauda equina: Conus extends to the L1 level. Conus and cauda equina appear normal. No abnormal enhancement. No evidence of epidural hematoma. Paraspinal and other soft tissues: Postsurgical changes posterior to L5-S1. In the laminectomy defect there is mildly enhancing material, likely granulation tissue, without significant mass effect on the adjacent thecal sac. On STIR, this area and some of the surrounding tissue is T2 hyperintense (series 6, image 17), which may represent inflammation or edema. Disc levels: T12-L1: No significant disc bulge. No spinal canal stenosis or neural foraminal narrowing. L1-L2: No significant disc bulge. No spinal canal stenosis or neural foraminal narrowing. L2-L3: No significant disc bulge. No spinal canal stenosis or neural foraminal  narrowing. L3-L4: No significant disc bulge. No spinal canal stenosis or neural foraminal narrowing. L4-L5: Disc desiccation and mild disc bulge with central/right paracentral annular fissure and disc protrusion. Mild facet arthropathy.  Narrowing of the right lateral recess. No spinal canal stenosis or neural foraminal narrowing. L5-S1: Status post right hemilaminectomy. Redemonstrated right subarticular disc extrusion with 10 mm of cranial migration (series 5, image 7), unchanged. This narrows the right lateral recess and likely contacts the descending right L5 and S1 nerve roots. No spinal canal stenosis or neural foraminal narrowing. IMPRESSION: 1. Postsurgical changes at L5-S1, with granulation tissue in the defect without significant mass effect on the adjacent thecal sac. On STIR, this area and some of the surrounding tissue is T2 hyperintense, which may represent inflammation or edema, possibly related to the patient's epidural injection, although no epidural collection is seen. 2. Unchanged L5-S1 right subarticular disc extrusion with 10 mm of cranial migration, which narrows the right lateral recess and likely contacts the descending right L5 and S1 nerve roots. 3. Unchanged L4-L5 central/right paracentral annular fissure and disc protrusion, which narrows the right lateral recess, which could affect the descending right L5 nerve roots. Electronically Signed   By: Merilyn Baba M.D.   On: 01/04/2023 03:52   CT Lumbar Spine Wo Contrast  Addendum Date: 01/04/2023   ADDENDUM REPORT: 01/04/2023 01:41 ADDENDUM: In discussion with the patient's provider, the patient underwent an epidural injection today and there is some indeterminate low density material in the laminectomy defect (series 4, image 110), which did not appear to be present on the 08/27/2021 CT. This could be further evaluated with MRI. Electronically Signed   By: Merilyn Baba M.D.   On: 01/04/2023 01:41   Result Date: 01/04/2023 CLINICAL DATA:   Low back pain, fall EXAM: CT LUMBAR SPINE WITHOUT CONTRAST TECHNIQUE: Multidetector CT imaging of the lumbar spine was performed without intravenous contrast administration. Multiplanar CT image reconstructions were also generated. RADIATION DOSE REDUCTION: This exam was performed according to the departmental dose-optimization program which includes automated exposure control, adjustment of the mA and/or kV according to patient size and/or use of iterative reconstruction technique. COMPARISON:  08/27/2021 CT lumbar spine, 10/15/2022 MRI lumbar spine FINDINGS: Segmentation: 5 lumbar type vertebral bodies. The last fully formed disc space is labeled L5-S1. Alignment: No significant listhesis. Vertebrae: No acute fracture or suspicious osseous lesion. Mild wedging of T11 and T12. Paraspinal and other soft tissues: Mild aortic atherosclerosis. Disc levels: T12-L1: No significant disc bulge. No spinal canal stenosis or neural foraminal narrowing. L1-L2: No significant disc bulge. No spinal canal stenosis or neural foraminal narrowing. L2-L3: No significant disc bulge. No spinal canal stenosis or neural foraminal narrowing. L3-L4: No significant disc bulge. No spinal canal stenosis or neural foraminal narrowing. L4-L5: Mild disc bulge. Mild facet arthropathy. No spinal canal stenosis or neural foraminal narrowing. L5-S1: Disc height loss and disc osteophyte complex with redemonstrated right subarticular disc extrusion with cranial migration. Mild facet arthropathy. Prior right hemilaminectomy. No spinal canal stenosis or neural foraminal narrowing. IMPRESSION: 1. No acute fracture or traumatic malalignment. 2. Unchanged right subarticular disc extrusion at L5-S1 with cranial migration. 3. No spinal canal stenosis or neural foraminal narrowing. 4. Aortic atherosclerosis. Aortic Atherosclerosis (ICD10-I70.0). Electronically Signed: By: Merilyn Baba M.D. On: 01/04/2023 01:20   CT HEAD CODE STROKE WO CONTRAST  Result  Date: 01/04/2023 CLINICAL DATA:  Code stroke. Left upper extremity numbness, altered mental status, loss of consciousness EXAM: CT HEAD WITHOUT CONTRAST TECHNIQUE: Contiguous axial images were obtained from the base of the skull through the vertex without intravenous contrast. RADIATION DOSE REDUCTION: This exam was performed according to the departmental dose-optimization program which includes automated exposure control,  adjustment of the mA and/or kV according to patient size and/or use of iterative reconstruction technique. COMPARISON:  06/17/2017 CT head FINDINGS: Brain: No evidence of acute infarction, hemorrhage, mass, mass effect, or midline shift. No hydrocephalus or extra-axial collection. Vascular: No hyperdense vessel. Skull: Negative for fracture or focal lesion. Sinuses/Orbits: No acute finding. Other: The mastoid air cells are well aerated. ASPECTS [facility] Stroke Program Early CT Score) - Ganglionic level infarction (caudate, lentiform nuclei, internal capsule, insula, M1-M3 cortex): 7 - Supraganglionic infarction (M4-M6 cortex): 3 Total score (0-10 with 10 being normal): 10 IMPRESSION: No acute intracranial process.  ASPECTS is 10. Imaging results were communicated on 01/04/2023 at 12:57 am to provider Dr. Rory Percy via secure text paging. Electronically Signed   By: Merilyn Baba M.D.   On: 01/04/2023 00:57    Procedures .Critical Care  Performed by: Emeline Darling, PA-C Authorized by: Emeline Darling, PA-C   Critical care provider statement:    Critical care time (minutes):  45   Critical care was time spent personally by me on the following activities:  Development of treatment plan with patient or surrogate, discussions with consultants, evaluation of patient's response to treatment, examination of patient, obtaining history from patient or surrogate, ordering and performing treatments and interventions, ordering and review of laboratory studies, ordering and review of  radiographic studies, pulse oximetry and re-evaluation of patient's condition     Medications Ordered in ED Medications  ondansetron (ZOFRAN) injection 4 mg (4 mg Intravenous Given 01/04/23 0125)  LORazepam (ATIVAN) injection 0.5 mg (0.5 mg Intravenous Given 01/04/23 0230)  gadobutrol (GADAVIST) 1 MMOL/ML injection 8 mL (8 mLs Intravenous Contrast Given 01/04/23 [id number])    ED Course/ Medical Decision Making/ A&P Clinical Course as of 01/04/23 0533  Tue Jan 04, 2023  0118 Comment 3:        [RS]  0138 Phone conversation with reading radiologist Dr. Lennon Alstrom to pay special attention to the right side S1 area where she received corticosteroid injection today, per radiologist on review of images there does appear to be some abnormality question of possible fluid suggestive of hemorrhage in this area recommends MRI.  I appreciate her collaboration in the care of this patient. [RS]  0413 Discussed the patient's MRI read over the phone with neurosurgeon Dr. Trenton Gammon.  Given patient is back to her neurologic baseline and very short interval between epidural injection and onset of symptoms he favors mass effect of epidural injection medication for exacerbation of her pain rather than hematoma or infectious etiology for symptoms.  Additionally it seems that patient systemically is sensitive to steroid that circulated from epidural injection as she has had similar reaction in the past.  He does not feel any admission or further workup is warranted in the ED at this time from a neurosurgical perspective.  I appreciate his collaboration in the care of this patient. [RS]    Clinical Course User Index [RS] Sowmya Partridge, Gypsy Balsam, PA-C                             Medical Decision Making 42 year old female presents with altered mental status.  Hypertensive, tachycardic, tachypneic on intake, vital signs now improved.  Cardiopulmonary abdominal exam is benign.  Neurologic exam concerning for decreased GCS and question of  numbness in the left upper extremity now known to be at patient's baseline.  Code stroke was activated at time of evaluation in triage.  The differential diagnosis for  AMS is extensive and includes, but is not limited to:  Drug overdose - opioids, alcohol, sedatives, antipsychotics, drug withdrawal, others Metabolic: hypoxia, hypoglycemia, hyperglycemia, hypercalcemia, hypernatremia, hyponatremia, uremia, hepatic encephalopathy, hypothyroidism, hyperthyroidism, vitamin B12 or thiamine deficiency, carbon monoxide poisoning, Wilson's disease, Lactic acidosis, DKA/HHOS Infectious: meningitis, encephalitis, bacteremia/sepsis, urinary tract infection, pneumonia, neurosyphilis Structural: Space-occupying lesion, (brain tumor, subdural hematoma, hydrocephalus,) Vascular: stroke, subarachnoid hemorrhage, coronary ischemia, hypertensive encephalopathy, CNS vasculitis, thrombotic thrombocytopenic purpura, disseminated intravascular coagulation, hyperviscosity Psychiatric: Schizophrenia, depression; Other: Seizure, hypothermia, heat stroke, ICU psychosis, dementia -"sundowning."     Amount and/or Complexity of Data Reviewed Labs: ordered. Decision-making details documented in ED Course.    Details: CBC with leukocytosis of 13,000, CMP with hypokalemia of 3.1, hyperglycemia of 242, mild elevation in anion gap to 16.  Ethanol and INR are normal, patient is not pregnant.  Radiology: ordered.    Details:  CT head code stroke negative for acute cranial process, per neurologist Dr. Rory Percy, no indication for MRI of the brain at this time. CT lumbar spine obtained secondary to epidural injection today evaluate for possible epidural hematoma or abscess.  No acute finding. MR lumbar spine with post-op changes, no acute epidural collection. No cauda equina syndrome.   Risk Prescription drug management.   Patient's physical exam and mental status has significantly improved at this time. Patient ambulatory in the ED  without difficulty, husband at the bedside confirms that patient is back at baseline.   Per discussion with neurosurgery, clinically doubt epidural hemorrhage or infectious etiology. Favor mass effect from injection of medication today, and likely side effect of agitation/ confusion from systemic escape of steroid from epidural injection. Patient with hx of same from epidural injection in the past.   Clinical concern for emergent underlying etiology of her symptoms that would warrant further ED workup or inpatient management is exceedingly low.   Lequita and her husband voiced understanding of her medical evaluation and treatment plan. Each of their questions answered to their expressed satisfaction.  Return precautions were given.  Patient is well-appearing, stable, and was discharged in good condition.  This chart was dictated using voice recognition software, Dragon. Despite the best efforts of this provider to proofread and correct errors, errors may still occur which can change documentation meaning.   Final Clinical Impression(s) / ED Diagnoses Final diagnoses:  Altered mental status, unspecified altered mental status type    Rx / DC Orders ED Discharge Orders     None         Emeline Darling, PA-C 01/04/23 0533    Ripley Fraise, MD 01/04/23 [phone]

## 2023-01-04 NOTE — ED Notes (Signed)
Code stroke canceled by Dr Kimber Relic, neurologist

## 2023-01-04 NOTE — Consult Note (Signed)
Neurology Consultation  Reason for Consult: Code stroke-left arm numbness, dizziness Referring Physician: Dr. Christy Gentles  CC: Left-sided numbness, dizziness  History is obtained from: Patient, chart  HPI: [name] is a 42 y.o. female past medical history of anxiety, depression, diabetes, chronic back pain, hypertension, headaches, history of generalized paresthesias with patchy sensory loss, status post lumbar surgery at L5-S1 segment in March 2021 by Dr. Saintclair Halsted, presenting to the emergency room after having received epidural steroid injections this evening at [facility] for evaluation of dizziness and left-sided numbness along with possible syncopal episode. According to the husband, she had her procedure done this afternoon, came back home, took her medication, ate spaghetti for dinner, went to bed and at around 11 PM, woke up to go to the bathroom and was found down on the ground.  Seem to remain somewhat confused and agitated when EMS was called.  Concern was that she probably passed out.  No seizure activity witnessed.  He said that her eyes were closed and she was breathing with nonresponsive.  She was brought in for emergent evaluation.  In the triage, she kept repetitive answering and was unable to follow commands. On screening examination by the ED providers, she complained of some left arm numbness along with complaints of dizziness.  She is unable to follow their fingers although no clear extraocular movement abnormalities were documented or reported. Code stroke was activated for those reasons. On further questioning, the left arm numbness has been ongoing since her last spine surgery.  I do see a note from Dr. Posey Pronto, who was also made aware of this left arm numbness that the patient attributes to her low back surgery and discussions have been had that this is likely unrelated. She reports feeling nauseous and out of sorts at this time.  She reports that similar situations of  happened after procedures in the past but this is way worse than the prior episodes of feeling lightheaded and dizzy after procedures or anesthesia.  Husband confirms the history above. Denies any new medications. Denies any pain medications Reports severe pain in her hips since the procedure this afternoon. No recent illnesses or sicknesses.  Care everywhere review notes reveals transforaminal steroid block for acute on chronic lumbosacral radiculopathy pain was done today.   LKW: 11 PM, 01/03/2023 IV thrombolysis given?: no, nonfocal findings, recent epidural injection few hours ago Premorbid modified Rankin scale (mRS):2   ROS: Full ROS was performed and is negative except as noted in the HPI.  Past Medical History:  Diagnosis Date   Abdominal lipoma 05/2015   Abdominal pain, RLQ 10/03/2017   Acute medial meniscus tear of right knee 06/09/2019   Anxiety    Arthritis    lower back   Depression 2019   DM type 2 with diabetic peripheral neuropathy (HCC)    Borderline   GERD (gastroesophageal reflux disease)    Headache    Migraines   History of MRSA infection ~ 2006   leg   History of shingles    Hx of hydronephrosis    following total hysterectomy surgery ; Right kidney ; see CT abdomen 10-02-17 epic; states " a plug had got damaged during the surgery so thats why they put the stent in"    Hypertension    under control with med., has been on med. x 6 mos.   Lumbosacral spondylolysis    see MRI 06-01-17 epic    Mood swing    Optic nerve swelling  seen by Dr Mora Bellman 09-07-17, did lumbar puncture 09-19-17; patient states " they never called me about results ut i have an appt with him in april"; patient also states  " my vision is still blurry and worse when i get the migraines, they think the migraines are causing it"   Psoriasis    Family History  Problem Relation Age of Onset   Drug abuse Mother    Heart disease Maternal Grandmother    Heart disease Maternal Grandfather     Social History:   reports that she has been smoking cigarettes. She started smoking about 8 years ago. She has a 8.00 pack-year smoking history. She has never used smokeless tobacco. She reports that she does not drink alcohol and does not use drugs.  Medications No current facility-administered medications for this encounter.  Current Outpatient Medications:    aspirin (ASPIRIN CHILDRENS) 81 MG chewable tablet, Chew 1 tablet (81 mg total) by mouth 2 (two) times daily. For 6 weeks for DVT prophylaxis after surgery, Disp: 84 tablet, Rfl: 0   cyclobenzaprine (FLEXERIL) 10 MG tablet, Take 1 tablet (10 mg total) by mouth 3 (three) times daily as needed for muscle spasms., Disp: 30 tablet, Rfl: 2   empagliflozin (JARDIANCE) 10 MG TABS tablet, Take 1 tablet (10 mg total) by mouth daily., Disp: 90 tablet, Rfl: 3   FLUoxetine (PROZAC) 40 MG capsule, Take 1 capsule (40 mg total) by mouth daily., Disp: 30 capsule, Rfl: 3   lidocaine (LIDODERM) 5 %, Place 1 patch onto the skin daily. Remove & Discard patch within 12 hours or as directed by MD (Patient not taking: Reported on 12/17/2021), Disp: 30 patch, Rfl: 0   losartan-hydrochlorothiazide (HYZAAR) 100-12.5 MG tablet, Take 1 tablet by mouth daily., Disp: 90 tablet, Rfl: 3   nicotine (NICODERM CQ - DOSED IN MG/24 HOURS) 21 mg/24hr patch, 21 mg daily., Disp: , Rfl:    nicotine polacrilex (NICORETTE) 2 MG gum, SMARTSIG:1 gum By Mouth Every 2 Hours PRN, Disp: , Rfl:    rosuvastatin (CRESTOR) 10 MG tablet, Take 10 mg by mouth daily., Disp: , Rfl:    SUMAtriptan (IMITREX) 50 MG tablet, Take 1 tablet (50 mg total) by mouth every 2 (two) hours as needed for migraine. May repeat in 2 hours if headache persists or recurs., Disp: 10 tablet, Rfl: 3  Exam: Current vital signs: BP 129/72   Pulse 91   Temp 98.3 F (36.8 C) (Oral)   Resp 16   LMP 09/11/2017 (Approximate)   SpO2 100%  Vital signs in last 24 hours: Temp:  [98.3 F (36.8 C)] 98.3 F (36.8 C)  (03/05 0036) Pulse Rate:  [91-117] 91 (03/05 0105) Resp:  [16-21] 16 (03/05 0104) BP: (129-175)/(72-101) 129/72 (03/05 0105) SpO2:  [96 %-100 %] 100 % (03/05 0105) General: The patient is awake, alert, in a lot of discomfort due to pain HEENT: Normocephalic atraumatic Lungs: Clear Cardiovascular: Regular rhythm Abdomen nondistended nontender Extremities: She has extreme allodynia-on her waist, hips, thighs, calfs and feet. Neurological exam Awake alert, in extreme discomfort due to pain. She is able to tell me her name date of birth and month correctly She is able to recall all the events that happened today correctly with the exception of what happened after 11 PM till about her getting to the emergency room. Speech is not dysarthric There is no evidence of aphasia Cranial nerves II to XII intact Motor examination with no drift in the upper extremity.  Lower extremity examination is  limited by pain. Sensation: Allodynia in bilateral lower and upper extremities Coordination with no gross dysmetria in upper extremities.  Unable to test in lower extremities NIHSS 1a Level of Conscious.: 0 1b LOC Questions: 0 1c LOC Commands: 0 2 Best Gaze: 0 3 Visual: 0 4 Facial Palsy: 0 5a Motor Arm - left: 0 5b Motor Arm - Right: 0 6a Motor Leg - Left: 3 6b Motor Leg - Right: 3 7 Limb Ataxia: 0 8 Sensory: 0 9 Best Language: 0 10 Dysarthria: 0 11 Extinct. and Inatten.: 0 TOTAL: 6   Labs I have reviewed labs in epic and the results pertinent to this consultation are:  CBC    Component Value Date/Time   WBC 13.0 (H) 01/04/2023 0042   RBC 4.80 01/04/2023 0042   HGB 14.3 01/04/2023 0107   HCT 42.0 01/04/2023 0107   PLT 313 01/04/2023 0042   MCV 85.0 01/04/2023 0042   MCH 28.8 01/04/2023 0042   MCHC 33.8 01/04/2023 0042   RDW 14.6 01/04/2023 0042   LYMPHSABS 1.4 01/04/2023 0042   MONOABS 0.2 01/04/2023 0042   EOSABS 0.0 01/04/2023 0042   BASOSABS 0.0 01/04/2023 0042    CMP      Component Value Date/Time   NA 139 01/04/2023 0107   NA 140 01/28/2021 1611   K 3.2 (L) 01/04/2023 0107   CL 100 01/04/2023 0107   CO2 21 (L) 01/04/2023 0042   GLUCOSE 247 (H) 01/04/2023 0107   BUN 11 01/04/2023 0107   BUN 7 01/28/2021 1611   CREATININE 0.70 01/04/2023 0107   CREATININE 0.60 10/18/2016 1123   CALCIUM 9.8 01/04/2023 0042   PROT 7.6 01/04/2023 0042   PROT 7.4 01/28/2021 1611   ALBUMIN 3.9 01/04/2023 0042   ALBUMIN 4.4 01/28/2021 1611   AST 31 01/04/2023 0042   ALT 21 01/04/2023 0042   ALKPHOS 74 01/04/2023 0042   BILITOT 0.5 01/04/2023 0042   BILITOT 0.2 01/28/2021 1611   GFRNONAA >60 01/04/2023 0042   GFRNONAA >89 10/18/2016 1123   GFRAA 140 09/05/2020 1550   GFRAA >89 10/18/2016 1123    Imaging I have reviewed the images obtained:  CT-head-no acute intracranial pathology.  Aspects 10.   Assessment: 42 year old with past history of anxiety, depression, diabetes, chronic back pain, hypertension, headaches, history of generalized paresthesias with patchy sensory loss, status post lumbar surgery and L5-S1 segments in 2021 presenting after her epidural steroid injection today, returning home normal then at around 11 PM having an episode of what sounded like a syncopal episode after which she appeared confused and agitated complaining of excessive pain in her hips and legs.  ED provider activated code stroke due to her inability to follow commands as well as subjective left upper extremity numbness-which on later review was found to be an old complaint, that has not been there for many years On my examination, I could not elicit much of focal findings. There is no report of actual syncopal episode or witnessed seizure activity. She has had similar kind of reactions of feeling unwell and nausea and vomiting postsurgically in the past but this 1 was more severe in terms of her being confused and altered. Noncontrast head CT was unremarkable I do not think that her  current clinical presentation is related to a stroke.  I suspect that this is related to pain in the recent procedure although I do not have a very clear explanation of why she would have these episodes. I would recommend some further workup  as below  Impression: Toxic metabolic encephalopathy  Recommendations: Check CBC BMP If there is any concern for an infectious process/bleed on CT L spine, please pursue MRI of the L-spine. Check urinary toxicology screen Check UA and chest x-ray Very low suspicion for stroke-do not feel the need for a stat MRI brain.  Consider an MRI of the brain, if she does not start coming around to her baseline and has any focal findings. Plan was discussed with the patient and her husband at bedside and the ED provider Rod Can, PA-C.  -- Amie Portland, MD Neurologist [facility] Pager: [phone]

## 2023-01-04 NOTE — Discharge Instructions (Signed)
You were seen in the ER tonight for your episode of agitation and your right hip and back pain. While the exact cause of your symptoms remains unclear, there does not appear to be any emergent source at this time. Your pain and confusion are likely side effects of the epidural steroid injection you received today. Please follow up with your neurosurgeon and return to the ER with any severe symptoms.

## 2023-01-04 NOTE — ED Notes (Signed)
Called to EMS triage for code stroke. Report that pt had cortisol injections today and then passed out after vomiting. Arrived to room 13 in wheelchair. Pt then became dizzy, c/o left upper arm numbess. Per EMS triage nurse report, pt became repetative and kept saying "my name is [name]" Pt unable to follow fingers or even see them.

## 2023-01-04 NOTE — ED Notes (Signed)
[facility] called to activate Code Stroke, LSN 2300, ams, dizzy, and numbness in left arm. CT 2 then to RM 27. Info given to Tammy at [facility].

## 2023-08-31 ENCOUNTER — Emergency Department (HOSPITAL_COMMUNITY): Payer: Medicare Other

## 2023-08-31 ENCOUNTER — Other Ambulatory Visit: Payer: Self-pay

## 2023-08-31 ENCOUNTER — Emergency Department (HOSPITAL_COMMUNITY)
Admission: EM | Admit: 2023-08-31 | Discharge: 2023-08-31 | Disposition: A | Discharge status: Home / Self Care | Payer: Medicare Other | Attending: Emergency Medicine | Admitting: Emergency Medicine

## 2023-08-31 DIAGNOSIS — Z20822 Contact with and (suspected) exposure to covid-19: Secondary | ICD-10-CM | POA: Diagnosis not present

## 2023-08-31 DIAGNOSIS — Z79899 Other long term (current) drug therapy: Secondary | ICD-10-CM | POA: Insufficient documentation

## 2023-08-31 DIAGNOSIS — Z7984 Long term (current) use of oral hypoglycemic drugs: Secondary | ICD-10-CM | POA: Diagnosis not present

## 2023-08-31 DIAGNOSIS — R072 Precordial pain: Secondary | ICD-10-CM | POA: Diagnosis present

## 2023-08-31 DIAGNOSIS — B9789 Other viral agents as the cause of diseases classified elsewhere: Secondary | ICD-10-CM | POA: Diagnosis not present

## 2023-08-31 DIAGNOSIS — R051 Acute cough: Secondary | ICD-10-CM

## 2023-08-31 DIAGNOSIS — R062 Wheezing: Secondary | ICD-10-CM

## 2023-08-31 DIAGNOSIS — J069 Acute upper respiratory infection, unspecified: Secondary | ICD-10-CM | POA: Diagnosis not present

## 2023-08-31 LAB — CBC
HCT: 38.1 % (ref 36.0–46.0)
Hemoglobin: 12.3 g/dL (ref 12.0–15.0)
MCH: 28.3 pg (ref 26.0–34.0)
MCHC: 32.3 g/dL (ref 30.0–36.0)
MCV: 87.6 fL (ref 80.0–100.0)
Platelets: 226 10*3/uL (ref 150–400)
RBC: 4.35 MIL/uL (ref 3.87–5.11)
RDW: 16.1 % — ABNORMAL HIGH (ref 11.5–15.5)
WBC: 7.5 10*3/uL (ref 4.0–10.5)
nRBC: 0 % (ref 0.0–0.2)

## 2023-08-31 LAB — RESP PANEL BY RT-PCR (RSV, FLU A&B, COVID)  RVPGX2
Influenza A by PCR: NEGATIVE
Influenza B by PCR: NEGATIVE
Resp Syncytial Virus by PCR: NEGATIVE
SARS Coronavirus 2 by RT PCR: NEGATIVE

## 2023-08-31 LAB — BASIC METABOLIC PANEL
Anion gap: 10 (ref 5–15)
BUN: 8 mg/dL (ref 6–20)
CO2: 24 mmol/L (ref 22–32)
Calcium: 9.1 mg/dL (ref 8.9–10.3)
Chloride: 104 mmol/L (ref 98–111)
Creatinine, Ser: 0.62 mg/dL (ref 0.44–1.00)
GFR, Estimated: >60 mL/min (ref >60)
Glucose, Bld: 115 mg/dL — ABNORMAL HIGH (ref 70–99)
Potassium: 3.6 mmol/L (ref 3.5–5.1)
Sodium: 138 mmol/L (ref 135–145)

## 2023-08-31 LAB — GROUP A STREP BY PCR: Group A Strep by PCR: NOT DETECTED

## 2023-08-31 LAB — SARS CORONAVIRUS 2 BY RT PCR: SARS Coronavirus 2 by RT PCR: NEGATIVE

## 2023-08-31 LAB — TROPONIN I (HIGH SENSITIVITY): Troponin I (High Sensitivity): 4 ng/L (ref <18)

## 2023-08-31 MED ORDER — ALBUTEROL SULFATE HFA 108 (90 BASE) MCG/ACT IN AERS
2.0000 | INHALATION_SPRAY | Freq: Once | RESPIRATORY_TRACT | Status: AC
  Administered 2023-08-31: 2 via RESPIRATORY_TRACT
  Filled 2023-08-31: qty 6.7

## 2023-08-31 NOTE — ED Triage Notes (Signed)
Pt. Stated, I've had chest pain, SOB, cough, congestion for a week and trying to self medicate.

## 2023-08-31 NOTE — Discharge Instructions (Addendum)
Your history, exam, workup today was overall reassuring.  Your x-ray did not show pneumonia and the viral testing was negative for COVID/flu/RSV.  Your strep test was negative.  The rest of your workup was reassuring.  Your vital signs remained reassuring with normal oxygen saturations.  After the inhaler, your wheezing improved.  We feel you are safe for discharge home but do and you to see your primary doctor soon as possible.  Please rest and stay hydrated and use inhaler.  If any symptoms change or worsen acutely, return to the nearest emergency department.

## 2023-08-31 NOTE — ED Provider Notes (Signed)
[facility] AT [facility] Provider Note   CSN: [id number] Arrival date & time: 08/31/23  4098     History  Chief Complaint  Patient presents with   Chest Pain   Shortness of Breath   Cough   Nasal Congestion   Sore Throat    [name] is a 42 y.o. female.  The history is provided by the patient and medical records. No language interpreter was used.  Chest Pain Pain location:  Substernal area (tightness with coughing) Pain quality: tightness   Pain radiates to:  Does not radiate Pain severity:  Mild Onset quality:  Gradual Duration:  1 week Timing:  Intermittent Progression:  Waxing and waning Chronicity:  New Relieved by:  Nothing Worsened by:  Coughing Ineffective treatments:  None tried Associated symptoms: cough, fatigue and shortness of breath   Associated symptoms: no abdominal pain, no back pain, no diaphoresis, no headache, no lower extremity edema, no nausea, no palpitations and no vomiting  Fever: did no check her temp at home. Shortness of Breath Associated symptoms: chest pain, cough and sore throat   Associated symptoms: no abdominal pain, no diaphoresis, no headaches, no neck pain, no rash, no vomiting and no wheezing  Fever: did no check her temp at home. Cough Cough characteristics:  Productive Sputum characteristics:  Manson Passey Severity:  Moderate Onset quality:  Gradual Duration:  1 week Timing:  Constant Progression:  Waxing and waning Associated symptoms: chest pain, chills, shortness of breath and sore throat   Associated symptoms: no diaphoresis, no headaches, no rash and no wheezing  Fever: did no check her temp at home. Sore Throat Associated symptoms include chest pain and shortness of breath. Pertinent negatives include no abdominal pain and no headaches.       Home Medications Prior to Admission medications   Medication Sig Start Date End Date Taking? Authorizing Provider  cyclobenzaprine  (FLEXERIL) 10 MG tablet Take 1 tablet (10 mg total) by mouth 3 (three) times daily as needed for muscle spasms. 01/02/22   Barnett Abu, MD  empagliflozin (JARDIANCE) 10 MG TABS tablet Take 1 tablet (10 mg total) by mouth daily. 04/15/20   Arlyce Harman, MD  FLUoxetine (PROZAC) 40 MG capsule Take 1 capsule (40 mg total) by mouth daily. 01/10/22   Dameron, Nolberto Hanlon, DO  lidocaine (LIDODERM) 5 % Place 1 patch onto the skin daily. Remove & Discard patch within 12 hours or as directed by MD Patient not taking: Reported on 12/17/2021 07/05/21   Sloan Leiter, DO  losartan-hydrochlorothiazide (HYZAAR) 100-12.5 MG tablet Take 1 tablet by mouth daily. 11/03/21   Chambliss, Estill Batten, MD  nicotine (NICODERM CQ - DOSED IN MG/24 HOURS) 21 mg/24hr patch 21 mg daily. 08/17/22   [provider]  nicotine polacrilex (NICORETTE) 2 MG gum SMARTSIG:1 gum By Mouth Every 2 Hours PRN 08/18/22   [provider]  rosuvastatin (CRESTOR) 10 MG tablet Take 10 mg by mouth daily. 09/02/22   [provider]  SUMAtriptan (IMITREX) 50 MG tablet Take 1 tablet (50 mg total) by mouth every 2 (two) hours as needed for migraine. May repeat in 2 hours if headache persists or recurs. 12/12/20   Maness, Loistine Chance, MD      Allergies    Bee venom, Penicillins, Mushroom extract complex, Ciprofloxacin, and Powder    Review of Systems   Review of Systems  Constitutional:  Positive for chills and fatigue. Negative for diaphoresis. Fever: did no check her temp at  home. HENT:  Positive for congestion and sore throat.   Eyes:  Negative for visual disturbance.  Respiratory:  Positive for cough, chest tightness and shortness of breath. Negative for wheezing.   Cardiovascular:  Positive for chest pain. Negative for palpitations and leg swelling.  Gastrointestinal:  Negative for abdominal pain, constipation, diarrhea, nausea and vomiting.  Genitourinary:  Negative for dysuria and flank pain.  Musculoskeletal:  Negative for  back pain, neck pain and neck stiffness.  Skin:  Negative for rash and wound.  Neurological:  Negative for headaches.  Psychiatric/Behavioral:  Negative for agitation.   All other systems reviewed and are negative.   Physical Exam Updated Vital Signs Temp 98.3 F (36.8 C) (Oral)   LMP 09/11/2017 (Approximate)  Physical Exam Vitals and nursing note reviewed.  Constitutional:      General: She is not in acute distress.    Appearance: She is well-developed. She is not ill-appearing, toxic-appearing or diaphoretic.  HENT:     Head: Normocephalic and atraumatic.  Eyes:     Conjunctiva/sclera: Conjunctivae normal.     Pupils: Pupils are equal, round, and reactive to light.  Cardiovascular:     Rate and Rhythm: Normal rate and regular rhythm.     Heart sounds: Normal heart sounds. No murmur heard. Pulmonary:     Effort: Pulmonary effort is normal. No respiratory distress.     Breath sounds: Wheezing and rhonchi present.  Chest:     Chest wall: Tenderness present.  Abdominal:     Palpations: Abdomen is soft.     Tenderness: There is no abdominal tenderness.  Musculoskeletal:        General: No swelling.     Cervical back: Neck supple.     Right lower leg: No tenderness. No edema.     Left lower leg: No edema.  Skin:    General: Skin is warm and dry.     Capillary Refill: Capillary refill takes less than 2 seconds.  Neurological:     General: No focal deficit present.     Mental Status: She is alert.  Psychiatric:        Mood and Affect: Mood normal.     ED Results / Procedures / Treatments   Labs (all labs ordered are listed, but only abnormal results are displayed) Labs Reviewed  BASIC METABOLIC PANEL - Abnormal; Notable for the following components:      Result Value   Glucose, Bld 115 (*)    All other components within normal limits  CBC - Abnormal; Notable for the following components:   RDW 16.1 (*)    All other components within normal limits  GROUP A STREP  BY PCR  SARS CORONAVIRUS 2 BY RT PCR  RESP PANEL BY RT-PCR (RSV, FLU A&B, COVID)  [id number]  TROPONIN I (HIGH SENSITIVITY)  TROPONIN I (HIGH SENSITIVITY)    EKG EKG Interpretation Date/Time:  Wednesday August 31 2023 08:19:26 EDT Ventricular Rate:  83 PR Interval:  147 QRS Duration:  83 QT Interval:  366 QTC Calculation: 430 R Axis:   17  Text Interpretation: Sinus rhythm Low voltage, precordial leads when compared to prior, overall similar appearance. No STEMI Confirmed by Theda Belfast ([id number]) on 08/31/2023 8:41:54 AM  Radiology DG Chest Portable 1 View  Result Date: 08/31/2023 CLINICAL DATA:  Cough. Shortness of breath. Chills. Chest tightness. EXAM: PORTABLE CHEST 1 VIEW COMPARISON:  01/21/2018. FINDINGS: Bilateral lung fields are clear. Bilateral lateral costophrenic angles are clear. Normal cardio-mediastinal silhouette. No acute  osseous abnormalities. The soft tissues are within normal limits. IMPRESSION: *No active disease. Electronically Signed   By: Jules Schick M.D.   On: 08/31/2023 10:13    Procedures Procedures    Medications Ordered in ED Medications  albuterol (VENTOLIN HFA) 108 (90 Base) MCG/ACT inhaler 2 puff (2 puffs Inhalation Given 08/31/23 0956)    ED Course/ Medical Decision Making/ A&P                                 Medical Decision Making Amount and/or Complexity of Data Reviewed Labs: ordered. Radiology: ordered.  Risk Prescription drug management.    [name] is a 42 y.o. female diabetes, peripheral neuropathy, hypertension, anxiety, hyperlipidemia, arthritis, and GERD who presents with 1 week of chills, sore throat, shortness of breath, chest tightness, and productive cough.  According to patient, she says it is about a week and is not any better with home medications.  She denies any sick contacts and did not get her flu shot like her body else did.  She is reporting some chest tightness and productive cough with brown sputum.   She initially denied chest pain but then reports some chest tightness with coughing.  Denies any nausea, vomiting, constipation, diarrhea, or urinary changes.  Denies extremity pains at this time.  On exam, lungs did have some wheezing and coarseness.  She denies history of asthma.  Chest was tender to palpation and back was nontender.  Abdomen nontender.  Normal bowel sounds.  Good pulses in extremities.  Vital signs reassuring on my initial assessment.  She reports has not taken her temperature but has felt hot and cold this week.  EKG does not show STEMI.  Clinical aspect could have a viral illness versus pneumonia.  Will give her 2 puffs albuterol to see if this helps with the wheezing and we will see what chest x-ray and labs show.  Will order a viral swab for COVID/flu/RSV.  It appears just the COVID swab was ordered in triage so we will try to add on the flu test.  Will get screening labs as well.  Anticipate reassessment after workup to determine disposition.  12:17 PM Patient's workup continues to return.  X-ray does not show pneumonia.  Viral testing negative.  Wheezing improved after inhaler.  Patient reports he does not want any steroids as she does not do well with them.  Rest of workup also reassuring.  I suspect a viral infection in the setting of environmental changes triggered some reactive airway troubles.  Patient agrees with going home with the inhaler but does not want steroids.  She will follow-up with her primary doctor as soon as possible.  She had no other questions or concerns and agrees with discharge.  Patient discharged in good condition with reassuring vital signs.         Final Clinical Impression(s) / ED Diagnoses Final diagnoses:  Upper respiratory tract infection, unspecified type  Acute cough  Viral URI with cough  Wheezing    Rx / DC Orders ED Discharge Orders     None      Clinical Impression: 1. Upper respiratory tract infection, unspecified  type   2. Acute cough   3. Viral URI with cough   4. Wheezing     Disposition: Discharge  Condition: Good  I have discussed the results, Dx and Tx plan with the pt(& family if present). He/she/they expressed understanding  and agree(s) with the plan. Discharge instructions discussed at great length. Strict return precautions discussed and pt &/or family have verbalized understanding of the instructions. No further questions at time of discharge.    New Prescriptions   No medications on file    Follow Up: [facility], [address] [place] [address] [phone]     [facility] [address] [place] [address] [phone]       Kabao Leite, Canary Brim, MD 08/31/23 [phone]
